# Patient Record
Sex: Female | Born: 1945 | Race: Black or African American | Hispanic: No | Smoking: Former smoker
Health system: Southern US, Community
[De-identification: ages and names within clinical notes are randomized; demographics above are authoritative.]

## PROBLEM LIST (undated history)

## (undated) DIAGNOSIS — F32A Depression, unspecified: Secondary | ICD-10-CM

## (undated) DIAGNOSIS — F329 Major depressive disorder, single episode, unspecified: Secondary | ICD-10-CM

## (undated) DIAGNOSIS — F419 Anxiety disorder, unspecified: Secondary | ICD-10-CM

## (undated) DIAGNOSIS — I10 Essential (primary) hypertension: Secondary | ICD-10-CM

## (undated) DIAGNOSIS — M1990 Unspecified osteoarthritis, unspecified site: Secondary | ICD-10-CM

## (undated) DIAGNOSIS — G459 Transient cerebral ischemic attack, unspecified: Secondary | ICD-10-CM

## (undated) HISTORY — DX: Transient cerebral ischemic attack, unspecified: G45.9

## (undated) HISTORY — PX: EYE SURGERY: SHX253

## (undated) HISTORY — DX: Essential (primary) hypertension: I10

## (undated) HISTORY — PX: OTHER SURGICAL HISTORY: SHX169

## (undated) HISTORY — DX: Morbid (severe) obesity due to excess calories: E66.01

## (undated) HISTORY — PX: ORIF ANKLE FRACTURE: SUR919

## (undated) HISTORY — DX: Unspecified osteoarthritis, unspecified site: M19.90

## (undated) HISTORY — DX: Major depressive disorder, single episode, unspecified: F32.9

## (undated) HISTORY — DX: Depression, unspecified: F32.A

---

## 1999-04-19 ENCOUNTER — Inpatient Hospital Stay (HOSPITAL_COMMUNITY): Admission: EM | Admit: 1999-04-19 | Discharge: 1999-04-22 | Payer: Self-pay | Admitting: Emergency Medicine

## 1999-04-20 ENCOUNTER — Encounter: Payer: Self-pay | Admitting: Internal Medicine

## 2001-05-21 ENCOUNTER — Inpatient Hospital Stay (HOSPITAL_COMMUNITY): Admission: EM | Admit: 2001-05-21 | Discharge: 2001-05-23 | Payer: Self-pay | Admitting: *Deleted

## 2001-05-21 ENCOUNTER — Encounter: Payer: Self-pay | Admitting: Internal Medicine

## 2001-05-23 ENCOUNTER — Encounter: Payer: Self-pay | Admitting: Cardiology

## 2002-05-11 ENCOUNTER — Emergency Department (HOSPITAL_COMMUNITY): Admission: EM | Admit: 2002-05-11 | Discharge: 2002-05-11 | Payer: Self-pay | Admitting: Emergency Medicine

## 2002-05-12 ENCOUNTER — Encounter: Payer: Self-pay | Admitting: Emergency Medicine

## 2003-02-06 ENCOUNTER — Encounter: Payer: Self-pay | Admitting: Family Medicine

## 2003-02-06 ENCOUNTER — Ambulatory Visit (HOSPITAL_COMMUNITY): Admission: RE | Admit: 2003-02-06 | Discharge: 2003-02-06 | Payer: Self-pay | Admitting: Family Medicine

## 2003-07-01 ENCOUNTER — Emergency Department (HOSPITAL_COMMUNITY): Admission: EM | Admit: 2003-07-01 | Discharge: 2003-07-01 | Payer: Self-pay | Admitting: *Deleted

## 2004-08-30 ENCOUNTER — Ambulatory Visit: Payer: Self-pay | Admitting: *Deleted

## 2004-09-13 ENCOUNTER — Inpatient Hospital Stay (HOSPITAL_COMMUNITY): Admission: EM | Admit: 2004-09-13 | Discharge: 2004-09-14 | Payer: Self-pay | Admitting: Emergency Medicine

## 2004-09-17 ENCOUNTER — Ambulatory Visit: Payer: Self-pay | Admitting: Family Medicine

## 2005-03-01 ENCOUNTER — Ambulatory Visit: Payer: Self-pay | Admitting: Family Medicine

## 2005-03-22 ENCOUNTER — Ambulatory Visit: Payer: Self-pay | Admitting: Family Medicine

## 2005-03-23 ENCOUNTER — Emergency Department (HOSPITAL_COMMUNITY): Admission: EM | Admit: 2005-03-23 | Discharge: 2005-03-23 | Payer: Self-pay | Admitting: Emergency Medicine

## 2005-06-21 ENCOUNTER — Ambulatory Visit: Payer: Self-pay | Admitting: Family Medicine

## 2005-07-21 ENCOUNTER — Ambulatory Visit: Payer: Self-pay | Admitting: Family Medicine

## 2005-09-25 ENCOUNTER — Emergency Department (HOSPITAL_COMMUNITY): Admission: EM | Admit: 2005-09-25 | Discharge: 2005-09-26 | Payer: Self-pay | Admitting: Emergency Medicine

## 2005-10-14 ENCOUNTER — Ambulatory Visit: Payer: Self-pay | Admitting: Family Medicine

## 2005-10-26 ENCOUNTER — Ambulatory Visit: Payer: Self-pay | Admitting: Family Medicine

## 2006-04-22 ENCOUNTER — Emergency Department (HOSPITAL_COMMUNITY): Admission: EM | Admit: 2006-04-22 | Discharge: 2006-04-22 | Payer: Self-pay | Admitting: Emergency Medicine

## 2006-08-02 ENCOUNTER — Ambulatory Visit: Payer: Self-pay | Admitting: Cardiology

## 2006-08-02 ENCOUNTER — Encounter: Payer: Self-pay | Admitting: Cardiology

## 2006-08-02 ENCOUNTER — Inpatient Hospital Stay (HOSPITAL_COMMUNITY): Admission: EM | Admit: 2006-08-02 | Discharge: 2006-08-05 | Payer: Self-pay | Admitting: Emergency Medicine

## 2006-09-14 ENCOUNTER — Ambulatory Visit: Payer: Self-pay | Admitting: Internal Medicine

## 2006-09-14 ENCOUNTER — Encounter (INDEPENDENT_AMBULATORY_CARE_PROVIDER_SITE_OTHER): Payer: Self-pay | Admitting: Unknown Physician Specialty

## 2006-09-14 LAB — CONVERTED CEMR LAB
BUN: 7 mg/dL (ref 6–23)
CO2: 25 meq/L (ref 19–32)
Calcium: 9.3 mg/dL (ref 8.4–10.5)
Chloride: 100 meq/L (ref 96–112)
Creatinine, Ser: 0.7 mg/dL (ref 0.40–1.20)
Glucose, Bld: 124 mg/dL — ABNORMAL HIGH (ref 70–99)
Magnesium: 1.5 mg/dL (ref 1.5–2.5)
Potassium: 3.8 meq/L (ref 3.5–5.3)
Sodium: 142 meq/L (ref 135–145)

## 2006-09-27 ENCOUNTER — Encounter (INDEPENDENT_AMBULATORY_CARE_PROVIDER_SITE_OTHER): Payer: Self-pay | Admitting: Unknown Physician Specialty

## 2006-09-27 ENCOUNTER — Ambulatory Visit: Payer: Self-pay | Admitting: Hospitalist

## 2006-09-27 LAB — CONVERTED CEMR LAB
BUN: 11 mg/dL (ref 6–23)
CO2: 31 meq/L (ref 19–32)
Calcium: 9.3 mg/dL (ref 8.4–10.5)
Chloride: 100 meq/L (ref 96–112)
Creatinine, Ser: 0.64 mg/dL (ref 0.40–1.20)
Creatinine, Urine: 134.9 mg/dL
Ferritin: 47 ng/mL (ref 10–291)
Glucose, Bld: 96 mg/dL (ref 70–99)
Iron: 101 ug/dL (ref 42–145)
Microalb Creat Ratio: 8.4 mg/g (ref 0.0–30.0)
Microalb, Ur: 1.13 mg/dL (ref 0.00–1.89)
Potassium: 3.9 meq/L (ref 3.5–5.3)
Saturation Ratios: 31 % (ref 20–55)
Sodium: 140 meq/L (ref 135–145)
TIBC: 321 ug/dL (ref 250–470)
UIBC: 220 ug/dL

## 2006-12-27 ENCOUNTER — Encounter (INDEPENDENT_AMBULATORY_CARE_PROVIDER_SITE_OTHER): Payer: Self-pay | Admitting: Unknown Physician Specialty

## 2006-12-27 ENCOUNTER — Ambulatory Visit: Payer: Self-pay | Admitting: Internal Medicine

## 2006-12-27 DIAGNOSIS — E119 Type 2 diabetes mellitus without complications: Secondary | ICD-10-CM | POA: Insufficient documentation

## 2006-12-27 DIAGNOSIS — Z6841 Body Mass Index (BMI) 40.0 and over, adult: Secondary | ICD-10-CM

## 2006-12-27 DIAGNOSIS — E118 Type 2 diabetes mellitus with unspecified complications: Secondary | ICD-10-CM | POA: Insufficient documentation

## 2006-12-27 DIAGNOSIS — I428 Other cardiomyopathies: Secondary | ICD-10-CM | POA: Insufficient documentation

## 2006-12-27 LAB — CONVERTED CEMR LAB
ALT: 13 units/L (ref 0–35)
AST: 17 units/L (ref 0–37)
Albumin: 4.5 g/dL (ref 3.5–5.2)
Aldosterone, Serum: 11
Alkaline Phosphatase: 45 units/L (ref 39–117)
Amphetamine Screen, Ur: NEGATIVE
BUN: 11 mg/dL (ref 6–23)
Barbiturate Quant, Ur: NEGATIVE
Benzodiazepines.: NEGATIVE
CO2: 24 meq/L (ref 19–32)
Calcium: 9.4 mg/dL (ref 8.4–10.5)
Chloride: 100 meq/L (ref 96–112)
Cholesterol: 211 mg/dL — ABNORMAL HIGH (ref 0–200)
Cocaine Metabolites: NEGATIVE
Creatinine, Ser: 0.63 mg/dL (ref 0.40–1.20)
Creatinine,U: 107.2 mg/dL
Glucose, Bld: 152 mg/dL
Glucose, Bld: 135 mg/dL — ABNORMAL HIGH (ref 70–99)
HDL: 104 mg/dL (ref >39)
Hgb A1c MFr Bld: 7 %
LDL Cholesterol: 62 mg/dL (ref 0–99)
Magnesium: 1.6 mg/dL (ref 1.5–2.5)
Marijuana Metabolite: NEGATIVE
Methadone: NEGATIVE
Opiates: NEGATIVE
PRA: 0.2
Phencyclidine (PCP): NEGATIVE
Potassium: 3.6 meq/L (ref 3.5–5.3)
Propoxyphene: NEGATIVE
Sodium: 141 meq/L (ref 135–145)
Total Bilirubin: 0.7 mg/dL (ref 0.3–1.2)
Total CHOL/HDL Ratio: 2
Total Protein: 7.7 g/dL (ref 6.0–8.3)
Triglycerides: 226 mg/dL — ABNORMAL HIGH (ref <150)
VLDL: 45 mg/dL — ABNORMAL HIGH (ref 0–40)

## 2006-12-28 ENCOUNTER — Telehealth: Payer: Self-pay | Admitting: *Deleted

## 2007-02-08 ENCOUNTER — Telehealth: Payer: Self-pay | Admitting: *Deleted

## 2007-02-12 ENCOUNTER — Ambulatory Visit: Payer: Self-pay | Admitting: Hospitalist

## 2007-02-12 ENCOUNTER — Encounter (INDEPENDENT_AMBULATORY_CARE_PROVIDER_SITE_OTHER): Payer: Self-pay | Admitting: Unknown Physician Specialty

## 2007-02-12 LAB — CONVERTED CEMR LAB
Blood Glucose, Fingerstick: 101
Testosterone: 61.87 ng/dL (ref 10–70)

## 2007-05-09 ENCOUNTER — Telehealth (INDEPENDENT_AMBULATORY_CARE_PROVIDER_SITE_OTHER): Payer: Self-pay | Admitting: *Deleted

## 2007-05-11 ENCOUNTER — Emergency Department (HOSPITAL_COMMUNITY): Admission: EM | Admit: 2007-05-11 | Discharge: 2007-05-12 | Payer: Self-pay | Admitting: Emergency Medicine

## 2007-05-14 ENCOUNTER — Ambulatory Visit: Payer: Self-pay | Admitting: Hospitalist

## 2007-05-14 ENCOUNTER — Encounter (INDEPENDENT_AMBULATORY_CARE_PROVIDER_SITE_OTHER): Payer: Self-pay | Admitting: *Deleted

## 2007-05-14 LAB — CONVERTED CEMR LAB
Blood Glucose, Fingerstick: 210
Hgb A1c MFr Bld: 7 %
TSH: 1.435 microintl units/mL (ref 0.350–5.50)

## 2007-06-06 ENCOUNTER — Telehealth (INDEPENDENT_AMBULATORY_CARE_PROVIDER_SITE_OTHER): Payer: Self-pay | Admitting: Pharmacy Technician

## 2007-06-06 ENCOUNTER — Encounter (INDEPENDENT_AMBULATORY_CARE_PROVIDER_SITE_OTHER): Payer: Self-pay | Admitting: *Deleted

## 2007-06-06 ENCOUNTER — Ambulatory Visit: Payer: Self-pay

## 2007-07-09 ENCOUNTER — Encounter (INDEPENDENT_AMBULATORY_CARE_PROVIDER_SITE_OTHER): Payer: Self-pay | Admitting: *Deleted

## 2007-07-09 ENCOUNTER — Ambulatory Visit: Payer: Self-pay | Admitting: Internal Medicine

## 2007-07-09 LAB — CONVERTED CEMR LAB
ALT: 12 units/L (ref 0–35)
AST: 13 units/L (ref 0–37)
Albumin: 4.5 g/dL (ref 3.5–5.2)
Alkaline Phosphatase: 42 units/L (ref 39–117)
BUN: 16 mg/dL (ref 6–23)
Blood Glucose, Fingerstick: 126
CO2: 26 meq/L (ref 19–32)
Calcium: 10.1 mg/dL (ref 8.4–10.5)
Chloride: 102 meq/L (ref 96–112)
Creatinine, Ser: 0.85 mg/dL (ref 0.40–1.20)
Glucose, Bld: 97 mg/dL (ref 70–99)
Magnesium: 1.9 mg/dL (ref 1.5–2.5)
Potassium: 4.1 meq/L (ref 3.5–5.3)
Sodium: 143 meq/L (ref 135–145)
Total Bilirubin: 0.5 mg/dL (ref 0.3–1.2)
Total Protein: 7.6 g/dL (ref 6.0–8.3)

## 2007-07-13 ENCOUNTER — Ambulatory Visit (HOSPITAL_COMMUNITY): Admission: RE | Admit: 2007-07-13 | Discharge: 2007-07-13 | Payer: Self-pay | Admitting: *Deleted

## 2007-07-17 DIAGNOSIS — F1020 Alcohol dependence, uncomplicated: Secondary | ICD-10-CM | POA: Insufficient documentation

## 2007-08-07 ENCOUNTER — Telehealth: Payer: Self-pay | Admitting: *Deleted

## 2007-09-04 ENCOUNTER — Telehealth: Payer: Self-pay | Admitting: *Deleted

## 2007-09-10 ENCOUNTER — Telehealth: Payer: Self-pay | Admitting: *Deleted

## 2007-11-07 ENCOUNTER — Telehealth: Payer: Self-pay | Admitting: Internal Medicine

## 2008-01-02 ENCOUNTER — Telehealth: Payer: Self-pay | Admitting: Internal Medicine

## 2008-01-03 ENCOUNTER — Emergency Department (HOSPITAL_COMMUNITY): Admission: EM | Admit: 2008-01-03 | Discharge: 2008-01-04 | Payer: Self-pay | Admitting: Emergency Medicine

## 2008-05-21 ENCOUNTER — Encounter (INDEPENDENT_AMBULATORY_CARE_PROVIDER_SITE_OTHER): Payer: Self-pay | Admitting: Internal Medicine

## 2008-05-21 ENCOUNTER — Ambulatory Visit: Payer: Self-pay | Admitting: Internal Medicine

## 2008-05-21 LAB — CONVERTED CEMR LAB
BUN: 12 mg/dL (ref 6–23)
Bilirubin Urine: NEGATIVE
Blood Glucose, Fingerstick: 74
Blood in Urine, dipstick: NEGATIVE
CO2: 26 meq/L (ref 19–32)
Calcium: 9.7 mg/dL (ref 8.4–10.5)
Chloride: 100 meq/L (ref 96–112)
Creatinine, Ser: 0.56 mg/dL (ref 0.40–1.20)
Glucose, Bld: 107 mg/dL — ABNORMAL HIGH (ref 70–99)
Glucose, Urine, Semiquant: NEGATIVE
Hgb A1c MFr Bld: 7.2 %
Ketones, urine, test strip: NEGATIVE
Nitrite: NEGATIVE
Potassium: 4 meq/L (ref 3.5–5.3)
Protein, U semiquant: NEGATIVE
Sodium: 141 meq/L (ref 135–145)
Specific Gravity, Urine: >=1.03
Urobilinogen, UA: 0.2
pH: 6

## 2008-06-24 ENCOUNTER — Telehealth: Payer: Self-pay | Admitting: *Deleted

## 2008-07-20 ENCOUNTER — Emergency Department (HOSPITAL_COMMUNITY): Admission: EM | Admit: 2008-07-20 | Discharge: 2008-07-20 | Payer: Self-pay | Admitting: Emergency Medicine

## 2008-09-22 ENCOUNTER — Telehealth (INDEPENDENT_AMBULATORY_CARE_PROVIDER_SITE_OTHER): Payer: Self-pay | Admitting: Internal Medicine

## 2008-10-13 ENCOUNTER — Ambulatory Visit: Payer: Self-pay | Admitting: Internal Medicine

## 2008-10-13 DIAGNOSIS — M25579 Pain in unspecified ankle and joints of unspecified foot: Secondary | ICD-10-CM | POA: Insufficient documentation

## 2008-10-13 LAB — CONVERTED CEMR LAB
Blood Glucose, Fingerstick: 141
Hgb A1c MFr Bld: 6.6 %

## 2008-10-27 ENCOUNTER — Telehealth: Payer: Self-pay | Admitting: Infectious Diseases

## 2008-11-01 ENCOUNTER — Encounter: Payer: Self-pay | Admitting: Emergency Medicine

## 2008-11-02 ENCOUNTER — Inpatient Hospital Stay (HOSPITAL_COMMUNITY): Admission: EM | Admit: 2008-11-02 | Discharge: 2008-11-03 | Payer: Self-pay | Admitting: Internal Medicine

## 2008-11-02 ENCOUNTER — Ambulatory Visit: Payer: Self-pay | Admitting: Internal Medicine

## 2008-11-02 ENCOUNTER — Encounter: Payer: Self-pay | Admitting: *Deleted

## 2008-11-02 DIAGNOSIS — I498 Other specified cardiac arrhythmias: Secondary | ICD-10-CM | POA: Insufficient documentation

## 2008-11-02 DIAGNOSIS — J209 Acute bronchitis, unspecified: Secondary | ICD-10-CM | POA: Insufficient documentation

## 2008-11-02 DIAGNOSIS — E876 Hypokalemia: Secondary | ICD-10-CM | POA: Insufficient documentation

## 2008-11-04 ENCOUNTER — Encounter (INDEPENDENT_AMBULATORY_CARE_PROVIDER_SITE_OTHER): Payer: Self-pay | Admitting: Internal Medicine

## 2008-11-11 ENCOUNTER — Encounter: Payer: Self-pay | Admitting: *Deleted

## 2008-11-11 LAB — CONVERTED CEMR LAB: LDL Cholesterol: 103 mg/dL

## 2008-11-20 ENCOUNTER — Ambulatory Visit: Payer: Self-pay | Admitting: Infectious Disease

## 2008-11-20 ENCOUNTER — Encounter (INDEPENDENT_AMBULATORY_CARE_PROVIDER_SITE_OTHER): Payer: Self-pay | Admitting: Internal Medicine

## 2008-11-20 ENCOUNTER — Encounter (INDEPENDENT_AMBULATORY_CARE_PROVIDER_SITE_OTHER): Payer: Self-pay | Admitting: *Deleted

## 2008-11-20 DIAGNOSIS — E1169 Type 2 diabetes mellitus with other specified complication: Secondary | ICD-10-CM | POA: Insufficient documentation

## 2008-11-20 DIAGNOSIS — E785 Hyperlipidemia, unspecified: Secondary | ICD-10-CM

## 2008-11-20 LAB — CONVERTED CEMR LAB
BUN: 12 mg/dL (ref 6–23)
CO2: 24 meq/L (ref 19–32)
Calcium: 9.4 mg/dL (ref 8.4–10.5)
Chloride: 101 meq/L (ref 96–112)
Creatinine, Ser: 0.6 mg/dL (ref 0.40–1.20)
Glucose, Bld: 119 mg/dL — ABNORMAL HIGH (ref 70–99)
Potassium: 3.6 meq/L (ref 3.5–5.3)
Sodium: 142 meq/L (ref 135–145)

## 2008-12-05 ENCOUNTER — Telehealth: Payer: Self-pay | Admitting: *Deleted

## 2008-12-16 ENCOUNTER — Ambulatory Visit (HOSPITAL_COMMUNITY): Admission: RE | Admit: 2008-12-16 | Discharge: 2008-12-16 | Payer: Self-pay | Admitting: Infectious Disease

## 2008-12-16 ENCOUNTER — Encounter (INDEPENDENT_AMBULATORY_CARE_PROVIDER_SITE_OTHER): Payer: Self-pay | Admitting: Internal Medicine

## 2008-12-16 ENCOUNTER — Emergency Department (HOSPITAL_COMMUNITY): Admission: EM | Admit: 2008-12-16 | Discharge: 2008-12-16 | Payer: Self-pay | Admitting: Emergency Medicine

## 2008-12-29 ENCOUNTER — Telehealth (INDEPENDENT_AMBULATORY_CARE_PROVIDER_SITE_OTHER): Payer: Self-pay | Admitting: Internal Medicine

## 2008-12-30 ENCOUNTER — Emergency Department (HOSPITAL_COMMUNITY): Admission: EM | Admit: 2008-12-30 | Discharge: 2008-12-30 | Payer: Self-pay | Admitting: Emergency Medicine

## 2009-01-12 ENCOUNTER — Ambulatory Visit: Payer: Self-pay | Admitting: Internal Medicine

## 2009-01-12 ENCOUNTER — Ambulatory Visit (HOSPITAL_COMMUNITY): Admission: RE | Admit: 2009-01-12 | Discharge: 2009-01-12 | Payer: Self-pay | Admitting: Internal Medicine

## 2009-01-12 ENCOUNTER — Encounter (INDEPENDENT_AMBULATORY_CARE_PROVIDER_SITE_OTHER): Payer: Self-pay | Admitting: Internal Medicine

## 2009-01-12 LAB — CONVERTED CEMR LAB
ALT: 11 units/L (ref 0–35)
AST: 12 units/L (ref 0–37)
Albumin: 4.7 g/dL (ref 3.5–5.2)
Alkaline Phosphatase: 39 units/L (ref 39–117)
BUN: 12 mg/dL (ref 6–23)
Blood Glucose, Fingerstick: 152
CO2: 26 meq/L (ref 19–32)
Calcium: 9.6 mg/dL (ref 8.4–10.5)
Chloride: 99 meq/L (ref 96–112)
Cholesterol: 187 mg/dL (ref 0–200)
Creatinine, Ser: 0.66 mg/dL (ref 0.40–1.20)
Glucose, Bld: 147 mg/dL — ABNORMAL HIGH (ref 70–99)
HDL: 84 mg/dL (ref >39)
Hgb A1c MFr Bld: 8.2 %
LDL Cholesterol: 82 mg/dL (ref 0–99)
Potassium: 4 meq/L (ref 3.5–5.3)
Sodium: 142 meq/L (ref 135–145)
Total Bilirubin: 0.5 mg/dL (ref 0.3–1.2)
Total CHOL/HDL Ratio: 2.2
Total Protein: 7.4 g/dL (ref 6.0–8.3)
Triglycerides: 107 mg/dL (ref <150)
VLDL: 21 mg/dL (ref 0–40)

## 2009-01-14 ENCOUNTER — Encounter (INDEPENDENT_AMBULATORY_CARE_PROVIDER_SITE_OTHER): Payer: Self-pay | Admitting: Internal Medicine

## 2009-01-20 DIAGNOSIS — H409 Unspecified glaucoma: Secondary | ICD-10-CM | POA: Insufficient documentation

## 2009-02-05 ENCOUNTER — Telehealth (INDEPENDENT_AMBULATORY_CARE_PROVIDER_SITE_OTHER): Payer: Self-pay | Admitting: Internal Medicine

## 2009-02-26 ENCOUNTER — Encounter (INDEPENDENT_AMBULATORY_CARE_PROVIDER_SITE_OTHER): Payer: Self-pay | Admitting: Internal Medicine

## 2009-03-20 ENCOUNTER — Encounter (INDEPENDENT_AMBULATORY_CARE_PROVIDER_SITE_OTHER): Payer: Self-pay | Admitting: Internal Medicine

## 2009-04-08 ENCOUNTER — Encounter (INDEPENDENT_AMBULATORY_CARE_PROVIDER_SITE_OTHER): Payer: Self-pay | Admitting: Internal Medicine

## 2009-04-09 ENCOUNTER — Emergency Department (HOSPITAL_COMMUNITY): Admission: EM | Admit: 2009-04-09 | Discharge: 2009-04-09 | Payer: Self-pay | Admitting: Emergency Medicine

## 2009-05-09 ENCOUNTER — Emergency Department (HOSPITAL_COMMUNITY): Admission: EM | Admit: 2009-05-09 | Discharge: 2009-05-09 | Payer: Self-pay | Admitting: Emergency Medicine

## 2009-05-15 ENCOUNTER — Telehealth (INDEPENDENT_AMBULATORY_CARE_PROVIDER_SITE_OTHER): Payer: Self-pay | Admitting: Internal Medicine

## 2009-06-02 ENCOUNTER — Telehealth (INDEPENDENT_AMBULATORY_CARE_PROVIDER_SITE_OTHER): Payer: Self-pay | Admitting: Internal Medicine

## 2009-10-19 ENCOUNTER — Telehealth (INDEPENDENT_AMBULATORY_CARE_PROVIDER_SITE_OTHER): Payer: Self-pay | Admitting: Internal Medicine

## 2009-10-19 ENCOUNTER — Ambulatory Visit: Payer: Self-pay | Admitting: Internal Medicine

## 2009-10-19 LAB — CONVERTED CEMR LAB
Blood Glucose, Fingerstick: 80
Hgb A1c MFr Bld: 5.8 %

## 2010-02-23 ENCOUNTER — Telehealth (INDEPENDENT_AMBULATORY_CARE_PROVIDER_SITE_OTHER): Payer: Self-pay | Admitting: Internal Medicine

## 2010-03-30 ENCOUNTER — Telehealth (INDEPENDENT_AMBULATORY_CARE_PROVIDER_SITE_OTHER): Payer: Self-pay | Admitting: Internal Medicine

## 2010-04-04 ENCOUNTER — Ambulatory Visit: Payer: Self-pay | Admitting: Internal Medicine

## 2010-04-04 ENCOUNTER — Encounter: Payer: Self-pay | Admitting: Emergency Medicine

## 2010-04-04 ENCOUNTER — Encounter: Payer: Self-pay | Admitting: Internal Medicine

## 2010-04-04 ENCOUNTER — Ambulatory Visit: Payer: Self-pay | Admitting: Cardiovascular Disease

## 2010-04-04 ENCOUNTER — Inpatient Hospital Stay (HOSPITAL_COMMUNITY): Admission: EM | Admit: 2010-04-04 | Discharge: 2010-04-08 | Payer: Self-pay | Admitting: Internal Medicine

## 2010-04-05 ENCOUNTER — Encounter: Payer: Self-pay | Admitting: Internal Medicine

## 2010-04-05 LAB — CONVERTED CEMR LAB
Cholesterol: 182 mg/dL
HDL: 98 mg/dL
LDL Cholesterol: 73 mg/dL
Triglycerides: 54 mg/dL

## 2010-04-06 ENCOUNTER — Encounter: Payer: Self-pay | Admitting: Internal Medicine

## 2010-04-06 DIAGNOSIS — F341 Dysthymic disorder: Secondary | ICD-10-CM | POA: Insufficient documentation

## 2010-04-07 ENCOUNTER — Ambulatory Visit: Payer: Self-pay | Admitting: Surgery

## 2010-04-07 ENCOUNTER — Encounter: Payer: Self-pay | Admitting: Internal Medicine

## 2010-04-30 ENCOUNTER — Ambulatory Visit: Payer: Self-pay | Admitting: Internal Medicine

## 2010-04-30 LAB — CONVERTED CEMR LAB
BUN: 12 mg/dL (ref 6–23)
Blood Glucose, Fingerstick: 137
CO2: 28 meq/L (ref 19–32)
Calcium: 10.5 mg/dL (ref 8.4–10.5)
Chloride: 100 meq/L (ref 96–112)
Creatinine, Ser: 0.5 mg/dL (ref 0.40–1.20)
Glucose, Bld: 72 mg/dL (ref 70–99)
Potassium: 4.2 meq/L (ref 3.5–5.3)
Sodium: 141 meq/L (ref 135–145)

## 2010-05-12 ENCOUNTER — Ambulatory Visit: Payer: Self-pay | Admitting: Internal Medicine

## 2010-05-12 DIAGNOSIS — I471 Supraventricular tachycardia: Secondary | ICD-10-CM | POA: Insufficient documentation

## 2010-05-12 DIAGNOSIS — I152 Hypertension secondary to endocrine disorders: Secondary | ICD-10-CM | POA: Insufficient documentation

## 2010-05-12 DIAGNOSIS — E1159 Type 2 diabetes mellitus with other circulatory complications: Secondary | ICD-10-CM | POA: Insufficient documentation

## 2010-05-12 DIAGNOSIS — I10 Essential (primary) hypertension: Secondary | ICD-10-CM

## 2010-06-05 ENCOUNTER — Emergency Department (HOSPITAL_COMMUNITY): Admission: EM | Admit: 2010-06-05 | Discharge: 2010-06-05 | Payer: Self-pay | Admitting: Emergency Medicine

## 2010-06-05 ENCOUNTER — Ambulatory Visit (HOSPITAL_COMMUNITY): Admission: RE | Admit: 2010-06-05 | Discharge: 2010-06-05 | Payer: Self-pay | Admitting: Emergency Medicine

## 2010-06-25 ENCOUNTER — Encounter: Payer: Self-pay | Admitting: Internal Medicine

## 2010-07-26 LAB — HM DIABETES EYE EXAM: HM Diabetic Eye Exam: NORMAL

## 2010-08-04 ENCOUNTER — Telehealth: Payer: Self-pay | Admitting: Internal Medicine

## 2010-09-21 ENCOUNTER — Ambulatory Visit: Payer: Self-pay | Admitting: Internal Medicine

## 2010-09-21 LAB — HM DIABETES FOOT EXAM

## 2010-09-21 LAB — CONVERTED CEMR LAB
Blood Glucose, Fingerstick: 49
Creatinine, Urine: 90.6 mg/dL
Hgb A1c MFr Bld: 6.7 %
Microalb Creat Ratio: 7.7 mg/g (ref 0.0–30.0)
Microalb, Ur: 0.7 mg/dL (ref 0.00–1.89)

## 2010-09-30 ENCOUNTER — Encounter: Payer: Self-pay | Admitting: Internal Medicine

## 2010-10-04 ENCOUNTER — Ambulatory Visit (HOSPITAL_COMMUNITY)
Admission: RE | Admit: 2010-10-04 | Discharge: 2010-10-04 | Payer: Self-pay | Source: Home / Self Care | Admitting: Internal Medicine

## 2010-10-04 LAB — HM MAMMOGRAPHY

## 2010-10-05 ENCOUNTER — Encounter: Payer: Self-pay | Admitting: Internal Medicine

## 2010-10-22 ENCOUNTER — Ambulatory Visit: Payer: Self-pay | Admitting: Internal Medicine

## 2010-10-22 ENCOUNTER — Encounter: Payer: Self-pay | Admitting: Internal Medicine

## 2010-10-27 ENCOUNTER — Telehealth (INDEPENDENT_AMBULATORY_CARE_PROVIDER_SITE_OTHER): Payer: Self-pay | Admitting: *Deleted

## 2010-11-21 ENCOUNTER — Encounter: Payer: Self-pay | Admitting: Internal Medicine

## 2010-12-02 NOTE — Progress Notes (Signed)
Summary: med refill/gp  Phone Note Refill Request Message from:  Fax from Pharmacy on May 15, 2009 11:34 AM  Refills Requested: Medication #1:  HYDROCHLOROTHIAZIDE 25 MG TABS Take 1 tablet by mouth once a day   Last Refilled: 04/09/2009  Method Requested: Electronic Initial call taken by: Chinita Pester RN,  May 15, 2009 11:34 AM    Prescriptions: HYDROCHLOROTHIAZIDE 25 MG TABS (HYDROCHLOROTHIAZIDE) Take 1 tablet by mouth once a day  #30 x 6   Entered and Authorized by:   Elby Showers MD   Signed by:   Elby Showers MD on 05/16/2009   Method used:   Electronically to        [facility] [phone]* (retail)       [address]       [place], [place]  [address]       Ph: [phone]       Fax: [phone]   RxID:   [id number]

## 2010-12-02 NOTE — Miscellaneous (Signed)
  Clinical Lists Changes  Observations: Added new observation of DMEYEEXAMNXT: 08/2011 (09/30/2010 8:48) Added new observation of DIAB EYE EX: Normal exam. No retinopathy. (07/26/2010 8:49)      Diabetic Eye Exam  Procedure date:  07/26/2010  Findings:      Normal exam. No retinopathy.  Procedures Next Due Date:    Diabetic Eye Exam: 08/2011

## 2010-12-02 NOTE — Miscellaneous (Signed)
Summary: HIPPA  HIPPA   Imported By: Gentry Fitz 11/20/2008 15:48:15  _____________________________________________________________________  External Attachment:    Type:   Image     Comment:   External Document

## 2010-12-02 NOTE — Letter (Signed)
Summary: Handout Printed  Printed Handout:  - *Patient Instructions 

## 2010-12-02 NOTE — Miscellaneous (Signed)
  Clinical Lists Changes  Observations: Added new observation of MAMMO DUE: 10/2011 (10/05/2010 10:58) Added new observation of MAMMOGRAM: BI RADS 1.  (10/04/2010 10:58)      Mammogram  Procedure date:  10/04/2010  Findings:      BI RADS 1.   Procedures Next Due Date:    Mammogram: 10/2011

## 2010-12-02 NOTE — Assessment & Plan Note (Signed)
Summary: est-ck/fu/meds/cfb   Vital Signs:  Patient profile:   65 year old female Height:      62 inches (157.48 cm) Weight:      247.3 pounds (112.41 kg) BMI:     45.40 Temp:     97.9 degrees F (36.61 degrees C) oral Pulse rate:   88 / minute BP sitting:   177 / 112  (left arm)  Vitals Entered By: Stanton Kidney Ditzler RN (October 19, 2009 4:17 PM) Is Patient Diabetic? Yes Did you bring your meter with you today? No Pain Assessment Patient in pain? no      Nutritional Status BMI of > 30 = obese Nutritional Status Detail appetite good CBG Result 80  Have you ever been in a relationship where you felt threatened, hurt or afraid?denies   Does patient need assistance? Functional Status Self care Ambulation Normal Comments Ck-upBP reck 4:55PM 179/120 - 95 left arm.   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 2D echo in 3/07 showed Hypokinesis of apical aspect of inferior septum and mid apical inferior wall. AOCD - Ferritin 47, Iron 101, %saturation 31  She is here for a check up.  She has no complaints.  Her BP is very elvated today, she thinks this may be due to christmas stress.  No HA or vision change, no dizzyness, no chest pain.  Has recently started taking B12 500mg  to boost her energy.      Depression History:      The patient denies a depressed mood most of the day and a diminished interest in her usual daily activities.         Preventive Screening-Counseling & Management  Alcohol-Tobacco     Alcohol drinks/day: <1     Alcohol type: beer/wine     Smoking Status: quit     Smoking Cessation Counseling: yes     Packs/Day: Occ     Year Quit: 2010, approx Jan     Pack years: 27  Caffeine-Diet-Exercise     Does Patient Exercise: yes  Current Medications (verified): 1)   Catapres 0.1 Mg Tabs (Clonidine Hcl) .... Take 1 Tablet By Mouth  Twice A Day 2)  Hydrochlorothiazide 25 Mg Tabs (Hydrochlorothiazide) .... Take 1 Tablet By Mouth Once A Day 3)  Glipizide-Metformin Hcl 5-500 Mg Tabs (Glipizide-Metformin Hcl) .... Take 2 Tablets Twice A Day. 4)  Aspir-Low 81 Mg Tbec (Aspirin) .... Take 1 Tablet By Mouth Once A Day 5)  Norvasc 10 Mg  Tabs (Amlodipine Besylate) .... Take Once A Day For Blood Pressure  Allergies (verified): No Known Drug Allergies  Review of Systems       per hpi  Physical Exam  General:  alert and overweight-appearing.   Head:  normocephalic and atraumatic.   Eyes:  vision grossly intact.  right pupil distorted and nonconstricting.  left pupil is normal. Nose:  no external deformity.   Mouth:  pharynx pink and moist, poor dentition, and teeth missing.   Lungs:  normal respiratory effort and normal breath sounds.   Heart:  normal rate, regular rhythm, no murmur, and no gallop.   Pulses:  2+ Extremities:  no edema Neurologic:  alert & oriented X3, cranial nerves II-XII intact, strength normal in all extremities, sensation intact to pinprick, and gait normal.   Skin:  no suspicious lesions.   Cervical Nodes:  no anterior cervical adenopathy and no posterior cervical adenopathy.   Psych:  Oriented X3, memory intact for recent and remote, normally interactive, and good eye contact.    Diabetes Management Exam:    Foot Exam (with socks and/or shoes not present):       Sensory-Monofilament:          Left foot: normal          Right foot: normal   Impression & Recommendations:  Problem # 1:  Hx of ESSENTIAL HYPERTENSION (ICD-401.9) She reports taking her medications as prescribed.  BP very high today.   on recheck is the same. Will add lisinopril back to her regimen.  This was stopped in the past because of cough, which resolved.  She knows that if she developes cough again, she should stop the medicaiton and call so we can switch to an  ARB. rtc in 2 weeks for recheck.  Her updated medication list for this problem includes:    Catapres 0.1 Mg Tabs (Clonidine hcl) .Marland Kitchen... Take 1 tablet by mouth  twice a day    Hydrochlorothiazide 25 Mg Tabs (Hydrochlorothiazide) .Marland Kitchen... Take 1 tablet by mouth once a day    Norvasc 10 Mg Tabs (Amlodipine besylate) .Marland Kitchen... Take once a day for blood pressure    Lisinopril 20 Mg Tabs (Lisinopril) .Marland Kitchen... Take one tablet daily for blood pressure.  BP today: 177/112 Prior BP: 149/109 (01/12/2009)  Prior 10 Yr Risk Heart Disease: 20 % (02/12/2007)  Labs Reviewed: K+: 4.0 (01/12/2009) Creat: : 0.66 (01/12/2009)   Chol: 187 (01/12/2009)   HDL: 84 (01/12/2009)   LDL: 82 (01/12/2009)   TG: 107 (01/12/2009)  Problem # 2:  DM (ICD-250.00) A1C is great at 5.8.  will actually decrease the amout of glipizide/metformin and recheck in 3 months.  Her updated medication list for this problem includes:    Glipizide-metformin Hcl 5-500 Mg Tabs (Glipizide-metformin hcl) .Marland Kitchen... Take one tablet two times a day.    Aspir-low 81 Mg Tbec (Aspirin) .Marland Kitchen... Take 1 tablet by mouth once a day    Lisinopril 20 Mg Tabs (Lisinopril) .Marland Kitchen... Take one tablet daily for blood pressure.  Orders: T- Capillary Blood Glucose (82948) T-Hgb A1C (in-house) ([id number]) T-Urine Microalbumin w/creat. ratio [phone])  Labs Reviewed: Creat: 0.66 (01/12/2009)     Last Eye Exam: No diabetic retinopathy OU.   Glaucoma Suspect.   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  (01/07/2009) Reviewed HgBA1c results: 5.8 (10/19/2009)  8.2 (01/12/2009)  Problem # 3:  HYPERLIPIDEMIA (ICD-272.4) REcheck lipids and cmet at next visit.  The following medications were removed from the medication list:    Pravastatin Sodium 20 Mg Tabs (Pravastatin sodium) ..... One by mouth at bedtime  Labs Reviewed: SGOT: 12 (01/12/2009)   SGPT:  11 (01/12/2009)  Prior 10 Yr Risk Heart Disease: 20 % (02/12/2007)   HDL:84 (01/12/2009), 104 (36/64/4034)  LDL:82 (01/12/2009), 103  --  11/02/2008 (11/11/2008)  Chol:187 (01/12/2009), 211 (12/27/2006)  Trig:107 (01/12/2009), 226 (12/27/2006)  Complete Medication List: 1)  Catapres 0.1 Mg Tabs (Clonidine hcl) .... Take 1 tablet by mouth  twice a day 2)  Hydrochlorothiazide 25 Mg Tabs (Hydrochlorothiazide) .... Take 1 tablet by mouth once a day 3)  Glipizide-metformin Hcl 5-500 Mg Tabs (Glipizide-metformin hcl) .Marland KitchenMarland KitchenMarland Kitchen  Take one tablet two times a day. 4)  Aspir-low 81 Mg Tbec (Aspirin) .... Take 1 tablet by mouth once a day 5)  Norvasc 10 Mg Tabs (Amlodipine besylate) .... Take once a day for blood pressure 6)  Lisinopril 20 Mg Tabs (Lisinopril) .... Take one tablet daily for blood pressure.  Patient Instructions: 1)  Please schedule a follow-up appointment in 2 weeks. 2)  You have a new prescription for lisinopril for blood pressure. 3)  You can take one tablet of the metformin-glipizide two times a day. 4)  Limit your Sodium (Salt) to less than 4 grams a day (slightly less than 1 teaspoon) to prevent fluid retention, swelling, or worsening or symptoms. Prescriptions: GLIPIZIDE-METFORMIN HCL 5-500 MG TABS (GLIPIZIDE-METFORMIN HCL) Take one tablet two times a day.  #64 x 3   Entered and Authorized by:   Elby Showers MD   Signed by:   Elby Showers MD on 10/20/2009   Method used:   Electronically to        [facility] [phone]* (retail)       [address]       [place], [place]  [address]       Ph: [phone]       Fax: [phone]   RxID:   [id number] NORVASC 10 MG  TABS (AMLODIPINE BESYLATE) Take once a day for blood pressure  #32 x 3   Entered and Authorized by:   Elby Showers MD   Signed by:   Elby Showers MD on 10/19/2009   Method used:   Electronically to        [facility] [phone]* (retail)       [address]       [place], [place]  [address]        Ph: [phone]       Fax: [phone]   RxID:   [id number] HYDROCHLOROTHIAZIDE 25 MG TABS (HYDROCHLOROTHIAZIDE) Take 1 tablet by mouth once a day  #30 x 6   Entered and Authorized by:   Elby Showers MD   Signed by:   Elby Showers MD on 10/19/2009   Method used:   Electronically to        [facility] [phone]* (retail)       [address]       [place], [place]  [address]       Ph: [phone]       Fax: [phone]   RxID:   [id number] CATAPRES 0.1 MG TABS (CLONIDINE HCL) Take 1 tablet by mouth  twice a day  #64 x 5   Entered and Authorized by:   Elby Showers MD   Signed by:   Elby Showers MD on 10/19/2009   Method used:   Electronically to        [facility] [phone]* (retail)       [address]       [place], [place]  [address]       Ph: [phone]       Fax: [phone]   RxID:   [id number] LISINOPRIL 20 MG TABS (LISINOPRIL) Take one tablet daily for blood pressure.  #32 x 0   Entered and Authorized by:   Elby Showers MD   Signed by:   Elby Showers MD on 10/19/2009   Method used:   Electronically to        [facility] [phone]* (retail)       [address]  [place], [place]  [address]       Ph: [phone]       Fax: [phone]   RxID:   [id number]  Process Orders Check Orders Results:     [facility]: Order checked:     Elby Showers MD NOT AUTHORIZED TO ORDER Tests Sent for requisitioning (October 20, 2009 12:29 PM):     10/19/2009: [facility] -- T-Urine Microalbumin w/creat. ratio [[id number]] (signed)    Prevention & Chronic Care Immunizations   Influenza vaccine: Not documented    Tetanus booster: Not documented    Pneumococcal vaccine: Not documented    H. zoster vaccine: Not documented  Colorectal Screening   Hemoccult: Not documented    Colonoscopy: Not documented  Other Screening   Pap smear: Not documented    Mammogram: Not  documented    DXA bone density scan: Not documented   Smoking status: quit  (10/19/2009)  Diabetes Mellitus   HgbA1C: 5.8  (10/19/2009)    Eye exam: No diabetic retinopathy OU.   Glaucoma Suspect.   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   (01/07/2009)   Eye exam due: 02/2010    Foot exam: yes  (10/19/2009)   High risk foot: Not documented   Foot care education: Not documented    Urine microalbumin/creatinine ratio: 8.4  (09/27/2006)   Urine microalbumin action/deferral: Ordered    Diabetes flowsheet reviewed?: Yes   Progress toward A1C goal: Improved  Lipids   Total Cholesterol: 187  (01/12/2009)   LDL: 82  (01/12/2009)   LDL Direct: Not documented   HDL: 84  (01/12/2009)   Triglycerides: 107  (01/12/2009)    SGOT (AST): 12  (01/12/2009)   SGPT (ALT): 11  (01/12/2009)   Alkaline phosphatase: 39  (01/12/2009)   Total bilirubin: 0.5  (01/12/2009)    Lipid flowsheet reviewed?: Yes   Progress toward LDL goal: Unchanged  Hypertension   Last Blood Pressure: 177 / 112  (10/19/2009)   Serum creatinine: 0.66  (01/12/2009)   Serum potassium 4.0  (01/12/2009)    Hypertension flowsheet reviewed?: Yes   Progress toward BP goal: Deteriorated  Self-Management Support :    Patient will work on the following items until the next clinic visit to reach self-care goals:     Medications and monitoring: take my medicines every day, check my blood sugar, bring all of my medications to every visit, examine my feet every day  (10/19/2009)     Eating: drink diet soda or water instead of juice or soda, eat more vegetables, use fresh or frozen vegetables, eat foods that are low in salt, eat fruit for snacks and desserts  (10/19/2009)    Diabetes self-management support: Not documented    Hypertension self-management support: Not documented    Lipid self-management  support: Not documented    Laboratory Results   Blood Tests   Date/Time Received: October 19, 2009 4:26 PM Date/Time Reported: Alric Quan  October 19, 2009 4:26 PM  HGBA1C: 5.8%   (Normal Range: Non-Diabetic - 3-6%   Control Diabetic - 6-8%) CBG Random:: 80mg /dL       Last LDL:  82 (01/12/2009 9:04:00 PM)        Diabetic Foot Exam Foot Inspection Is there a history of a foot ulcer?              No Is there a foot ulcer now?              No Can the patient see the bottom of their feet?          Yes Are the shoes appropriate in style and fit?          Yes Is there swelling or an abnormal foot shape?          No Are the toenails long?                No Are the toenails thick?                No Are the toenails ingrown?              No Is there heavy callous build-up?              No Is there a claw toe deformity?                          No Is there elevated skin temperature?            No Is there limited ankle dorsiflexion?            No Is there foot or ankle muscle weakness?            No Do you have pain in calf while walking?           No         10-g (5.07) Semmes-Weinstein Monofilament Test Performed by: Stanton Kidney Ditzler RN          Right Foot          Left Foot Visual Inspection     normal         normal Test Control      normal         normal Site 1         normal         normal Site 2         normal         normal Site 3         normal         normal Site 4         normal         normal Site 5         normal         normal Site 6         normal         normal Site 7         normal         normal Site 8         normal         normal Site 9         normal         normal Site 10         normal         normal  Impression      normal         normal

## 2010-12-02 NOTE — Miscellaneous (Signed)
  Clinical Lists Changes  Medications: Removed medication of IBUPROFEN 600 MG TABS (IBUPROFEN) Take one tablet three times a day for ankle pain for one week. Added new medication of DOXYCYCLINE MONOHYDRATE 100 MG CAPS (DOXYCYCLINE MONOHYDRATE) Take 1 tablet by mouth two times a day for 10 days Added new medication of PREDNISONE 10 MG TABS (PREDNISONE) Six day taper starting 11/04/2008  Date of admission: 11/02/2008 Date of discharge: 11/03/2008  Reason for admission: Bronchitis, hypokalemia.   Follow up with Dr. Janyth Pupa. Thursday Jan 21. Patient needs to be reviewed for resolution of wheezing and need for long-term bronchodilator/inhaled steroid. A b-met is recommmended to check her K.

## 2010-12-02 NOTE — Progress Notes (Signed)
Summary: Refill/gh  Phone Note Refill Request Message from:  Pharmacy on October 27, 2008 11:02 AM  Refills Requested: Medication #1:  CATAPRES 0.1 MG TABS Take 1 tablet by mouth  twice a day   Last Refilled: 09/24/2008  Medication #2:  METFORMIN HCL 500 MG TABS Take 1 tablet by mouth twice a day   Last Refilled: 09/21/2008  Method Requested: Electronic Initial call taken by: Angelina Ok RN,  October 27, 2008 11:02 AM  Follow-up for Phone Call        Refill approved-nurse to complete Follow-up by: Clydie Braun MD,  October 28, 2008 3:05 PM      Prescriptions: METFORMIN HCL 500 MG TABS (METFORMIN HCL) Take 1 tablet by mouth twice a day  #62 x 5   Entered and Authorized by:   Clydie Braun MD   Signed by:   Clydie Braun MD on 10/28/2008   Method used:   Telephoned to ...       [facility] [address] (retail)             [place], [place]         Ph: [phone]       Fax:    RxID:   [id number] CATAPRES 0.1 MG TABS (CLONIDINE HCL) Take 1 tablet by mouth  twice a day  #64 x 5   Entered and Authorized by:   Clydie Braun MD   Signed by:   Clydie Braun MD on 10/28/2008   Method used:   Telephoned to ...       [facility] [address] (retail)             [place], [place]         Ph: [phone]       Fax:    RxID:   [id number]

## 2010-12-02 NOTE — Progress Notes (Signed)
Summary: refill/ hla  Phone Note Refill Request Message from:  Fax from Pharmacy on September 04, 2007 11:50 AM  Refills Requested: Medication #1:  CATAPRES 0.1 MG TABS Take 1 tablet by mouth three times a day  Medication #2:  METFORMIN HCL 500 MG TABS Take 1 tablet by mouth twice a day Initial call taken by: Marin Roberts RN,  September 04, 2007 11:50 AM  Follow-up for Phone Call        Refill approved-nurse to complete Follow-up by: Ulyess Mort MD,  September 04, 2007 11:51 AM      Prescriptions: METFORMIN HCL 500 MG TABS (METFORMIN HCL) Take 1 tablet by mouth twice a day  #62 x 1   Entered and Authorized by:   Ulyess Mort MD   Signed by:   Ulyess Mort MD on 09/04/2007   Method used:   Electronically sent to ...       [address]       [address]       [place], [place]  [address]       Ph: [phone]       Fax: [phone]   RxID:   [id number] CATAPRES 0.1 MG TABS (CLONIDINE HCL) Take 1 tablet by mouth three times a day  #90 x 1   Entered and Authorized by:   Ulyess Mort MD   Signed by:   Ulyess Mort MD on 09/04/2007   Method used:   Electronically sent to ...       [address]*       [address]       [place], [place]  [address]       Ph: [phone]       Fax: [phone]   RxID:   [id number]

## 2010-12-02 NOTE — Progress Notes (Signed)
Summary: refill/gg    att  Phone Note Refill Request  on January 02, 2008 2:29 PM  Refills Requested: Medication #1:  HYDROCHLOROTHIAZIDE 25 MG TABS Take 1 tablet by mouth once a day   Last Refilled: 12/05/2007  Medication #2:  ATENOLOL 100 MG TABS Take 1 tablet by mouth once a day   Last Refilled: 11/29/2007  Medication #3:  METFORMIN HCL 500 MG TABS Take 1 tablet by mouth twice a day   Last Refilled: 10/07/2007  Method Requested: electronic Initial call taken by: Merrie Roof RN,  January 02, 2008 2:29 PM  Follow-up for Phone Call        Refill approved-nurse to complete Follow-up by: Ulyess Mort MD,  January 02, 2008 2:41 PM      Prescriptions: METFORMIN HCL 500 MG TABS (METFORMIN HCL) Take 1 tablet by mouth twice a day  #62 x 3   Entered and Authorized by:   Ulyess Mort MD   Signed by:   Ulyess Mort MD on 01/02/2008   Method used:   Electronically sent to ...       [address]       [address]       [place], [place]  [address]       Ph: [phone]       Fax: [phone]   RxID:   [id number] HYDROCHLOROTHIAZIDE 25 MG TABS (HYDROCHLOROTHIAZIDE) Take 1 tablet by mouth once a day  #30 x 3   Entered and Authorized by:   Ulyess Mort MD   Signed by:   Ulyess Mort MD on 01/02/2008   Method used:   Electronically sent to ...       [address]*       [address]       [place], [place]  [address]       Ph: [phone]       Fax: [phone]   RxID:   [id number] ATENOLOL 100 MG TABS (ATENOLOL) Take 1 tablet by mouth once a day  #30 x 3   Entered and Authorized by:   Ulyess Mort MD   Signed by:   Ulyess Mort MD on 01/02/2008   Method used:   Electronically sent to ...       [address]*       [address]       [place], [place]  [address]       Ph: [phone]       Fax: [phone]   RxID:   [id number]

## 2010-12-02 NOTE — Assessment & Plan Note (Signed)
Summary: CHECKUP/ SB.   Vital Signs:  Patient Profile:   65 Years Old Female Height:     62.5 inches (158.75 cm) Weight:      251.1 pounds BMI:     45.36 Temp:     97.8 degrees F oral Pulse rate:   60 / minute BP sitting:   192 / 112  (right arm)  Vitals Entered By: Filomena Jungling NT II (May 21, 2008 2:26 PM)             Is Patient Diabetic? Yes  Nutritional Status BMI of 25 - 29 = overweight CBG Result 74  Have you ever been in a relationship where you felt threatened, hurt or afraid?No   Does patient need assistance? 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 2D echo in 3/07 showed Hypokinesis of apical aspect of inferior septum and mid apical inferior wall. AOCD - Ferritin 47, Iron 101, %saturation 31  Here today for a check up, with an alarming BP! Initially 190, on recheck is 160.  No headache, chest pain, palpatations, or vision changes.  She reports that she went to a family reunion this weekend and consumed a lot of salty food and alcohol.  She insists that she is taking her medications as prescribed.        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 2D echo in 3/07 showed Hypokinesis of apical aspect of inferior septum and mid apical inferior wall.    AOCD - Ferritin 47, Iron 101, %saturation 31    Risk Factors:  Tobacco use:  current    Cigarettes:  Yes -- Occ pack(s) per day Alcohol use:  yes    Type:  beer/wine    Drinks per day:  <1 Exercise:  yes   Review of Systems  General      Complains of fatigue.      Denies chills, fever, loss of appetite, and sweats.  CV      Denies chest pain or discomfort and swelling  of feet.  Resp      Complains of cough and wheezing.      Denies shortness of breath and sputum productive.  GI      Denies constipation and diarrhea.   Physical Exam  General:     alert and overweight-appearing.   Eyes:     pupils equal, pupils round, and pupils reactive to light.   Mouth:     pharynx pink and moist.   Neck:     no masses.   Lungs:     normal respiratory effort and normal breath sounds.   Heart:     normal rate, regular rhythm, and no murmur.   Abdomen:     soft, non-tender, and normal bowel sounds.   Msk:     R ankle is swollen and warm from a sprain.  L knee is TTP, no crepitice, normal ROM. Pulses:     2+ Extremities:     no edema Neurologic:     alert & oriented X3, cranial nerves II-XII intact, and strength normal  in all extremities.   Psych:     Oriented X3, memory intact for recent and remote, and normally interactive.      Impression & Recommendations:  Problem # 1:  Hx of ESSENTIAL HYPERTENSION (ICD-401.9) BP very high (first check 190 second check 160) She insists that this is due to the salty food she ate at a family reunion 3 days ago.  She feels she is retaining fluid, but there is no evidence of this on exam.  She has had a work up for secondary HTN by Dr. Beverely Pace including renal doppler which was normal.  She has a hx of his renin aldo ratio of 50 last year. The renin was low but the aldo was normal.  She has never been hypokalemic in clinic.  I do not think she has hyperaldosteronism.  I think most likely she has a transient elevation due to high salt intake and she probably misses doses especially of the clonidine.  After discussion with Dr. Aundria Rud have decided to dc atenolol and start norvasc for better bp control and to decrease clonidine to twice a day for better compliance.  Will check BMET and UA today and schedule appt in one month.   The following medications were removed from the medication list:    Atenolol 100 Mg Tabs (Atenolol)  .Marland Kitchen... Take 1 tablet by mouth once a day  Her updated medication list for this problem includes:    Catapres 0.1 Mg Tabs (Clonidine hcl) .Marland Kitchen... Take 1 tablet by mouth  twice a day    Hydrochlorothiazide 25 Mg Tabs (Hydrochlorothiazide) .Marland Kitchen... Take 1 tablet by mouth once a day    Lisinopril 40 Mg Tabs (Lisinopril) .Marland Kitchen... Take 1 tablet by mouth once a day    Norvasc 10 Mg Tabs (Amlodipine besylate) .Marland Kitchen... Take once a day for blood pressure  BP today: 192/112 Prior BP: 122/89 (07/09/2007)  Prior 10 Yr Risk Heart Disease: 20 % (02/12/2007)  Labs Reviewed: Creat: 0.85 (07/09/2007) Chol: 211 (12/27/2006)   HDL: 104 (12/27/2006)   LDL: 62 (12/27/2006)   TG: 226 (12/27/2006)  Orders: T-Basic Metabolic Panel [id number]) T-Urinalysis Dipstick only ([id number])   Problem # 2:  SCREENING FOR MALIGNANT NEOPLASM, COLON (ICD-V76.51) She does not want a colonoscopy so will provide stool cards.  Orders: Hemoccult Cards (Take Home) (Hemoccult Cards)   Problem # 3:  DM (ICD-250.00) A1C is 7.2.  good control on just metformin.  no changes.   Her updated medication list for this problem includes:    Metformin Hcl 500 Mg Tabs (Metformin hcl) .Marland Kitchen... Take 1 tablet by mouth twice a day    Aspir-low 81 Mg Tbec (Aspirin) .Marland Kitchen... Take 1 tablet by mouth once a day    Lisinopril 40 Mg Tabs (Lisinopril) .Marland Kitchen... Take 1 tablet by mouth once a day  Orders: T- Capillary Blood Glucose (08657) T-Hgb A1C (in-house) (84696EX)  Labs Reviewed: HgBA1c: 7.2 (05/21/2008)   Creat: 0.85 (07/09/2007)   Microalbumin: 1.13 (09/27/2006)   Problem # 4:  SCREENING MAMMOGRAM NEC (ICD-V76.12) will need to schedule another mamogram this september. Future Orders: Mammogram (Screening) (Mammo) ... 07/24/2008   Complete Medication List: 1)  Catapres 0.1 Mg Tabs (Clonidine hcl) .... Take 1 tablet by mouth  twice a day 2)  Hydrochlorothiazide 25 Mg Tabs (Hydrochlorothiazide) .... Take 1 tablet by mouth once a day 3)  Metformin Hcl  500 Mg Tabs (Metformin hcl) .... Take 1 tablet by mouth twice a day 4)  Aspir-low 81 Mg Tbec (Aspirin) .... Take  1 tablet by mouth once a day 5)  Lisinopril 40 Mg Tabs (Lisinopril) .... Take 1 tablet by mouth once a day 6)  Norvasc 10 Mg Tabs (Amlodipine besylate) .... Take once a day for blood pressure   Patient Instructions: 1)  Please schedule a follow-up appointment in 1 month. 2)  You have a new prescription for norvasc for high blood pressure. 3)  You should stop taking atenolol. 4)  You should take Clonidine twice a day. 5)  You should continue to avoid salty food.   Prescriptions: NORVASC 10 MG  TABS (AMLODIPINE BESYLATE) Take once a day for blood pressure  #32 x 3   Entered and Authorized by:   Elby Showers MD   Signed by:   Elby Showers MD on 05/21/2008   Method used:   Electronically sent to ...       [address]*       [address]       [place], [place]  [address]       Ph: [phone]       Fax: [phone]   RxID:   [id number]  ]  Last LDL:                                                 62 (12/27/2006 9:28:00 PM)        Diabetic Foot Exam Foot Inspection Is there a history of a foot ulcer?              No Is there a foot ulcer now?              No Can the patient see the bottom of their feet?          Yes Are the shoes appropriate in style and fit?          No Is there swelling or an abnormal foot shape?          No Are the toenails long?                No Are the toenails thick?                No Are the toenails ingrown?              No Is there heavy callous build-up?              No Is there a claw toe deformity?                          No Is there elevated skin temperature?            No Is there limited ankle dorsiflexion?            No Is there foot or ankle muscle weakness?            No Do you have pain in calf while walking?           No         10-g (5.07) Semmes-Weinstein Monofilament Test Performed by: Filomena Jungling  NT II          Right Foot          Left Foot Site 1         normal  normal Site 2         normal         normal Site 3         normal         normal Site 4         normal         normal Site 5         normal         normal Site 6         normal         normal Site 7         normal         normal Site 8         normal         normal Site 9         normal         normal    Laboratory Results   Urine Tests  Date/Time Received: May 21, 2008 3:51 PM Date/Time Reported: Alric Quan  May 21, 2008 3:51 PM  Routine Urinalysis   Color: yellow Appearance: Clear Glucose: negative   (Normal Range: Negative) Bilirubin: negative   (Normal Range: Negative) Ketone: negative   (Normal Range: Negative) Spec. Gravity: >=1.030   (Normal Range: 1.003-1.035) Blood: negative   (Normal Range: Negative) pH: 6.0   (Normal Range: 5.0-8.0) Protein: negative   (Normal Range: Negative) Urobilinogen: 0.2   (Normal Range: 0-1) Nitrite: negative   (Normal Range: Negative) Leukocyte Esterace: trace   (Normal Range: Negative)     Blood Tests   Date/Time Received: May 21, 2008 2:37 PM Date/Time Reported: Alric Quan  May 21, 2008 2:37 PM  HGBA1C: 7.2%   (Normal Range: Non-Diabetic - 3-6%   Control Diabetic - 6-8%) CBG Random:: 74mg /dL

## 2010-12-02 NOTE — Consult Note (Signed)
[facility]: [facility]   Imported By: Florinda Marker 03/05/2009 15:08:37  _____________________________________________________________________  External Attachment:    Type:   Image     Comment:   External Document  Appended Document: Earley Brooke    Clinical Lists Changes  Observations: Added new observation of [id number]: 02/2010 (03/10/2009 9:10)       Procedures Next Due Date:    Diabetic Eye Exam: 02/2010   Procedures Next Due Date:    Diabetic Eye Exam: 02/2010

## 2010-12-02 NOTE — Progress Notes (Signed)
Summary: refill/nls  Phone Note Refill Request  on June 06, 2007 11:17 AM  Refills Requested: Medication #1:  LISINOPRIL 40 MG TABS Take 1 tablet by mouth once a day  Medication #2:  METFORMIN HCL 500 MG TABS Take 1 tablet by mouth twice a day please clarify dosage refill is for Lisinopril 20mg  last refill on 02/08/07 for # 30, also metformin 500mg  once daily  Initial call taken by: Concepcion Elk,  June 06, 2007 11:17 AM  Follow-up for Phone Call        Patient should be on Metformin 500mg  two times a day and Lisinopril 40mg  daily.  Please schedule appt for labs and medication clarification and teaching.  Follow-up by: Manning Charity MD,  June 06, 2007 11:22 AM  Additional Follow-up for Phone Call Additional follow up Details #1::       Additional Follow-up by: Concepcion Elk,  June 06, 2007 12:05 PM      Prescriptions: LISINOPRIL 40 MG TABS (LISINOPRIL) Take 1 tablet by mouth once a day  #30 x 0   Entered and Authorized by:   Manning Charity MD   Signed by:   Manning Charity MD on 06/06/2007   Method used:   Electronically sent to ...       [facility] [address]       [address]       [place], [place]  [address]       Ph:        Fax:    RxID:   [id number] METFORMIN HCL 500 MG TABS (METFORMIN HCL) Take 1 tablet by mouth twice a day  #62 x 0   Entered and Authorized by:   Manning Charity MD   Signed by:   Manning Charity MD on 06/06/2007   Method used:   Electronically sent to ...       [facility] [address]*       [address]       [place], [place]  [address]       Ph:        Fax:    RxID:   [id number]

## 2010-12-02 NOTE — Assessment & Plan Note (Signed)
Summary: FU/EST/VS   Vital Signs:  Patient profile:   65 year old female Height:      62 inches (157.48 cm) Weight:      249.3 pounds (113.32 kg) BMI:     45.76 O2 Sat:      96 % Temp:     99.3 degrees F Pulse rate:   86 / minute BP sitting:   149 / 109  (right arm)  Vitals Entered By: Dorie Rank RN (January 12, 2009 1:51 PM) Is Patient Diabetic? Yes  Pain Assessment Patient in pain? no      Nutritional Status BMI of > 30 = obese CBG Result 152  Have you ever been in a relationship where you felt threatened, hurt or afraid?No   Does patient need assistance? Functional Status Self care Ambulation Normal Comments c/o bronchitis and asthma chronically - seen in ED approx 1 week ago and got breathing treatment  O2 sat done on room air   Primary Care Provider:  Elby Showers MD   History of Present Illness: This is a 65 year old woman with past medical history of HTN, DM, NICM, AOCD. She is here today for regular DM check up and to dicuss her chronic breathing trouble/cough.    She has had a cough productive of thick white sputum, which has been going on for 2 years on and off.  Started in July 08. She coughs a lot at night.  The change in the weather bothers her.  her head feels stuffed up alot. She was hospitalized in January for bronchitis and she does not feel any better now than she did then.  She is not taking any medications for her breathing trouble or cough.  She recently went to the ED and was given tussionex which she has already finished... didn't seem to work, and an inhaler which opens her up but only for a little while, she recently took some benadryl which made her wheesing go away.  She has had no chest pain, fevers or lower extremity swelling.     Preventive Screening-Counseling & Management     Smoking Status: quit     Year Quit: 2010, approx Jan     Pack years: 50  Current Medications (verified): 1)  Catapres 0.1 Mg Tabs (Clonidine Hcl) ....  Take 1 Tablet By Mouth  Twice A Day 2)  Hydrochlorothiazide 25 Mg Tabs (Hydrochlorothiazide) .... Take 1 Tablet By Mouth Once A Day 3)  Metformin Hcl 500 Mg Tabs (Metformin Hcl) .... Take 1 Tablet By Mouth Twice A Day 4)  Aspir-Low 81 Mg Tbec (Aspirin) .... Take 1 Tablet By Mouth Once A Day 5)  Lisinopril 40 Mg Tabs (Lisinopril) .... Take 1 Tablet By Mouth Once A Day 6)  Norvasc 10 Mg  Tabs (Amlodipine Besylate) .... Take Once A Day For Blood Pressure 7)  Pravastatin Sodium 20 Mg  Tabs (Pravastatin Sodium) .... One By Mouth At Bedtime  Allergies: No Known Drug Allergies  Past History:  Past Surgical History:    ORIF right ankle    surgical repair left wrist    Caesarean section x 2    laser surgery right eye 2010  Social History:    Smoking Status:  quit  Review of Systems       per hpi.  other systems reveiwed and negative.  Physical Exam  General:  alert and overweight-appearing.   Nose:  no external deformity and nasal discharge, mucosal pallor.   Mouth:  pharynx  pink and moist and poor dentition.   Lungs:  normal respiratory effort and normal breath sounds.   Heart:  no murmur, no JVD, and tachycardia.   Pulses:  +1 Extremities:  no edema Neurologic:  alert & oriented X3, cranial nerves II-XII intact, and strength normal in all extremities.    Diabetes Management Exam:    Foot Exam (with socks and/or shoes not present):       Sensory-Pinprick/Light touch:          Left medial foot (L-4): normal          Left dorsal foot (L-5): normal          Left lateral foot (S-1): normal          Right medial foot (L-4): normal          Right dorsal foot (L-5): normal          Right lateral foot (S-1): normal       Sensory-Monofilament:          Left foot: normal          Right foot: normal       Inspection:          Left foot: normal          Right foot: normal       Nails:          Left foot: normal          Right foot: normal    Eye Exam:       Eye Exam not due    Impression & Recommendations:  Problem # 1:  HYPERLIPIDEMIA (ICD-272.4) She was started on pravastatin one month ago.  Will check CMET and lipids today.  Her updated medication list for this problem includes:    Pravastatin Sodium 20 Mg Tabs (Pravastatin sodium) ..... One by mouth at bedtime  Orders: T-Comprehensive Metabolic Panel [id number]) T-Lipid Profile ([id number])  Problem # 2:  DM (ICD-250.00) A1C up to 8.4. Will increase metformin and add glypizide and make apt with Jamison Neighbor.   The following medications were removed from the medication list:    Lisinopril 40 Mg Tabs (Lisinopril) .Marland Kitchen... Take 1 tablet by mouth once a day Her updated medication list for this problem includes:    Glipizide-metformin Hcl 5-500 Mg Tabs (Glipizide-metformin hcl) .Marland Kitchen... Take 2 tablets twice a day.    Aspir-low 81 Mg Tbec (Aspirin) .Marland Kitchen... Take 1 tablet by mouth once a day  Orders: T- Capillary Blood Glucose (29562) T-Hgb A1C (in-house) (13086VH) Diabetic Clinic Referral (Diabetic)  Labs Reviewed: Creat: 0.60 (11/20/2008)    HgBA1c: 6.6 (10/13/2008)  7.2 (05/21/2008)  Problem # 3:  UNSPECIFIED TACHYCARDIA (ICD-785.0) On exam she sounded very tachy.  EKG shows NSR in 80's.  Orders: 12 Lead EKG (12 Lead EKG)  Problem # 4:  ACUTE BRONCHITIS (ICD-466.0) She descibes a cough for almost to years that will not go away.  CXR from ED last week is stable from previous with no acute process. She was stared on lisinopril at about the same time as the cough started. Will try to dc lisinopril for one month and see if this resolves. If not better at that time than restart ACE and try claritin for one month (story is also suggestive of allergies and exam supports).  If this does not work would try to increase GERD coverage.  If cough resolves without ACE than would try an ARB, as she is a diabetic.  Problem # 5:  Hx of ESSENTIAL HYPERTENSION (ICD-401.9) BP elevated today.  She reports that she is taking  her medications as prescribed.  We are holding lisinopril this month.  She will probably med titration  to control her pressures.  We also discussed weight loss.  She has gained 50 lbs over the past 3 years and knows she needs to lose it.  She is thinking about walking... I encouraged her to do so.  The following medications were removed from the medication list:    Lisinopril 40 Mg Tabs (Lisinopril) .Marland Kitchen... Take 1 tablet by mouth once a day Her updated medication list for this problem includes:    Catapres 0.1 Mg Tabs (Clonidine hcl) .Marland Kitchen... Take 1 tablet by mouth  twice a day    Hydrochlorothiazide 25 Mg Tabs (Hydrochlorothiazide) .Marland Kitchen... Take 1 tablet by mouth once a day    Norvasc 10 Mg Tabs (Amlodipine besylate) .Marland Kitchen... Take once a day for blood pressure  BP today: 149/109 Prior BP: 133/96 (11/20/2008)  Prior 10 Yr Risk Heart Disease: 20 % (02/12/2007)  Labs Reviewed: Creat: 0.60 (11/20/2008) Chol: 211 (12/27/2006)   HDL: 104 (12/27/2006)   LDL: 103  --  11/02/2008 (11/11/2008)   TG: 226 (12/27/2006)  Problem # 6:  Hx of MORBID OBESITY (ICD-278.01) She has gained 50 lbs over the past 3 years and knows she needs to lose it.  She is thinking about walking... I encouraged her to do so.  Complete Medication List: 1)  Catapres 0.1 Mg Tabs (Clonidine hcl) .... Take 1 tablet by mouth  twice a day 2)  Hydrochlorothiazide 25 Mg Tabs (Hydrochlorothiazide) .... Take 1 tablet by mouth once a day 3)  Glipizide-metformin Hcl 5-500 Mg Tabs (Glipizide-metformin hcl) .... Take 2 tablets twice a day. 4)  Aspir-low 81 Mg Tbec (Aspirin) .... Take 1 tablet by mouth once a day 5)  Norvasc 10 Mg Tabs (Amlodipine besylate) .... Take once a day for blood pressure 6)  Pravastatin Sodium 20 Mg Tabs (Pravastatin sodium) .... One by mouth at bedtime  Patient Instructions: 1)  Stop taking lisinopril. 2)  Please schedule a follow-up appointment in 1 month. 3)  You have a new prescription for metformin and glipzide. 4)   You will meet with Jamison Neighbor to discuss diabetes. 5)  You had labs done today, we will call you if there is anything abnormal. Prescriptions: GLIPIZIDE-METFORMIN HCL 5-500 MG TABS (GLIPIZIDE-METFORMIN HCL) Take 2 tablets twice a day.  #120 x 6   Entered and Authorized by:   Elby Showers MD   Signed by:   Elby Showers MD on 01/13/2009   Method used:   Electronically to        [facility]  [address] [phone]* (retail)       [address]       [place], [place]  [address]       Ph: [phone] or [phone]       Fax: [phone]   RxID:   [id number]    Last LDL:                                                 103  --  11/02/2008 (11/11/2008 1:47:50 PM)        Diabetic Foot Exam Foot Inspection Is there a history of a foot ulcer?  No Is there a foot ulcer now?              No Can the patient see the bottom of their feet?          Yes Are the shoes appropriate in style and fit?          Yes Is there swelling or an abnormal foot shape?          No Are the toenails long?                No Are the toenails thick?                No Are the toenails ingrown?              No Is there heavy callous build-up?              Yes Is there pain in the calf muscle (Intermittent claudication) when walking?    NoIs there a claw toe deformity?              No Is there elevated skin temperature?            No Is there limited ankle dorsiflexion?            No Is there foot or ankle muscle weakness?            No  Comments: dry hardened skin on heels but still able to feel filament   10-g (5.07) Semmes-Weinstein Monofilament Test Performed by: Dorie Rank RN          Right Foot          Left Foot Visual Inspection     normal           normal Site 1         normal         normal Site 2         normal         normal Site 3         normal         normal Site 4         normal         normal Site 5         normal         normal Site 6          normal         normal Site 9         normal         normal  Impression      normal         normal  Legend:  Site 1 = Plantar aspect of first toe (center of pad) Site 2 = Plantar aspect of third toe (center of pad) Site 3 = Plantar aspect of fifth toe (center of pad) Site 4 = Plantar aspect of first metatarsal head Site 5 = Plantar aspect of third metatarsal head Site 6 = Plantar aspect of fifth metatarsal head Site 7 = Plantar aspect of medial midfoot Site 8 = Plantar aspect of lateral midfoot Site 9 = Plantar aspect of heel Site 10 = dorsal aspect of foot between the base of the first and second toes   Result is Abnormal if patient was unable to perceive the monofilament at site indicated.   Laboratory Results   Blood Tests   Date/Time Received: January 12, 2009 2:16 PM. Date/Time Reported:  Tracey Fulcher  January 12, 2009 2:16 PM  HGBA1C: 8.2%   (Normal Range: Non-Diabetic - 3-6%   Control Diabetic - 6-8%) CBG Random:: 152mg /dL

## 2010-12-02 NOTE — Assessment & Plan Note (Signed)
Summary: est-ck/fu/meds/cfb   Vital Signs:  Patient Profile:   65 Years Old Female Height:     62.5 inches (158.75 cm) Weight:      250.0 pounds (113.64 kg) BMI:     45.16 O2 Sat:      100 % O2 treatment:    Room Air Temp:     98.5 degrees F (36.94 degrees C) oral Pulse rate:   74 / minute BP sitting:   146 / 88  (right arm)  Pt. in pain?   no  Vitals Entered By: Youlanda Roys RN (October 13, 2008 10:37 AM)              Is Patient Diabetic? Yes Did you bring your meter with you today? No Nutritional Status BMI of > 30 = obese Nutritional Status Detail appetite ok CBG Result 141  Have you ever been in a relationship where you felt threatened, hurt or afraid?denies   Does patient need assistance? Functional Status Self care Ambulation Normal     PCP:  [place]  Chief Complaint:  Right ankle swollen since 10/12/08 and went to ER last month - non productive cough..  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  This ankle was broken and surgicaly corrected 17 years ago, and how flares up with too much activity.  Se has been doing a lot of christmas shopping and now her ankle is sore and swollen.    Prior Medications Reviewed Using: Patient Recall  Updated Prior Medication List: CATAPRES 0.1 MG TABS (CLONIDINE HCL) Take 1 tablet by mouth  twice a day HYDROCHLOROTHIAZIDE 25 MG TABS (HYDROCHLOROTHIAZIDE) Take 1 tablet by mouth once a day METFORMIN HCL 500 MG TABS (METFORMIN HCL) Take 1 tablet by mouth twice a day ASPIR-LOW 81 MG TBEC (ASPIRIN) Take 1 tablet by mouth once a day LISINOPRIL 40 MG TABS (LISINOPRIL) Take 1 tablet by mouth once a day NORVASC 10 MG  TABS (AMLODIPINE BESYLATE) Take once a day for blood pressure  Current Allergies: No known allergies     Risk Factors:  Tobacco use:  current    Cigarettes:  Yes  -- Occ pack(s) per day Alcohol use:  yes    Type:  beer/wine    Drinks per day:  <1 Exercise:  yes   Review of Systems       per hpi   Physical Exam  General:     alert and overweight-appearing.   Head:     normocephalic and atraumatic.   Eyes:     vision grossly intact, pupils equal, pupils round, and pupils reactive to light.   Mouth:     pharynx pink and moist and poor dentition.   Lungs:     normal respiratory effort and normal breath sounds.   Heart:     normal rate, regular rhythm, and no murmur.   Abdomen:     soft and normal bowel sounds.   Msk:     right ankle is slighly swollen, no warmth, no erythema, sore to passive motion but not extremly so, no instability in joint, no crepitus, ttp mostly over her scar on medial ankle. Pulses:     +1 Extremities:     no edema Neurologic:     alert & oriented X3, cranial nerves II-XII intact, and strength normal in all  extremities.   Skin:     turgor normal and no rashes.   Psych:     Oriented X3, memory intact for recent and remote, and normally interactive.      Impression & Recommendations:  Problem # 1:  DM (ICD-250.00) A1C is 6.6.  Good control, no changes needed.  Recheck in 3 mo.  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  Her updated medication list for this problem includes:    Metformin Hcl 500 Mg Tabs (Metformin hcl) .Marland Kitchen... Take 1 tablet by mouth twice a day    Aspir-low 81 Mg Tbec (Aspirin) .Marland Kitchen... Take 1 tablet by mouth once a day    Lisinopril 40 Mg Tabs (Lisinopril) .Marland Kitchen... Take 1 tablet by mouth once a day  Orders: T- Capillary Blood Glucose (16109) T-Hgb A1C (in-house) ([id number]) Ophthalmology Referral (Ophthalmology)  Labs Reviewed: HgBA1c: 6.6 (10/13/2008)   Creat: 0.56 (05/21/2008)   Microalbumin: 1.13 (09/27/2006)   Problem # 2:  ANKLE PAIN, RIGHT (ICD-719.47) Given history of remote trauma to this ankle I am fairly sure that she now has some arthritis, which flares  with slight irratation. Will rec ibuprofin 600mg  three times a day.  Problem # 3:  Hx of MORBID OBESITY (ICD-278.01) She weighs 250lbs and she knows this is too much.  She likes walking but is scared to go out in her neighborhood.  WE discussed mall walking, or [employer]  We discussed food choice and I feel she has little insight into dietary change.  Will sign her up for nutrition class in Jan.  Set goal of losing 15 lbs in the next 3-6 months.  Problem # 4:  Preventive Health Care (ICD-V70.0) Had mammogram in 2008 (new recs every 2 yrs) Can not remember last pap... declined one today.  Told her she will get it in 3 months when she comes back. Never had a colonoscopy, can not do stool cards (?), she will be 65 in one month.  Will need to set up colonoscopy at next appt.   Problem # 5:  Hx of ESSENTIAL HYPERTENSION (ICD-401.9) BP much improved today.  She has not taken her clonidine this am, and she is in pain, so may be better controled than it looks.  Recheck at next visit.  Her updated medication list for this problem includes:    Catapres 0.1 Mg Tabs (Clonidine hcl) .Marland Kitchen... Take 1 tablet by mouth  twice a day    Hydrochlorothiazide 25 Mg Tabs (Hydrochlorothiazide) .Marland Kitchen... Take 1 tablet by mouth once a day    Lisinopril 40 Mg Tabs (Lisinopril) .Marland Kitchen... Take 1 tablet by mouth once a day    Norvasc 10 Mg Tabs (Amlodipine besylate) .Marland Kitchen... Take once a day for blood pressure  BP today: 146/88 Prior BP: 192/112 (05/21/2008)  Prior 10 Yr Risk Heart Disease: 20 % (02/12/2007)  Labs Reviewed: Creat: 0.56 (05/21/2008) Chol: 211 (12/27/2006)   HDL: 104 (12/27/2006)   LDL: 62 (12/27/2006)   TG: 226 (12/27/2006)   Complete Medication List: 1)  Catapres 0.1 Mg Tabs (Clonidine hcl) .... Take 1 tablet by mouth  twice a day 2)  Hydrochlorothiazide 25 Mg Tabs (Hydrochlorothiazide) .... Take 1 tablet by mouth once a day 3)  Metformin Hcl 500 Mg Tabs (Metformin hcl) .... Take 1 tablet by mouth twice a  day 4)  Aspir-low 81 Mg Tbec (Aspirin) .... Take 1 tablet by mouth once a day 5)  Lisinopril 40 Mg Tabs (Lisinopril) .... Take 1 tablet  by mouth once a day 6)  Norvasc 10 Mg Tabs (Amlodipine besylate) .... Take once a day for blood pressure 7)  Ibuprofen 600 Mg Tabs (Ibuprofen) .... Take one tablet three times a day for ankle pain for one week.   Patient Instructions: 1)  Please schedule a follow-up appointment in 3 months. 2)  Please sign up for January nutrition class. 3)  You should do some physical activity for 30 minutes each day. 4)  You should take 600mg  of ibuprofin three times a day for your ankle.  You should continue to ice and elevated it when it hurts.    Prescriptions: IBUPROFEN 600 MG TABS (IBUPROFEN) Take one tablet three times a day for ankle pain for one week.  #21 x 0   Entered and Authorized by:   Elby Showers MD   Signed by:   Elby Showers MD on 10/13/2008   Method used:   Print then Give to Patient   RxID:   [id number]  ]  Vital Signs:  Patient Profile:   65 Years Old Female Height:     62.5 inches (158.75 cm) Weight:      250.0 pounds (113.64 kg) BMI:     45.16 O2 Sat:      100 % Temp:     98.5 degrees F (36.94 degrees C) oral Pulse rate:   74 / minute BP sitting:   146 / 88             CBG Result 141     Laboratory Results   Blood Tests   Date/Time Received: October 13, 2008 10:48 AM. Date/Time Reported: Alric Quan  October 13, 2008 10:48 AM  HGBA1C: 6.6%   (Normal Range: Non-Diabetic - 3-6%   Control Diabetic - 6-8%) CBG Random:: 141mg /dL

## 2010-12-02 NOTE — Miscellaneous (Signed)
Assessment & Plan:  Status of Existing Problems: 1)  Assessed Acute Bronchitis As Comment Only - Olene Craven MD  2)  Assessed Sinus Tachycardia As Comment Only - Olene Craven MD  3)  Assessed Hypokalemia As Comment Only - Olene Craven MD  4)  Assessed Abuse, Alcohol, Unspecified As Comment Only - Olene Craven MD  5)  Assessed Dm As Comment Only - Olene Craven MD   Medical Problems Added: 1)  Dx of Hypokalemia  (ICD-276.8) 2)  Dx of Sinus Tachycardia  (ICD-427.89) 3)  Dx of Acute Bronchitis  (ICD-466.0)  Updated Medical Problems: 1)  Dx of Hypokalemia  (ICD-276.8) 2)  Dx of Sinus Tachycardia  (ICD-427.89) 3)  Dx of Acute Bronchitis  (ICD-466.0) 4)  Dx of Ankle Pain, Right  (ICD-719.47) 5)  Dx of Abuse, Alcohol, 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 Take 1 tablet by mouth  twice a day 2)  Hydrochlorothiazide 25 Mg Tabs (Hydrochlorothiazide) .... Take 1 tablet by mouth once a day 3)  Metformin Hcl 500 Mg Tabs (Metformin hcl) .... Take 1 tablet by mouth twice a day 4)  Aspir-low 81 Mg Tbec (Aspirin) .... Take 1 tablet by mouth once a day 5)  Lisinopril 40 Mg Tabs (Lisinopril) .... Take 1 tablet by mouth once a day 6)  Norvasc 10 Mg Tabs (Amlodipine besylate) .... Take once a day for blood pressure 7)  Ibuprofen 600 Mg Tabs (Ibuprofen) .... Take one tablet three times a day for ankle pain for one week.  Follow up instructions: Pt admitted to  tele bed, inpt status. Attending: Dr. Lowella Bandy    - R1: Dr. Loel Dubonnet [phone]    - R2: Dr. Burt Ek [phone] Clinical Lists Changes  Problems: Added new problem of ACUTE BRONCHITIS (ICD-466.0) Added new problem of SINUS TACHYCARDIA (ICD-427.89) Added new problem of HYPOKALEMIA (ICD-276.8) Assessed ACUTE BRONCHITIS as comment only - +/- asthma. No PNA on cxr. PE essentially r/o with normal Ddimers in a low prob pt.   - SoluMedrol 60 mg iv q 24h given ongoing wheezing, need for oxygen and probable asthma. - Xopenex (DAW) q 6h scheduled, q3h as needed. No Atrovent since no dx of COPD. - Azithromycin 500 x 1 then 250 once daily x 2 additional days for bronchitis coverage. - Daily peak flows. - Will need outpatient PFT's to determine whether she does have COPD/asthma. Assessed SINUS TACHYCARDIA as comment only - 2/2 cough and albuterol.   - Use Xopenex rather than albuterol.  - Continue telemetry. - Replete K to avoid arrhythmias. - Repeat ECG this AM.  Her updated medication list for this problem includes:    Aspir-low 81 Mg Tbec (Aspirin) .Marland Kitchen... Take 1 tablet by mouth once a day  Assessed HYPOKALEMIA as comment only - Pt's K was 2.8 on admission. She received 60 mEq by mouth and 10 mEq iv.  Most likely 2/2 albuterol.  - Replete by mouth and  anticipate need for further supplementation given continued use of albuterol. - Check Mg and replete as needed.  - Check ECG to monitor for signs of hypokalemia. - Check BMET now. Assessed ABUSE, ALCOHOL, UNSPECIFIED as comment only - Had 2 shots of rum and beer prior to presenting to hospital. Pt denies daily use of EtOh or hx of withdrawal.  - Nonetheless, will supplement folate and thiamine. - Keep possibility of withdrawal in mind if pt develops fevers or remains significantly tachycardic (in which case a benzo protocol would be appropriate). Assessed DM as comment only - Pt not insulin dependent. Doesn't check CBG's at home.  Last A1c was at goal less than a month ago.  - Continue metformin 500 two times a day. - Check CBGs three times a day AC and cover with a sensitive insulin sliding scale - anticipating hyperglycemia 2/2 steroid use.  Her updated medication list for this problem includes:    Metformin Hcl 500 Mg Tabs (Metformin hcl) .Marland Kitchen... Take 1 tablet by mouth twice a day    Aspir-low 81 Mg Tbec (Aspirin) .Marland Kitchen... Take 1 tablet by mouth once a day    Lisinopril 40 Mg Tabs (Lisinopril) .Marland Kitchen... Take 1 tablet by mouth once a day  Labs Reviewed: HgBA1c: 6.6 (10/13/2008)   Creat: 0.56 (05/21/2008)   Microalbumin: 1.13 (09/27/2006)  Observations: Added new observation of INSTRUCTIONS: Pt admitted to tele bed, inpt status. Attending: Dr. Lowella Bandy    - R1: Dr. Loel Dubonnet [phone]    - R2: Dr. Burt Ek [phone] (11/02/2008 5:55) Added new observation of SKIN SQ INSP: White discoloration plaques on face. (11/02/2008 5:55) Added new observation of PSYCH COMM: Oriented X3, memory intact for recent and remote, normally interactive, good eye contact, not anxious appearing, and not depressed appearing.   (11/02/2008 5:55) Added new observation of NEURO EXAM: alert & oriented X3, cranial nerves II-XII intact, strength normal in all extremities, and sensation intact to light touch.   (11/02/2008 5:55) Added new observation of ABDOMEN EXAM: soft, non-tender, and normal bowel sounds.   (11/02/2008 5:55) Added new observation of HEART EXAM: Tachy but regular. Systolic murmur 3/6 heart best at LPSB. (11/02/2008 5:55) Added new observation of LUNG EXAM: Fair air mvt. Diffuse coarse crackles with end expiratory wheezing. Deep breathing causes cough.  (11/02/2008 5:55) Added new observation of NECK EXAM: supple, full ROM, and no masses.   (11/02/2008 5:55) Added new observation of ORAL EXAM: Poor dentition, MMM. OP clear. (11/02/2008 5:55) Added new observation of HD/FACE INSP: atraumatic.   (11/02/2008 5:55) Added new  observation of GEN APPEAR: alert, well-developed, well-nourished, and well-hydrated. Obese elderly woman with frequent coughing spells. (11/02/2008 5:55) Added new observation of BP DIASTOLIC: 74 mmHg (11/02/2008 6:57) Added new observation of BP SYSTOLIC: 128 mmHg (11/02/2008 5:55) Added new observation of PULSE RHYTHM: regular  (11/02/2008 5:55) Added new observation of PULSE RATE: 110 /min (11/02/2008 5:55) Added new observation of O2 TREATMENT: Oxygen  (11/02/2008 5:55) Added new observation of O2SAT(OXIM): 96 % (11/02/2008 5:55) Added new observation of RESP RATE: 24 /min (11/02/2008 5:55) Added new observation of TEMP SITE: oral  (11/02/2008 5:55) Added new observation of TEMPERATURE: 98.5 deg F (11/02/2008 5:55) Added new observation of ROS: CARDIAC: Denies fainting, swelling of feet  (11/02/2008 5:55) Added new observation of QIO:NGEXBMW: Denies chills, loss of appetite  (11/02/2008 5:55) Added new observation of SOCIAL HX: 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 Alcohol: Drinks 1/4 to 1/2 bottle of wine each day on each  weekend and has recently cut back. Tobacco: 1/2 pack a week. Drugs: THC occasionally.   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 2D echo in 3/07 showed Hypokinesis of apical aspect of inferior septum and mid apical inferior wall. AOCD - Ferritin 47, Iron 101, %saturation 31   (11/02/2008 5:55) Added new observation of HPI: Ms. Rowzee is a 65 y/o obese woman with NIDDM, HTN who presented to the [facility] with c/o cough, dyspnea and wheezing. For the past 2 weeks, she has been having sinus/nasal congestion with spasmodic cough. She has been using Tussionex and Nyquil at home w/o much relief of her cough. She denies any fevers or chills. Has been feeling tight in her chest since she presented at the ED. Pleuritic component to her pain.  No exertional chest pain. Explains that she was first told she might have asthma in 08/2008 when she was hospitalized in same context. Doesn't use inhalers at home. Smokes "socially" when she drinks EtOh - usually a few cigarettes. Never was a daily smoker in the past. However, most of her friends and family do smoke. Drank 2 shots of rum and "some" beer on evening prior to admission while she was visiting friends.  (11/02/2008 5:55) Added new observation of VISIT TYPE: Hospital admission  (11/02/2008 5:55) Added new observation of PRIMARY MD: Elby Showers MD  (11/02/2008 5:55)     Visit Type:  Hospital admission PCP:  Elby Showers MD   History of Present Illness: Ms. Marsteller is a 65 y/o obese woman with NIDDM, HTN who presented to the [facility] with c/o cough, dyspnea and wheezing. For the past 2 weeks, she has been having sinus/nasal congestion with spasmodic cough. She has been using Tussionex and Nyquil at home w/o much relief of her cough. She denies any fevers or chills. Has been feeling tight in her chest since she presented at the ED. Pleuritic component to her pain. No exertional chest pain. Explains that she was first told she might have asthma in 08/2008 when she was hospitalized in same context. Doesn't use inhalers at home. Smokes "socially" when she drinks EtOh - usually a few cigarettes. Never was a daily smoker in the past. However, most of her friends and family do smoke. Drank 2 shots of rum and "some" beer on evening prior to admission while she was visiting friends.    Physical Exam  General:     alert, well-developed, well-nourished, and well-hydrated. Obese elderly woman with frequent coughing spells. Head:     atraumatic.   Mouth:     Poor dentition, MMM. OP clear. Neck:     supple, full ROM, and no masses.   Lungs:     Fair air mvt. Diffuse coarse crackles with end expiratory wheezing. Deep breathing causes cough.  Heart:     Tachy but regular. Systolic murmur  3/6 heart best at LPSB. Abdomen:     soft, non-tender, and normal bowel sounds.   Neurologic:     alert & oriented X3, cranial nerves II-XII intact, strength normal in all extremities, and sensation intact to light touch.   Skin:     White discoloration plaques on face. Psych:     Oriented X3, memory intact for recent and remote, normally interactive, good eye contact, not anxious appearing, and not depressed appearing.      Patient Instructions: 1)  Pt admitted to tele bed, inpt status. 2)  Attending: Dr. Lowella Bandy 3)     - R1: Dr. Loel Dubonnet [phone] 4)     - R2: Dr. Burt Ek [phone]   Impression & Recommendations:  Problem # 1:  ACUTE BRONCHITIS (ICD-466.0) +/- asthma. No PNA on cxr. PE essentially r/o with normal Ddimers in a low prob pt.   - SoluMedrol 60 mg iv q 24h given ongoing wheezing, need for oxygen and probable asthma. - Xopenex (DAW) q 6h scheduled, q3h as needed. No Atrovent since no dx of COPD. - Azithromycin 500 x 1 then 250 once daily x 2 additional days for bronchitis coverage. - Daily peak flows. - Will need outpatient PFT's to determine whether she does have COPD/asthma.  Problem # 2:  SINUS TACHYCARDIA (ICD-427.89) 2/2 cough and albuterol.   - Use Xopenex rather than albuterol.  - Continue telemetry. - Replete K to avoid arrhythmias. - Repeat ECG this AM.  Her updated medication list for this problem includes:    Aspir-low 81 Mg Tbec (Aspirin) .Marland Kitchen... Take 1 tablet by mouth once a day   Problem # 3:  HYPOKALEMIA (ICD-276.8) Pt's K was 2.8 on admission. She received 60 mEq by mouth and 10 mEq iv.  Most likely 2/2 albuterol.  - Replete by mouth and anticipate need for further supplementation given continued use of albuterol. - Check Mg and replete as needed.  - Check ECG to monitor for signs of hypokalemia. - Check BMET now.  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 Pt denies daily use of EtOh or hx of withdrawal.  - Nonetheless, will supplement folate and thiamine. - Keep possibility of withdrawal in mind if pt develops fevers or remains significantly tachycardic (in which case a benzo protocol would be appropriate).  Problem # 5:  DM (ICD-250.00) Pt not insulin dependent. Doesn't check CBG's at home. Last A1c was at goal less than a month ago.  - Continue metformin 500 two times a day. - Check CBGs three times a day AC and cover with a sensitive insulin sliding scale - anticipating hyperglycemia 2/2 steroid use.  Her updated medication list for this problem includes:    Metformin Hcl 500 Mg Tabs (Metformin hcl) .Marland Kitchen... Take 1 tablet by mouth twice a day    Aspir-low 81 Mg Tbec (Aspirin) .Marland Kitchen... Take 1 tablet by mouth once a day    Lisinopril 40 Mg Tabs (Lisinopril) .Marland Kitchen... Take 1 tablet by mouth once a day  Labs Reviewed: HgBA1c: 6.6 (10/13/2008)   Creat: 0.56 (05/21/2008)   Microalbumin: 1.13 (09/27/2006)   Complete Medication List: 1)  Catapres 0.1 Mg Tabs (Clonidine hcl) .... Take 1 tablet by mouth  twice a day 2)  Hydrochlorothiazide 25 Mg Tabs (Hydrochlorothiazide) .... Take 1 tablet by mouth once a day 3)  Metformin Hcl 500 Mg Tabs (Metformin hcl) .... Take 1 tablet by mouth twice a day 4)  Aspir-low 81 Mg Tbec (Aspirin) .... Take 1 tablet by mouth once a day 5)  Lisinopril 40 Mg Tabs (Lisinopril) .... Take 1 tablet by mouth once a day 6)  Norvasc 10 Mg Tabs (Amlodipine besylate) .... Take once a day for blood pressure 7)  Ibuprofen 600 Mg Tabs (Ibuprofen) .... Take one tablet three times a day for ankle pain for one week.   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 2D echo in 3/07 showed Hypokinesis of apical aspect of inferior septum and mid apical inferior wall.    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    Single.    Alcohol: Drinks 1/4 to 1/2 bottle of wine each day on each weekend and has recently cut back.    Tobacco: 1/2 pack a week.    Drugs: THC occasionally.    Review of Systems  General      Denies chills and loss of appetite.  CV      Denies fainting and swelling of feet.

## 2010-12-02 NOTE — Progress Notes (Signed)
Summary: refill/gg  Phone Note Refill Request  on February 23, 2010 11:09 AM  Refills Requested: Medication #1:  NORVASC 10 MG  TABS Take once a day for blood pressure   Last Refilled: 01/25/2010  Method Requested: Electronic Initial call taken by: Merrie Roof RN,  February 23, 2010 11:09 AM    Prescriptions: NORVASC 10 MG  TABS (AMLODIPINE BESYLATE) Take once a day for blood pressure  #32 x 6   Entered and Authorized by:   Elby Showers MD   Signed by:   Elby Showers MD on 02/23/2010   Method used:   Electronically to        [facility] [phone]* (retail)       [address]       [place], [place]  [address]       Ph: [phone]       Fax: [phone]   RxID:   [id number]

## 2010-12-02 NOTE — Progress Notes (Signed)
Summary: Refill/gh  Phone Note Refill Request Message from:  Fax from Pharmacy on June 02, 2009 4:14 PM  Refills Requested: Medication #1:  CATAPRES 0.1 MG TABS Take 1 tablet by mouth  twice a day   Last Refilled: 05/06/2009  Medication #2:  NORVASC 10 MG  TABS Take once a day for blood pressure   Last Refilled: 05/06/2009  Method Requested: Electronic Initial call taken by: Angelina Ok RN,  June 02, 2009 4:14 PM    Prescriptions: NORVASC 10 MG  TABS (AMLODIPINE BESYLATE) Take once a day for blood pressure  #32 x 3   Entered and Authorized by:   Elby Showers MD   Signed by:   Elby Showers MD on 06/04/2009   Method used:   Electronically to        [facility] [phone]* (retail)       [address]       [place], [place]  [address]       Ph: [phone]       Fax: [phone]   RxID:   [id number] CATAPRES 0.1 MG TABS (CLONIDINE HCL) Take 1 tablet by mouth  twice a day  #64 x 5   Entered and Authorized by:   Elby Showers MD   Signed by:   Elby Showers MD on 06/04/2009   Method used:   Electronically to        [facility] [phone]* (retail)       [address]       [place], [place]  [address]       Ph: [phone]       Fax: [phone]   RxID:   [id number]

## 2010-12-02 NOTE — Assessment & Plan Note (Signed)
Summary: acute-HFU PER DR KARIMOVA/CFB   Vital Signs:  Patient profile:   65 year old female Height:      62 inches (157.48 cm) Weight:      249.04 pounds (113.20 kg) BMI:     45.71 Temp:     97.6 degrees F (36.44 degrees C) oral Pulse rate:   79 / minute BP sitting:   169 / 102  (right arm) Cuff size:   large  Vitals Entered By: Angelina Ok RN (April 30, 2010 9:53 AM) CC: Depression Is Patient Diabetic? Yes Did you bring your meter with you today? No Pain Assessment Patient in pain? yes     Location: right arm and shoulder Intensity: 5 Type: tingling Onset of pain  Constant when she moves around and when she lays on her right side. Nutritional Status BMI of > 30 = obese CBG Result 137  Have you ever been in a relationship where you felt threatened, hurt or afraid?No   Does patient need assistance? Functional Status Self care Ambulation Normal Comments Check up.   Primary Care Provider:  Elby Showers MD  CC:  Depression.  History of Present Illness: Patient is a 65 yo female with PMH as described in EMRmost significant for irregular heart rhythm and recent admission to the hospital for the same is here today for a hospital follow up.  Patient complains of some pain in the right arm along with tingling sensations on and off all the way upto her neck.  No chest pain, SOB, palpitations.  She does feel that she has anxiety and her fingers would freeze briefly for few minutes before getting beck to normal.   No other complaints today and has an appointment with Dr Johney Frame on July 13th. I explained about the ablation procedure that cardiologists are considering on her and tried to answer her questions to the best of my knowledge.  She has stopped smoking and drinking and I congratulated her on for that.  Depression History:      The patient is having a depressed mood most of the day and has a diminished interest in her usual daily activities.        The patient  denies that she feels like life is not worth living, denies that she wishes that she were dead, and denies that she has thought about ending her life.         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 Take One Tablet Two Times A Day. 2)  Coreg 12.5 Mg Tabs (Carvedilol) .... Take 1 Tablet By Mouth Two Times A Day 3)  Benicar 20 Mg Tabs (Olmesartan Medoxomil) .... Take 1 Tablet By Mouth Once A Day 4)  Zocor 20 Mg Tabs (Simvastatin) .... Take 1 Tab By Mouth At Bedtime 5)  Aspirin 325 Mg Tabs (Aspirin) .... Take 1 Tablet By Mouth Once A Day 6)  Sertraline Hcl 50 Mg Tabs (Sertraline Hcl) .... Take 1 Tablet By Mouth Once A Day 7)  Cardizem Cd 360 Mg Xr24h-Cap (Diltiazem Hcl Coated Beads) .... Take 1 Tablet By Mouth Once A Day 8)  Alprazolam 0.25 Mg Tabs (Alprazolam) .... Take 1 Tablet By Mouth Two Times A Day As  Needed  Current Medications (verified): 1)  Glipizide-Metformin Hcl 5-500 Mg Tabs (Glipizide-Metformin Hcl) .... Take One Tablet Two Times A Day. 2)  Carvedilol 25 Mg Tabs (Carvedilol) .... Take 1 Tablet By Mouth Two Times A Day 3)  Benicar 20 Mg Tabs (Olmesartan Medoxomil) .... Take 1 Tablet By Mouth Once A Day 4)  Zocor 20 Mg Tabs (Simvastatin) .... Take 1 Tab By Mouth At Bedtime 5)  Aspirin 325 Mg Tabs (Aspirin) .... Take 1 Tablet By Mouth Once A Day 6)  Sertraline Hcl 50 Mg Tabs (Sertraline Hcl) .... Take 1 Tablet By Mouth Once A Day 7)  Cardizem Cd 360 Mg Xr24h-Cap (Diltiazem Hcl Coated Beads) .... Take 1 Tablet By Mouth Once A Day 8)  Alprazolam 0.25 Mg Tabs (Alprazolam) .... Take 1 Tablet By Mouth Two Times A Day As Needed  Allergies (verified): 1)  ! 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 2D echo in 3/07 showed Hypokinesis of apical aspect of inferior septum and mid apical inferior wall. 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 Alcohol: Drinks 2-3 drinks about one day per week. Tobacco: 1/2 pack a week, down to about 1 cigarette per week since discharge in jan 2010 Drugs: THC occasionally.  Risk Factors: Alcohol Use: <1 (04/30/2010) Exercise: yes (10/19/2009)  Risk Factors: Smoking Status: quit (04/30/2010) Packs/Day: Occ (04/30/2010)  Family History: Reviewed history from 12-28-2006 and no changes  required. Mother: CAD, passed away at the age of 78yrs Father:Lung Ca Sister: Complications of DM Brother:Renal failure, CVA, deceased  Social History: Reviewed history from 11/20/2008 and no changes required. 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 Alcohol: Drinks 2-3 drinks about one day per week. Tobacco: 1/2 pack a week, down to about 1 cigarette per week since discharge in jan 2010 Drugs: THC occasionally.  Review of Systems      See HPI  Physical Exam  Additional Exam:  Gen: AOx3, in no acute distress Eyes: PERRL, EOMI ENT:MMM, No erythema noted in posterior pharynx Neck: No JVD, No LAP Chest: CTAB with  good respiratory effort CVS: regular rhythmic rate, NO M/R/G, S1 S2 normal Abdo: soft,ND, BS+x4, Non tender  and No hepatosplenomegaly EXT: No odema noted Neuro: Non focal, gait is normal Skin: no rashes noted.    Impression & Recommendations:  Problem # 1:  HYPOKALEMIA (ICD-276.8) Assessment Comment Only Patient was hypokalemic while she was hospitalized and has a long standing history of being hypokalemic. She is not on any diuretics at this time. I will check her Bmet to make sure her K is in within normal range. Orders: T-Basic Metabolic Panel [id number])  Problem # 2:  HYPERLIPIDEMIA (ICD-272.4) Assessment: Improved HDL 98 and LDL 75 from the hospitalization FLP. I will continue her on the current meds and check FLP in 1 year. Her updated medication list for this problem includes:    Zocor 20 Mg Tabs (Simvastatin) .Marland Kitchen... Take 1 tab by mouth at bedtime  Problem # 3:  ANXIETY DEPRESSION (ICD-300.4) Assessment: Comment Only Patient does complain of some anxiety while her friends force her to go out and wants her to do things that she does not want to do. I asked her to take her Alprazolam as and when needed for anxiety. No suicidal or homicidal ideation.  Problem # 4:  SCREENING MAMMOGRAM NEC (ICD-V76.12) Assessment: Comment  Only Scheduled her for a mammogram.  Problem # 5:  Hx of ESSENTIAL HYPERTENSION (ICD-401.9) Patient BP has been very difficult to manage even in the past. I consulted with Dr Coralee Pesa and increased her Coreg from 12.5 two times a day to 25mg  two times a day. This is something which can be followed at her cardiology visit. Her updated medication list for this problem includes:    Carvedilol 25 Mg Tabs (Carvedilol) .Marland Kitchen... Take 1 tablet by mouth two times a day    Benicar 20 Mg Tabs (Olmesartan medoxomil) .Marland Kitchen... Take 1 tablet by mouth once a day    Cardizem Cd 360 Mg Xr24h-cap (Diltiazem hcl coated beads) .Marland Kitchen... Take 1 tablet by mouth once a day  BP today: 169/102 Prior BP: 177/112 (10/19/2009)  Prior 10 Yr Risk Heart Disease: 20 % (02/12/2007)  Labs Reviewed: K+: 4.0 (01/12/2009) Creat: : 0.66 (01/12/2009)   Chol: 187 (01/12/2009)   HDL: 84 (01/12/2009)   LDL: 82 (01/12/2009)   TG: 107 (01/12/2009)  Problem # 6:  UNSPECIFIED TACHYCARDIA (ICD-785.0) Assessment: Unchanged I answered her questions regarding the ablation procedure that DR Allred is planning on her. She was in regular rate of 79 while she was in the clinic. Dr Johney Frame to follow up on July 13th.  Problem # 7:  DM (ICD-250.00) Assessment: Improved Well controlled with HBa1c 5.8. Refer her for an  opthalmology referral. Her updated medication list for this problem includes:    Glipizide-metformin Hcl 5-500 Mg Tabs (Glipizide-metformin hcl) .Marland Kitchen... Take one tablet two times a day.    Benicar 20 Mg Tabs (Olmesartan medoxomil) .Marland Kitchen... Take 1 tablet by mouth once a day    Aspirin 325 Mg Tabs (Aspirin) .Marland Kitchen... Take 1 tablet by mouth once a day  Orders: Ophthalmology Referral (Ophthalmology)  Labs Reviewed: Creat: 0.66 (01/12/2009)     Last Eye Exam: No diabetic retinopathy OU.   Glaucoma Suspect.   Visual acuity OD:     CF 2 ft Visual acuity OS:     20/40 Intraocular pressure OD:     24 Intraocular pressure OS:     20 Optic nerve  head neuropathy OD  (01/07/2009) Reviewed HgBA1c results: 5.8 (10/19/2009)  8.2 (01/12/2009)  Complete Medication List: 1)  Glipizide-metformin Hcl 5-500 Mg Tabs (Glipizide-metformin hcl) .... Take one tablet two times  a day. 2)  Carvedilol 25 Mg Tabs (Carvedilol) .... Take 1 tablet by mouth two times a day 3)  Benicar 20 Mg Tabs (Olmesartan medoxomil) .... Take 1 tablet by mouth once a day 4)  Zocor 20 Mg Tabs (Simvastatin) .... Take 1 tab by mouth at bedtime 5)  Aspirin 325 Mg Tabs (Aspirin) .... Take 1 tablet by mouth once a day 6)  Sertraline Hcl 50 Mg Tabs (Sertraline hcl) .... Take 1 tablet by mouth once a day 7)  Cardizem Cd 360 Mg Xr24h-cap (Diltiazem hcl coated beads) .... Take 1 tablet by mouth once a day 8)  Alprazolam 0.25 Mg Tabs (Alprazolam) .... Take 1 tablet by mouth two times a day as needed  Other Orders: Capillary Blood Glucose/CBG ([id number])  Patient Instructions: 1)  Please schedule a follow-up appointment as needed. 2)  Please schedule an appointment with your primary doctor in 3-6 months. 3)  It is important that you exercise regularly at least 20 minutes 5 times a week. If you develop chest pain, have severe difficulty breathing, or feel very tired , stop exercising immediately and seek medical attention. 4)  You need to lose weight. Consider a lower calorie diet and regular exercise.  5)  Take an Aspirin every day. 6)  Check your blood sugars regularly. If your readings are usually above :200 or below 70 you should contact our office. 7)  It is important that your Diabetic A1c level is checked every 3 months. 8)  See your eye doctor yearly to check for diabetic eye damage. 9)  Check your feet each night for sore areas, calluses or signs of infection. 10)  Check your Blood Pressure regularly. If it is above: 130/80 you should make an appointment. Prescriptions: CARVEDILOL 25 MG TABS (CARVEDILOL) Take 1 tablet by mouth two times a day  #62 x 11   Entered and  Authorized by:   Lars Mage MD   Signed by:   Lars Mage MD on 04/30/2010   Method used:   Print then Give to Patient   RxID:   [id number]   Prevention & Chronic Care Immunizations   Influenza vaccine: Not documented   Influenza vaccine deferral: Deferred  (04/30/2010)    Tetanus booster: Not documented   Td booster deferral: Deferred  (04/30/2010)    Pneumococcal vaccine: Not documented   Pneumococcal vaccine deferral: Deferred  (04/30/2010)    H. zoster vaccine: Not documented   H. zoster vaccine deferral: Deferred  (04/30/2010)  Colorectal Screening   Hemoccult: Not documented   Hemoccult action/deferral: Deferred  (04/30/2010)    Colonoscopy: Not documented   Colonoscopy action/deferral: Deferred  (04/30/2010)  Other Screening   Pap smear: Not documented   Pap smear action/deferral: Refused  (04/30/2010)    Mammogram: Not documented   Mammogram action/deferral: Ordered  (04/30/2010)    DXA bone density scan: Not documented   DXA bone density action/deferral: Deferred  (04/30/2010)   Smoking status: quit  (04/30/2010)  Diabetes Mellitus   HgbA1C: 5.8  (10/19/2009)    Eye exam: No diabetic retinopathy OU.   Glaucoma Suspect.   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   (01/07/2009)   Diabetic eye exam action/deferral: Ophthalmology referral  (04/30/2010)   Eye exam due: 02/2010    Foot exam: yes  (10/19/2009)  High risk foot: Not documented   Foot care education: Not documented    Urine microalbumin/creatinine ratio: 8.4  (09/27/2006)   Urine microalbumin action/deferral: Ordered    Diabetes flowsheet reviewed?: Yes   Progress toward A1C goal: At goal  Lipids   Total Cholesterol: 187  (01/12/2009)   LDL: 82  (01/12/2009)   LDL Direct: Not documented   HDL: 84  (01/12/2009)   Triglycerides: 107  (01/12/2009)    SGOT (AST): 12   (01/12/2009)   SGPT (ALT): 11  (01/12/2009)   Alkaline phosphatase: 39  (01/12/2009)   Total bilirubin: 0.5  (01/12/2009)    Lipid flowsheet reviewed?: Yes   Progress toward LDL goal: At goal  Hypertension   Last Blood Pressure: 169 / 102  (04/30/2010)   Serum creatinine: 0.66  (01/12/2009)   Serum potassium 4.0  (01/12/2009)    Hypertension flowsheet reviewed?: Yes   Progress toward BP goal: Deteriorated  Self-Management Support :    Patient will work on the following items until the next clinic visit to reach self-care goals:     Medications and monitoring: take my medicines every day, bring all of my medications to every visit, examine my feet every day  (04/30/2010)     Eating: drink diet soda or water instead of juice or soda, eat more vegetables, eat foods that are low in salt, eat baked foods instead of fried foods, eat fruit for snacks and desserts, limit or avoid alcohol  (04/30/2010)     Activity: take a 30 minute walk every day  (04/30/2010)    Diabetes self-management support: Education handout, Psychologist, forensic, Resources for patients handout, Written self-care plan  (04/30/2010)   Diabetes care plan printed   Diabetes education handout printed    Hypertension self-management support: Education handout, Pre-printed educational material, Resources for patients handout, Written self-care plan  (04/30/2010)   Hypertension self-care plan printed.   Hypertension education handout printed    Lipid self-management support: Education handout, Pre-printed educational material, Resources for patients handout, Written self-care plan  (04/30/2010)   Lipid self-care plan printed.   Lipid education handout printed      Resource handout printed.   Nursing Instructions: Refer for screening diabetic eye exam (see order)   Process Orders Check Orders Results:     [facility]: Check successful Tests Sent for requisitioning (April 30, 2010 11:44  AM):     04/30/2010: [facility] -- T-Basic Metabolic Panel [id number] (signed)     Vital Signs:  Patient profile:   65 year old female Height:      62 inches (157.48 cm) Weight:      249.04 pounds (113.20 kg) BMI:     45.71 Temp:     97.6 degrees F (36.44 degrees C) oral Pulse rate:   79 / minute BP sitting:   169 / 102  (right arm) Cuff size:   large  Vitals Entered By: Angelina Ok RN (April 30, 2010 9:53 AM)

## 2010-12-02 NOTE — Assessment & Plan Note (Signed)
Summary: eph/jml   Visit Type:  Initial Consult Primary Provider:  Jillyn Hidden, md   History of Present Illness: Allison Caldwell is a 65 yo AAF with a history of medical noncompliance, HTN, and recently diagnosed SVT who presents today for follow-up.  During her recent hospitalization, she was documented to have a narrow complex tachycardia.  She had a low risk myoview at that time.  I discussed catheter ablation with her, and she was very clear in her decision to avoid the procedure.  She reports heavy ETOH at the time and feels that this may have precipitated her SVT.   She reports doing well since leaving the hospital, without further symtpomatic SVT.The patient denies symptoms of palpitations, chest pain, shortness of breath, orthopnea, PND, lower extremity edema, dizziness, presyncope, syncope, or neurologic sequela. The patient is tolerating medications without difficulties and is otherwise without complaint today.    Current Medications (verified): 1)  Glipizide-Metformin Hcl 5-500 Mg Tabs (Glipizide-Metformin Hcl) .... Take One Tablet Two Times A Day. 2)  Carvedilol 25 Mg Tabs (Carvedilol) .... Take 1 Tablet By Mouth Two Times A Day 3)  Benicar 20 Mg Tabs (Olmesartan Medoxomil) .... Take 1 Tablet By Mouth Once A Day 4)  Zocor 20 Mg Tabs (Simvastatin) .... Take 1 Tab By Mouth At Bedtime 5)  Aspirin 325 Mg Tabs (Aspirin) .... Take 1 Tablet By Mouth Once A Day 6)  Sertraline Hcl 50 Mg Tabs (Sertraline Hcl) .... Take 1 Tablet By Mouth Once A Day 7)  Cardizem Cd 360 Mg Xr24h-Cap (Diltiazem Hcl Coated Beads) .... Take 1 Tablet By Mouth Once A Day 8)  Alprazolam 0.25 Mg Tabs (Alprazolam) .... Take 1 Tablet By Mouth Two Times A Day As Needed  Allergies: 1)  ! 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 2D echo in 3/07 showed Hypokinesis of apical aspect of inferior septum and mid apical  inferior wall. AOCD - Ferritin 47, Iron 101, %saturation 31 Depression.   Degenerative joint disease.  Noncompliance. ETOH abuse  Past Surgical History: Reviewed history from 01/12/2009 and no changes required. ORIF right ankle surgical repair left wrist Caesarean section x 2 laser surgery right eye 2010  Family History: Reviewed history from 12/27/2006 and no changes required. 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  Lives in [place] Alcohol: heavy ETOH, but denies drinking since 6/11 Tobacco: 1/2 pack a week, reports quit since 6/11 Drugs: THC occasionally.  Review of Systems       All systems are reviewed and negative except as listed in the HPI.   Vital Signs:  Patient profile:   65 year old female Height:      62 inches Weight:      249 pounds BMI:     45.71 Pulse rate:   80 / minute BP sitting:   156 / 118  (left arm)  Vitals Entered By: Allison Caldwell CMA (May 12, 2010 11:32 AM)  Physical Exam  General:  obese, NAD Head:  normocephalic and atraumatic Eyes:  PERRLA/EOM intact; conjunctiva and lids normal. Mouth:  Teeth, gums and palate normal. Oral mucosa normal. Neck:  Neck supple, no JVD. No masses, thyromegaly or abnormal cervical nodes. Lungs:  Clear bilaterally to auscultation and percussion. Heart:  Non-displaced PMI, chest non-tender; regular rate  and rhythm, S1, S2 without murmurs, rubs or gallops. Carotid upstroke normal, no bruit. Normal abdominal aortic size, no bruits. Femorals normal pulses, no bruits. Pedals normal pulses. No edema, no varicosities. Abdomen:  Bowel sounds positive; abdomen soft and non-tender without masses, organomegaly, or hernias noted. No hepatosplenomegaly. Msk:  Back normal, normal gait. Muscle strength and tone normal. Pulses:  pulses normal in all 4  extremities Extremities:  No clubbing or cyanosis. Neurologic:  Alert and oriented x 3. Skin:  Intact without lesions or rashes. Psych:  Normal affect.   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 with coreg and cardizem she continues to defer catheter ablation  Problem # 2:  HYPERTENSION, BENIGN (ICD-401.1) above goal we will add hctz today she will contact her PCP for BMET in 6 weeks salt restriction advised  Problem # 3:  ABUSE, ALCOHOL, UNSPECIFIED (ICD-305.00) smoking cessation and ETOH cessation were encouraged at length today  Problem # 4:  Hx of MORBID OBESITY (ICD-278.01) weight loss is advised  Patient Instructions: 1)  Your physician recommends that you schedule a follow-up appointment in: 4 months with Dr Johney Frame 2)  Your physician recommends that you return for lab work in: 6 weeks with your primary doctor (BMP) 3)  Your physician has recommended you make the following change in your medication: start HCTZ 25mg  daily Prescriptions: HYDROCHLOROTHIAZIDE 25 MG TABS (HYDROCHLOROTHIAZIDE) one by mouth once daily  #30 x 11   Entered by:   Dennis Bast, RN, BSN   Authorized by:   Hillis Range, MD   Signed by:   Dennis Bast, RN, BSN on 05/12/2010   Method used:   Electronically to        [facility]  [address] [id number] (retail)       [address]       [place], [place]  [address]       Ph: [phone]       Fax: [phone]   RxID:   [id number]

## 2010-12-02 NOTE — Progress Notes (Signed)
Summary: Mag-ox & Lisinopril [facility]  Phone Note Outgoing Call   Call placed by: Youlanda Roys RN,  December 28, 2006 3:53 PM Call placed to: Patient Action Taken: Phone Call Completed Reason for Call: Discuss lab or test results Summary of Call: 12/28/06 3:50PM Talked with pt about calling in 2 prescriptions per Dr Allena Katz. Mag-ox 400mg  #60 x 3 ( 1 tablet two times a day) and Lisinopril 20mg  #30 x 0 ( 1 tablet daily) to [facility]. ..................................................................Marland KitchenDebra Elyse Prevo RN  December 28, 2006 3:56 PM

## 2010-12-02 NOTE — Progress Notes (Signed)
Summary: refill/ hla  Phone Note Refill Request Message from:  Fax from Pharmacy on June 24, 2008 11:57 AM  Refills Requested: Medication #1:  LISINOPRIL 40 MG TABS Take 1 tablet by mouth once a day   Last Refilled: 7/23 Initial call taken by: Marin Roberts RN,  June 24, 2008 11:58 AM  Follow-up for Phone Call        Rx sent. Follow-up by: Ned Grace MD,  June 26, 2008 3:28 PM      Prescriptions: LISINOPRIL 40 MG TABS (LISINOPRIL) Take 1 tablet by mouth once a day  #30 x 5   Entered by:   Ned Grace MD   Authorized by:   Elby Showers MD   Signed by:   Ned Grace MD on 06/26/2008   Method used:   Electronically to        [facility]* (retail)       [address]       [place], [place]  [address]       Ph: [phone]       Fax: [phone]   RxID:   [id number]

## 2010-12-02 NOTE — Miscellaneous (Signed)
Summary: [facility]  [facility]   Imported By: Florinda Marker 04/09/2009 15:26:39  _____________________________________________________________________  External Attachment:    Type:   Image     Comment:   External Document

## 2010-12-02 NOTE — Consult Note (Signed)
Summary: [facility]: Eye Exam  [facility]: Eye Exam   Imported By: Florinda Marker 01/19/2009 15:45:03  _____________________________________________________________________  External Attachment:    Type:   Image     Comment:   External Document  Appended Document: [facility]: Eye Exam    Clinical Lists Changes  Problems: Added new problem of GLAUCOMA (ICD-365.9) Orders: Added new Referral order of Ophthalmology Referral (Ophthalmology) - Signed      Appended Document: [facility]: Eye Exam    Clinical Lists Changes  Observations: Added new observation of [id number]: 12/2009 (02/05/2009 15:36) Added new observation of DIAB EYE EX: No diabetic retinopathy OU.   Glaucoma Suspect.   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  (01/07/2009 15:41)       Diabetic Eye Exam  Procedure date:  01/07/2009  Findings:      No diabetic retinopathy OU.   Glaucoma Suspect.   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   Glaucoma Suspect.   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

## 2010-12-02 NOTE — Assessment & Plan Note (Signed)
Summary: HFU-NO LABS ORDERED/([facility])/[facility]   Vital Signs:  Patient Profile:   65 Years Old Female Height:     62.5 inches (158.75 cm) Weight:      250.7 pounds (113.95 kg) BMI:     45.29 Temp:     99.0 degrees F (37.22 degrees C) oral Pulse rate:   78 / minute BP sitting:   133 / 96  (right arm)  Pt. in pain?   no  Vitals Entered By: Filomena Jungling NT II (November 20, 2008 2:24 PM)              Is Patient Diabetic? Yes Nutritional Status BMI of 25 - 29 = overweight  Have you ever been in a relationship where you felt threatened, hurt or afraid?No   Does patient need assistance? Functional Status Self care Ambulation Normal     PCP:  Elby Showers MD  Chief Complaint:  HFU.  History of Present Illness: Allison Caldwell is a 65 year old Female with PMH listed below, recently admitted for acute bronchitis.  who presents to clinic today for  hospital followup  she complains of a lingering cough, but overall she is doing better since discharge. She completed a prednisone taper and a 10 day course of doxycycline.   She has been drinking less, about 3-4 drinks one day a week.    Prior Medications Reviewed Using: List Brought by Patient  Updated Prior Medication List: CATAPRES 0.1 MG TABS (CLONIDINE HCL) Take 1 tablet by mouth  twice a day HYDROCHLOROTHIAZIDE 25 MG TABS (HYDROCHLOROTHIAZIDE) Take 1 tablet by mouth once a day METFORMIN HCL 500 MG TABS (METFORMIN HCL) Take 1 tablet by mouth twice a day ASPIR-LOW 81 MG TBEC (ASPIRIN) Take 1 tablet by mouth once a day LISINOPRIL 40 MG TABS (LISINOPRIL) Take 1 tablet by mouth once a day NORVASC 10 MG  TABS (AMLODIPINE BESYLATE) Take once a day for blood pressure  Current Allergies: No known allergies   Past Medical History:    Reviewed history from 11/02/2008 and no changes required:       HTN - poorly controlled       NIDDM       Morbid obesity       H/O TIA       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 2D echo in 3/07  showed Hypokinesis of apical aspect of inferior septum and mid apical inferior wall.       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       Single.       Alcohol: Drinks 2-3 drinks about one day per week.       Tobacco: 1/2 pack a week, down to about 1 cigarette per week since discharge in jan 2010       Drugs: THC occasionally.   Risk Factors:  Tobacco use:  current    Cigarettes:  Yes -- Occ pack(s) per day    Counseled to quit/cut down tobacco use:  yes Alcohol  use:  yes    Type:  beer/wine    Drinks per day:  <1 Exercise:  yes   Review of Systems  General      Denies chills and fever.  Eyes      Denies blurring and double vision.  ENT      Denies decreased hearing and difficulty swallowing.  CV      Denies chest pain or discomfort and palpitations.  Resp      dyspnea on exertion and cough have improved since discharge but not completely resolved.  GI      Denies change in bowel habits, constipation, and diarrhea.  GU      Denies hematuria.  MS      Denies joint pain, muscle weakness, and stiffness.  Derm      Denies rash.  Neuro      Denies numbness and weakness.  Psych      Denies anxiety and depression.  Endo      Complains of excessive hunger.      Denies cold intolerance and heat intolerance.      notes relation to prednisone with increased hunger   Physical Exam  General:     alert and overweight-appearing.   Head:     normocephalic and atraumatic.   Eyes:     vision grossly intact. 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   Nose:     no external deformity.   Mouth:     pharynx pink and moist.   Lungs:     normal respiratory effort, normal breath sounds, no crackles, and no wheezes.   Heart:     normal rate, regular rhythm, and no murmur.   Abdomen:     soft, non-tender, and normal bowel sounds.   Neurologic:     alert & oriented X3, cranial nerves II-XII intact, strength normal in all extremities, and gait normal.   Psych:     Oriented X3, normally interactive, good eye contact, and not anxious appearing.      Impression & Recommendations:  Problem # 1:  ACUTE BRONCHITIS (ICD-466.0) Her bronchitis appears to be resolving well, though she still has some lingering cough. We discussed that there is often a prolonged recovery from this type of illness. Given her complaint of chronic cough as well as the concern for possible reactive airway disease or COPD, will check PFTs. After discussion with Dr. Daiva Eves, would suggest low threshold for CT chest if PFTs not explanatory.   The following medications were removed from the medication list:    Doxycycline Monohydrate 100 Mg Caps (Doxycycline monohydrate) .Marland Kitchen... Take 1 tablet by mouth two times a day for 10 days  Orders: PFT Baseline-Pre/Post Bronchodiolator (PFT Baseline-Pre/Pos)   Problem # 2:  DM (ICD-250.00) Well controlled on current regimen, so continue. She reports that she plans to see an ophthalmologist in a month. Will arrange for her to see Jamison Neighbor about a meter.  Her updated medication list for this problem includes:    Metformin Hcl 500 Mg Tabs (Metformin hcl) .Marland Kitchen... Take 1 tablet by mouth twice a day    Aspir-low 81 Mg Tbec (Aspirin) .Marland Kitchen... Take 1 tablet by mouth once a day    Lisinopril 40 Mg Tabs (Lisinopril) .Marland Kitchen... Take 1 tablet by mouth once a day  Labs Reviewed: HgBA1c: 6.6 (10/13/2008)   Creat: 0.56 (05/21/2008)   Microalbumin: 1.13 (09/27/2006)  Orders: Diabetic Clinic Referral (  Diabetic)   Problem # 3:   Hx of ESSENTIAL HYPERTENSION (ICD-401.9) BP is well controlled, just barely above goal and really the only way to increase the meds would be to increase the dose of clonidine. Will monitor for now and readdress at next visit. Below is the patient's weight trend. This is the underlying problem and needs to be addressed. I will try to get her in for one of the [facility] clinics.  250.0 (10/13/2008 10:04:40 AM) 251.1 (05/21/2008 1:32:52 PM) 246.01 (07/09/2007 2:15:14 PM) 245.7 (05/14/2007 10:42:46 AM) 240.3 (02/12/2007 1:29:19 PM) 237.7 (12/27/2006 10:59:04 AM)    BP today: 133/96 Prior BP: 128/74 (11/02/2008)  Prior 10 Yr Risk Heart Disease: 20 % (02/12/2007)  Labs Reviewed: Creat: 0.56 (05/21/2008) Chol: 211 (12/27/2006)   HDL: 104 (12/27/2006)   LDL: 103  --  11/02/2008 (11/11/2008)   TG: 226 (12/27/2006)  Her updated medication list for this problem includes:    Catapres 0.1 Mg Tabs (Clonidine hcl) .Marland Kitchen... Take 1 tablet by mouth  twice a day    Hydrochlorothiazide 25 Mg Tabs (Hydrochlorothiazide) .Marland Kitchen... Take 1 tablet by mouth once a day    Lisinopril 40 Mg Tabs (Lisinopril) .Marland Kitchen... Take 1 tablet by mouth once a day    Norvasc 10 Mg Tabs (Amlodipine besylate) .Marland Kitchen... Take once a day for blood pressure   Problem # 4:  SCREENING FOR MALIGNANT NEOPLASM, COLON (ICD-V76.51) Per Dr. Claris Che last note, she needs referral for colonoscopy. Orders: Gastroenterology Referral (GI)   Problem # 5:  HYPERLIPIDEMIA (ICD-272.4) Her LDL is above goal given her DM and HTN, so will start her on pravastatin today, with lab visit in one month for a CMP.  Labs Reviewed: Chol: 211 (12/27/2006)   HDL: 104 (12/27/2006)   LDL: 103  --  11/02/2008 (11/11/2008)   TG: 226 (12/27/2006) SGOT: 13 (07/09/2007)   SGPT: 12 (07/09/2007)  Prior 10 Yr Risk Heart Disease: 20 % (02/12/2007)  Future Orders: T-Comprehensive Metabolic Panel ([id number]) ... 11/21/2008  Her updated medication list for this problem  includes:    Pravastatin Sodium 20 Mg Tabs (Pravastatin sodium) ..... One by mouth at bedtime   Problem # 6:  HYPOKALEMIA (ICD-276.8) Will check BMET today given hypokalemia in the hospital. Orders: T-Basic Metabolic Panel [phone])   Complete Medication List: 1)  Catapres 0.1 Mg Tabs (Clonidine hcl) .... Take 1 tablet by mouth  twice a day 2)  Hydrochlorothiazide 25 Mg Tabs (Hydrochlorothiazide) .... Take 1 tablet by mouth once a day 3)  Metformin Hcl 500 Mg Tabs (Metformin hcl) .... Take 1 tablet by mouth twice a day 4)  Aspir-low 81 Mg Tbec (Aspirin) .... Take 1 tablet by mouth once a day 5)  Lisinopril 40 Mg Tabs (Lisinopril) .... Take 1 tablet by mouth once a day 6)  Norvasc 10 Mg Tabs (Amlodipine besylate) .... Take once a day for blood pressure 7)  Pravastatin Sodium 20 Mg Tabs (Pravastatin sodium) .... One by mouth at bedtime   Patient Instructions: 1)  Please schedule a follow-up appointment in 2 months, preferably with Dr. Clent Ridges. 2)  Please schedule a Lab only appointment in 1 month (CMET). 3)  Please schedule an appointment with our diabetes educator 4)  Tobacco is very bad for your health and your loved ones! You Should stop smoking!. 5)  Stop Smoking Tips: Choose a Quit date. Cut down before the Quit date. decide what you will do as a substitute when you feel the urge to smoke(gum,toothpick,exercise). 6)  You need  to lose weight. Consider a lower calorie diet and regular exercise.  7)  It is not healthy  for men to drink more than 2-3 drinks per day or for women to drink more than 1-2 drinks per day.   Prescriptions: PRAVASTATIN SODIUM 20 MG  TABS (PRAVASTATIN SODIUM) one by mouth at bedtime  #90 x 0   Entered and Authorized by:   Loel Dubonnet MD   Signed by:   Loel Dubonnet MD on 11/20/2008   Method used:   Print then Give to Patient   RxID:   [id number] PRAVASTATIN SODIUM 20 MG  TABS (PRAVASTATIN SODIUM) one by mouth at bedtime  #90 x 0   Entered and  Authorized by:   Loel Dubonnet MD   Signed by:   Loel Dubonnet MD on 11/20/2008   Method used:   Print then Give to Patient   RxID:   [id number] PRAVASTATIN SODIUM 20 MG  TABS (PRAVASTATIN SODIUM) one by mouth at bedtime  #90 x 3   Entered and Authorized by:   Loel Dubonnet MD   Signed by:   Loel Dubonnet MD on 11/20/2008   Method used:   Print then Give to Patient   RxID:   [id number]   Appended Document: HFU-NO LABS ORDERED/([facility])/CFB Dr. Janyth Pupa discussed this patient with me. I am always wary of diagnosis of RAD in patients this late in life. My concern is for undiagnosed malignancy.

## 2010-12-02 NOTE — Assessment & Plan Note (Signed)
Summary: Hospital Admission  INTERNAL MEDICINE ADMISSION HISTORY AND PHYSICAL PCP:Dr. Clent Ridges R1[place]enton Meek [phone] R2: [place] [phone] CC: SOB and numbness in her lower face and both hands.  QIO:[id number] started at 10 am on a day of admission while the patient was washing the dishes. She felt that both cheeks, chin and both hands began to tingle. she denies any numbness; HA, visual or speech deficits or weakness. She denies any CP, fever, chills, diaphoresis. she states that she did develop mild SOB at the time of the episode. Denies any recent travel, immobilzation. Patient was able to speak and called 911. Upon EMS arrival, she was administered Oxygen via [place] that seemed to improved her Symptoms. Admits being unders stress with her children. Denies SI/HI or mania. Patient denies having similar episodes in the past, even when had a TIA many years ago. she states that she is compliant with her medications except Lisinopril; and she does not checks her blood glucose levels.   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 2D echo in 3/07 showed Hypokinesis of apical aspect of inferior septum and mid apical inferior wall. AOCD - Ferritin 47, Iron 101, %saturation 31   MEDICATIONS: CATAPRES 0.1 MG TABS (CLONIDINE HCL) Take 1 tablet by mouth  twice a day HYDROCHLOROTHIAZIDE 25 MG TABS (HYDROCHLOROTHIAZIDE) Take 1 tablet by mouth once a day GLIPIZIDE-METFORMIN HCL 5-500 MG TABS (GLIPIZIDE-METFORMIN HCL) Take one tablet two times a day. ASPIR-LOW 81 MG TBEC (ASPIRIN) Take 1 tablet by mouth once a day NORVASC 10 MG  TABS (AMLODIPINE BESYLATE) Take once a day for blood pressure LISINOPRIL 20 MG TABS (LISINOPRIL) Take one tablet daily for blood pressure.   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 Alcohol: Drinks 2-3 drinks about one day per week. Tobacco: 1/2  pack a week, down to about 1 cigarette per week since discharge in jan 2010 Drugs: THC occasionally.   FAMILY HISTORY Mother: CAD, passed away at the age of 59yrs Father:Lung Ca Sister: Complications of DM Brother:Renal failure, CVA, deceased   ROS:per HPI VITALS: T: 98.0 P:88  BP:147/102  R:  O2SAT:  99%ON:2 L/min via [place] PHYSICAL EXAM: General:  alert, well-developed, and cooperative to examination.   Head:  normocephalic and atraumatic.   Eyes:  vision grossly intact, pupils equal, pupils round, pupils reactive to light, no injection and anicteric.   Mouth:  pharynx pink and moist, no erythema, and no exudates.   Neck:  supple, full ROM, no thyromegaly, no JVD, and no carotid bruits.   Lungs:  normal respiratory effort, no accessory muscle use, normal breath sounds, no crackles, and no wheezes.  CV: slightly tachycardiac, RR, no M, S3, S4, no lifts or rubs. Abdomen: ND; BS+, NTTP, no HSM MSK: FROM of all extremities proximately and distally bialterally; no joint erythema, effusion or increased warmth to touch bilaterally.  Neurologic:  alert & oriented X3, cranial nerves II-XII intact, strength normal in all extremities, sensation intact to light touch, and gait normal.   Skin:  turgor normal and no rashes.   Psych:  Oriented X3, memory intact for recent and remote, normally interactive, good eye contact, not anxious appearing, and not depressed appearing.  LABS: WBC  7.3               4.0-10.5         K/uL  RBC                                      5.31       h      3.87-5.11        MIL/uL  Hemoglobin (HGB)                         17.2       h      12.0-15.0        g/dL  Hematocrit (HCT)                         51.3       h      36.0-46.0        %  MCV                                      96.6              78.0-100.0       fL  MCHC                                     33.4              30.0-36.0        g/dL  RDW                                       14.9              11.5-15.5        %  Platelet Count (PLT)                     275               150-400          K/uL  Neutrophils, %                           72                43-77            %  Lymphocytes, %                           21                12-46            %  Monocytes, %                             6                 3-12             %  Eosinophils, %  0                 0-5              %  Basophils, %                             0                 0-1              %  Neutrophils, Absolute                    5.3               1.7-7.7          K/uL  Lymphocytes, Absolute                    1.5               0.7-4.0          K/uL  Monocytes, Absolute                      0.4               0.1-1.0          K/uL  Eosinophils, Absolute                    0.0               0.0-0.7          K/uL  Basophils, Absolute                      0.0               0.0-0.1          K/uL TCO2                                     29                0-100            mmol/L  Ionized Calcium                          0.93       l      1.12-1.32        mmol/L  Hemoglobin (HGB)                         18.7       h      12.0-15.0        g/dL  Hematocrit (HCT)                         55.0       h      36.0-46.0        %  Sodium (NA)                              135  135-145          mEq/L  Potassium (K)                            3.3        l      3.5-5.1          mEq/L  Chloride                                 94         l      96-112           mEq/L  Glucose                                  189        h      70-99            mg/dL  BUN                                      4          l      6-23             mg/dL  Creatinine                               0.5               0.4-1.2          mg/dL CKMB, POC                                1.9               1.0-8.0          ng/mL  Troponin I, POC                          <0.05             0.00-0.09        ng/mL   Myoglobin, POC                           246        H      12-200           ng/mL  Color, Urine                             YELLOW            YELLOW  Appearance                               CLEAR             CLEAR  Specific Gravity                         1.014  1.005-1.030  pH                                       6.5               5.0-8.0  Urine Glucose                            NEGATIVE          NEG              mg/dL  Bilirubin                                NEGATIVE          NEG  Ketones                                  15         a      NEG              mg/dL  Blood                                    NEGATIVE          NEG  Protein                                  100        a      NEG              mg/dL  Urobilinogen                             1.0               0.0-1.0          mg/dL  Nitrite                                  NEGATIVE          NEG  Leukocytes                               NEGATIVE          NEG Squamous Epithelial / LPF                RARE              RARE  WBC / HPF                                0-2               <3               WBC/hpf  RBC / HPF  0-2               <3               RBC/hpf  Urine-Other                              SEE NOTE.    TRICHOMONAS PRESENT  CXR: Findings: Mild cardiomegaly persists.  The aorta shows unfolding.   The lungs are clear.  The extreme lung apices are excluded from the   film.  The vascularity is normal.  No effusions.    IMPRESSION:   Cardiomegaly and vascular ectasia.  No active disease identified.  ASSESSMENT AND PLAN: (1)Symptomatic Tachycardia. Patient has multiple risk factors for CAD. Will cycle CE x3; 2-D ECHO in am.Based ont hefinding, will decide whether to involve Cardiology consult. Also, will need to r/o metabolic etiology: will check TSH, T4.Will start Coreg; and IVF at 75 cc/hr. (2)DM: Check A1c, Lipid panel. will cover with SSI; and Glipizide by mouth. (3)HTN: Coreg  6.25 by mouth two times a day; Hctz 25 by mouth daily; Lisinopril 5 mg by mouth daily. (4)Perioral and bilateral hands paresthesiasin a setting of a remote  Hx of TIA. No neurological deficits. Sx likely due to Anxiety disorder and depression. Patient admits being under stress due to her home situation. Denies HI/SI or mania. Will start SSRI. Consider checking Vitamin B12 and Folate levels to evluate for vitamin defficiency.. (5)Trichomonas infxn per UA -> 2 gm Flagyl PO; Will check HIV, RPR and Hepatitis panel. (6)VTE PROPH: lovenox

## 2010-12-02 NOTE — Miscellaneous (Signed)
Summary: [facility]  [facility]   Imported By: Florinda Marker 03/23/2009 15:00:51  _____________________________________________________________________  External Attachment:    Type:   Image     Comment:   External Document

## 2010-12-02 NOTE — Assessment & Plan Note (Signed)
Summary: EST-CK/FU/MEDS/CFB   Vital Signs:  Patient profile:   65 year old female Height:      62 inches Weight:      258.9 pounds BMI:     47.52 Temp:     97.2 degrees F oral Pulse rate:   67 / minute BP sitting:   145 / 87  (right arm)  Vitals Entered By: Filomena Jungling NT II (September 21, 2010 1:33 PM) CC: checkup Is Patient Diabetic? Yes Did you bring your meter with you today? No Pain Assessment Patient in pain? no      Nutritional Status BMI of > 30 = obese CBG Result 49  Does patient need assistance? Functional Status Self care Ambulation Normal   Diabetic Foot Exam Foot Inspection Is there a history of a foot ulcer?              No Is there a foot ulcer now?              No Can the patient see the bottom of their feet?          No Are the shoes appropriate in style and fit?          Yes Is there swelling or an abnormal foot shape?          No Are the toenails long?                No Are the toenails thick?                No Are the toenails ingrown?              No Is there heavy callous build-up?              No Is there pain in the calf muscle (Intermittent claudication) when walking?    NoIs there a claw toe deformity?              No Is there elevated skin temperature?            No Is there limited ankle dorsiflexion?            No Is there foot or ankle muscle weakness?            No  Diabetic Foot Care Education    10-g (5.07) Semmes-Weinstein Monofilament Test Performed by: Filomena Jungling NT II          Right Foot          Left Foot Visual Inspection               Test Control      normal         normal Site 1         normal         normal Site 2         normal         normal Site 3         normal         normal Site 4         normal         normal Site 5         normal         normal Site 6         normal         normal Site 7         normal  normal Site 8         normal         normal Site 9         normal         normal Site 10          normal         normal  Impression      normal         normal   Primary Care Provider:  Jaci Lazier MD  CC:  checkup.  History of Present Illness: Pt with pmh outlined below here for regular office visit. No complaints, except that she felt slightly dizzy.  DM - random glucose down to 49. A1C pending..States she had breakfast and usually has a snack in between before launch. She's on Glipizide/Metformin 5-500mg  BID which she took this am. She doesn't check her blood sugars at home and doesn't have a meter, so its hard to evaluate her bg trends so as to adjust her med. She will likely benefit from DM education as well.  HTN- Not at goal, on Coreg 25mg  BID, Benicar 20mg , HCTZ 25mg  and Cardizem 360mg . Advised strict lifestyle changes. BMP wnl in July. Recheck in one year.  HL- stable. FLP from hospital admission in June wnl. (tchol 182, tri 54, HDL 98, LDL 73). Repeat in one year.  Depression -stable on Sertraline.  PSVT - Followed by Dr. Johney Frame, next appt in December. Was seen by him in July, EKG back in sinus rythm. Advised to cont. Coreg and Cardizem.  Prevention - states she does not want a colonoscopy despite demonstrating understanding of the risks. WIll arrange a mammogram for her, she can't remember last one.  Depression History:      The patient denies a depressed mood most of the day and a diminished interest in her usual daily activities.         Preventive Screening-Counseling & Management  Alcohol-Tobacco     Alcohol drinks/day: <1     Alcohol type: beer/wine     Smoking Status: quit     Smoking Cessation Counseling: yes     Packs/Day: Occ     Year Quit: 2010, approx Jan     Pack years: 32  Caffeine-Diet-Exercise     Does Patient Exercise: yes  Current Problems (verified): 1)  Hypertension, Benign  (ICD-401.1) 2)  Psvt  (ICD-427.0) 3)  Anxiety Depression  (ICD-300.4) 4)  Glaucoma  (ICD-365.9) 5)  Unspecified Tachycardia  (ICD-785.0) 6)  Hyperlipidemia   (ICD-272.4) 7)  Hypokalemia  (ICD-276.8) 8)  Sinus Tachycardia  (ICD-427.89) 9)  Acute Bronchitis  (ICD-466.0) 10)  Ankle Pain, Right  (ICD-719.47) 11)  Abuse, Alcohol, 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 Take 1 Tablet By Mouth Two Times A Day 2)  Benicar 20 Mg Tabs (Olmesartan Medoxomil) .... Take 1 Tablet By Mouth Once A Day 3)  Zocor 20 Mg Tabs (Simvastatin) .... Take 1 Tab By Mouth At Bedtime 4)  Aspirin 325 Mg Tabs (Aspirin) .... Take  1 Tablet By Mouth Once A Day 5)  Sertraline Hcl 50 Mg Tabs (Sertraline Hcl) .... Take 1 Tablet By Mouth Once A Day 6)  Cardizem Cd 360 Mg Xr24h-Cap (Diltiazem Hcl Coated Beads) .... Take 1 Tablet By Mouth Once A Day 7)  Alprazolam 0.25 Mg Tabs (Alprazolam) .... Take 1 Tablet By Mouth Two Times A Day As Needed 8)  Hydrochlorothiazide 25 Mg Tabs (Hydrochlorothiazide) .... One By Mouth Once Daily 9)  Metformin Hcl 500 Mg Tabs (Metformin Hcl) .... Take One Tablet By Mouth Two Times A Day 10)  Glipizide 5 Mg Tabs (Glipizide) .... Take One Tablet By Mouth Once A Day  Allergies (verified): 1)  ! Ace Inhibitors  Review of Systems  The patient denies fever, chest pain, dyspnea on exertion, and unusual weight change.    Physical Exam  General:  alert and overweight-appearing.   Head:  normocephalic and atraumatic.   Eyes:  vision grossly intact.   Ears:  no external deformities.   Nose:  no external deformity.   Mouth:  has dentures Lungs:  normal  respiratory effort, normal breath sounds, no crackles, and no wheezes.   Heart:  normal rate, regular rhythm, no murmur, no gallop, and no rub.   Abdomen:  obese, soft and nontender Pulses:  normal DP pulses Extremities:  no edema Neurologic:  alert & oriented X3.   Skin:  color normal.   Psych:  Oriented X3 and normally interactive.    Diabetes Management Exam:    Foot Exam (with socks and/or shoes not present):       Sensory-Monofilament:          Left foot: normal          Right foot: normal   Impression & Recommendations:  Problem # 1:  DM (ICD-250.00) Changed her glipizide/metformin 5/500mg  two times a day and split it as below, cut the glipizide in half to 5mg  once daily due to hypoglycemia. Eye exam and foot exam all updated and intact. Urine microalbumin is also wnl.  The following medications were removed from the medication list:    Glipizide-metformin Hcl 5-500 Mg Tabs (Glipizide-metformin hcl) .Marland Kitchen... Take one tablet two times a day. Her updated medication list for this problem includes:    Benicar 20 Mg Tabs (Olmesartan medoxomil) .Marland Kitchen... Take 1 tablet by mouth once a day    Aspirin 325 Mg Tabs (Aspirin) .Marland Kitchen... Take 1 tablet by mouth once a day    Metformin Hcl 500 Mg Tabs (Metformin hcl) .Marland Kitchen... Take one tablet by mouth two times a day    Glipizide 5 Mg Tabs (Glipizide) .Marland Kitchen... Take one tablet by mouth once a day  Orders: T- Capillary Blood Glucose (13086) T-Hgb A1C (in-house) (57846NG) Diabetic Clinic Referral (Diabetic) T-Urine Microalbumin w/creat. ratio [phone])  Labs Reviewed: Creat: 0.50 (04/30/2010)     Last Eye Exam: No diabetic retinopathy OU.   Glaucoma Suspect.   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  (01/07/2009) Reviewed HgBA1c results: 6.7 (09/21/2010)  5.8 (10/19/2009)  Problem # 2:  HYPERTENSION, BENIGN (ICD-401.1) BP not  at goal given her DM. She does not want any changes to be made to her meds, so I encouraged strict compliance as well as to cont. lifestyle modification and then reassess in a month.  Her updated medication list  for this problem includes:    Carvedilol 25 Mg Tabs (Carvedilol) .Marland Kitchen... Take 1 tablet by mouth two times a day    Benicar 20 Mg Tabs (Olmesartan medoxomil) .Marland Kitchen... Take 1 tablet by mouth once a day    Cardizem Cd 360 Mg Xr24h-cap (Diltiazem hcl coated beads) .Marland Kitchen... Take 1 tablet by mouth once a day    Hydrochlorothiazide 25 Mg Tabs (Hydrochlorothiazide) ..... One by mouth once daily  BP today: 145/87 Prior BP: 156/118 (05/12/2010)  Prior 10 Yr Risk Heart Disease: 20 % (02/12/2007)  Labs Reviewed: K+: 4.2 (04/30/2010) Creat: : 0.50 (04/30/2010)   Chol: 182 (04/05/2010)   HDL: 98 (04/05/2010)   LDL: 73 (04/05/2010)   TG: 54 (04/05/2010)  Problem # 3:  HYPERLIPIDEMIA (ICD-272.4) Very well controlled. FLP not due until June 2012. No changes to statin, keep at current dose.  Her updated medication list for this problem includes:    Zocor 20 Mg Tabs (Simvastatin) .Marland Kitchen... Take 1 tab by mouth at bedtime  Labs Reviewed: SGOT: 12 (01/12/2009)   SGPT: 11 (01/12/2009)  Prior 10 Yr Risk Heart Disease: 20 % (02/12/2007)   HDL:98 (04/05/2010), 84 (01/12/2009)  LDL:73 (04/05/2010), 82 (01/12/2009)  Chol:182 (04/05/2010), 187 (01/12/2009)  Trig:54 (04/05/2010), 107 (01/12/2009)  Problem # 4:  PSVT (ICD-427.0) In sinus rythm, saw Dr. Johney Frame in July. Has f/u appt with him again in December. Cont current med regimen.  Her updated medication list for this problem includes:    Carvedilol 25 Mg Tabs (Carvedilol) .Marland Kitchen... Take 1 tablet by mouth two times a day    Aspirin 325 Mg Tabs (Aspirin) .Marland Kitchen... Take 1 tablet by mouth once a day  Problem # 5:  ANXIETY DEPRESSION (ICD-300.4) Stable. On Sertraline and Alprazolam.   Problem # 6:  Preventive Health Care (ICD-V70.0) Mammogram ordered today. Declined  colonoscopy despite demonstrating understanding risks.  Complete Medication List: 1)  Carvedilol 25 Mg Tabs (Carvedilol) .... Take 1 tablet by mouth two times a day 2)  Benicar 20 Mg Tabs (Olmesartan medoxomil) .... Take 1 tablet by mouth once a day 3)  Zocor 20 Mg Tabs (Simvastatin) .... Take 1 tab by mouth at bedtime 4)  Aspirin 325 Mg Tabs (Aspirin) .... Take 1 tablet by mouth once a day 5)  Sertraline Hcl 50 Mg Tabs (Sertraline hcl) .... Take 1 tablet by mouth once a day 6)  Cardizem Cd 360 Mg Xr24h-cap (Diltiazem hcl coated beads) .... Take 1 tablet by mouth once a day 7)  Alprazolam 0.25 Mg Tabs (Alprazolam) .... Take 1 tablet by mouth two times a day as needed 8)  Hydrochlorothiazide 25 Mg Tabs (Hydrochlorothiazide) .... One by mouth once daily 9)  Metformin Hcl 500 Mg Tabs (Metformin hcl) .... Take one tablet by mouth two times a day 10)  Glipizide 5 Mg Tabs (Glipizide) .... Take one tablet by mouth once a day  Other Orders: Mammogram (Screening) (Mammo)  Patient Instructions: 1)  Pls take all your medications exactly as prescribed. 2)  Your blood pressure is still higher than we would like it, make sure to take your high blood pressure medications regularly for the next 3 months. 3)  We are reducing your diabetes medication. You will now be taking Glipizide once a day and Metformin twice a day. 4)  Make sure to get your eye exam and mammogram as scheduled. 5)  Call us if you have any other questions or concerns. 6)  Please schedule a follow-up appointment in 3 months. Prescriptions: GLIPIZIDE 5 MG  TABS (GLIPIZIDE) take one tablet by mouth once a day  #60 x 3   Entered and Authorized by:   Jaci Lazier MD   Signed by:   Jaci Lazier MD on 09/22/2010   Method used:   Print then Give to Patient   RxID:   [id number] METFORMIN HCL 500 MG TABS (METFORMIN HCL) take one tablet by mouth two times a day  #60 x 3   Entered and Authorized by:   Jaci Lazier MD   Signed by:   Jaci Lazier MD on 09/21/2010   Method used:   Print then Give to Patient   RxID:   [id number] GLIPIZIDE 5 MG TABS (GLIPIZIDE) take one tablet by mouth once a day  #60 x 3   Entered and Authorized by:   Jaci Lazier MD   Signed by:   Jaci Lazier MD on 09/21/2010   Method used:   Print then Give to Patient   RxID:   [id number] METFORMIN HCL 500 MG TABS (METFORMIN HCL) take one tablet by mouth once a day  #60 x 3   Entered and Authorized by:   Jaci Lazier MD   Signed by:   Jaci Lazier MD on 09/21/2010   Method used:   Print then Give to Patient   RxID:   [id number]    Orders Added: 1)  T- Capillary Blood Glucose [82948] 2)  T-Hgb A1C (in-house) [38756EP] 3)  Diabetic Clinic Referral [Diabetic] 4)  Mammogram (Screening) [Mammo] 5)  T-Urine Microalbumin w/creat. ratio [[id number]] 6)  Est. Patient Level IV [32951]    Laboratory Results   Blood Tests   Date/Time Received: September 21, 2010 1:41 PM  Date/Time Reported: Burke Keels  September 21, 2010 1:41 PM   HGBA1C: 6.7%   (Normal Range: Non-Diabetic - 3-6%   Control Diabetic - 6-8%) CBG Random:: 49mg /dL  Comments: Results of CBG given to [facility]. NT at 1:29pm by Charlyne Quale also given to Dr. Narda Bonds at 1:35pm by Mathis Fare  September 21, 2010 1:43 PM     Prevention & Chronic Care Immunizations   Influenza vaccine: Not documented   Influenza vaccine deferral: Deferred  (04/30/2010)    Tetanus booster: Not documented   Td booster deferral: Deferred  (04/30/2010)    Pneumococcal vaccine: Not documented   Pneumococcal vaccine deferral: Deferred  (04/30/2010)    H. zoster vaccine: Not documented   H. zoster vaccine deferral: Deferred  (04/30/2010)  Colorectal Screening   Hemoccult: Not documented   Hemoccult action/deferral: Deferred  (04/30/2010)    Colonoscopy: Not documented   Colonoscopy action/deferral: Refused  (09/21/2010)  Other Screening   Pap smear: Not documented    Pap smear action/deferral: Refused  (04/30/2010)    Mammogram: Not documented   Mammogram action/deferral: Ordered  (09/21/2010)    DXA bone density scan: Not documented   DXA bone density action/deferral: Deferred  (04/30/2010)   Smoking status: quit  (09/21/2010)  Diabetes Mellitus   HgbA1C: 6.7  (09/21/2010)    Eye exam: No diabetic retinopathy OU.   Glaucoma Suspect.   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   (01/07/2009)   Diabetic eye exam action/deferral: Ophthalmology referral  (04/30/2010)   Eye exam due: 02/2010    Foot exam: yes  (09/21/2010)   Foot exam  action/deferral: Do today   High risk foot: Not documented   Foot care education: Not documented    Urine microalbumin/creatinine ratio: 8.4  (09/27/2006)   Urine microalbumin action/deferral: Ordered    Diabetes flowsheet reviewed?: Yes   Progress toward A1C goal: Deteriorated  Lipids   Total Cholesterol: 182  (04/05/2010)   LDL: 73  (04/05/2010)   LDL Direct: Not documented   HDL: 98  (04/05/2010)   Triglycerides: 54  (04/05/2010)    SGOT (AST): 12  (01/12/2009)   SGPT (ALT): 11  (01/12/2009)   Alkaline phosphatase: 39  (01/12/2009)   Total bilirubin: 0.5  (01/12/2009)    Lipid flowsheet reviewed?: Yes   Progress toward LDL goal: Improved  Hypertension   Last Blood Pressure: 145 / 87  (09/21/2010)   Serum creatinine: 0.50  (04/30/2010)   Serum potassium 4.2  (04/30/2010)    Hypertension flowsheet reviewed?: Yes   Progress toward BP goal: Improved  Self-Management Support :    Patient will work on the following items until the next clinic visit to reach self-care goals:     Medications and monitoring: take my medicines every day, bring all of my medications to every visit, examine my feet every day  (09/21/2010)     Eating: eat more vegetables, use fresh or frozen vegetables, eat  foods that are low in salt, eat baked foods instead of fried foods, eat fruit for snacks and desserts  (09/21/2010)     Activity: take a 30 minute walk every day  (09/21/2010)    Diabetes self-management support: Education handout  (09/21/2010)   Diabetes education handout printed   Referred for diabetes self-mgmt training.    Hypertension self-management support: Education handout  (09/21/2010)   Hypertension education handout printed    Lipid self-management support: Education handout  (09/21/2010)     Lipid education handout printed   Nursing Instructions: Diabetic foot exam today Schedule screening mammogram (see order)    Process Orders Check Orders Results:     [facility]: Check successful Tests Sent for requisitioning (September 22, 2010 1:01 PM):     09/21/2010: [facility] -- T-Urine Microalbumin w/creat. ratio [[id number]] (signed)

## 2010-12-02 NOTE — Progress Notes (Signed)
Summary: refill/gg  Phone Note Refill Request  on May 09, 2007 9:55 AM  Refills Requested: Medication #1:  ATENOLOL 100 MG TABS Take 2 tablets by mouth once a day   Last Refilled: 04/10/2007  Medication #2:  HYDROCHLOROTHIAZIDE 25 MG TABS Take 1 tablet by mouth once a day   Last Refilled: 04/10/2007 REfill has atenolol 100 mg daily,  pt has been taking 100 mg daily.  she missed taking her meds before last appointment and her bp was elevated.  she does not want to increase.  Initial call taken by: Merrie Roof RN,  May 09, 2007 10:02 AM  Follow-up for Phone Call        Maximum dose for Atenolol is 100 mg daily for HTN. Will refill 100 mg daily.  Follow-up by: Eliseo Gum MD,  May 09, 2007 11:47 AM  Additional Follow-up for Phone Call Additional follow up Details #1::        Rx faxed to pharmacy Additional Follow-up by: Merrie Roof RN,  May 09, 2007 12:19 PM    New/Updated Medications: ATENOLOL 100 MG TABS (ATENOLOL) Take 1 tablet by mouth once a day  Prescriptions: HYDROCHLOROTHIAZIDE 25 MG TABS (HYDROCHLOROTHIAZIDE) Take 1 tablet by mouth once a day  #30 x 3   Entered and Authorized by:   Eliseo Gum MD   Signed by:   Eliseo Gum MD on 05/09/2007   Method used:   Telephoned to ...       [facility] [address]       [address]       [place], [place]  [address]       Ph:        Fax:    RxID:   [id number] ATENOLOL 100 MG TABS (ATENOLOL) Take 1 tablet by mouth once a day  #30 x 3   Entered and Authorized by:   Eliseo Gum MD   Signed by:   Eliseo Gum MD on 05/09/2007   Method used:   Telephoned to ...       [facility] [address]       [address]       [place], [place]  [address]       Ph:        Fax:    RxID:   [id number]

## 2010-12-02 NOTE — Progress Notes (Signed)
  Phone Note Outgoing Call   Call placed by: Theotis Barrio NT II,  December 05, 2008 4:35 PM Call placed to: Patient Action Taken: Appt scheduled Details for Reason: [facility] Summary of Call: SPOKE WITH Allison Caldwell ABOUT HER APPT WITH [facility] FOR MARCH 10, 010 @ 10:15AM. /  PATIENT IS AWARE OF THIS APPT.  ALSO REMINDER APPT LETTE MAILED OUT TO THE PATIENT.  LELA STURDIVANT NTII

## 2010-12-02 NOTE — Progress Notes (Signed)
Summary: refill/ hla  Phone Note Refill Request Message from:  Fax from Pharmacy on September 22, 2008 10:44 AM  Refills Requested: Medication #1:  NORVASC 10 MG  TABS Take once a day for blood pressure.   Last Refilled: 10/26  Medication #2:  HYDROCHLOROTHIAZIDE 25 MG TABS Take 1 tablet by mouth once a day   Last Refilled: 10/26 Initial call taken by: Marin Roberts RN,  September 22, 2008 10:46 AM      Prescriptions: NORVASC 10 MG  TABS (AMLODIPINE BESYLATE) Take once a day for blood pressure  #32 x 3   Entered and Authorized by:   Elby Showers MD   Signed by:   Elby Showers MD on 09/24/2008   Method used:   Electronically to        [facility]* (retail)       [address]       [place], [place]  [address]       Ph: [phone]       Fax: [phone]   RxID:   [id number] HYDROCHLOROTHIAZIDE 25 MG TABS (HYDROCHLOROTHIAZIDE) Take 1 tablet by mouth once a day  #30 x 3   Entered and Authorized by:   Elby Showers MD   Signed by:   Elby Showers MD on 09/24/2008   Method used:   Electronically to        [facility]* (retail)       [address]       [place], [place]  [address]       Ph: [phone]       Fax: [phone]   RxID:   [id number]

## 2010-12-02 NOTE — Assessment & Plan Note (Signed)
Summary: FU VISIT/VS   Vital Signs:  Patient Profile:   65 Years Old Female Height:     62.5 inches (158.75 cm) Weight:      240.3 pounds (109.23 kg) BMI:     43.41 Temp:     98.1 degrees F (36.72 degrees C) oral Pulse rate:   62 / minute BP sitting:   181 / 109  (right arm)  Pt. in pain?   no  Vitals Entered By: Henderson Cloud (February 12, 2007 1:33 PM)              Is Patient Diabetic? Yes  Nutritional Status Obese CBG Result 101  Have you ever been in a relationship where you felt threatened, hurt or afraid?No   Does patient need assistance? Functional Status Self care Ambulation Normal   PCP:  [place]  Chief Complaint:  follow-up visit and c/o weight gain.  History of Present Illness: Pt is a 65yr old lady with h/o HTN-poorly controlled, DM, morbid obesity, TIA in past presents for a f/u on HTN and DM.  She c/o wt gain around 20 pounds in last 4 mth.  Diabetes Management History:      She has not been enrolled in the "Diabetic Education Program".  She states understanding of dietary principles and is following her diet appropriately.  Sensory loss is noted.  She is not checking home blood sugars.  She says that she is exercising.        Hypoglycemic symptoms are not occurring.  No hyperglycemic symptoms are reported.        There are no symptoms to suggest diabetic complications.  No changes have been made to her treatment plan since last visit.    Hypertension History:      She complains of side effects from treatment, but denies headache, chest pain, palpitations, dyspnea with exertion, orthopnea, peripheral edema, visual symptoms, neurologic problems, and syncope.  She notes the following problems with antihypertensive medication side effects: Wt gain.        Positive major cardiovascular risk factors include female age 67 years old or older, diabetes, hypertension, and current tobacco user.      Prior Medications (reviewed today): ATENOLOL 100 MG TABS  (ATENOLOL) Take 2 tablets by mouth once a day CATAPRES 0.1 MG TABS (CLONIDINE HCL) Take 1 tablet by mouth three times a day HYDROCHLOROTHIAZIDE 25 MG TABS (HYDROCHLOROTHIAZIDE) Take 1 tablet by mouth once a day METFORMIN HCL 500 MG TABS (METFORMIN HCL) Take 1 tablet by mouth twice a day ASPIR-LOW 81 MG TBEC (ASPIRIN) Take 1 tablet by mouth once a day LISINOPRIL 40 MG TABS (LISINOPRIL) Take 1 tablet by mouth once a day MAG-OX 400 400 MG TABS (MAGNESIUM OXIDE) Take 1 tablet by mouth two times a day Current Allergies (reviewed today): No known allergies     Risk Factors:  Tobacco use:  current    Cigarettes:  Yes Exercise:  yes    Physical Exam  General:     alert and overweight-appearing.   Head:     Normocephalic and atraumatic without obvious abnormalities. No apparent alopecia or balding. Eyes:     No corneal or conjunctival inflammation noted. EOMI. Perrla. Funduscopic exam benign, without hemorrhages, exudates or papilledema. Vision grossly normal. Ears:     External ear exam shows no significant lesions or deformities.  Otoscopic examination reveals clear canals, tympanic membranes are intact bilaterally without bulging, retraction, inflammation or discharge. Hearing is grossly normal bilaterally. Mouth:  Oral mucosa and oropharynx without lesions or exudates.  Teeth in good repair. Neck:     obese Lungs:     Normal respiratory effort, chest expands symmetrically. Lungs are clear to auscultation, no crackles or wheezes. Heart:     Normal rate and regular rhythm. S1 and S2 normal without gallop, murmur, click, rub or other extra sounds. Abdomen:     Bowel sounds positive,abdomen soft and non-tender without masses, organomegaly or hernias noted. Neurologic:     Non focal    Impression & Recommendations:  Problem # 1:  Hx of ESSENTIAL HYPERTENSION (ICD-401.9) Pt has a very hard time controlling her BP. She says, her brother who is in 5s, also has the same  problem. She also informs me that when she was younger she had probs with Polymenorrhea and menorrhagia. She went through menopause at 65yrs. On previous labs, what was interesting was that her Aldosterone Renin Ratio was 50, but with low total Aldosterone level of 11ng/dl. What I planned to do at this point is check 24hr urine for Aldosterone and Cortisol level, also check testosterone level  today.  If above is negative, two things left to be done would be Adrenal imaging and R/O RAS (unilateral, pt has been on ACE without probs). Then the final thing would be to consult Endocrinologist. So will start as planned. Meanwhile, though I would like her to be on Spironolactone, it will interfere with the test result and so will do it next visit. For now double Lisinopril Her updated medication list for this problem includes:    Atenolol 100 Mg Tabs (Atenolol) .Marland Kitchen... Take 2 tablets by mouth once a day    Catapres 0.1 Mg Tabs (Clonidine hcl) .Marland Kitchen... Take 1 tablet by mouth three times a day    Hydrochlorothiazide 25 Mg Tabs (Hydrochlorothiazide) .Marland Kitchen... Take 1 tablet by mouth once a day    Lisinopril 40 Mg Tabs (Lisinopril) .Marland Kitchen... Take 1 tablet by mouth once a day  Orders: T- * Misc. Laboratory test [phone]) T-Testosterone; Total [phone])  Total testosterone level 61.87 which is normal for Tanner stage V.   Problem # 2:  DM (ICD-250.00) Doing well with increased dose of Metformin. Her updated medication list for this problem includes:    Metformin Hcl 500 Mg Tabs (Metformin hcl) .Marland Kitchen... Take 1 tablet by mouth twice a day    Aspir-low 81 Mg Tbec (Aspirin) .Marland Kitchen... Take 1 tablet by mouth once a day    Lisinopril 40 Mg Tabs (Lisinopril) .Marland Kitchen... Take 1 tablet by mouth once a day   Problem # 3:  Hx of MORBID OBESITY (ICD-278.01) Pt has gained about 15 pounds in 4 mths app, not sure what the cause is. TSH has been normal in the past. I think hormonal inbalance is the cause of all her probs. Very high chance of  Metabolic syndrome.  Medications Added to Medication List This Visit: 1)  Lisinopril 40 Mg Tabs (Lisinopril) .... Take 1 tablet by mouth once a day  Other Orders: Capillary Blood Glucose (24401) Fingerstick (02725)  Diabetes Management Assessment/Plan:      The following lipid goals have been established for the patient: Total cholesterol goal of 200; LDL cholesterol goal of 100; HDL cholesterol goal of 40; Triglyceride goal of 200.  Her blood pressure goal is < 130/80.    Hypertension Assessment/Plan:      The patient's hypertensive risk group is category C: Target organ damage and/or diabetes.  Her calculated 10 year risk of coronary heart  disease is 20 %.  Today's blood pressure is 181/109.  Her blood pressure goal is < 130/80.   Patient Instructions: 1)  From tomorrow morning, collect all urine you make in the container given and store it as instructed. Bring it to the lab, as soon as you can at the end of 24hrs. 2)  It is important that you exercise regularly at least 20 minutes 5 times a week.  3)  You need to lose weight. Consider a lower calorie diet and regular exercise.  4)  Please return for a FASTING Lipid Profile and HgBA1c in 3 months. 5)  Lisinopril has been increased to double the dose. 6)  Please schedule a follow-up appointment in 2 weeks.

## 2010-12-02 NOTE — Progress Notes (Signed)
Summary: refill/ hla  Phone Note Refill Request Message from:  Fax from Pharmacy on October 19, 2009 4:11 PM  Refills Requested: Medication #1:  GLIPIZIDE-METFORMIN HCL 5-500 MG TABS Take 2 tablets twice a day.   Last Refilled: 11/11  Medication #2:  NORVASC 10 MG  TABS Take once a day for blood pressure   Last Refilled: 11/16 Initial call taken by: Marin Roberts RN,  October 19, 2009 4:11 PM  Follow-up for Phone Call        were refilled yesterday.

## 2010-12-02 NOTE — Progress Notes (Signed)
Summary: med refill/gp  Phone Note Refill Request Message from:  Fax from Pharmacy on August 04, 2010 11:43 AM  Refills Requested: Medication #1:  GLIPIZIDE-METFORMIN HCL 5-500 MG TABS Take one tablet two times a day.   Last Refilled: 07/02/2010 Last appt. July 1 w/BMP.   Method Requested: Electronic Initial call taken by: Chinita Pester RN,  August 04, 2010 11:43 AM  Follow-up for Phone Call        No appt in EMR> Sent flag to [facility] to sch in Nov or Dec Follow-up by: Blanch Media MD,  August 04, 2010 12:19 PM    Prescriptions: GLIPIZIDE-METFORMIN HCL 5-500 MG TABS (GLIPIZIDE-METFORMIN HCL) Take one tablet two times a day.  #64 x 2   Entered and Authorized by:   Blanch Media MD   Signed by:   Blanch Media MD on 08/04/2010   Method used:   Electronically to        [facility] [phone]* (retail)       [address]       [place], [place]  [address]       Ph: [phone]       Fax: [phone]   RxID:   [id number]

## 2010-12-02 NOTE — Progress Notes (Signed)
Summary: Refill/gh  Phone Note Refill Request Message from:  Pharmacy on December 29, 2008 11:54 AM  Refills Requested: Medication #1:  LISINOPRIL 40 MG TABS Take 1 tablet by mouth once a day   Last Refilled: 11/24/2008  Method Requested: Electronic Initial call taken by: Angelina Ok RN,  December 29, 2008 11:55 AM      Prescriptions: LISINOPRIL 40 MG TABS (LISINOPRIL) Take 1 tablet by mouth once a day  #30 x 5   Entered and Authorized by:   Elby Showers MD   Signed by:   Elby Showers MD on 12/31/2008   Method used:   Electronically to        [facility] [phone]* (retail)       [address]       [place], [place]  [address]       Ph: [phone]       Fax: [phone]   RxID:   [id number] HYDROCHLOROTHIAZIDE 25 MG TABS (HYDROCHLOROTHIAZIDE) Take 1 tablet by mouth once a day  #30 x 3   Entered and Authorized by:   Elby Showers MD   Signed by:   Elby Showers MD on 12/31/2008   Method used:   Electronically to        [facility] [phone]* (retail)       [address]       [place], [place]  [address]       Ph: [phone]       Fax: [phone]   RxID:   [id number]

## 2010-12-02 NOTE — Discharge Summary (Signed)
Summary: Hospital Discharge Update     Hospital Discharge Update:  Date of Admission: 04/04/2010 Date of Discharge: 04/08/2010  Brief Summary:  1. symptomatic tachycardia/AVNRT 2. HTN 3. DM, type 2 4. Anxiety disorder 5. Depression  Lab or other results pending at discharge:  Myoview stress test  Labs needed at follow-up: Basic metabolic panel  Other follow-up issues:  Please, follow up on tachycardia/EP recs on possible ablation.  Problem list changes:  Added new problem of ANXIETY DEPRESSION (ICD-300.4) - Signed  Medication list changes:  Removed medication of HYDROCHLOROTHIAZIDE 25 MG TABS (HYDROCHLOROTHIAZIDE) Take 1 tablet by mouth once a day - Signed Removed medication of LISINOPRIL 20 MG TABS (LISINOPRIL) Take one tablet daily for blood pressure. - Signed Added new medication of COREG 12.5 MG TABS (CARVEDILOL) Take 1 tablet by mouth two times a day - Signed Added new medication of BENICAR 20 MG TABS (OLMESARTAN MEDOXOMIL) Take 1 tablet by mouth once a day - Signed Removed medication of NORVASC 10 MG  TABS (AMLODIPINE BESYLATE) Take once a day for blood pressure - Signed Removed medication of CATAPRES 0.1 MG TABS (CLONIDINE HCL) Take 1 tablet by mouth  twice a day - Signed Added new medication of ZOCOR 20 MG TABS (SIMVASTATIN) Take 1 tab by mouth at bedtime - Signed Added new medication of ASPIRIN 325 MG TABS (ASPIRIN) Take 1 tablet by mouth once a day - Signed Added new medication of SERTRALINE HCL 50 MG TABS (SERTRALINE HCL) Take 1 tablet by mouth once a day - Signed Removed medication of ASPIR-LOW 81 MG TBEC (ASPIRIN) Take 1 tablet by mouth once a day - Signed Added new medication of CARDIZEM LA 240 MG XR24H-TAB (DILTIAZEM HCL COATED BEADS) Take 1 tablet by mouth once a day - Signed Added new medication of ALPRAZOLAM 0.25 MG TABS (ALPRAZOLAM) Take 1 tablet by mouth two times a day as needed - Signed Changed medication from CARDIZEM LA 240 MG XR24H-TAB (DILTIAZEM HCL  COATED BEADS) Take 1 tablet by mouth once a day to CARDIZEM CD 360 MG XR24H-CAP (DILTIAZEM HCL COATED BEADS) Take 1 tablet by mouth once a day - Signed Rx of COREG 12.5 MG TABS (CARVEDILOL) Take 1 tablet by mouth two times a day;  #60 x 11;  Signed;  Entered by: Deatra Robinson MD;  Authorized by: Deatra Robinson MD;  Method used: Electronically to [facility] [phone]*, [address], [place], [place], [place]  [address], Ph: [phone], Fax: [phone] Rx of BENICAR 20 MG TABS (OLMESARTAN MEDOXOMIL) Take 1 tablet by mouth once a day;  #30 x 11;  Signed;  Entered by: Deatra Robinson MD;  Authorized by: Deatra Robinson MD;  Method used: Electronically to [facility] [phone]*, [address], [place], [place], [place]  [address], Ph: [phone], Fax: [phone] Rx of ZOCOR 20 MG TABS (SIMVASTATIN) Take 1 tab by mouth at bedtime;  #30 x 11;  Signed;  Entered by: Deatra Robinson MD;  Authorized by: Deatra Robinson MD;  Method used: Electronically to [facility] [phone]*, [address] [place], [place], [place]  [address], Ph: [phone], Fax: [phone] Rx of ASPIRIN 325 MG TABS (ASPIRIN) Take 1 tablet by mouth once a day;  #30 x 11;  Signed;  Entered by: Deatra Robinson MD;  Authorized by: Deatra Robinson MD;  Method used: Electronically to [facility] [phone]*, [address], [place], [place], [place]  [address], Ph: [phone], Fax: [phone]  Rx of SERTRALINE HCL 50 MG TABS (SERTRALINE HCL) Take 1 tablet by mouth once a day;  #30 x 11;  Signed;  Entered by: Deatra Robinson MD;  Authorized by: Deatra Robinson MD;  Method used: [facility]ically to [facility] [phone]*, [address], [place], [place], [place]  [address], Ph: [phone], Fax: [phone] Rx of COREG 12.5 MG TABS (CARVEDILOL) Take 1 tablet by mouth two times a day;  #60 x 11;  Signed;  Entered by: Deatra Robinson MD;  Authorized by: Deatra Robinson MD;  Method used: Print then  Give to Patient Rx of BENICAR 20 MG TABS (OLMESARTAN MEDOXOMIL) Take 1 tablet by mouth once a day;  #30 x 11;  Signed;  Entered by: Deatra Robinson MD;  Authorized by: Deatra Robinson MD;  Method used: Print then Give to Patient Rx of ZOCOR 20 MG TABS (SIMVASTATIN) Take 1 tab by mouth at bedtime;  #30 x 11;  Signed;  Entered by: Deatra Robinson MD;  Authorized by: Deatra Robinson MD;  Method used: Print then Give to Patient Rx of ASPIRIN 325 MG TABS (ASPIRIN) Take 1 tablet by mouth once a day;  #30 x 11;  Signed;  Entered by: Deatra Robinson MD;  Authorized by: Deatra Robinson MD;  Method used: Print then Give to Patient Rx of SERTRALINE HCL 50 MG TABS (SERTRALINE HCL) Take 1 tablet by mouth once a day;  #30 x 11;  Signed;  Entered by: Deatra Robinson MD;  Authorized by: Deatra Robinson MD;  Method used: Print then Give to Patient Rx of CARDIZEM LA 240 MG XR24H-TAB (DILTIAZEM HCL COATED BEADS) Take 1 tablet by mouth once a day;  #30 x 11;  Signed;  Entered by: Deatra Robinson MD;  Authorized by: Deatra Robinson MD;  Method used: Print then Give to Patient Rx of ALPRAZOLAM 0.25 MG TABS (ALPRAZOLAM) Take 1 tablet by mouth two times a day as needed;  #60 x 5;  Signed;  Entered by: Deatra Robinson MD;  Authorized by: Deatra Robinson MD;  Method used: Print then Give to Patient Rx of CARDIZEM CD 360 MG XR24H-CAP (DILTIAZEM HCL COATED BEADS) Take 1 tablet by mouth once a day;  #30 x 11;  Signed;  Entered by: Deatra Robinson MD;  Authorized by: Deatra Robinson MD;  Method used: Print then Give to Patient Rx of ALPRAZOLAM 0.25 MG TABS (ALPRAZOLAM) Take 1 tablet by mouth two times a day as needed;  #60 x 5;  Signed;  Entered by: Deatra Robinson MD;  Authorized by: Deatra Robinson MD;  Method used: Print then Give to Patient  Allergy list changes:  Added new allergy or adverse reaction of ACE INHIBITORS - Signed  The medication, problem, and allergy lists have been updated.  Please see the dictated discharge  summary for details.  Discharge medications:  GLIPIZIDE-METFORMIN HCL 5-500 MG TABS (GLIPIZIDE-METFORMIN HCL) Take one tablet two times a day. COREG 12.5 MG TABS (CARVEDILOL) Take 1 tablet by mouth two times a day BENICAR 20 MG TABS (OLMESARTAN MEDOXOMIL) Take 1 tablet by mouth once a day ZOCOR 20 MG TABS (SIMVASTATIN) Take 1 tab by mouth at bedtime ASPIRIN 325 MG TABS (ASPIRIN) Take 1 tablet by mouth once a day SERTRALINE HCL 50 MG TABS (SERTRALINE HCL) Take 1 tablet by mouth once a day CARDIZEM CD 360 MG XR24H-CAP (DILTIAZEM HCL COATED BEADS) Take 1 tablet by mouth once a day ALPRAZOLAM 0.25 MG TABS (ALPRAZOLAM) Take 1 tablet by mouth two times a day  as needed  Other patient instructions:  Stop taking clonidine, HCTZ, lisinopril and Norvasc. New medications: coreg, benicar,cardizem, simvastatin, ASA, Zoloft, xanax Please, follow up at [facility] outpatient clinic on July 1st at 10:30 am;Phone #[phone]. Please, follow up with Dr. Johney Frame (EP) on July 13th at 11:15 am; phone#: [phone]  Note: Hospital Discharge Medications & Other Instructions handout was printed, one copy for patient and a second copy to be placed in hospital chart.  Prescriptions: ALPRAZOLAM 0.25 MG TABS (ALPRAZOLAM) Take 1 tablet by mouth two times a day as needed  #60 x 5   Entered and Authorized by:   Deatra Robinson MD   Signed by:   Deatra Robinson MD on 04/08/2010   Method used:   Print then Give to Patient   RxID:   [id number] CARDIZEM CD 360 MG XR24H-CAP (DILTIAZEM HCL COATED BEADS) Take 1 tablet by mouth once a day  #30 x 11   Entered and Authorized by:   Deatra Robinson MD   Signed by:   Deatra Robinson MD on 04/08/2010   Method used:   Print then Give to Patient   RxID:   [id number] ALPRAZOLAM 0.25 MG TABS (ALPRAZOLAM) Take 1 tablet by mouth two times a day as needed  #60 x 5   Entered and Authorized by:   Deatra Robinson MD   Signed by:   Deatra Robinson MD on 04/08/2010   Method used:    Print then Give to Patient   RxID:   [id number] CARDIZEM LA 240 MG XR24H-TAB (DILTIAZEM HCL COATED BEADS) Take 1 tablet by mouth once a day  #30 x 11   Entered and Authorized by:   Deatra Robinson MD   Signed by:   Deatra Robinson MD on 04/08/2010   Method used:   Print then Give to Patient   RxID:   [id number] SERTRALINE HCL 50 MG TABS (SERTRALINE HCL) Take 1 tablet by mouth once a day  #30 x 11   Entered and Authorized by:   Deatra Robinson MD   Signed by:   Deatra Robinson MD on 04/06/2010   Method used:   Print then Give to Patient   RxID:   [id number] ASPIRIN 325 MG TABS (ASPIRIN) Take 1 tablet by mouth once a day  #30 x 11   Entered and Authorized by:   Deatra Robinson MD   Signed by:   Deatra Robinson MD on 04/06/2010   Method used:   Print then Give to Patient   RxID:   [id number] ZOCOR 20 MG TABS (SIMVASTATIN) Take 1 tab by mouth at bedtime  #30 x 11   Entered and Authorized by:   Deatra Robinson MD   Signed by:   Deatra Robinson MD on 04/06/2010   Method used:   Print then Give to Patient   RxID:   [id number] BENICAR 20 MG TABS (OLMESARTAN MEDOXOMIL) Take 1 tablet by mouth once a day  #30 x 11   Entered and Authorized by:   Deatra Robinson MD   Signed by:   Deatra Robinson MD on 04/06/2010   Method used:   Print then Give to Patient   RxID:   [id number] COREG 12.5 MG TABS (CARVEDILOL) Take 1 tablet by mouth two times a day  #60 x 11   Entered and Authorized by:   Deatra Robinson MD   Signed by:   Deatra Robinson MD on 04/06/2010   Method used:   Print then Give to Patient   RxID:  [id number] SERTRALINE HCL 50 MG TABS (SERTRALINE HCL) Take 1 tablet by mouth once a day  #30 x 11   Entered and Authorized by:   Deatra Robinson MD   Signed by:   Deatra Robinson MD on 04/06/2010   Method used:   [facility]ron[facility] to        [facility] [phone][facility] (retail)       [address]       [place], [place]  [address]        Ph: [phone]       Fax: [phone]   RxID:   [id number] ASPIRIN 325 MG TABS (ASPIRIN) Take 1 tablet by mouth once a day  #30 x 11   Entered and Authorized by:   Deatra Robinson MD   Signed by:   Deatra Robinson MD on 04/06/2010   Method used:   [facility]        [facility]  [address] [phone]* (retail)       [address]       [place], [place]  [address]       Ph: [phone]       Fax: [phone]   RxID:   [id number] ZOCOR 20 MG TABS (SIMVASTATIN) Take 1 tab by mouth at bedtime  #30 x 11   Entered and Authorized by:   Deatra Robinson MD   Signed by:   Deatra Robinson MD on 04/06/2010   Method used:   [facility]        [facility]  [address] [phone][facility] (retail)       [address]       [place], [place]  [address]       Ph: [phone]       Fax: [phone]   RxID:   [id number] BENICAR 20 MG TABS (OLMESARTAN MEDOXOMIL) Take 1 tablet by mouth once a day  #30 x 11   Entered and Authorized by:   Deatra Robinson MD   Signed by:   Deatra Robinson MD on 04/06/2010   Method used:   [facility]        [facility]  [address] [phone]* (retail)       [address]       [place], [place]  [address]       Ph: [phone]       Fax: [phone]   RxID:   [id number] COREG 12.5 MG TABS (CARVEDILOL) Take 1 tablet by mouth two times a day  #60 x 11   Entered and Authorized by:   Deatra Robinson MD   Signed by:   Deatra Robinson MD on 04/06/2010   Method used:   [facility]        [facility]  [address] [phone]* (retail)       [address]       [place], [place]  [address]       Ph: [phone]       Fax: [phone]   RxID:   [id number]

## 2010-12-02 NOTE — Progress Notes (Signed)
Summary: med refill/gp  Phone Note Refill Request Message from:  Fax from Pharmacy on October 27, 2010 3:50 PM  Refills Requested: Medication #1:  ALPRAZOLAM 0.25 MG TABS Take 1 tablet by mouth two times a day as needed   Last Refilled: 09/01/2010 Last appt. 09/21/10; next appt. Feb 2012.   Method Requested: Telephone to Pharmacy Initial call taken by: Chinita Pester RN,  October 27, 2010 3:50 PM  Follow-up for Phone Call        Refill approved-nurse to complete Follow-up by: Julaine Fusi  DO,  October 27, 2010 4:00 PM  Additional Follow-up for Phone Call Additional follow up Details #1::        Rx called to pharmacy - [facility]. Additional Follow-up by: Chinita Pester RN,  October 27, 2010 4:39 PM    Prescriptions: ALPRAZOLAM 0.25 MG TABS (ALPRAZOLAM) Take 1 tablet by mouth two times a day as needed  #60 x 0   Entered and Authorized by:   Julaine Fusi  DO   Signed by:   Julaine Fusi  DO on 10/27/2010   Method used:   Telephoned to ...       [facility] [address] [phone]* (retail)       [address]       [place], [place]  [address]       Ph: [phone]       Fax: [phone]   RxID:   [id number]

## 2010-12-02 NOTE — Progress Notes (Signed)
Summary: Refill/gh  Phone Note Refill Request Message from:  Fax from Pharmacy on Mar 30, 2010 3:07 PM  Refills Requested: Medication #1:  GLIPIZIDE-METFORMIN HCL 5-500 MG TABS Take one tablet two times a day.   Last Refilled: 02/22/2010  Method Requested: Electronic Initial call taken by: Angelina Ok RN,  Mar 30, 2010 3:08 PM    Prescriptions: GLIPIZIDE-METFORMIN HCL 5-500 MG TABS (GLIPIZIDE-METFORMIN HCL) Take one tablet two times a day.  #64 x 3   Entered and Authorized by:   Elby Showers MD   Signed by:   Elby Showers MD on 04/01/2010   Method used:   Electronically to        [facility] [phone]* (retail)       [address]       [place], [place]  [address]       Ph: [phone]       Fax: [phone]   RxID:   [id number]

## 2010-12-02 NOTE — Assessment & Plan Note (Signed)
Summary: REASSIGNED NEW TO DR/CFB   Vital Signs:  Patient Profile:   65 Years Old Female Height:     62.5 inches (158.75 cm) Weight:      245.7 pounds (111.68 kg) BMI:     44.38 Temp:     98.5 degrees F (36.94 degrees C) oral Pulse rate:   62 / minute BP sitting:   181 / 101  (left arm)  Pt. in pain?   no  Vitals Entered By: Stanton Kidney Ditzler RN (May 14, 2007 10:54 AM)              Is Patient Diabetic? Yes  Nutritional Status BMI of > 30 = obese Nutritional Status Detail ok CBG Result 210  Have you ever been in a relationship where you felt threatened, hurt or afraid?denies   Does patient need assistance? Functional Status Self care Ambulation Normal   PCP:  [place] [place]  Chief Complaint:  FU on white productive cough- Tussinex too expensive.Allison Caldwell  History of Present Illness: This is a 65 year old female with a history of DM II, poorly controlled HTN and obesity. She comes in today to continue a work up for possible seconday HTN. She was originally diagnosed with HTN as a teenager and has struggled to keep her BP close to goal her whole life. For the last three visits she has been 180's/100's generally but always denies chest pain, palpitations, headaches, or neurologic deficit. Today her only concern is related to a need for refills and a productive cough for the two weeks. She currently denies fv, chills, shortness of breath or chest pain. She was prescribed tussinex but was unable to purchase such an expensive medication.    Prior Medications :  ATENOLOL 100 MG TABS (ATENOLOL) Take 1 tablet by mouth once a day CATAPRES 0.1 MG TABS (CLONIDINE HCL) Take 1 tablet by mouth three times a day HYDROCHLOROTHIAZIDE 25 MG TABS (HYDROCHLOROTHIAZIDE) Take 1 tablet by mouth once a day METFORMIN HCL 500 MG TABS (METFORMIN HCL) Take 1 tablet by mouth twice a day ASPIR-LOW 81 MG TBEC (ASPIRIN) Take 1 tablet by mouth once a day LISINOPRIL 40 MG TABS (LISINOPRIL) Take 1 tablet by mouth once a  day MAG-OX 400 400 MG TABS (MAGNESIUM OXIDE) Take 1 tablet by mouth two times a day    Current Allergies (reviewed today): No known allergies    Family History:    Reviewed history from 12/27/2006 and no changes required:       Mother: CAD, passed away at the age of 46yrs       Father:Lung Ca       Sister: Complications of DM       Brother:Renal failure, CVA, deceased  Social History:    Occupation:House keeping    Single    Alcohol: 1/10 of liquor wkend    Tobacco: 1/2 pack a week    Drugs: THC occ   Risk Factors:  Tobacco use:  current    Cigarettes:  Yes Exercise:  yes    Physical Exam  General:     alert, well-nourished, and well-hydrated.   Head:     normocephalic.   Eyes:     vision grossly intact, pupils equal, pupils round, and pupils reactive to light.  Fundi are difficult to visualize.  Neck:     supple.   Lungs:     normal respiratory effort, normal breath sounds, no dullness, no crackles, and no wheezes.   Heart:     normal  rate, regular rhythm, no murmur, no gallop, and no rub.   Abdomen:     soft, non-tender, normal bowel sounds, and no masses.   Pulses:     R radial normal and L radial normal.   Neurologic:     alert & oriented X3, cranial nerves II-XII intact, strength normal in all extremities, sensation intact to light touch, gait normal, DTRs symmetrical and normal, and Romberg negative.   Skin:     turgor normal, color normal, and no rashes.      Impression & Recommendations:  Problem # 1:  Hx of ESSENTIAL HYPERTENSION (ICD-401.9) We will continue the patient's work up for secondary HTN. Our differential includes hyperadosteronism or renal artery stenosis. We will check the patient for RAS using renal artery dopplers. She has an appointment scheduled for August 6th. The patient also has a history of a high Aldo/creatinine ratio of 55. A confirmatory test primary hyperadlsteronism is the saline infusion test and we will need to do this in  the future if the renal doppler studies are negative for RAS. We feel that the patient needs a work up because of her resistant HTN, a family history of difficult to treat HTN, and the young age that she acquired HTN. Her updated medication list for this problem includes:    Atenolol 100 Mg Tabs (Atenolol) .Allison Caldwell... Take 1 tablet by mouth once a day    Catapres 0.1 Mg Tabs (Clonidine hcl) .Allison Caldwell... Take 1 tablet by mouth three times a day    Hydrochlorothiazide 25 Mg Tabs (Hydrochlorothiazide) .Allison Caldwell... Take 1 tablet by mouth once a day    Lisinopril 40 Mg Tabs (Lisinopril) .Allison Caldwell... Take 1 tablet by mouth once a day  Orders: Radiology other (Radiology Other) T-TSH [phone]) Short Stay Referral (Short Stay) Vascular Other (Vascular other)   Problem # 2:  DM (ICD-250.00) I counselled the patient about the importance of checking her blood sugars at home. She has not been taking them at all. Checking blood sugars is an important way for patients to monitor for hypoglycemia and very elevated CBGs. Her HbA1c today was 7.0.  We will measure her Hba1c in three months at her next visit.  Her updated medication list for this problem includes:    Metformin Hcl 500 Mg Tabs (Metformin hcl) .Allison Caldwell... Take 1 tablet by mouth twice a day    Aspir-low 81 Mg Tbec (Aspirin) .Allison Caldwell... Take 1 tablet by mouth once a day    Lisinopril 40 Mg Tabs (Lisinopril) .Allison Caldwell... Take 1 tablet by mouth once a day  Orders: T- Capillary Blood Glucose (09811) T-Hgb A1C (in-house) ([id number])   Problem # 3:  Preventive Health Care (ICD-V70.0) We will schedule a mammagram later in the year and perform a breast examat the next visit. We will also discuss the need for colon cancer screening at that time aswell.   Problem # 4:  PRODUCTIVE COUGH (ICD-786.2) The differential for cough is GERD, URI, bronchitis, pnuemonia, or ACE inhibitor therapy. Because the patient has a normal lung exam and is afebrile pneumonia is less likely. The patient could be  coughing as a response to an ACE inhibitor, but we will not know that unless the cough continues. A URI or bronchitis is most likely. Regardless, we will manage the patients cough conservatively with oral hydration, rest and perhaps medication in the future if she continues to complain of cough in 1 month.    Patient Instructions: 1)  Please schedule a follow-up appointment in 1 month. 2)  We will call you if your renal artery studies are abnormal. We are doing this test to find out if you have a specific reversable reason for your high blood pressure.  3)  Please check your blood glucoses more often. It is important to know if you are hypoglycemic.         Vital Signs:  Patient Profile:   65 Years Old Female Height:     62.5 inches (158.75 cm) Weight:      245.7 pounds (111.68 kg) BMI:     44.38 Temp:     98.5 degrees F (36.94 degrees C) oral Pulse rate:   62 / minute BP sitting:   181 / 101             CBG Result 210      Laboratory Results   Blood Tests   Date/Time Recieved: May 14, 2007 11:21 AM  Date/Time Reported: ..................................................................Allison KitchenOren Beckmann  May 14, 2007 11:21 AM   HGBA1C: 7.0%   (Normal Range: Non-Diabetic - 3-6%   Control Diabetic - 6-8%) CBG Random: 210

## 2010-12-02 NOTE — Assessment & Plan Note (Signed)
Summary: EST-CK/FU/MEDS/CFB   Vital Signs:  Patient Profile:   65 Years Old Female Height:     62.5 inches (158.75 cm) Weight:      246.01 pounds BMI:     44.44 Temp:     98 degrees F oral Pulse rate:   63 / minute BP sitting:   122 / 89  (right arm)  Pt. in pain?   no  Vitals Entered By: Angelina Ok RN (July 09, 2007 2:18 PM)              Is Patient Diabetic? Yes  Nutritional Status BMI of > 30 = obese CBG Result 126  Have you ever been in a relationship where you felt threatened, hurt or afraid?No   Does patient need assistance? Functional Status Self care Ambulation Normal     PCP:  [facility] [place]  Chief Complaint:  Check up needs refills.  History of Present Illness: This is a 65 year old female with a history of HTN, CHF, Obesity, DM II and a recent cough. She is coming in without acute complaints. HEr cough has resolved over the last few weeks. She is feeling well and coming in for preventive medicine and refills of her medications.   DM HPI She is still not checking her blood sugars at all, and admits that she has run out of her test strips due to cost. She has been taking her metformin faithfully, but has not been exercising or eating a diabetic diet.   HTN - Her blood pressures have been elevated and resistant to multiple medications. She recently has cut back on her abuse of alcohol and says that her BP has been better because of it.   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 2D echo in 3/07 showed Hypokinesis of apical aspect of inferior septum and mid apical inferior wall.       AOCD - Ferritin 47, Iron 101, %saturation 31   Social History:    Occupation:House keeping    Single    Alcohol: Drinks 1/4 to 1/2 bottle of  wine each day on each weekend and has recently cut back     Tobacco: 1/2 pack a week    Drugs: THC occ   Risk Factors:  Tobacco use:  current    Cigarettes:  Yes -- Occ pack(s) per day Exercise:  yes   Review of Systems  The patient denies fever, hoarseness, chest pain, syncope, dyspnea on exhertion, melena, hematochezia, hematuria, and difficulty walking.     Physical Exam  General:     alert and well-developed.   Head:     normocephalic and atraumatic.   Eyes:     vision grossly intact, pupils equal, pupils round, and pupils reactive to light.   Mouth:     pharynx pink and moist, no erythema, and poor dentition.   Neck:     supple.   Breasts:     skin/areolae normal, no masses, no abnormal thickening, no nipple discharge, and no tenderness.   Lungs:     normal respiratory effort, no intercostal retractions, normal breath sounds, no dullness, no crackles,  and no wheezes.   Heart:     normal rate and no murmur.   Abdomen:     soft, non-tender, normal bowel sounds, no distention, and no hepatomegaly.   Msk:     normal ROM and no joint tenderness.   Pulses:     R radial normal and L radial normal.   Neurologic:     alert & oriented X3, cranial nerves II-XII intact, strength normal in all extremities, and sensation intact to light touch.   Skin:     turgor normal.   Cervical Nodes:     no anterior cervical adenopathy and no posterior cervical adenopathy.      Impression & Recommendations:  Problem # 1:  DM (ICD-250.00) The patient has only been taking metformin once a day. We informed her that her dose is twice and she will take that dose now. Her Hga1c less than 8 weeks ago was 7.0. We feel that her DM will be optimized with metformin. She needs a opthomology exam later this year. She also needs an creatinine/microalbumin ratio later this week, her last was done  last year and was normal. She is on ASA for cardiovascular event prevention and lisinopril to protect  renal fuction. Her last creatinine was 0.6 and we will check a BMP today.  Her updated medication list for this problem includes:    Metformin Hcl 500 Mg Tabs (Metformin hcl) .Marland Kitchen... Take 1 tablet by mouth twice a day    Aspir-low 81 Mg Tbec (Aspirin) .Marland Kitchen... Take 1 tablet by mouth once a day    Lisinopril 40 Mg Tabs (Lisinopril) .Marland Kitchen... Take 1 tablet by mouth once a day  Orders: T-Comprehensive Metabolic Panel ([id number])   Problem # 2:  Hx of ESSENTIAL HYPERTENSION (ICD-401.9) Her BP is wnl. She is on atenolol, catpres, HCTZ and lisnopril. She is doing well with those medications. No changes at this time.  Her updated medication list for this problem includes:    Atenolol 100 Mg Tabs (Atenolol) .Marland Kitchen... Take 1 tablet by mouth once a day    Catapres 0.1 Mg Tabs (Clonidine hcl) .Marland Kitchen... Take 1 tablet by mouth three times a day    Hydrochlorothiazide 25 Mg Tabs (Hydrochlorothiazide) .Marland Kitchen... Take 1 tablet by mouth once a day    Lisinopril 40 Mg Tabs (Lisinopril) .Marland Kitchen... Take 1 tablet by mouth once a day   Problem # 3:  Preventive Health Care (ICD-V70.0) We schedule the patient for a mammogram. We also got the placed the patient on a list for a colonscopy at [facility]. I performed a breast exam which was normal. She patient needs a pap smear and we scheduled one for her at [facility]. She indicated that she would rather have a GYN exam there than at this clinic.   Problem # 4:  Hx of MORBID OBESITY (ICD-278.01) We reviewed diet strategies including counting calories, not eating after 8 pm and and eating small well portioned meals with physical activity between. She agreed that she needed to change her diet as she has been gaining weight. We will follow up with the patient in 3 months to check up on her success. She may need an independent diet counselor at the next visit.   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 She has  limited insight as to how much of alcohol excess she consumes. She has recently cut back and drinks only on the weekends now, with wine  and beer. She does note that her BP likely is better becuaase she has recently cutback on her consumption. We congratulated the patient on her success and gave her counseling on the need to cut back further and the number to AA to help her with her efforts.   Complete Medication List: 1)  Atenolol 100 Mg Tabs (Atenolol) .... Take 1 tablet by mouth once a day 2)  Catapres 0.1 Mg Tabs (Clonidine hcl) .... Take 1 tablet by mouth three times a day 3)  Hydrochlorothiazide 25 Mg Tabs (Hydrochlorothiazide) .... Take 1 tablet by mouth once a day 4)  Metformin Hcl 500 Mg Tabs (Metformin hcl) .... Take 1 tablet by mouth twice a day 5)  Aspir-low 81 Mg Tbec (Aspirin) .... Take 1 tablet by mouth once a day 6)  Lisinopril 40 Mg Tabs (Lisinopril) .... Take 1 tablet by mouth once a day  Other Orders: Mammogram (Screening) (Mammo) Pap Smear ([address]) T-Hemoccult Card-Multiple (take home) ([address]) Gastroenterology Referral (GI) T-Magnesium ([id number])   Patient Instructions: 1)  Please schedule a follow-up appointment in 3 months. 2)  It is important that you exercise regularly at least 20 minutes 5 times a week. If you develop chest pain, have severe difficulty breathing, or feel very tired , stop exercising immediately and seek medical attention. 3)  You need to lose weight. Consider a lower calorie diet and regular exercise.  4)  Complete your Hemocult cards and return them soon. 5)  It is not healthy  for men to drink more than 2-3 drinks per day or for women to drink more than 1-2 drinks per day.    Prescriptions: MAG-OX 400 400 MG TABS (MAGNESIUM OXIDE) Take 1 tablet by mouth two times a day  #60 x 3   Entered and Authorized by:   Lollie Sails MD   Signed by:   Lollie Sails MD on 07/09/2007   Method used:   Print then Give to Patient   RxID:    [id number] LISINOPRIL 40 MG TABS (LISINOPRIL) Take 1 tablet by mouth once a day  #30 x 0   Entered and Authorized by:   Lollie Sails MD   Signed by:   Lollie Sails MD on 07/09/2007   Method used:   Print then Give to Patient   RxID:   [id number] ASPIR-LOW 81 MG TBEC (ASPIRIN) Take 1 tablet by mouth once a day  #0 x 0   Entered and Authorized by:   Lollie Sails MD   Signed by:   Lollie Sails MD on 07/09/2007   Method used:   Print then Give to Patient   RxID:   [id number] METFORMIN HCL 500 MG TABS (METFORMIN HCL) Take 1 tablet by mouth twice a day  #62 x 0   Entered and Authorized by:   Lollie Sails MD   Signed by:   Lollie Sails MD on 07/09/2007   Method used:   Print then Give to Patient   RxID:   [id number] HYDROCHLOROTHIAZIDE 25 MG TABS (HYDROCHLOROTHIAZIDE) Take 1 tablet by mouth once a day  #30 x 3   Entered and Authorized by:   Lollie Sails MD   Signed by:   Lollie Sails MD on 07/09/2007   Method used:   Print then Give to Patient   RxID:   [id number] CATAPRES 0.1 MG TABS (CLONIDINE HCL) Take 1 tablet by mouth three times a day  #0 x 0   Entered and Authorized by:   Lollie Sails MD  Signed by:   Lollie Sails MD on 07/09/2007   Method used:   Print then Give to Patient   RxID:   [id number] ATENOLOL 100 MG TABS (ATENOLOL) Take 1 tablet by mouth once a day  #30 x 3   Entered and Authorized by:   Lollie Sails MD   Signed by:   Lollie Sails MD on 07/09/2007   Method used:   Print then Give to Patient   RxID:   [id number]   Last LDL:                                                 62 (12/27/2006 9:28:00 PM)

## 2010-12-02 NOTE — Miscellaneous (Signed)
Summary: LIPIDS  Clinical Lists Changes  Observations: Added new observation of LDL: 103  --  11/02/2008 (11/11/2008 13:47)

## 2010-12-02 NOTE — Assessment & Plan Note (Signed)
Summary: 4 month rov/sl   Visit Type:  Follow-up Primary Provider:  Jaci Lazier MD   History of Present Illness: Allison Caldwell is a 65 yo AAF with a history of medical noncompliance, HTN, and SVT who presents today for follow-up.  She denies further SVT since last office visit.  She states that she has quit drinking alcohol.  She struggles with weight gain.  She also reports stable dypsnea with moderate activity. She denies symptoms of palpitations, chest pain,  orthopnea, PND, lower extremity edema, dizziness, presyncope, syncope, or neurologic sequela. The patient is tolerating medications without difficulties and is otherwise without complaint today.    Current Medications (verified): 1)  Carvedilol 25 Mg Tabs (Carvedilol) .... Take 1 Tablet By Mouth Two Times A Day 2)  Benicar 20 Mg Tabs (Olmesartan Medoxomil) .... Take 1 Tablet By Mouth Once A Day 3)  Zocor 20 Mg Tabs (Simvastatin) .... Take 1 Tab By Mouth At Bedtime 4)  Aspirin 325 Mg Tabs (Aspirin) .... Take 1 Tablet By Mouth Once A Day 5)  Sertraline Hcl 50 Mg Tabs (Sertraline Hcl) .... Take 1 Tablet By Mouth Once A Day 6)  Cardizem Cd 360 Mg Xr24h-Cap (Diltiazem Hcl Coated Beads) .... Take 1 Tablet By Mouth Once A Day 7)  Alprazolam 0.25 Mg Tabs (Alprazolam) .... Take 1 Tablet By Mouth Two Times A Day As Needed 8)  Hydrochlorothiazide 25 Mg Tabs (Hydrochlorothiazide) .... One By Mouth Once Daily 9)  Metformin Hcl 500 Mg Tabs (Metformin Hcl) .... Take One Tablet By Mouth Two Times A Day 10)  Glipizide 5 Mg Tabs (Glipizide) .... Take One Tablet By Mouth Once A Day  Allergies: 1)  ! Ace Inhibitors  Past History:  Past Medical History: Reviewed history from 05/12/2010 and no changes required. 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 2D echo in 3/07 showed Hypokinesis of apical aspect of inferior septum and mid apical inferior wall. AOCD - Ferritin 47, Iron 101, %saturation  31 Depression.   Degenerative joint disease.  Noncompliance. ETOH abuse  Past Surgical History: Reviewed history from 01/12/2009 and no changes required. ORIF right ankle surgical repair left wrist Caesarean section x 2 laser surgery right eye 2010  Social History: Reviewed history from 05/12/2010 and no changes required. 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  Lives in [place] Alcohol: heavy ETOH, but denies drinking since 6/11 Tobacco: 1/2 pack a week, reports quit since 6/11 Drugs: THC occasionally.  Review of Systems       All systems are reviewed and negative except as listed in the HPI.   Vital Signs:  Patient profile:   65 year old female Height:      62 inches Weight:      260 pounds BMI:     47.73 Pulse rate:   58 / minute BP sitting:   132 / 86  (left arm)  Vitals Entered By: Allison Caldwell CMA (October 22, 2010 3:15 PM)  Physical Exam  General:  alert and overweight-appearing.   Head:  normocephalic and atraumatic.   Eyes:  vision grossly intact.   Mouth:  has dentures Neck:  Neck supple, no JVD. No masses, thyromegaly or abnormal cervical nodes. Lungs:  normal respiratory effort, normal breath sounds, no crackles, and no wheezes.   Heart:  normal rate, regular rhythm, no murmur, no gallop, and no rub.   Abdomen:  obese, soft and nontender Msk:  Back normal, normal gait. Muscle strength  and tone normal. Extremities:  no edema Neurologic:  alert & oriented X3.     Nuclear Study  Procedure date:  04/07/2010  Findings:       Findings:  Inferior and inferolateral hypokinesis is present.    IMPRESSION:    1.  Hypokinetic the inferior and inferolateral left ventricular   walls.  However, with ventricular ejection fraction is 54%.   2.  Matched reduced perfusion at the cardiac apex may be due to   apical thinning or a small scar.    Read By:  Dellia Cloud,  M.D.       EKG  Procedure date:   10/22/2010  Findings:      sinus bradycardia 58 bpm, PR 192. 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 If you don't receive a letter, please call our office to schedule the follow-up appointment.

## 2010-12-02 NOTE — Progress Notes (Signed)
Summary: refill/ hla  Phone Note Refill Request Message from:  Fax from Pharmacy on November 07, 2007 12:38 PM  Refills Requested: Medication #1:  LISINOPRIL 40 MG TABS Take 1 tablet by mouth once a day.   Last Refilled: 12/07 Initial call taken by: Marin Roberts RN,  November 07, 2007 12:39 PM  Follow-up for Phone Call        Refill approved-nurse to complete Follow-up by: Ulyess Mort MD,  November 07, 2007 2:10 PM      Prescriptions: LISINOPRIL 40 MG TABS (LISINOPRIL) Take 1 tablet by mouth once a day  #30 x 2   Entered and Authorized by:   Ulyess Mort MD   Signed by:   Ulyess Mort MD on 11/07/2007   Method used:   Electronically sent to ...       [address]       [address]       [place], [place]  [address]       Ph: [phone]       Fax: [phone]   RxID:   [id number]

## 2010-12-02 NOTE — Consult Note (Signed)
[facility]   Imported By: Louretta Parma 09/29/2010 11:52:48  _____________________________________________________________________  External Attachment:    Type:   Image     Comment:   External Document  Appended Document: [facility] normal diabetic eye exam.

## 2010-12-02 NOTE — Assessment & Plan Note (Signed)
Summary: EST-CK/FU/MEDS   Vital Signs:  Patient Profile:   65 Years Old Female Weight:      237.7 pounds (108.05 kg) Temp:     97.6 degrees F (36.44 degrees C) oral Pulse rate:   62 / minute BP sitting:   161 / 105  (right arm)  Pt. in pain?   no  Vitals Entered By: Stanton Kidney Ditzler RN (December 27, 2006 11:23 AM)              Is Patient Diabetic? Yes  Nutritional Status Normal Nutritional Status Detail good  Have you ever been in a relationship where you felt threatened, hurt or afraid?denies   Does patient need assistance? Functional Status Self care Ambulation Normal   PCP:  Artist Beach  Chief Complaint:  FU on BP and cramps right hand this week.  History of Present Illness: Pt is a 65yr old lady with h/o HTN, HL, DM, Morbid obesity, Cardiomyopathy and H/o TIA in past with non-compliance presents today for a routine follow up. She denies CP, SOB, HA, palpitations, Diaphoresis, Vision changes. She admits to hearing a 'sizzling' noise in her head and it has been going on for  few yrs now.She thinks it is in her head and not ears. She denies ear fullness, dizziness with posture changes, weakness, loss of vision asso with this.  She says she takes her meds regularly. She denies using any illicit drugs recently.  Prior Medications: ATENOLOL 100 MG TABS (ATENOLOL) Take 2 tablets by mouth once a day CATAPRES 0.1 MG TABS (CLONIDINE HCL) Take 1 tablet by mouth three times a day HYDROCHLOROTHIAZIDE 25 MG TABS (HYDROCHLOROTHIAZIDE) Take 1 tablet by mouth once a day METFORMIN HCL 500 MG TABS (METFORMIN HCL) Take 1 tablet by mouth twice a day ASPIR-LOW 81 MG TBEC (ASPIRIN) Take 1 tablet by mouth once a day LISINOPRIL 20 MG TABS (LISINOPRIL) Take 1 tablet by mouth once a day MAG-OX 400 400 MG TABS (MAGNESIUM OXIDE) Take 1 tablet by mouth two times a day   Past Medical History:    HTN - poorly controlled    DM - HbA1c 6.4 in 11/07    Morbid obesity    H/O TIA    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 2D echo in 3/07 showed Hypokinesis of apical aspect of inferior septum and mid apical inferior wall.    AOCD - Ferritin 47, Iron 101, %saturation 31   Family History:    Mother: CAD, passed away at the age of 31yrs    Father:Lung Ca    Sister: Complications of DM    Brother:Renal failure, CVA, deceased  Social History:    Occupation:House keeping    Single    Alcohol: 1/10 of liquor wkend    Tobacco: smokes occ    Drugs: THC occ   Risk Factors:  Tobacco use:  current    Cigarettes:  Yes    Physical Exam  General:     alert and overweight-appearing.   Head:     normocephalic.   Eyes:     vision grossly intact Lt eye. Decreased to 20/60 Rt eye  Ears:     R ear normal and L ear normal.   Mouth:     good dentition and pharynx pink and moist.   Neck:     supple and no masses.   Lungs:     normal respiratory effort, normal breath sounds, no crackles, and no wheezes.   Heart:     normal  rate, regular rhythm, no murmur, and no gallop.   Neurologic:     Non focal    Impression & Recommendations:  Problem # 1:  Hx of ESSENTIAL HYPERTENSION (ICD-401.9) Very poorly controlled. With h/o TIA, imperative that it be tightly controlled. I will make some changes to her meds: increase Atenolol to 200mg  daily and add lisinopril 20mg  daily. I will follow with her in 10-14days. She will com to clinic on 01/01/07 for BMET. She says she was diagnosed with HTN at the age of 68yrs and so I will have to look into sec causes. TSH has been normal in the past. There is mild proteinuria. She has had trouble with Hypokalemia in the past and hence will order ARR in am to be drawn to r/o Primary Hyperaldosteronism and also check a UDS Her updated medication list for this problem includes:    Atenolol 100 Mg Tabs (Atenolol) .Marland Kitchen... Take 2 tablets by mouth once a day    Catapres 0.1 Mg Tabs (Clonidine hcl) .Marland Kitchen... Take 1 tablet by mouth three times a day     Hydrochlorothiazide 25 Mg Tabs (Hydrochlorothiazide) .Marland Kitchen... Take 1 tablet by mouth once a day    Lisinopril 20 Mg Tabs (Lisinopril) .Marland Kitchen... Take 1 tablet by mouth once a day  Orders: T- * Misc. Laboratory test [id number]) T-Comprehensive Metabolic Panel [id number]) T-Drug Screen-Urine, (single) [phone])   Problem # 2:  DM (ICD-250.00) HbA1c is still at goal, but has climbed ).6 points in 3 mths. So will increase Metformin to 500mg  two times a day daily. Microalb/Cr ratio was 8.4 Her updated medication list for this problem includes:    Metformin Hcl 500 Mg Tabs (Metformin hcl) .Marland Kitchen... Take 1 tablet by mouth twice a day    Aspir-low 81 Mg Tbec (Aspirin) .Marland Kitchen... Take 1 tablet by mouth once a day    Lisinopril 20 Mg Tabs (Lisinopril) .Marland Kitchen... Take 1 tablet by mouth once a day  Orders: T- Capillary Blood Glucose ([id number]) T-Hgb A1C (in-house) ([id number]) T-Comprehensive Metabolic Panel ([id number])   Problem # 3:  Hx of HYPOMAGNESEMIA (ICD-275.2) Since it was low last time and also she has had trouble with Hypokalemia, will rpt today. Orders: T-Magnesium ([id number])  Mg 1.6 will start her on MgO.   Problem # 4:  Hx of MORBID OBESITY (ICD-278.01) With her history of TIA, I had checked a Lipid panel, the resulstsof which are as following: Total Chol 211, TG-226, HDL-104!!!!! and LDL 62. Her calculated 60yr CVD risk is 3%. By adding statin and decreasing the total chol to around 150, will decrease her risk to 2%, with this calculation, I don't think it is warranted to add stating to her regimen. But it is a good idea to keep checking her chol yearly and intervene if required. g Orders: T-Comprehensive Metabolic Panel [id number] [phone]) T-Lipid Profile ([id number])   Medications Added to Medication List This Visit: 1)  Atenolol 100 Mg Tabs (Atenolol) .... Take 2 tablets by mouth once a day 2)  Catapres 0.1 Mg Tabs (Clonidine hcl) .... Take 1 tablet by mouth three times a day 3)   Hydrochlorothiazide 25 Mg Tabs (Hydrochlorothiazide) .... Take 1 tablet by mouth once a day 4)  Metformin Hcl 500 Mg Tabs (Metformin hcl) .... Take 1 tablet by mouth twice a day 5)  Aspir-low 81 Mg Tbec (Aspirin) .... Take 1 tablet by mouth once a day 6)  Lisinopril 20 Mg Tabs (Lisinopril) .... Take 1 tablet by mouth once a day 7)  Mag-ox 400 400  Mg Tabs (Magnesium oxide) .... Take 1 tablet by mouth two times a day   Patient Instructions: 1)  Limit intake of Sodium (Salt). 2)  The patient was encouraged to lose weight for better health.    3)  Follow up in 10-14 days.  Laboratory Results   Blood Tests   Date/Time Recieved: December 27, 2006 12:35 PM  Date/Time Reported: December 27, 2006 12:35 PM ..................................................................Marland KitchenAlric Quan  December 27, 2006 12:35 PM   Glucose (random): 152 mg/dL   (Normal Range: 16-109) HGBA1C: 7.0%   (Normal Range: Non-Diabetic - 3-6%   Control Diabetic - 6-8%)   Prescriptions: MAG-OX 400 400 MG TABS (MAGNESIUM OXIDE) Take 1 tablet by mouth two times a day  #60 x 3   Entered and Authorized by:   Artist Beach MD   Signed by:   Artist Beach MD on 12/28/2006   Method used:   Telephoned to ...         RxID:   [id number] LISINOPRIL 20 MG TABS (LISINOPRIL) Take 1 tablet by mouth once a day  #30 x 0   Entered and Authorized by:   Artist Beach MD   Signed by:   Artist Beach MD on 12/27/2006   Method used:   Samples Given   RxID:   [id number]

## 2010-12-02 NOTE — Progress Notes (Signed)
Summary: Refill/gh  Phone Note Refill Request Message from:  Pharmacy on September 10, 2007 9:34 AM  Refills Requested: Medication #1:  CATAPRES 0.1 MG TABS Take 1 tablet by mouth three times a day   Last Refilled: 07/11/2007  Method Requested: Electronic Initial call taken by: Angelina Ok RN,  September 10, 2007 9:34 AM  Follow-up for Phone Call        Done. Follow-up by: Ned Grace MD,  September 12, 2007 12:07 PM  Additional Follow-up for Phone Call Additional follow up Details #1::       Additional Follow-up by: Angelina Ok RN,  September 13, 2007 10:35 AM      Prescriptions: CATAPRES 0.1 MG TABS (CLONIDINE HCL) Take 1 tablet by mouth three times a day  #90 x 3   Entered and Authorized by:   Ned Grace MD   Signed by:   Ned Grace MD on 09/12/2007   Method used:   Electronically sent to ...       [address]       [address]       [place], [place]  [address]       Ph: [phone]       Fax: [phone]   RxID:   [id number] CATAPRES 0.1 MG TABS (CLONIDINE HCL) Take 1 tablet by mouth three times a day  #90 x 1   Entered and Authorized by:   Ned Grace MD   Signed by:   Ned Grace MD on 09/12/2007   Method used:   Electronically sent to ...       [address]       [address]       [place], [place]  [address]       Ph: [phone]       Fax: [phone]   RxID:   [id number]

## 2010-12-06 ENCOUNTER — Other Ambulatory Visit: Payer: Self-pay | Admitting: Internal Medicine

## 2010-12-06 MED ORDER — ASPIRIN 325 MG PO TABS: 325.0000 mg | ORAL_TABLET | Freq: Every day | ORAL | Status: DC

## 2010-12-23 ENCOUNTER — Ambulatory Visit (INDEPENDENT_AMBULATORY_CARE_PROVIDER_SITE_OTHER): Payer: Medicaid Other | Admitting: Internal Medicine

## 2010-12-23 ENCOUNTER — Ambulatory Visit: Payer: Self-pay | Admitting: Dietician

## 2010-12-23 ENCOUNTER — Encounter: Payer: Self-pay | Admitting: Internal Medicine

## 2010-12-23 DIAGNOSIS — F419 Anxiety disorder, unspecified: Secondary | ICD-10-CM

## 2010-12-23 DIAGNOSIS — I471 Supraventricular tachycardia, unspecified: Secondary | ICD-10-CM

## 2010-12-23 DIAGNOSIS — F411 Generalized anxiety disorder: Secondary | ICD-10-CM

## 2010-12-23 DIAGNOSIS — R635 Abnormal weight gain: Secondary | ICD-10-CM

## 2010-12-23 DIAGNOSIS — Z0000 Encounter for general adult medical examination without abnormal findings: Secondary | ICD-10-CM

## 2010-12-23 DIAGNOSIS — F341 Dysthymic disorder: Secondary | ICD-10-CM

## 2010-12-23 DIAGNOSIS — I10 Essential (primary) hypertension: Secondary | ICD-10-CM

## 2010-12-23 DIAGNOSIS — F1010 Alcohol abuse, uncomplicated: Secondary | ICD-10-CM

## 2010-12-23 DIAGNOSIS — E119 Type 2 diabetes mellitus without complications: Secondary | ICD-10-CM

## 2010-12-23 LAB — GLUCOSE, CAPILLARY: Glucose-Capillary: 63 mg/dL — ABNORMAL LOW (ref 70–99)

## 2010-12-23 LAB — POCT GLYCOSYLATED HEMOGLOBIN (HGB A1C): Hemoglobin A1C: 7

## 2010-12-23 MED ORDER — ALPRAZOLAM 0.25 MG PO TABS: ORAL_TABLET | ORAL | Status: DC

## 2010-12-23 MED ORDER — OLMESARTAN MEDOXOMIL 40 MG PO TABS: 40.0000 mg | ORAL_TABLET | Freq: Every day | ORAL | Status: DC

## 2010-12-23 NOTE — Assessment & Plan Note (Signed)
Hemoglobin A1c of 7 today.  Patient is currently on metformin and glipizide and she states she takes her medications regularly as scheduled. Foot exam done in November 2011 and was within normal limits. No complaints of increased thirst or urinary frequency. Urine microalbumin checked in November 2011 and was within normal limits so no need for ACEi. Will need to request records for diabetic eye exam. Otherwise, will not make any changes to her medication regimen today.

## 2010-12-23 NOTE — Patient Instructions (Addendum)
Great job on trying to lose weight. Always know that we are here to help you if you need additional guidance. You will start feeling a lot better once you start losing weight. Note I have increased the dose of your Benicar. It is a medication for high blood pressure.  Let us know if you have any questions or concerns.     Calorie Counting Diet   A calorie counting diet requires you to eat the number of calories that are right for you during a day. Calories are the measurement of how much energy you get from the food you eat. Eating the right amount of calories is important for staying at a healthy weight. If you eat too many calories your body will store them as fat and you may gain weight. If you eat too few calories you may lose weight. Counting the number of calories that you eat during a day will help you to know if you're eating the right amount. A Registered Dietitian can determine how many calories you need in a day. The amount of calories you need varies from person to person.   If your goal is to lose weight you will need to eat fewer calories. Losing weight can benefit you if you are overweight or have health problems such as heart disease, high blood pressure or diabetes. If your goal is to gain weight, you will need to eat more calories. Gaining weight may be necessary if you have a certain health problem that causes your body to need more energy.   TIPS Whether you are increasing or decreasing the number of calories you eat during a day, it may be hard to get used to changing what you eat and drink. The following are tips to help you keep track of the number of calories you are eating.    Measuring foods at home with measuring cups will help you to know the actual amount of food and number of calories you are eating.    Restaurants serve food in all different portion sizes. It is common that restaurants will serve food in amounts worth 2 or more serving sizes. While eating out, it may  be helpful to estimate how many servings of a food you are given. For example, a serving of cooked rice is 1/2 cup and that is the size of half of a fist. Knowing serving sizes will help you have a better idea of how much food you are eating at restaurants.  Ask for smaller portion sizes or child-size portions at restaurants.  Plan to eat half of a meal at a restaurant and take the rest home or share the other half with a friend  Read food labels for calorie content and serving size l Most packaged food has a Nutrition Facts Panel on its side or back. Here you can find out how many servings are in a package, the size of a serving, and the number of calories each serving has.    The serving size and number of servings per container are listed right below the Nutrition Facts heading.  Just below the serving information, the number of calories in each serving is listed.   l For example, say that a package has three cookies inside. The Nutrition Facts panel says that one serving is one cookie. Below that, it says that there are three servings in the container. The calories section of the Nutrition Facts says there are 90 calories. That means that there are 90 calories  in one cookie. If you eat one cookie you have eaten 90 calories. If you eat all three cookies, you have eaten three times that amount, or 270 calories.       The list below tells you how big or small some common portion sizes are.  1 ounce (oz).................4 stacked dice.  3 oz.............................Marland KitchenDeck of cards.  1 teaspoon (tsp)..........Marland KitchenTip of little finger.  1 tablespoon (Tbsp).Marland KitchenMarland KitchenMarland KitchenTip of thumb.  2 Tbsp.........................Marland KitchenGolf ball.   Cup.........................Marland KitchenHalf of a fist.  1 Cup..........................Marland KitchenA fist.     KEEP A FOOD LOG Write down every food item that you eat, how much of the food you eat, and the number of calories in each food that you eat during the day.  At the end of the day or  throughout the day you can add up the total number of calories you have eaten.     It may help to set up a list like the one below.  Find out the calorie information by reading food labels.  Breakfast l Bran Flakes (1 cup, 110 calories). l Fat free milk ( cup, 45 calories).  Snack l Apple (1 medium, 80 calories).  Lunch l Spinach (1 cup, 20 calories). l Tomato ( medium, 20 calories). l Chicken breast strips (3 oz, 165 calories). l Shredded cheddar cheese ( cup, 110 calories). l Light [place] dressing (2 Tbsp, 60 calories). l Whole wheat bread (1 slice, 80 calories). l Tub margarine (1 tsp, 35 calories).  l Vegetable soup (1 cup, 160 calories).  Dinner l Pork chop (3 oz, 190 calories). l Brown rice (1 cup, 215 calories). l Steamed broccoli ( cup, 20 calories). l Strawberries (1  cup, 65 calories). l Whipped cream (1 Tbsp, 50 calories).   Daily Calorie Total: 1425 Information from www.eatright.org, Foodwise Nutritional Analysis Database.   Document Released: 10/17/2005  Document Re-Released: 11/08/2009 [facility] Patient Information 2011 [place], [place].

## 2010-12-23 NOTE — Assessment & Plan Note (Signed)
No recent episodes and was seen by Dr. Hillis Range on October 22, 2010 and recommended continuing medical management.  An EKG at that time showed normal sinus rhythm.

## 2010-12-23 NOTE — Assessment & Plan Note (Addendum)
Patient is on Zoloft and Xanax for her anxiety. She expresses concern today about the addictive potential of Xanax and states that she isn't sure if she really needs it. However she states that after a few days of not taking it, she begins to feel a bit nervous and sick. I informed her that we can slowly wean her off the Xanax and have her take only 1 tablet every 3 days and see how this works for her over the next one month. Pls note, patient was not given a script for Xanax today, she states she still has some pills at home.

## 2010-12-23 NOTE — Assessment & Plan Note (Addendum)
Antihypertensive regimen consists of Coreg 25 twice a day, Benicar 20 mg daily, Cardizem 360 mg daily, and hydrochlorothiazide 25 mg daily. However she has not been taking her hydrochlorothiazide because because it makes her dizzy. Basic metabolic panel was normal in July 2011, there were no recent changes in medication regimen.  -  Discontinue hydrochlorothiazide due to side effect of dizziness.   -  Increase Benicar to 40 mg daily for better blood pressure control given her comorbidity of that diabetes which requires a goal     blood pressure of less than 130/80. If she continues to remain dizzy on this, will need to back up off this and try a different antihypertensive, keeping in mind that the patient is reluctant to add additional medications to her current regimen.

## 2010-12-23 NOTE — Assessment & Plan Note (Signed)
Patient states that she has quit drinking and smoking altogether. Commended her on this effort and will continue to follow.

## 2010-12-23 NOTE — Progress Notes (Signed)
  Subjective:    Patient ID: Allison Caldwell, female    DOB: 10-02-1946, 65 y.o.   MRN: [id number]  HPI  The patient is a 65 year old female with history of SVT on medical management, being followed by [facility] cardiology, hypertension, hyperlipidemia, and type 2 diabetes here on routine follow-up.  Patient's only complaint today is that she's been gaining weight. Patient noted that she's gained a large amount of weight since quitting smoking and alcohol.  She has gained 10 pounds in the last 3 months.  She states that there hasn't been necessarily any changes in her dieting habits however patient does live a sedentary lifestyle she does not work currently and is not physically active.  She states she eats a bowl of grits every morning while dinner and lunch usually consists of chicken and greens as well as a variety of diet sodas and fruit juices.   She states that she stopped taking her HCTZ because it makes her feel dizzy.  Otherwise she has no other complaints.    Review of Systems  Constitutional: Negative for fever and chills.  Respiratory: Negative for shortness of breath.   Cardiovascular: Negative for chest pain and palpitations.  Gastrointestinal: Negative for nausea and vomiting.  Genitourinary: Negative for dysuria.  Neurological: Negative for weakness.       Objective:   Physical Exam  Constitutional: She is oriented to person, place, and time. She appears well-developed and well-nourished. No distress.  Cardiovascular: Normal rate, regular rhythm and normal heart sounds.  Exam reveals no gallop and no friction rub.   No murmur heard. Pulmonary/Chest: Effort normal and breath sounds normal. No respiratory distress. She has no wheezes. She has no rales.  Abdominal: Soft. Bowel sounds are normal. There is no tenderness.  Musculoskeletal: Normal range of motion.  Neurological: She is alert and oriented to person, place, and time.  Psychiatric: She has a normal mood and affect.        Assessment & Plan:

## 2010-12-23 NOTE — Assessment & Plan Note (Addendum)
This is the patient's main concern today as she has gained 10 pounds in the past 3 months. Patient's BMI is currently 49.  Patient clearly does not have a healthy dieting lifestyle nor does she engage in any physical activity.  The patient was counseled extensively on weight loss strategies to include lifestyle modification such as dieting and exercise and she was very receptive to this information and is willing to try. She was provided information regarding available community resources such as the [employer], the [employer] exercise program and the [employer] center. For her diet, she was counseled to cut out all sodas and drink plain water instead. She was also counseled to minimize her daily am grits and to incorporate more fruits and vegetables. Will see how she does over the next 3 months.

## 2010-12-23 NOTE — Assessment & Plan Note (Signed)
-   Up to date on her mammogram which was done on December 2011 and it showed a BI-RADS 1. - Patient declined colonoscopy during last visit, discuss hemoccult cards at next visit. - Refer to women's health for a DEXA scan at next visit.

## 2010-12-29 ENCOUNTER — Other Ambulatory Visit: Payer: Self-pay | Admitting: Internal Medicine

## 2010-12-29 NOTE — Telephone Encounter (Signed)
Rx called in 

## 2010-12-30 ENCOUNTER — Telehealth: Payer: Self-pay | Admitting: *Deleted

## 2010-12-30 NOTE — Telephone Encounter (Signed)
Please find out how the pharmacy last filled this: dose, frequency, number dispensed, and date.

## 2010-12-30 NOTE — Telephone Encounter (Signed)
Please review the directions for the xanax...1 every 3 days? Should be 1 Daily, possibly?Marland Kitchen..please advise  Thanks,h.

## 2011-01-06 ENCOUNTER — Telehealth: Payer: Self-pay | Admitting: Licensed Clinical Social Worker

## 2011-01-11 LAB — GLUCOSE, CAPILLARY
Glucose-Capillary: 113 mg/dL — ABNORMAL HIGH (ref 70–99)
Glucose-Capillary: 49 mg/dL — ABNORMAL LOW (ref 70–99)

## 2011-01-13 NOTE — Telephone Encounter (Signed)
Patient requesting information for weight loss and healthy lifestyle.  Sending her information on [employer] program as well as other area exercise programs--[facility].

## 2011-01-16 LAB — GLUCOSE, CAPILLARY: Glucose-Capillary: 137 mg/dL — ABNORMAL HIGH (ref 70–99)

## 2011-01-17 LAB — COMPREHENSIVE METABOLIC PANEL
ALT: 38 U/L — ABNORMAL HIGH (ref 0–35)
AST: 44 U/L — ABNORMAL HIGH (ref 0–37)
Albumin: 4.2 g/dL (ref 3.5–5.2)
Alkaline Phosphatase: 53 U/L (ref 39–117)
BUN: 2 mg/dL — ABNORMAL LOW (ref 6–23)
CO2: 32 mEq/L (ref 19–32)
Calcium: 8.8 mg/dL (ref 8.4–10.5)
Chloride: 99 mEq/L (ref 96–112)
Creatinine, Ser: 0.51 mg/dL (ref 0.4–1.2)
GFR calc Af Amer: >60 mL/min (ref >60)
GFR calc non Af Amer: >60 mL/min (ref >60)
Glucose, Bld: 126 mg/dL — ABNORMAL HIGH (ref 70–99)
Potassium: 2.9 mEq/L — ABNORMAL LOW (ref 3.5–5.1)
Sodium: 141 mEq/L (ref 135–145)
Total Bilirubin: 0.9 mg/dL (ref 0.3–1.2)
Total Protein: 7.6 g/dL (ref 6.0–8.3)

## 2011-01-17 LAB — CBC
HCT: 43.4 % (ref 36.0–46.0)
HCT: 45.2 % (ref 36.0–46.0)
HCT: 51.3 % — ABNORMAL HIGH (ref 36.0–46.0)
Hemoglobin: 14.8 g/dL (ref 12.0–15.0)
Hemoglobin: 15.6 g/dL — ABNORMAL HIGH (ref 12.0–15.0)
Hemoglobin: 17.2 g/dL — ABNORMAL HIGH (ref 12.0–15.0)
MCHC: 34 g/dL (ref 30.0–36.0)
MCHC: 34.5 g/dL (ref 30.0–36.0)
MCHC: 33.4 g/dL (ref 30.0–36.0)
MCV: 95.7 fL (ref 78.0–100.0)
MCV: 96.2 fL (ref 78.0–100.0)
MCV: 96.6 fL (ref 78.0–100.0)
Platelets: 245 10*3/uL (ref 150–400)
Platelets: 249 10*3/uL (ref 150–400)
Platelets: 275 10*3/uL (ref 150–400)
RBC: 4.53 MIL/uL (ref 3.87–5.11)
RBC: 4.7 MIL/uL (ref 3.87–5.11)
RBC: 5.31 MIL/uL — ABNORMAL HIGH (ref 3.87–5.11)
RDW: 14.4 % (ref 11.5–15.5)
RDW: 14.9 % (ref 11.5–15.5)
RDW: 14.9 % (ref 11.5–15.5)
WBC: 5.9 10*3/uL (ref 4.0–10.5)
WBC: 6.7 10*3/uL (ref 4.0–10.5)
WBC: 7.3 10*3/uL (ref 4.0–10.5)

## 2011-01-17 LAB — PHOSPHORUS
Phosphorus: 2.8 mg/dL (ref 2.3–4.6)
Phosphorus: 3.9 mg/dL (ref 2.3–4.6)
Phosphorus: 4.2 mg/dL (ref 2.3–4.6)

## 2011-01-17 LAB — BASIC METABOLIC PANEL
BUN: 11 mg/dL (ref 6–23)
BUN: 3 mg/dL — ABNORMAL LOW (ref 6–23)
BUN: 6 mg/dL (ref 6–23)
BUN: 8 mg/dL (ref 6–23)
CO2: 26 mEq/L (ref 19–32)
CO2: 27 mEq/L (ref 19–32)
CO2: 27 mEq/L (ref 19–32)
CO2: 30 mEq/L (ref 19–32)
Calcium: 8.2 mg/dL — ABNORMAL LOW (ref 8.4–10.5)
Calcium: 8.6 mg/dL (ref 8.4–10.5)
Calcium: 8.8 mg/dL (ref 8.4–10.5)
Calcium: 9 mg/dL (ref 8.4–10.5)
Chloride: 101 mEq/L (ref 96–112)
Chloride: 103 mEq/L (ref 96–112)
Chloride: 105 mEq/L (ref 96–112)
Chloride: 105 mEq/L (ref 96–112)
Creatinine, Ser: 0.46 mg/dL (ref 0.4–1.2)
Creatinine, Ser: 0.51 mg/dL (ref 0.4–1.2)
Creatinine, Ser: 0.56 mg/dL (ref 0.4–1.2)
Creatinine, Ser: 0.68 mg/dL (ref 0.4–1.2)
GFR calc Af Amer: >60 mL/min (ref >60)
GFR calc Af Amer: >60 mL/min (ref >60)
GFR calc Af Amer: >60 mL/min (ref >60)
GFR calc Af Amer: >60 mL/min (ref >60)
GFR calc non Af Amer: >60 mL/min (ref >60)
GFR calc non Af Amer: >60 mL/min (ref >60)
GFR calc non Af Amer: >60 mL/min (ref >60)
GFR calc non Af Amer: >60 mL/min (ref >60)
Glucose, Bld: 104 mg/dL — ABNORMAL HIGH (ref 70–99)
Glucose, Bld: 143 mg/dL — ABNORMAL HIGH (ref 70–99)
Glucose, Bld: 156 mg/dL — ABNORMAL HIGH (ref 70–99)
Glucose, Bld: 251 mg/dL — ABNORMAL HIGH (ref 70–99)
Potassium: 3.2 mEq/L — ABNORMAL LOW (ref 3.5–5.1)
Potassium: 3.6 mEq/L (ref 3.5–5.1)
Potassium: 3.8 mEq/L (ref 3.5–5.1)
Potassium: 4.1 mEq/L (ref 3.5–5.1)
Sodium: 138 mEq/L (ref 135–145)
Sodium: 139 mEq/L (ref 135–145)
Sodium: 141 mEq/L (ref 135–145)
Sodium: 141 mEq/L (ref 135–145)

## 2011-01-17 LAB — CK TOTAL AND CKMB (NOT AT ARMC)
CK, MB: 1.7 ng/mL (ref 0.3–4.0)
CK, MB: 1.9 ng/mL (ref 0.3–4.0)
CK, MB: 2.5 ng/mL (ref 0.3–4.0)
Relative Index: 1 (ref 0.0–2.5)
Relative Index: 1.2 (ref 0.0–2.5)
Relative Index: 1.4 (ref 0.0–2.5)
Total CK: 158 U/L (ref 7–177)
Total CK: 162 U/L (ref 7–177)
Total CK: 185 U/L — ABNORMAL HIGH (ref 7–177)

## 2011-01-17 LAB — URINE CULTURE
Colony Count: NO GROWTH
Culture: NO GROWTH

## 2011-01-17 LAB — GC/CHLAMYDIA PROBE AMP, URINE
Chlamydia, Swab/Urine, PCR: NEGATIVE
GC Probe Amp, Urine: NEGATIVE

## 2011-01-17 LAB — GLUCOSE, CAPILLARY
Glucose-Capillary: 112 mg/dL — ABNORMAL HIGH (ref 70–99)
Glucose-Capillary: 125 mg/dL — ABNORMAL HIGH (ref 70–99)
Glucose-Capillary: 135 mg/dL — ABNORMAL HIGH (ref 70–99)
Glucose-Capillary: 135 mg/dL — ABNORMAL HIGH (ref 70–99)
Glucose-Capillary: 152 mg/dL — ABNORMAL HIGH (ref 70–99)
Glucose-Capillary: 160 mg/dL — ABNORMAL HIGH (ref 70–99)
Glucose-Capillary: 163 mg/dL — ABNORMAL HIGH (ref 70–99)
Glucose-Capillary: 169 mg/dL — ABNORMAL HIGH (ref 70–99)
Glucose-Capillary: 170 mg/dL — ABNORMAL HIGH (ref 70–99)
Glucose-Capillary: 175 mg/dL — ABNORMAL HIGH (ref 70–99)
Glucose-Capillary: 190 mg/dL — ABNORMAL HIGH (ref 70–99)
Glucose-Capillary: 253 mg/dL — ABNORMAL HIGH (ref 70–99)
Glucose-Capillary: 287 mg/dL — ABNORMAL HIGH (ref 70–99)
Glucose-Capillary: 90 mg/dL (ref 70–99)
Glucose-Capillary: 93 mg/dL (ref 70–99)

## 2011-01-17 LAB — URINALYSIS, ROUTINE W REFLEX MICROSCOPIC
Bilirubin Urine: NEGATIVE
Glucose, UA: NEGATIVE mg/dL
Hgb urine dipstick: NEGATIVE
Ketones, ur: 15 mg/dL — AB
Leukocytes, UA: NEGATIVE
Nitrite: NEGATIVE
Protein, ur: 100 mg/dL — AB
Specific Gravity, Urine: 1.014 (ref 1.005–1.030)
Urobilinogen, UA: 1 mg/dL (ref 0.0–1.0)
pH: 6.5 (ref 5.0–8.0)

## 2011-01-17 LAB — HIV ANTIBODY (ROUTINE TESTING W REFLEX): HIV: NONREACTIVE

## 2011-01-17 LAB — DRUGS OF ABUSE SCREEN W/O ALC, ROUTINE URINE
Amphetamine Screen, Ur: NEGATIVE
Barbiturate Quant, Ur: NEGATIVE
Benzodiazepines.: NEGATIVE
Cocaine Metabolites: NEGATIVE
Creatinine,U: 47.3 mg/dL
Marijuana Metabolite: NEGATIVE
Methadone: NEGATIVE
Opiate Screen, Urine: NEGATIVE
Phencyclidine (PCP): NEGATIVE
Propoxyphene: NEGATIVE

## 2011-01-17 LAB — POCT I-STAT, CHEM 8
BUN: 4 mg/dL — ABNORMAL LOW (ref 6–23)
Calcium, Ion: 0.93 mmol/L — ABNORMAL LOW (ref 1.12–1.32)
Chloride: 94 mEq/L — ABNORMAL LOW (ref 96–112)
Creatinine, Ser: 0.5 mg/dL (ref 0.4–1.2)
Glucose, Bld: 189 mg/dL — ABNORMAL HIGH (ref 70–99)
HCT: 55 % — ABNORMAL HIGH (ref 36.0–46.0)
Hemoglobin: 18.7 g/dL — ABNORMAL HIGH (ref 12.0–15.0)
Potassium: 3.3 mEq/L — ABNORMAL LOW (ref 3.5–5.1)
Sodium: 135 mEq/L (ref 135–145)
TCO2: 29 mmol/L (ref 0–100)

## 2011-01-17 LAB — LIPID PANEL
Cholesterol: 182 mg/dL (ref 0–200)
HDL: 98 mg/dL (ref >39)
LDL Cholesterol: 73 mg/dL (ref 0–99)
Total CHOL/HDL Ratio: 1.9 RATIO
Triglycerides: 54 mg/dL (ref <150)
VLDL: 11 mg/dL (ref 0–40)

## 2011-01-17 LAB — DIFFERENTIAL
Basophils Absolute: 0 10*3/uL (ref 0.0–0.1)
Basophils Relative: 0 % (ref 0–1)
Eosinophils Absolute: 0 10*3/uL (ref 0.0–0.7)
Eosinophils Relative: 0 % (ref 0–5)
Lymphocytes Relative: 21 % (ref 12–46)
Lymphs Abs: 1.5 10*3/uL (ref 0.7–4.0)
Monocytes Absolute: 0.4 10*3/uL (ref 0.1–1.0)
Monocytes Relative: 6 % (ref 3–12)
Neutro Abs: 5.3 10*3/uL (ref 1.7–7.7)
Neutrophils Relative %: 72 % (ref 43–77)

## 2011-01-17 LAB — MAGNESIUM
Magnesium: 1.4 mg/dL — ABNORMAL LOW (ref 1.5–2.5)
Magnesium: 1.9 mg/dL (ref 1.5–2.5)
Magnesium: 2.2 mg/dL (ref 1.5–2.5)

## 2011-01-17 LAB — TROPONIN I
Troponin I: 0.07 ng/mL — ABNORMAL HIGH (ref 0.00–0.06)
Troponin I: 0.11 ng/mL — ABNORMAL HIGH (ref 0.00–0.06)
Troponin I: 0.25 ng/mL — ABNORMAL HIGH (ref 0.00–0.06)

## 2011-01-17 LAB — POCT CARDIAC MARKERS
CKMB, poc: 1.9 ng/mL (ref 1.0–8.0)
Myoglobin, poc: 246 ng/mL (ref 12–200)
Troponin i, poc: <0.05 ng/mL (ref 0.00–0.09)

## 2011-01-17 LAB — URINE MICROSCOPIC-ADD ON

## 2011-01-17 LAB — T4, FREE: Free T4: 1.16 ng/dL (ref 0.80–1.80)

## 2011-01-17 LAB — TSH: TSH: 1.504 u[IU]/mL (ref 0.350–4.500)

## 2011-01-17 LAB — HEMOGLOBIN A1C
Hgb A1c MFr Bld: 6.5 % — ABNORMAL HIGH (ref <5.7)
Mean Plasma Glucose: 140 mg/dL — ABNORMAL HIGH (ref <117)

## 2011-01-17 LAB — RPR: RPR Ser Ql: NONREACTIVE

## 2011-01-17 LAB — VITAMIN D 25 HYDROXY (VIT D DEFICIENCY, FRACTURES): Vit D, 25-Hydroxy: 76 ng/mL (ref 30–89)

## 2011-01-17 LAB — PTH-RELATED PEPTIDE: PTH-related peptide: 15

## 2011-01-17 LAB — CALCIUM, IONIZED
Calcium, Ion: 1.15 mmol/L (ref 1.12–1.32)
Calcium, Ion: 1.3 mmol/L (ref 1.12–1.32)

## 2011-01-17 LAB — D-DIMER, QUANTITATIVE (NOT AT ARMC): D-Dimer, Quant: <0.22 ug/mL-FEU (ref 0.00–0.48)

## 2011-01-21 ENCOUNTER — Other Ambulatory Visit: Payer: Self-pay | Admitting: *Deleted

## 2011-01-21 MED ORDER — METFORMIN HCL 500 MG PO TABS: 500.0000 mg | ORAL_TABLET | Freq: Two times a day (BID) | ORAL | Status: DC

## 2011-01-31 LAB — GLUCOSE, CAPILLARY: Glucose-Capillary: 80 mg/dL (ref 70–99)

## 2011-02-06 LAB — RAPID URINE DRUG SCREEN, HOSP PERFORMED
Amphetamines: NOT DETECTED
Barbiturates: NOT DETECTED
Benzodiazepines: NOT DETECTED
Cocaine: NOT DETECTED
Opiates: NOT DETECTED
Tetrahydrocannabinol: NOT DETECTED

## 2011-02-06 LAB — DIFFERENTIAL
Basophils Absolute: 0.2 10*3/uL — ABNORMAL HIGH (ref 0.0–0.1)
Basophils Relative: 3 % — ABNORMAL HIGH (ref 0–1)
Eosinophils Absolute: 0.1 10*3/uL (ref 0.0–0.7)
Eosinophils Relative: 1 % (ref 0–5)
Lymphocytes Relative: 43 % (ref 12–46)
Lymphs Abs: 3.9 10*3/uL (ref 0.7–4.0)
Monocytes Absolute: 0.4 10*3/uL (ref 0.1–1.0)
Monocytes Relative: 5 % (ref 3–12)
Neutro Abs: 4.4 10*3/uL (ref 1.7–7.7)
Neutrophils Relative %: 49 % (ref 43–77)

## 2011-02-06 LAB — CBC
HCT: 43.5 % (ref 36.0–46.0)
Hemoglobin: 14.6 g/dL (ref 12.0–15.0)
MCHC: 33.6 g/dL (ref 30.0–36.0)
MCV: 93.4 fL (ref 78.0–100.0)
Platelets: 239 10*3/uL (ref 150–400)
RBC: 4.65 MIL/uL (ref 3.87–5.11)
RDW: 14.8 % (ref 11.5–15.5)
WBC: 9 10*3/uL (ref 4.0–10.5)

## 2011-02-06 LAB — ETHANOL: Alcohol, Ethyl (B): 242 mg/dL — ABNORMAL HIGH (ref 0–10)

## 2011-02-06 LAB — POCT CARDIAC MARKERS
CKMB, poc: 1.2 ng/mL (ref 1.0–8.0)
CKMB, poc: <1 ng/mL — ABNORMAL LOW (ref 1.0–8.0)
Myoglobin, poc: 43 ng/mL (ref 12–200)
Myoglobin, poc: 43.8 ng/mL (ref 12–200)
Troponin i, poc: <0.05 ng/mL (ref 0.00–0.09)
Troponin i, poc: <0.05 ng/mL (ref 0.00–0.09)

## 2011-02-06 LAB — GLUCOSE, CAPILLARY: Glucose-Capillary: 87 mg/dL (ref 70–99)

## 2011-02-06 LAB — URINALYSIS, ROUTINE W REFLEX MICROSCOPIC
Bilirubin Urine: NEGATIVE
Glucose, UA: NEGATIVE mg/dL
Hgb urine dipstick: NEGATIVE
Ketones, ur: NEGATIVE mg/dL
Nitrite: NEGATIVE
Protein, ur: NEGATIVE mg/dL
Specific Gravity, Urine: 1.002 — ABNORMAL LOW (ref 1.005–1.030)
Urobilinogen, UA: 0.2 mg/dL (ref 0.0–1.0)
pH: 5 (ref 5.0–8.0)

## 2011-02-06 LAB — COMPREHENSIVE METABOLIC PANEL
ALT: 18 U/L (ref 0–35)
AST: 25 U/L (ref 0–37)
Albumin: 4.3 g/dL (ref 3.5–5.2)
Alkaline Phosphatase: 42 U/L (ref 39–117)
BUN: 6 mg/dL (ref 6–23)
CO2: 24 mEq/L (ref 19–32)
Calcium: 8.9 mg/dL (ref 8.4–10.5)
Chloride: 94 mEq/L — ABNORMAL LOW (ref 96–112)
Creatinine, Ser: 0.48 mg/dL (ref 0.4–1.2)
GFR calc Af Amer: >60 mL/min (ref >60)
GFR calc non Af Amer: >60 mL/min (ref >60)
Glucose, Bld: 89 mg/dL (ref 70–99)
Potassium: 3.3 mEq/L — ABNORMAL LOW (ref 3.5–5.1)
Sodium: 134 mEq/L — ABNORMAL LOW (ref 135–145)
Total Bilirubin: 0.7 mg/dL (ref 0.3–1.2)
Total Protein: 7.6 g/dL (ref 6.0–8.3)

## 2011-02-06 LAB — URINE MICROSCOPIC-ADD ON

## 2011-02-07 LAB — DIFFERENTIAL
Basophils Absolute: 0.1 10*3/uL (ref 0.0–0.1)
Basophils Relative: 1 % (ref 0–1)
Eosinophils Absolute: 0.1 10*3/uL (ref 0.0–0.7)
Eosinophils Relative: 1 % (ref 0–5)
Lymphocytes Relative: 47 % — ABNORMAL HIGH (ref 12–46)
Lymphs Abs: 4.7 10*3/uL — ABNORMAL HIGH (ref 0.7–4.0)
Monocytes Absolute: 0.5 10*3/uL (ref 0.1–1.0)
Monocytes Relative: 5 % (ref 3–12)
Neutro Abs: 4.5 10*3/uL (ref 1.7–7.7)
Neutrophils Relative %: 46 % (ref 43–77)

## 2011-02-07 LAB — COMPREHENSIVE METABOLIC PANEL
ALT: 16 U/L (ref 0–35)
AST: 21 U/L (ref 0–37)
Albumin: 4.1 g/dL (ref 3.5–5.2)
Alkaline Phosphatase: 42 U/L (ref 39–117)
BUN: 4 mg/dL — ABNORMAL LOW (ref 6–23)
CO2: 27 mEq/L (ref 19–32)
Calcium: 9.3 mg/dL (ref 8.4–10.5)
Chloride: 95 mEq/L — ABNORMAL LOW (ref 96–112)
Creatinine, Ser: 0.49 mg/dL (ref 0.4–1.2)
GFR calc Af Amer: >60 mL/min (ref >60)
GFR calc non Af Amer: >60 mL/min (ref >60)
Glucose, Bld: 114 mg/dL — ABNORMAL HIGH (ref 70–99)
Potassium: 2.8 mEq/L — ABNORMAL LOW (ref 3.5–5.1)
Sodium: 136 mEq/L (ref 135–145)
Total Bilirubin: 0.5 mg/dL (ref 0.3–1.2)
Total Protein: 7.4 g/dL (ref 6.0–8.3)

## 2011-02-07 LAB — URINE CULTURE: Colony Count: >=100000

## 2011-02-07 LAB — URINE MICROSCOPIC-ADD ON

## 2011-02-07 LAB — URINALYSIS, ROUTINE W REFLEX MICROSCOPIC
Bilirubin Urine: NEGATIVE
Glucose, UA: NEGATIVE mg/dL
Hgb urine dipstick: NEGATIVE
Ketones, ur: NEGATIVE mg/dL
Nitrite: NEGATIVE
Protein, ur: NEGATIVE mg/dL
Specific Gravity, Urine: 1.007 (ref 1.005–1.030)
Urobilinogen, UA: 0.2 mg/dL (ref 0.0–1.0)
pH: 5.5 (ref 5.0–8.0)

## 2011-02-07 LAB — CK TOTAL AND CKMB (NOT AT ARMC)
CK, MB: 1.5 ng/mL (ref 0.3–4.0)
Relative Index: 1 (ref 0.0–2.5)
Total CK: 147 U/L (ref 7–177)

## 2011-02-07 LAB — CBC
HCT: 41.6 % (ref 36.0–46.0)
Hemoglobin: 14.2 g/dL (ref 12.0–15.0)
MCHC: 34.2 g/dL (ref 30.0–36.0)
MCV: 93.3 fL (ref 78.0–100.0)
Platelets: 269 10*3/uL (ref 150–400)
RBC: 4.46 MIL/uL (ref 3.87–5.11)
RDW: 14.6 % (ref 11.5–15.5)
WBC: 10 10*3/uL (ref 4.0–10.5)

## 2011-02-07 LAB — TYPE AND SCREEN
ABO/RH(D): O POS
Antibody Screen: NEGATIVE

## 2011-02-07 LAB — POCT CARDIAC MARKERS
CKMB, poc: <1 ng/mL — ABNORMAL LOW (ref 1.0–8.0)
Myoglobin, poc: 42.5 ng/mL (ref 12–200)
Troponin i, poc: <0.05 ng/mL (ref 0.00–0.09)

## 2011-02-07 LAB — HEMOCCULT GUIAC POC 1CARD (OFFICE): Fecal Occult Bld: POSITIVE

## 2011-02-07 LAB — PROTIME-INR
INR: 0.9 (ref 0.00–1.49)
Prothrombin Time: 12.5 seconds (ref 11.6–15.2)

## 2011-02-07 LAB — TROPONIN I: Troponin I: 0.02 ng/mL (ref 0.00–0.06)

## 2011-02-07 LAB — APTT: aPTT: 35 seconds (ref 24–37)

## 2011-02-07 LAB — ABO/RH: ABO/RH(D): O POS

## 2011-02-07 LAB — LIPASE, BLOOD: Lipase: 33 U/L (ref 11–59)

## 2011-02-07 LAB — ETHANOL: Alcohol, Ethyl (B): 314 mg/dL — ABNORMAL HIGH (ref 0–10)

## 2011-02-10 LAB — GLUCOSE, CAPILLARY: Glucose-Capillary: 152 mg/dL — ABNORMAL HIGH (ref 70–99)

## 2011-02-14 LAB — COMPREHENSIVE METABOLIC PANEL
ALT: 17 U/L (ref 0–35)
AST: 27 U/L (ref 0–37)
Albumin: 3.8 g/dL (ref 3.5–5.2)
Alkaline Phosphatase: 45 U/L (ref 39–117)
BUN: 14 mg/dL (ref 6–23)
CO2: 26 mEq/L (ref 19–32)
Calcium: 9.4 mg/dL (ref 8.4–10.5)
Chloride: 95 mEq/L — ABNORMAL LOW (ref 96–112)
Creatinine, Ser: 0.65 mg/dL (ref 0.4–1.2)
GFR calc Af Amer: >60 mL/min (ref >60)
GFR calc non Af Amer: >60 mL/min (ref >60)
Glucose, Bld: 306 mg/dL — ABNORMAL HIGH (ref 70–99)
Potassium: 4.3 mEq/L (ref 3.5–5.1)
Sodium: 134 mEq/L — ABNORMAL LOW (ref 135–145)
Total Bilirubin: 0.6 mg/dL (ref 0.3–1.2)
Total Protein: 6.9 g/dL (ref 6.0–8.3)

## 2011-02-14 LAB — GLUCOSE, CAPILLARY
Glucose-Capillary: 194 mg/dL — ABNORMAL HIGH (ref 70–99)
Glucose-Capillary: 197 mg/dL — ABNORMAL HIGH (ref 70–99)
Glucose-Capillary: 211 mg/dL — ABNORMAL HIGH (ref 70–99)
Glucose-Capillary: 274 mg/dL — ABNORMAL HIGH (ref 70–99)
Glucose-Capillary: 418 mg/dL — ABNORMAL HIGH (ref 70–99)
Glucose-Capillary: 170 mg/dL — ABNORMAL HIGH (ref 70–99)

## 2011-02-14 LAB — DRUGS OF ABUSE SCREEN W/O ALC, ROUTINE URINE
Amphetamine Screen, Ur: NEGATIVE
Barbiturate Quant, Ur: NEGATIVE
Benzodiazepines.: NEGATIVE
Cocaine Metabolites: NEGATIVE
Creatinine,U: 27.3 mg/dL
Marijuana Metabolite: NEGATIVE
Methadone: NEGATIVE
Opiate Screen, Urine: NEGATIVE
Phencyclidine (PCP): NEGATIVE
Propoxyphene: NEGATIVE

## 2011-02-14 LAB — DIFFERENTIAL
Basophils Absolute: 0.1 10*3/uL (ref 0.0–0.1)
Basophils Relative: 1 % (ref 0–1)
Eosinophils Absolute: 0.1 10*3/uL (ref 0.0–0.7)
Eosinophils Relative: 1 % (ref 0–5)
Lymphocytes Relative: 45 % (ref 12–46)
Lymphs Abs: 4.5 10*3/uL — ABNORMAL HIGH (ref 0.7–4.0)
Monocytes Absolute: 0.6 10*3/uL (ref 0.1–1.0)
Monocytes Relative: 6 % (ref 3–12)
Neutro Abs: 4.7 10*3/uL (ref 1.7–7.7)
Neutrophils Relative %: 48 % (ref 43–77)

## 2011-02-14 LAB — POCT CARDIAC MARKERS
CKMB, poc: <1 ng/mL — ABNORMAL LOW (ref 1.0–8.0)
CKMB, poc: <1 ng/mL — ABNORMAL LOW (ref 1.0–8.0)
Myoglobin, poc: 61.3 ng/mL (ref 12–200)
Myoglobin, poc: 64.7 ng/mL (ref 12–200)
Troponin i, poc: <0.05 ng/mL (ref 0.00–0.09)
Troponin i, poc: <0.05 ng/mL (ref 0.00–0.09)

## 2011-02-14 LAB — BASIC METABOLIC PANEL
BUN: 14 mg/dL (ref 6–23)
CO2: 25 mEq/L (ref 19–32)
Calcium: 9.5 mg/dL (ref 8.4–10.5)
Chloride: 96 mEq/L (ref 96–112)
Creatinine, Ser: 0.57 mg/dL (ref 0.4–1.2)
GFR calc Af Amer: >60 mL/min (ref >60)
GFR calc non Af Amer: >60 mL/min (ref >60)
Glucose, Bld: 162 mg/dL — ABNORMAL HIGH (ref 70–99)
Potassium: 2.8 mEq/L — ABNORMAL LOW (ref 3.5–5.1)
Sodium: 139 mEq/L (ref 135–145)

## 2011-02-14 LAB — CBC
HCT: 47.6 % — ABNORMAL HIGH (ref 36.0–46.0)
Hemoglobin: 16.1 g/dL — ABNORMAL HIGH (ref 12.0–15.0)
MCHC: 33.7 g/dL (ref 30.0–36.0)
MCV: 97.1 fL (ref 78.0–100.0)
Platelets: 349 10*3/uL (ref 150–400)
RBC: 4.91 MIL/uL (ref 3.87–5.11)
RDW: 14.4 % (ref 11.5–15.5)
WBC: 9.9 10*3/uL (ref 4.0–10.5)

## 2011-02-14 LAB — MAGNESIUM: Magnesium: 1.6 mg/dL (ref 1.5–2.5)

## 2011-02-14 LAB — LIPID PANEL
Cholesterol: 201 mg/dL — ABNORMAL HIGH (ref 0–200)
HDL: 86 mg/dL (ref >39)
LDL Cholesterol: 103 mg/dL — ABNORMAL HIGH (ref 0–99)
Total CHOL/HDL Ratio: 2.3 RATIO
Triglycerides: 61 mg/dL (ref <150)
VLDL: 12 mg/dL (ref 0–40)

## 2011-02-14 LAB — D-DIMER, QUANTITATIVE: D-Dimer, Quant: <0.22 ug/mL-FEU (ref 0.00–0.48)

## 2011-02-14 LAB — CARDIAC PANEL(CRET KIN+CKTOT+MB+TROPI)
CK, MB: 3.2 ng/mL (ref 0.3–4.0)
CK, MB: 4.8 ng/mL — ABNORMAL HIGH (ref 0.3–4.0)
Relative Index: 2.5 (ref 0.0–2.5)
Relative Index: 3.2 — ABNORMAL HIGH (ref 0.0–2.5)
Total CK: 126 U/L (ref 7–177)
Total CK: 149 U/L (ref 7–177)
Troponin I: 0.01 ng/mL (ref 0.00–0.06)
Troponin I: <0.01 ng/mL (ref 0.00–0.06)

## 2011-02-14 LAB — ACETAMINOPHEN LEVEL: Acetaminophen (Tylenol), Serum: <10 ug/mL — ABNORMAL LOW (ref 10–30)

## 2011-02-14 LAB — OSMOLALITY: Osmolality: 294 mOsm/kg (ref 275–300)

## 2011-02-14 LAB — HEMOGLOBIN A1C
Hgb A1c MFr Bld: 7 % — ABNORMAL HIGH (ref 4.6–6.1)
Mean Plasma Glucose: 154 mg/dL

## 2011-02-14 LAB — SALICYLATE LEVEL: Salicylate Lvl: <4 mg/dL (ref 2.8–20.0)

## 2011-02-14 LAB — BRAIN NATRIURETIC PEPTIDE: Pro B Natriuretic peptide (BNP): 30.6 pg/mL (ref 0.0–100.0)

## 2011-02-14 LAB — ETHANOL: Alcohol, Ethyl (B): 273 mg/dL — ABNORMAL HIGH (ref 0–10)

## 2011-03-15 NOTE — Discharge Summary (Signed)
NAMEDORICE, STIGGERS                 ACCOUNT NO.:  [id number]   MEDICAL RECORD NO.:  [id number]          PATIENT TYPE:  INP   LOCATION:  4734                         FACILITY:  [facility]   PHYSICIAN:  C. Ulyess Mort, M.D.DATE OF BIRTH:  02/05/1946   DATE OF ADMISSION:  11/02/2008  DATE OF DISCHARGE:  11/03/2008                               DISCHARGE SUMMARY   DISCHARGE DIAGNOSES:  1. Acute bronchitis.  2. Alcohol abuse.  3. Hypokalemia.  4. Diabetes, type 2.  5. Hypertension.  6. Morbid obesity.  7. History of hypomagnesemia.  8. History of cardiomyopathy with an ejection fraction of 50%.   DISCHARGE MEDICATIONS:  1. Clonidine 0.1 mg p.o. b.i.d.  2. Hydrochlorothiazide 25 mg p.o. daily.  3. Metformin 500 mg p.o. b.i.d.  4. Aspirin 81 mg p.o. daily.  5. Lisinopril 40 mg p.o. daily.  6. Amlodipine 10 mg p.o. daily.  7. Doxycycline 100 mg p.o. b.i.d. for 10 days.  8. Prednisone 10 mg by mouth, take 6 tablets on November 04, 2008, then      take 5 tablets on November 05, 2008, then take 4 tablets on November 06, 2008, then take 3 tablets on November 07, 2008, then take 2      tablets on November 08, 2008, then take 1 tablet on November 09, 2008,      then stop.   DISCHARGE CONDITION AND FOLLOWUP:  The patient was discharged in stable  condition after her respiratory status had improved after treatment with  Solu-Medrol and azithromycin.  She will be discharged on a prednisone  taper as well as with a prescription for doxycycline.  She is to follow  up with Dr. Janyth Pupa in the outpatient clinic on November 20, 2008.  Her  appointment is at 2 p.m., but she will be asked to arrive at 1:30 p.m.  for a BMET to check her potassium.  Other items to address at followup  include referral for pulmonary function tests to assess whether or not  she actually has COPD as well as to assess her drinking and whether or  not she has followed up with Alcoholics Anonymous.   PROCEDURES PERFORMED:   None.   BRIEF ADMITTING HISTORY AND PHYSICAL:  For full details, please see the  hospital chart, but in brief Ms. Rahilly is a 65 year old woman who  presented with complaint of cough and dyspnea and wheezing with a 2-week  history of sinus and nasal congestion and cough preceding this.  She had  been receiving minimal relief from Tussionex and NyQuil at home, but she  had no fevers or chills.  She had been feeling tight in her chest since  she presented to the ED.   PHYSICAL EXAMINATION:  VITAL SIGNS:  Temperature 98.5, pulse 110, blood  pressure 128/74, respirations 24, and oxygen saturation 96%.  GENERAL:  She was having frequent coughing spells.  LUNGS:  Fair air movement with diffuse coarse crackles with end-  expiratory wheezing and deep breathing provoked cough.  HEART:  Tachycardiac but had regular rhythm with a 3/6 systolic  murmur.  ABDOMEN:  Soft, nontender, and had normal bowel sounds.  NEUROLOGIC:  Nonfocal.   LABORATORY DATA:  Sodium 139, potassium 2.8, chloride 96, bicarb 25, BUN  14, creatinine 0.57, glucose 162.  White blood count 9.9, hemoglobin  16.1, and platelets 349.  Alcohol level was 273.  BNP 30.6.  D-dimer  less than 0.22.  Point-of-care cardiac markers were negative.  Chest x-  ray showed cardiomegaly, but otherwise no acute disease.   HOSPITAL COURSE:  1. Acute bronchitis.  The patient was treated with IV Solu-Medrol as      well as azithromycin and she improved clinically.  She did continue      to have some wheezing, but her breathing was much easier and she      felt less short of breath and ready to go home.  She will be      discharged on doxycycline and a prednisone taper with followup as      described above.  She will need outpatient pulmonary function tests      to determine whether she has COPD or asthma.  2. Hypokalemia.  This was likely due to alcohol use and her potassium      was repleted orally.  Her magnesium was within normal limits.  3.  Alcohol abuse.  She was placed on CIWA protocol for possible      withdrawal, although she had no signs of withdrawal during the      hospitalization.  She was counseled at length regarding the      importance for her long-term health of abstaining completely from      alcohol.  She will follow up with Alcoholics Anonymous.  This will      be addressed during her followup visit.  4. Diabetes.  She did have hyperglycemia, which was likely steroid      induced.  She was continued on her home metformin as well as      additional sliding scale coverage due to the steroids.  She will be      placed on a quick prednisone taper as outlined above.  She will      continue her home dose of metformin since the hyperglycemia is      likely to be transient since it is steroid-induced and the steroids      are being tapered.   DISCHARGE LABORATORIES AND VITALS:  Temperature 98.1, blood pressure  120/77, pulse 82, respirations 18, and oxygen saturation 96% on room  air.  Sodium 134, potassium 4.3, chloride 95, bicarb 26, glucose 306  (note CBG was 194), BUN 14, creatinine 0.65, bilirubin 0.6, alk phos 45,  AST 27, ALT 17, protein 6.9, albumin 3.8, calcium 9.4, magnesium 1.6.  Urine drug screen was negative.      Loel Dubonnet, MD  Electronically Signed      C. Ulyess Mort, M.D.  Electronically Signed    PN/MEDQ  D:  11/03/2008  T:  11/04/2008  Job:  [id number]

## 2011-03-18 NOTE — Discharge Summary (Signed)
NAMEJAKEISHA, STRICKER                 ACCOUNT NO.:  [id number]   MEDICAL RECORD NO.:  [id number]          PATIENT TYPE:  INP   LOCATION:  3006                         FACILITY:  [facility]   PHYSICIAN:  Pramod P. Pearlean Brownie, MD    DATE OF BIRTH:  [birth year]   DATE OF ADMISSION:  09/13/2004  DATE OF DISCHARGE:  09/14/2004                                 DISCHARGE SUMMARY   DISCHARGE DIAGNOSES:  1.  Right brain TIA with transient left sided numbness.  2.  Hypertension.  3.  Diabetes.  4.  Right upper extremity radiating numbness.  5.  Obesity.  6.  History of irregular heart beat.  7.  History of depression with suicidal ideation.  8.  History of degenerative arthritis, mainly involving the fingers.  9.  History of left wrist laceration status post surgery.  10. History of C-section.  11. History of right ankle fracture status post surgery in the past.  12. Glaucoma of the right eye.  13. Status post bilateral cataract surgeries.   DISCHARGE MEDICATIONS:  1.  Aspirin 325 mg daily.  2.  Glucophage 850 mg b.i.d.  3.  Toprol XL 25 mg daily.  4.  Prinvil 40 mg daily.  5.  Lasix 20 mg daily.  6.  Unknown eye drop that she ran out of 2 months ago and has not had.   STUDIES:  1.  CT of the brain on admission shows __________ mucoperiosteal thickening      in the right maxillary sinus, otherwise, no acute intracranial findings.  2.  MRI of the brain was negative for acute infarct.  There is some      nonspecific subcortical white matter changes supratentorially.  Moderate      sinusitis in the ethmoid with a probable mucous retention cyst in the      right sphenoethmoid regions.  3.  MRA of the brain shows no significant stenosis.  4.  MRA of the neck shows no significant stenosis.  5.  A 2D echocardiogram is completed, results pending at the time of      discharge.   LABORATORY STUDIES:  Urine drug screen negative.  UA negative.  Chemistry  with glucose 129 and total protein 5.8, otherwise,  negative.  Hemoglobin A1C  normal.  Cardiac enzymes normal.  Homocysteine pending.  Lipid panel  pending.  CBC normal.  Differential normal.  Coagulation studies normal.   HISTORY OF PRESENT ILLNESS:  Ms. Pond is a 65 year old left-handed black  female.  Has history of diabetes, hypertension, obesity and is followed by  [facility].  She presented to the [facility] Emergency Room for evaluation  of left sided numbness that she noticed around 4:30 a.m. on the day of  admission.  She claims she awakened at night, got up to get a snack, got  some mild and orange juice and went back to bed.  About a half hour later,  she noticed she had some left neck and shoulder pain.  She took and over-the-  counter medication, but that arm continued to be numb, and it was  associated  with some left body and left leg sensation as well.  She was brought to the  emergency room for further evaluation.  CT of the head was negative.  She  denies any prior history of stroke.  She will be admitted to the hospital  for further stroke workup.   HOSPITAL COURSE:  MRI was negative for acute infarct.  Numbness was found to  be related to TIA as it resolved.  Will start the patient on aspirin for  primary stroke prevention and continue other medications.  She does need  tight risk factor control including that of hypertension and diabetes.  She  is to follow up on her echocardiogram, homosysteine and lipids at the time  of discharge.  She already has an appointment set up with Fannie Knee Drinkard on  Friday and we will let her make any blood pressure medication adjustments at  that time.  The patient also complains of right sided numbness and pain  radiating from her neck to her right thigh.  We will make an appointment  with Dr. Pearlean Brownie for her to follow up with that after discharge, recommend one  month.  Office to call with appointment time.   CONDITION ON DISCHARGE:  The patient is alert and oriented x3.  Appears to   be __________.  No facial weakness.  Strength is normal.  She has no drift.  She has no ataxia.  She has good balance and gait.  Chest clear to  auscultation.  Heart regular rate.  She does complain of some numbness in  her right thumb radiating from her neck.   DISCHARGE PLAN:  1.  Discharge home.  2.  Aspirin for primary stroke prevention.  3.  Tight risk factor control.  4.  Follow up with [facility] on Friday.  5.  Goal blood pressure 130/80 or lower.  6.  [facility] to follow up with lipid profile, homosysteine and 2D      echocardiogram.  If homosysteine elevated, need to start her on Foltx 1      daily.  7.  Follow up with Dr. Pearlean Brownie in one month for right arm pain.       SB/MEDQ  D:  09/14/2004  T:  09/14/2004  Job:  [id number]   cc:   Fannie Knee Drinkard  [facility]

## 2011-03-18 NOTE — Discharge Summary (Signed)
[place]  Patient:    YURIDIA, COUTS Visit Number: [id number] MRN: [id number]          Service Type: MED Location: 2000 2002 01 Attending Physician:  Alfonso Ramus Dictated by:   Ladell Pier, M.D. Admit Date:  05/21/2001 Discharge Date: 05/23/2001                             Discharge Summary  DISCHARGE DIAGNOSES: 1. Chest pain. 2. Lightheadedness. 3. Hypertension.  CONSULTATION:  Cardiology.  PROCEDURES:  None.  HISTORY OF PRESENT ILLNESS:  This is a 65 year old, African-American female with past medical history significant for hypertension, diabetes and alcohol abuse.  The patient stated that the night prior to her admission, she developed a headache and it felt like pressure inside her head.  She started getting nervous and had feelings as if her whole body was getting numb.  She also had a decreased sensation all over.  The patient stated that she normally gets this feeling with hypoglycemia.  She thought she was hypoglycemic so she ate a piece of raw garlic for her hypertension, that she normally does, and then checked her blood sugar.  Her blood sugar was 86.  She became nauseous and started vomiting.  Her left leg started feeling light.  She felt dizzy and so she came into the emergency department.  In the past three days before this incident, she drunk 24 beers, one pint of brandy, three to four shots of [place] liquor.  She normally drinks on the weekends, but not this much.  SHe had no slurred speech, no vision changes, no diaphoresis with no shortness of breath.  She has no history of coronary artery disease.  PAST MEDICAL HISTORY: 1. Hypertension since she was 65 years old. 2. Diabetes type 2 diagnosed in June 2002. 3. 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 4. Ethanol abuse since 65 years old. 5. C-section x 2. 6. Right ankle fracture. 7. Right wrist laceration.  FAMILY HISTORY:  Her father died at 7  years old secondary to lung cancer. Her mother died at 4 secondary to myocardial infarction.  Her sister died at 54 years old from diabetes.  She has a brother with kidney failure due to hypertension.  Brother also use to be an alcoholic.  SOCIAL HISTORY:  She smokes one pack per day since she was 65 years old.  She drinks about one pint per day since she was 65 years old.  No IV drug use. She lives alone.  Her two sons are incarcerated at present.  She has six brothers and three sisters.  MEDICATIONS: 1. Lotensin 20 mg q.d. 2. HCTZ 12.5 mg q.d. 3. Metformin 850 mg b.i.d.  ALLERGY:  PENICILLIN.  REVIEW OF SYSTEMS:  Weight changes.  Decrease of 25 pounds in one month with no fevers or chills.  HEENT: Positive for headaches, nausea and vomiting. Respiratory: No shortness of breath and no wheezing or cough.  Cardiovascular: Two-pillow orthopnea with PND, no palpitations.  Positive for hypertension. GI: No indigestion, no abdominal pain.  GU: Nocturia x 5-7 times.  Neurologic: Numbness in her whole body.  Endocrine: No heat or cold intolerance.  PHYSICAL EXAMINATION:  VITAL SIGNS:  Temperature 97.2, blood pressure 201/127, pulse 99, respiratory rate 18, pulse oximetry 99% on room air.  GENERAL:  Well-dressed, well-nourished, African-American female in no apparent distress.  HEENT:  Head normocephalic, atraumatic.  Right pupil  was traumatic and left was 2+.  Throat with no erythema.  CARDIAC:  Regular rate and rhythm, S1, S2 constant.  A 2/6 systolic ejection murmur.  PMI not palpable.  LUNGS:  Clear to auscultation bilaterally.  No wheezes, rhonchi or crackles.  ABDOMEN:  Soft, nontender with positive bowel sounds.  EXTREMITIES:  No edema, 2+ DP pulse bilaterally.  LYMPHS:  No lymphadenopathy.  NEUROLOGIC:  Cranial nerves II-XII intact.  Strength 5/5 throughout extremities.  Reflexes 2+ in patellar.  Toes were downgoing.  LABORATORY DATA AND X-RAY FINDINGS:  EKG showed  normal sinus rhythm with rate 78 beats per minute.  New T wave inversion in leads II, III, aVF plus V1-V6. Head CT showed no acute intracranial abnormality.  Her blood gas showed pH 7.402, pCO2 49, bicarb 31, pCO2 32.  Base excess 5.0.  Chest x-ray showed no acute disease.  CBC with WBC 6.0, hemoglobin 15, hematocrit 44, platelets 275. Coagulations with PT 12.1, PTT 33.0, INR 0.1.  Basic metabolic panel with sodium 139, potassium 4.0, chloride 102, BUN 12, creatinine 0.9, glucose 87. Cardiac enzymes with CK 148, CK-MB 1.9, relative index 1.3, troponin I 0.03.  HOSPITAL COURSE:  #1 - EKG CHANGES, RULE OUT MYOCARDIAL INFARCTION: Cardiology consult was obtained because of the patients risk factors in that positive family history of hypertension, diabetes and that she smokes. Cardiology suggested getting a Cardiolite stress test.  This showed no ischemia with an EF of 51%.  Therefore, the patient had negative enzymes.  EKG changes were pretty diffuse and Cardiolite stress test was normal.  Therefore, the patient did not have a myocardial infarction event.  #2 - HYPERTENSION:  The patient was treated for her hypertension.  She was continued on her Lotensin and hydrochlorothiazide and Lopressor was added b.i.d. for better control.  The patients blood pressure was stable on her discharge.  Blood pressure decreased to 118/72.  #3 - HEADACHES:  Her headaches were most likely due to her alcohol consumption and her elevated blood pressure.  Her head CT was negative which rules out the possibility of an intracranial bleed.  Her headache resolved during her hospital course.  #4 - DIABETES:  She was continued on her home medication and blood sugar remained fairly stable during her hospital course.  #5 - ETHANOL ABUSE:  The patient stated that she feels that most of her problems were due to her alcohol abuse and that her alcohol abuse was from the stress of having her two sons incarcerated.  Social  work consult was done and  social work spoke with her and gave her information on different options of alcoholic anonymous or other groups with counseling sessions that could help with her substance abuse.  CONDITION ON DISCHARGE:  Stable.  DISPOSITION:  Discharged to home.  DISCHARGE MEDICATIONS: 1. Lotensin 20 mg daily. 2. HCTZ 12.5 mg daily. 3. Metformin 850 mg twice daily.  FOLLOWUP:  The patient was told to follow up with [facility] and her primary care within one week. Dictated by:   Ladell Pier, M.D. Attending Physician:  Alfonso Ramus DD:  06/29/01 TD:  06/29/01 Job: [id number]

## 2011-03-18 NOTE — Discharge Summary (Signed)
Allison Caldwell, Allison Caldwell                 ACCOUNT NO.:  [id number]   MEDICAL RECORD NO.:  [id number]          PATIENT TYPE:  INP   LOCATION:  3712                         FACILITY:  [facility]   PHYSICIAN:  Isidor Holts, M.D.  DATE OF BIRTH:  Apr 19, 1946   DATE OF ADMISSION:  08/01/2006  DATE OF DISCHARGE:  08/05/2006                                 DISCHARGE SUMMARY   PMD:  Unassigned.   DISCHARGE DIAGNOSES:  1. Severe hypertension/hypertensive urgency.  2. Type 2 diabetes mellitus.  3. Moderate obesity.  4. Previous history of transient ischemic attack, November, 2007.  5. Urinary tract infection.  6. History of depression.   DISCHARGE MEDICATIONS:  1. Aspirin 81 mg p.o. daily (over-the-counter).  2. Clonidine 0.1 mg p.o. t.i.d.  3. Lisinopril-HCTZ (20/25) one p.o. daily.  4. Metformin 500 mg p.o. daily.  5. Metoprolol 50 mg p.o. b.i.d.  6. Klor-Con 20 mEq p.o. daily.  7. Ciprofloxacin 500 mg p.o. b.i.d. for 2 days only, from May 09, 2006.   PROCEDURES:  1. Chest x-ray, two-view, dated August 01, 2006.  This showed stable      cardiac enlargement and tortuous ectatic thoracic aorta, no acute      pulmonary findings.  2. Head CT scan dated August 01, 2006.  This showed no acute findings.  3. Two-D echocardiogram dated August 02, 2006, that showed hypokinesis of      the apical aspect of the inferior septum and hypokinesis of the mid      apical inferior wall.  Ejection fraction is estimated to be 55%, left      ventricular wall thickness was mildly increased and was trivial aortic      valvular regurgitation, left atrium was dilated.   CONSULTATIONS:  None.   ADMISSION HISTORY:  In the H&P notes of August 01, 2006, dictated by Dr.  Elliot Cousin.  However, in brief, this is a 65 year old female with known  history of hypertension, type 2 diabetes mellitus, prior right-sided TIA  November, 2005, obesity, glaucoma and DJD who presented nonspecifically  unwell, after being out of  her medications for approximately 5 months.  In  addition, she had headache, paresthesia in hands and toes, dyspnea,  lightheadedness, occasional chest pain and shortness of breath and more  recently dysuria/urinary frequency.  On initial evaluation in the emergency  department, she was found to be markedly hypertensive, with a blood pressure  of 235/151.  She was admitted for further evaluation, investigation and  management.   CLINICAL COURSE:  1. Severe uncontrolled hypertension/hypertensive urgency.  Patient      presents with nonspecific symptoms, associated with paresthesia,      dizziness, shortness of breath and was found to have markedly elevated      blood pressure of the order of 235/151.  A 12 lead EKG shows no acute      ischemic abnormalities.  Head CT scan also showed no evidence of acute      intracranial findings.  Physical examination did not reveal any focal      neurology.  She was managed as  a case of hypertensive urgency with a      combination of ACE inhibitor, beta-blocker, HCTZ and clonidine, with      satisfactory clinical effect.  By August 05, 2006, patient's blood      pressure was reasonably controlled at 142/80, she was totally      asymptomatic and was ambulant.   1. Type 2 diabetes mellitus.  This was controlled with carbohydrate      modified diet and metformin and patient remained euglycemic throughout      the hospital course.   1. Urinary tract infection.  Patient presented with urinary frequency and      dysuria.  Urinalysis showed positive urinary sediment, consistent with      urinary tract infection.  She was managed with a 7 day course of      ciprofloxacin, to be completed on August 07, 2006.   1. Prior history of transient ischemic attack.  No focal neurologic      deficits were observed, during the course of patient's hospitalization.   1. History of depression.  Patient's mood remained stable throughout this      hospitalization.    DISPOSITION:  1. Patient was considered sufficiently recovered and clinically stable to      be discharged on August 05, 2006.  She may return to regular duties on      August 07, 2006.   1. Diet.  Healthy heart/carbohydrate modified.   1. Activity.  As tolerated.   1. Wound care.  Not applicable.   1. Pain management.  Not applicable.   FOLLOW UP INSTRUCTIONS:  Patient has assured me that her paperwork has been  transferred from [facility] to the medical outpatient department of [facility], and that she is scheduled to have an appointment there within 1  week.  I strongly urged her to keep this appointment.      Isidor Holts, M.D.  Electronically Signed     CO/MEDQ  D:  08/05/2006  T:  08/06/2006  Job:  [id number]

## 2011-03-18 NOTE — H&P (Signed)
[name], Allison Caldwell                 ACCOUNT NO.:  [id number]   MEDICAL RECORD NO.:  [id number]          PATIENT TYPE:  INP   LOCATION:  1828                         FACILITY:  [facility]   PHYSICIAN:  Elliot Cousin, M.D.    DATE OF BIRTH:  [birth year]   DATE OF ADMISSION:  08/01/2006  DATE OF DISCHARGE:                                HISTORY & PHYSICAL   PRIMARY CARE PHYSICIAN:  The patient is unassigned.   CHIEF COMPLAINT:  I'm feeling sick.   HISTORY OF PRESENT ILLNESS:  The patient is a 65 year old lady with a past  medical history significant for hypertension, type 2 diabetes mellitus, and  a right brain TIA in November 2005 who presents to the emergency department  with a chief complaint of general ill feeling.  The patient says that she  has been out of her medications for approximately five months.  She was  discharged from the [facility] because she allegedly provided the  clinic with false information.  The patient actually  presented to the  [facility] today and was turned away.  She therefore came to the  emergency department at [facility].  Over the past few months, she  has not been feeling well.  Specifically over the past week or two, she  complains of headache, itching all over, numbness and tingling in her hands  and toes, dizziness/lightheadedness, intermittent headache, occasional chest  pain, shortness of breath, loose stools and more recently pain and pressure  with urination.  The patient denies focal weakness, difficulty speaking and  difficulty swallowing.  She denies fevers and chills.  She denies abdominal  pain and vomiting, although she has had intermittent nausea.  She denies  swelling in her legs.  She denies significant weight gain or weight loss.  She has not had any associated cardiac symptoms with the chest pain which  occurs once or twice a week in the substernal and epigastric region.  She is  diabetic; however, she has not been  checking her blood sugars.  She has  asked some of her family members for financial assistance; however, they  have been unable to assist her.   During the evaluation in the emergency department, the patient is noted to  be quite hypertensive with a blood pressure of 235/151.  Her EKG reveals  normal sinus rhythm with a heart rate of 87 beats per minute, left anterior  fascicular block, ST and T-wave abnormalities, and a prolonged QT interval.  The CT scan of her head reveals no acute intracranial findings.  The chest x-  ray reveals no acute cardiopulmonary disease.  The patient will be admitted  for further evaluation and management.   PAST MEDICAL HISTORY:  1. Hypertension.  2. Type 2 diabetes mellitus.  3. Right brain TIA with transient left-sided numbness in November 2005.  4. Obesity.  5. History of an irregular heart beat.  6. Admission in July of 2002 secondary to chest pain.  Myocardial      infarction was ruled out.  7. Glaucoma of the right eye and status post  bilateral cataract surgeries.  8. Degenerative joint disease.  9. Status post right ankle surgery.  10.Status post left wrist surgery.  11.Status post C-section.  12.History of depression with suicidal ideation in the past.  13.Hemorrhoids.  14.Noncompliance.   MEDICATIONS:  None.  However, based on the last discharge summary from  November 2005, she was prescribed aspirin 325 mg daily, Glucophage 850 mg  b.i.d., Toprol XL 25 mg daily, Prinivil 40 mg daily, Lasix 20 mg daily, and  glaucoma ophthalmic drops.   ALLERGIES:  No known drug allergies.   SOCIAL HISTORY:  The patient is divorced.  She lives in [place], [place]  [place] alone.  She lives in an apartment.  She has two children-both sons  are in prison.  She is retired.  She is applying for disability as well.  She smokes one to two cigarettes per week.  She drinks wine and beer on the  weekend.  She smokes marijuana occasionally.  She does not drive.   She can  read and write.   FAMILY HISTORY:  Her father died of lung cancer.  Her mother died of a heart  attack.   REVIEW OF SYSTEMS:  The patient's review of systems is positive for  depression but no suicidal ideation.  Chronic loose stools.  Occasional  bright red blood per rectum and hemorrhoids.  Occasional numbness and  tingling in her feet.  Otherwise review of systems is negative.   PHYSICAL EXAMINATION:  Temperature 99.4, blood pressure 235/151 (repeated at  161/106), pulse 88, respiratory rate 18, oxygen saturation 98% on room air.  GENERAL:  The patient is an overweight 65 year old African American woman  who is currently lying in bed in no acute distress.  HEENT:  Head is normocephalic and atraumatic.  The right pupil is fixed and  dilated and not reactive to light. The left pupil is round and reactive to  light.  Extraocular movements intact.  Conjunctivae are clear.  Sclerae are  white.  Tympanic membranes are mildly obscured by cerumen bilaterally.  Nasal mucosa is moist.  No sinus tenderness.  Oropharynx reveals multiple  missing teeth.  Fair dentition.  Mucus membranes are moist.  No posterior  exudates or erythema.  Neck is supple.  No adenopathy, no thyromegaly, no  bruit, no JVD.  LUNGS:  Clear to auscultation bilaterally.  HEART:  S1-S2 with a soft systolic murmur.  ABDOMEN:  Obese, positive bowel sounds, soft, nontender, and nondistended.  No hepatosplenomegaly.  No masses palpated.  GU/RECTAL:  Deferred.  EXTREMITIES:  Pedal pulses are 2+ bilaterally.  No pretibial edema and no  pedal edema.  NEUROLOGICAL:  The patient is alert and oriented x3.  Cranial nerves II  through XII are intact.  Sensation is intact throughout.  Cerebellar with  finger-to-nose is intact.   ADMISSION LABORATORY DATA:  Sodium 140, potassium 4.0, chloride 107, bicarb 29, BUN 5, creatinine 0.6.  Hemoglobin 12.6, hematocrit 37, myoglobin 41.9.  CK-MB less than 1.0.  Troponin I less than  0.05.  Urinalysis is positive for  moderate leukocyte esterase, 11 to 20 WBCs and many bacteria.   ASSESSMENT:  1. Hypertensive urgency:  The patient's blood pressure on arrival to the      emergency department was 235/151.  This is obviously secondary to      noncompliance with her antihypertensive medications.  2. Sign/symptom complex including paresthesias in her hands and feet,      dysequilibrium/dizziness, headache, and shortness of breath:  Surprisingly, the patient has no complaints of chest pain.  The      patient's signs and symptoms may all be secondary to hypertensive      urgency and noncompliance with medication therapy.  3. Electrocardiogram changes consistent with left anterior fascicular      block, prolonged QT interval and nonspecific ST and T-wave      abnormalities.  These manifestations may be the consequence of      hypertensive urgency.  4. Type 2 diabetes mellitus:  The patient's venous glucose is 131.  Given      her history of noncompliance,  the venous glucose was expected to be      much higher.  5. Urinary tract infection.   PLAN:  1. The patient will be admitted for further evaluation and management.  2. The patient was started on a nitroglycerin drip by the emergency      department physician.  We will continue the nitroglycerin drip for now.      We will add the following generic antihypertensive medications:      Metoprolol, lisinopril and hydrochlorothiazide.  We will eventually      wean off the nitroglycerin drip over the next 24 hours.  We will try to      keep the patient's systolic blood pressure in a range of 180-150 to      avoid a dramatic drop in cerebral perfusion, renal perfusion and      coronary artery perfusion.  3. We will start metformin and glipizide as well as a sliding scale      insulin regimen for diabetes control.  Will hold glipizide for      capillary blood sugar less than 125.  4. We will start Cipro 500 mg b.i.d.  An  urine culture has been ordered.  5. We will assess the patient further by checking cardiac enzymes q.8h.      x3, BNP, TSH, fasting lipid panel, CBC, homocystine level, hemoglobin      A1c, vitamin B12 and folate level.  We will also check a magnesium and      phosphorus level.  6. Consult the case manager for further assistance.      Elliot Cousin, M.D.  Electronically Signed     DF/MEDQ  D:  08/01/2006  T:  08/02/2006  Job:  [id number]

## 2011-03-18 NOTE — H&P (Signed)
NAME:  NOORA, LOCASCIO NO.:  [id number]   MEDICAL RECORD NO.:  [id number]          PATIENT TYPE:  EMS   LOCATION:  MAJO                         FACILITY:  [facility]   PHYSICIAN:  Marlan Palau, M.D.  DATE OF BIRTH:  11-Nov-1945   DATE OF ADMISSION:  09/13/2004  DATE OF DISCHARGE:                                HISTORY & PHYSICAL   HISTORY OF PRESENT ILLNESS:  [facility] is a 65 year old left-handed black  female born 21-Sep-1946, with a history of diabetes and hypertension,  who is followed through [facility].  This patient presented to [facility]  emergency room for evaluation of some left-sided numbness that she began  noticing at about 4:30 a.m. on the day of admission.  The patient claims  that she awakened at night, got up to get a snack, got some milk and some  orange juice in the kitchen.  The patient went back to bed and about half an  hour later noted that she had some left neck and shoulder pain.  The patient  took an over-the-counter medication for this but later noted that her left  arm began to be numb, possibly associated with left body and left leg  altered sensation as well.  The patient noticed the onset of these symptoms  around 5 a.m. on the day of admission. The patient was brought to the  emergency room for further evaluation.  The patient denies any headache,  denies any visual field changes, speech changes, weakness of the left side.  The patient feels that since the onset of the deficit, the symptoms have  improved.  CT of the head was done through the emergency room and was  unremarkable.  The patient denies any prior history of stroke.  The patient  denies chest pain, palpitations of the heart, but does give a history of an  irregular heart beat.   PAST MEDICAL HISTORY:  1.  History of new-onset left-sided numbness.  2.  Diabetes.  3.  Hypertension.  4.  Obesity.  5.  Bilateral cataract surgeries.  6.  Glaucoma of the right eye.  7.  Right ankle fracture with surgery in the past.  8.  History of C-section.  9.  History of left wrist laceration requiring surgery.  10. History of degenerative arthritis mainly involving the fingers.  11. History of depression with suicidal ideation in the past.  12. History of irregular heart beat.   MEDICATIONS:  1.  An eye drop she cannot remember the name of.  The patient has been off      the eye drop for the right eye for two months, as she ran out of the      medication.  2.  Glucophage 850 mg b.i.d.  3.  Toprol XL 25 mg daily.  4.  Prinivil 20 mg two in the morning.  5.  Lasix 20 mg daily.   The patient has no known allergies.   Smokes a pack of cigarettes a week on average.  Had a history of binge  drinking on weekends in the  past.   SOCIAL HISTORY:  Notable for that the patient lives in the [place] area,  is separated for a number of years.  Has two children, both are incarcerated  at this point.  She does not know the health history.  The patient works as  a Advertising copywriter.   FAMILY MEDICAL HISTORY:  Notable that mother died with an MI, father died  with cancer.  The patient has six brothers, three sisters.  One died with  diabetes complications, one died with renal failure, one died with an MI.   REVIEW OF SYSTEMS:  Notable for no recent fevers or chills.  The patient  denies headache, denies history of neck pain other than what was mentioned  above.  Denies shortness of breath, chest pain, nausea or vomiting.  Denies  any problems controlling the bowel or bladder.  Denies any balance problems  or blackout episodes.  The patient does report some arthritis in the  fingers.   PHYSICAL EXAMINATION:  VITAL SIGNS:  Blood pressure is 162/97, heart rate  62, respiratory rate 22, temperature is 99.2, initial temperature was 100.4.  GENERAL:  This patient is a moderately obese black female who is alert and  cooperative at the time of the examination.  HEENT:  Head is  atraumatic.  Eyes:  Pupils are postsurgical, slightly  eccentric on the right.  Discs are flat bilaterally.  NECK:  Supple, no carotid bruits noted.  RESPIRATORY:  Clear.  CARDIOVASCULAR:  A regular rate and rhythm, no obvious murmurs or rubs  noted.  ABDOMEN:  Obese, positive bowel sounds noted, no organomegaly or tenderness  noted.  EXTREMITIES:  Without significant edema.  NEUROLOGIC:  Cranial nerves as above.  Facial symmetry is present.  The  patient notes good pinprick sensation on the face bilaterally.  There is  good strength of the facial muscles and the muscles of head turning and  shoulder shrug bilaterally.  Speech is well-enunciated and not aphasic.  Extraocular movements are relatively full and visual fields full to double  simultaneous stimulation.  Motor testing reveals 5/5 strength in all fours.  Good symmetric motor tone and strength throughout.  Sensory testing is  intact to pinprick, soft touch, vibratory sensation throughout.  The patient  has good finger-nose-finger, toe-to-finger bilaterally.  The patient was not  ambulated.  Deep tendon reflexes were depressed but symmetric.  The patient  does have stocking-glove pinprick sensory deficit to just below the knees  bilaterally.  Again, reflexes were symmetric and depressed.  Toes neutral.   LABORATORY DATA:  Notable for a white count of 5.6, hemoglobin 14.1,  hematocrit 42.2, MCV of 88.9, platelets of 230.  Sodium 138, potassium 3.7,  chloride of 105, BUN of 11, glucose of 111.  Hemoglobin again is 15.  Creatinine 0.6.  Coagulation studies were pending.  CT of the head is as  above.  EKG reveals normal sinus rhythm with left axis deviation,  nonspecific ST and T-wave abnormalities.  Heart rate is 77.   IMPRESSION:  1.  New onset of left-sided hemisensory deficits, mainly transient, rule out      transient ischemic attack versus stroke.  2.  Diabetes.  3.  Hypertension.  The patient does have risk factors for  stroke.  Will admit this patient  overnight for an evaluation for a small subcortical stroke event.  The  patient was not on any aspirin prior to this admission.  Clinically, the  patient has no deficits at this point.  PLAN:  1.  Admission to [facility].  2.  MRI of the brain.  3.  MRI angiogram of the intracranial and extracranial vessels.  4.  Check cardiac enzymes.  5.  Aspirin therapy.  6.  IV fluid hydration.  7.  Will follow the patient's clinical course while in house.       CKW/MEDQ  D:  09/13/2004  T:  09/13/2004  Job:  [id number]   cc:   Dala Dock

## 2011-03-18 NOTE — Discharge Summary (Signed)
[place]  Patient:    [facility], Allison Caldwell                          MRN: [id number] Adm. Date:  [id number] Disc. Date: [id number] Attending:  Alfonso Ramus Dictator:   Ladell Pier, M.D.                           Discharge Summary  REASON FOR ADMISSION:  The patient presented with chief complaint of headache and feeling light pressure in her head.  She was admitted because of nonspecific T-wave changes on her EKG and because of her risk factors.  She has hypertension, diabetes, and she is a smoker.  So, she was admitted to rule out myocardial infarction.  SIGNIFICANT FINDINGS:  During her admission course, she had a Cardiolite stress test done which showed no ischemia and an ejection fraction of 51%.  OPERATION:  None.  FINAL DIAGNOSIS:  The patient was ruled out for acute myocardial infarction.  SECONDARY DIAGNOSES:  1. Hypertension.  2. Diabetes, type 2.  3. Ethanol abuse.  CONSULTATIONS:  Cardiology.  CONDITION ON DISCHARGE:  Stable.  DISPOSITION:  The patient was discharged to home. DD:  06/04/01 TD:  06/05/01 Job: [id number]

## 2011-03-22 ENCOUNTER — Ambulatory Visit: Payer: Medicaid Other | Admitting: Internal Medicine

## 2011-03-26 ENCOUNTER — Emergency Department (HOSPITAL_COMMUNITY)
Admission: EM | Admit: 2011-03-26 | Discharge: 2011-03-27 | Disposition: A | Discharge status: Home / Self Care | Payer: Medicare Other | Attending: Emergency Medicine | Admitting: Emergency Medicine

## 2011-03-26 ENCOUNTER — Emergency Department (HOSPITAL_COMMUNITY): Payer: Medicare Other

## 2011-03-26 DIAGNOSIS — Y92009 Unspecified place in unspecified non-institutional (private) residence as the place of occurrence of the external cause: Secondary | ICD-10-CM | POA: Insufficient documentation

## 2011-03-26 DIAGNOSIS — E119 Type 2 diabetes mellitus without complications: Secondary | ICD-10-CM | POA: Insufficient documentation

## 2011-03-26 DIAGNOSIS — I10 Essential (primary) hypertension: Secondary | ICD-10-CM | POA: Insufficient documentation

## 2011-03-26 DIAGNOSIS — W57XXXA Bitten or stung by nonvenomous insect and other nonvenomous arthropods, initial encounter: Secondary | ICD-10-CM | POA: Insufficient documentation

## 2011-03-26 DIAGNOSIS — T148 Other injury of unspecified body region: Secondary | ICD-10-CM | POA: Insufficient documentation

## 2011-03-26 DIAGNOSIS — F1010 Alcohol abuse, uncomplicated: Secondary | ICD-10-CM | POA: Insufficient documentation

## 2011-03-26 DIAGNOSIS — E7800 Pure hypercholesterolemia, unspecified: Secondary | ICD-10-CM | POA: Insufficient documentation

## 2011-03-26 DIAGNOSIS — S0990XA Unspecified injury of head, initial encounter: Secondary | ICD-10-CM | POA: Insufficient documentation

## 2011-03-26 DIAGNOSIS — W07XXXA Fall from chair, initial encounter: Secondary | ICD-10-CM | POA: Insufficient documentation

## 2011-03-26 LAB — ETHANOL: Alcohol, Ethyl (B): 276 mg/dL — ABNORMAL HIGH (ref 0–10)

## 2011-03-29 ENCOUNTER — Ambulatory Visit: Payer: Medicaid Other | Admitting: Internal Medicine

## 2011-03-29 ENCOUNTER — Other Ambulatory Visit: Payer: Self-pay | Admitting: Internal Medicine

## 2011-04-28 ENCOUNTER — Other Ambulatory Visit: Payer: Self-pay | Admitting: Internal Medicine

## 2011-04-28 NOTE — Telephone Encounter (Signed)
Alprazolam rx called to [facility] pharmacy. 

## 2011-05-29 ENCOUNTER — Emergency Department (HOSPITAL_COMMUNITY): Payer: Medicare Other

## 2011-05-29 ENCOUNTER — Emergency Department (HOSPITAL_COMMUNITY)
Admission: EM | Admit: 2011-05-29 | Discharge: 2011-05-30 | Disposition: A | Discharge status: Home / Self Care | Payer: Medicare Other | Attending: Emergency Medicine | Admitting: Emergency Medicine

## 2011-05-29 DIAGNOSIS — M25519 Pain in unspecified shoulder: Secondary | ICD-10-CM | POA: Insufficient documentation

## 2011-05-29 DIAGNOSIS — W19XXXA Unspecified fall, initial encounter: Secondary | ICD-10-CM | POA: Insufficient documentation

## 2011-05-29 DIAGNOSIS — E7800 Pure hypercholesterolemia, unspecified: Secondary | ICD-10-CM | POA: Insufficient documentation

## 2011-05-29 DIAGNOSIS — Z79899 Other long term (current) drug therapy: Secondary | ICD-10-CM | POA: Insufficient documentation

## 2011-05-29 DIAGNOSIS — M542 Cervicalgia: Secondary | ICD-10-CM | POA: Insufficient documentation

## 2011-05-29 DIAGNOSIS — R4789 Other speech disturbances: Secondary | ICD-10-CM | POA: Insufficient documentation

## 2011-05-29 DIAGNOSIS — E119 Type 2 diabetes mellitus without complications: Secondary | ICD-10-CM | POA: Insufficient documentation

## 2011-05-29 DIAGNOSIS — R079 Chest pain, unspecified: Secondary | ICD-10-CM | POA: Insufficient documentation

## 2011-05-29 DIAGNOSIS — F1010 Alcohol abuse, uncomplicated: Secondary | ICD-10-CM | POA: Insufficient documentation

## 2011-05-29 DIAGNOSIS — Z7982 Long term (current) use of aspirin: Secondary | ICD-10-CM | POA: Insufficient documentation

## 2011-05-29 DIAGNOSIS — I10 Essential (primary) hypertension: Secondary | ICD-10-CM | POA: Insufficient documentation

## 2011-05-29 DIAGNOSIS — Y92009 Unspecified place in unspecified non-institutional (private) residence as the place of occurrence of the external cause: Secondary | ICD-10-CM | POA: Insufficient documentation

## 2011-05-29 LAB — CBC
HCT: 42.5 % (ref 36.0–46.0)
Hemoglobin: 15.2 g/dL — ABNORMAL HIGH (ref 12.0–15.0)
MCH: 30.4 pg (ref 26.0–34.0)
MCHC: 35.8 g/dL (ref 30.0–36.0)
MCV: 85 fL (ref 78.0–100.0)
Platelets: 251 10*3/uL (ref 150–400)
RBC: 5 MIL/uL (ref 3.87–5.11)
RDW: 14.8 % (ref 11.5–15.5)
WBC: 8.6 10*3/uL (ref 4.0–10.5)

## 2011-05-29 LAB — BASIC METABOLIC PANEL
BUN: 7 mg/dL (ref 6–23)
CO2: 25 mEq/L (ref 19–32)
Calcium: 9.2 mg/dL (ref 8.4–10.5)
Chloride: 96 mEq/L (ref 96–112)
Creatinine, Ser: <0.47 mg/dL — ABNORMAL LOW (ref 0.50–1.10)
Glucose, Bld: 115 mg/dL — ABNORMAL HIGH (ref 70–99)
Potassium: 3 mEq/L — ABNORMAL LOW (ref 3.5–5.1)
Sodium: 136 mEq/L (ref 135–145)

## 2011-05-29 LAB — DIFFERENTIAL
Basophils Absolute: 0 10*3/uL (ref 0.0–0.1)
Basophils Relative: 0 % (ref 0–1)
Eosinophils Absolute: 0.1 10*3/uL (ref 0.0–0.7)
Eosinophils Relative: 2 % (ref 0–5)
Lymphocytes Relative: 47 % — ABNORMAL HIGH (ref 12–46)
Lymphs Abs: 4 10*3/uL (ref 0.7–4.0)
Monocytes Absolute: 0.4 10*3/uL (ref 0.1–1.0)
Monocytes Relative: 5 % (ref 3–12)
Neutro Abs: 4 10*3/uL (ref 1.7–7.7)
Neutrophils Relative %: 46 % (ref 43–77)

## 2011-05-29 LAB — GLUCOSE, CAPILLARY: Glucose-Capillary: 110 mg/dL — ABNORMAL HIGH (ref 70–99)

## 2011-05-29 LAB — CK TOTAL AND CKMB (NOT AT ARMC)
CK, MB: 2.6 ng/mL (ref 0.3–4.0)
Relative Index: 1.4 (ref 0.0–2.5)
Total CK: 181 U/L — ABNORMAL HIGH (ref 7–177)

## 2011-05-29 LAB — ETHANOL: Alcohol, Ethyl (B): 300 mg/dL — ABNORMAL HIGH (ref 0–11)

## 2011-05-29 LAB — TROPONIN I: Troponin I: <0.3 ng/mL (ref <0.30)

## 2011-05-30 ENCOUNTER — Other Ambulatory Visit: Payer: Self-pay | Admitting: Internal Medicine

## 2011-05-30 LAB — GLUCOSE, CAPILLARY: Glucose-Capillary: 92 mg/dL (ref 70–99)

## 2011-06-29 ENCOUNTER — Ambulatory Visit (INDEPENDENT_AMBULATORY_CARE_PROVIDER_SITE_OTHER): Payer: Medicare Other | Admitting: Internal Medicine

## 2011-06-29 ENCOUNTER — Encounter: Payer: Self-pay | Admitting: Internal Medicine

## 2011-06-29 DIAGNOSIS — M25512 Pain in left shoulder: Secondary | ICD-10-CM

## 2011-06-29 DIAGNOSIS — E876 Hypokalemia: Secondary | ICD-10-CM

## 2011-06-29 DIAGNOSIS — I10 Essential (primary) hypertension: Secondary | ICD-10-CM

## 2011-06-29 DIAGNOSIS — M25519 Pain in unspecified shoulder: Secondary | ICD-10-CM

## 2011-06-29 DIAGNOSIS — E119 Type 2 diabetes mellitus without complications: Secondary | ICD-10-CM

## 2011-06-29 DIAGNOSIS — F1010 Alcohol abuse, uncomplicated: Secondary | ICD-10-CM

## 2011-06-29 DIAGNOSIS — K219 Gastro-esophageal reflux disease without esophagitis: Secondary | ICD-10-CM

## 2011-06-29 DIAGNOSIS — L299 Pruritus, unspecified: Secondary | ICD-10-CM

## 2011-06-29 DIAGNOSIS — E785 Hyperlipidemia, unspecified: Secondary | ICD-10-CM

## 2011-06-29 DIAGNOSIS — G479 Sleep disorder, unspecified: Secondary | ICD-10-CM

## 2011-06-29 LAB — POCT GLYCOSYLATED HEMOGLOBIN (HGB A1C): Hemoglobin A1C: 6.2

## 2011-06-29 LAB — GLUCOSE, CAPILLARY: Glucose-Capillary: 119 mg/dL — ABNORMAL HIGH (ref 70–99)

## 2011-06-29 MED ORDER — AMLODIPINE BESYLATE 5 MG PO TABS: 5.0000 mg | ORAL_TABLET | Freq: Every day | ORAL | Status: DC

## 2011-06-29 MED ORDER — ZOLPIDEM TARTRATE 5 MG PO TABS: 5.0000 mg | ORAL_TABLET | Freq: Every evening | ORAL | Status: DC | PRN

## 2011-06-29 MED ORDER — DIPHENHYDRAMINE HCL 50 MG PO TABS: 25.0000 mg | ORAL_TABLET | Freq: Every evening | ORAL | Status: AC | PRN

## 2011-06-29 MED ORDER — OMEPRAZOLE 20 MG PO CPDR: 20.0000 mg | DELAYED_RELEASE_CAPSULE | Freq: Two times a day (BID) | ORAL | Status: DC

## 2011-06-29 MED ORDER — SIMVASTATIN 20 MG PO TABS: 20.0000 mg | ORAL_TABLET | Freq: Every day | ORAL | Status: DC

## 2011-06-29 MED ORDER — METFORMIN HCL 500 MG PO TABS: 500.0000 mg | ORAL_TABLET | Freq: Two times a day (BID) | ORAL | Status: DC

## 2011-06-29 NOTE — Progress Notes (Signed)
  Subjective:    Patient ID: [facility], female    DOB: 1946/09/09, 65 y.o.   MRN: [id number]  HPI:65 y/o woman with PMH significant for hypertension, hyperlipidemia , alcohol abuse comes to the clinic for a follow up visit.  She reports some left shoulder pain for last 1 month-  when she fell  under alcohol intoxication. Nothing makes it better or worse. She was also seen in the ER at that time (07/12) and  had imaging that ruled out any acute fracture or bony abnormality. During that visit her potassium levels were also low and was requesting to get it rechecked today.  She also complains of itching of all over her body for last 1 month.  She was also requesting for medication refill.    Review of Systems  Constitutional: Negative for fever and fatigue.  HENT: Negative for nosebleeds, congestion, rhinorrhea, sneezing and postnasal drip.   Respiratory: Negative for apnea, cough, choking, chest tightness, shortness of breath, wheezing and stridor.   Cardiovascular: Negative for chest pain, palpitations and leg swelling.  Gastrointestinal: Negative for nausea, abdominal pain, diarrhea and constipation.  Genitourinary: Negative for dysuria, urgency and flank pain.  Musculoskeletal: Negative for arthralgias.  Neurological: Negative for light-headedness and headaches.       Objective:   Physical Exam  Constitutional: She is oriented to person, place, and time. She appears well-developed and well-nourished. No distress.  HENT:  Head: Normocephalic and atraumatic.  Mouth/Throat: No oropharyngeal exudate.  Eyes: Conjunctivae and EOM are normal. Pupils are equal, round, and reactive to light.  Neck: Normal range of motion. Neck supple. No JVD present. No tracheal deviation present. No thyromegaly present.  Cardiovascular: Normal rate, regular rhythm, normal heart sounds and intact distal pulses.  Exam reveals no gallop and no friction rub.   No murmur heard. Pulmonary/Chest: Effort normal  and breath sounds normal. No stridor. No respiratory distress. She has no wheezes. She has no rales. She exhibits no tenderness.  Abdominal: Soft. Bowel sounds are normal. She exhibits no distension. There is no tenderness. There is no rebound and no guarding.  Musculoskeletal: Normal range of motion. She exhibits no edema and no tenderness.       No obvious left shoulder erythema or swelling , normal ROM.  Lymphadenopathy:    She has no cervical adenopathy.  Neurological: She is alert and oriented to person, place, and time. She has normal reflexes. She displays normal reflexes. No cranial nerve deficit. Coordination normal.  Skin: Skin is warm. She is not diaphoretic.       No visible rash, scratch marks or insect bites.          Assessment & Plan:

## 2011-06-29 NOTE — Patient Instructions (Signed)
Please schedule a follow up appointment in 2 weeks for BP recheck. Please take your medicines as prescribed.

## 2011-06-30 ENCOUNTER — Other Ambulatory Visit: Payer: Self-pay | Admitting: Internal Medicine

## 2011-06-30 DIAGNOSIS — L299 Pruritus, unspecified: Secondary | ICD-10-CM | POA: Insufficient documentation

## 2011-06-30 LAB — HEPATIC FUNCTION PANEL
ALT: 10 U/L (ref 0–35)
AST: 13 U/L (ref 0–37)
Albumin: 4.5 g/dL (ref 3.5–5.2)
Alkaline Phosphatase: 46 U/L (ref 39–117)
Bilirubin, Direct: 0.1 mg/dL (ref 0.0–0.3)
Indirect Bilirubin: 0.3 mg/dL (ref 0.0–0.9)
Total Bilirubin: 0.4 mg/dL (ref 0.3–1.2)
Total Protein: 6.9 g/dL (ref 6.0–8.3)

## 2011-06-30 LAB — MAGNESIUM: Magnesium: 1.4 mg/dL — ABNORMAL LOW (ref 1.5–2.5)

## 2011-06-30 LAB — BASIC METABOLIC PANEL WITH GFR
BUN: 14 mg/dL (ref 6–23)
CO2: 27 mEq/L (ref 19–32)
Calcium: 10 mg/dL (ref 8.4–10.5)
Chloride: 103 mEq/L (ref 96–112)
Creat: 0.57 mg/dL (ref 0.50–1.10)
GFR, Est African American: >60 mL/min (ref >60)
GFR, Est Non African American: >60 mL/min (ref >60)
Glucose, Bld: 110 mg/dL — ABNORMAL HIGH (ref 70–99)
Potassium: 3.8 mEq/L (ref 3.5–5.3)
Sodium: 145 mEq/L (ref 135–145)

## 2011-06-30 MED ORDER — MAGNESIUM 200 MG PO TABS: 200.0000 mg | ORAL_TABLET | Freq: Every day | ORAL | Status: DC

## 2011-06-30 NOTE — Assessment & Plan Note (Signed)
Likely related to alcohol abuse. Will recheck BMT and magnesium levels today.

## 2011-06-30 NOTE — Telephone Encounter (Signed)
Patient was called in to discuss her lab results and inform her about the magnesium script that I [facility])

## 2011-06-30 NOTE — Assessment & Plan Note (Signed)
Lab Results  Component Value Date   NA 145 06/29/2011   K 3.8 06/29/2011   CL 103 06/29/2011   CO2 27 06/29/2011   BUN 14 06/29/2011   CREATININE 0.57 06/29/2011   CREATININE <0.47* 05/29/2011    BP Readings from Last 3 Encounters:  06/29/11 183/100  12/23/10 140/90  10/22/10 132/86    Assessment: Hypertension control:  moderately elevated  Progress toward goals:  deteriorated Barriers to meeting goals:  history of alcohol abuse  Plan: Hypertension treatment:  maxed out on her current meds. Will add amlodipine to her current regimen. She was advised to follow up in the clinic in 2weeks for BP recheck.

## 2011-06-30 NOTE — Assessment & Plan Note (Signed)
Lab Results  Component Value Date   HGBA1C 6.2 06/29/2011   HGBA1C 6.7 09/21/2010   CREATININE 0.57 06/29/2011   CREATININE <0.47* 05/29/2011   MICROALBUR 0.70 09/21/2010   MICRALBCREAT 7.7 09/21/2010   CHOL  Value: 182        ATP III CLASSIFICATION:  <200     mg/dL   Desirable  161-096  mg/dL   Borderline High  >=045    mg/dL   High        4/0/9811   HDL 98 04/05/2010   TRIG 54 07/01/4781    Last eye exam and foot exam:    Component Value Date/Time   HMDIABEYEEXA normal 07/26/2010   HMDIABFOOTEX done 09/21/2010    Assessment: Diabetes control: controlled Progress toward goals: at goal Barriers to meeting goals: no barriers identified  Plan: Diabetes treatment: continue current medications Refer to: none Instruction/counseling given: reminded to bring blood glucose meter & log to each visit, reminded to bring medications to each visit, discussed the need for weight loss and discussed diet

## 2011-06-30 NOTE — Assessment & Plan Note (Addendum)
He complains of generalized itching all over her body for the last one month. On exam there is no obvious rash or signs of insect bite.  - Will check her LFT's to rule out any obstructive liver pathology. - symptomatic treatment with Benadryl.

## 2011-06-30 NOTE — Assessment & Plan Note (Signed)
Differentials include musculoskeletal vs radiculopathy( cervical spine). Her last imaging from July 2012 was reviewed. Patient was noticed having some spurring at acromioclavicular joint and severe degenerative C4-C5 , C5-C6 disease. She was advised symptomatic treatment with as needed pain medications and stretching exercises. If her pain persists with next visit, we may consider physical therapy referral.

## 2011-07-13 ENCOUNTER — Ambulatory Visit: Payer: Medicare Other | Admitting: Internal Medicine

## 2011-07-20 ENCOUNTER — Emergency Department (HOSPITAL_COMMUNITY): Payer: Medicare Other

## 2011-07-20 ENCOUNTER — Emergency Department (HOSPITAL_COMMUNITY)
Admission: EM | Admit: 2011-07-20 | Discharge: 2011-07-21 | Disposition: A | Discharge status: Home / Self Care | Payer: Medicare Other | Attending: Emergency Medicine | Admitting: Emergency Medicine

## 2011-07-20 DIAGNOSIS — F1010 Alcohol abuse, uncomplicated: Secondary | ICD-10-CM | POA: Insufficient documentation

## 2011-07-20 DIAGNOSIS — M1990 Unspecified osteoarthritis, unspecified site: Secondary | ICD-10-CM | POA: Insufficient documentation

## 2011-07-20 DIAGNOSIS — E119 Type 2 diabetes mellitus without complications: Secondary | ICD-10-CM | POA: Insufficient documentation

## 2011-07-20 DIAGNOSIS — M25569 Pain in unspecified knee: Secondary | ICD-10-CM | POA: Insufficient documentation

## 2011-07-20 DIAGNOSIS — I10 Essential (primary) hypertension: Secondary | ICD-10-CM | POA: Insufficient documentation

## 2011-07-20 DIAGNOSIS — E7800 Pure hypercholesterolemia, unspecified: Secondary | ICD-10-CM | POA: Insufficient documentation

## 2011-07-20 LAB — RAPID URINE DRUG SCREEN, HOSP PERFORMED
Amphetamines: NOT DETECTED
Barbiturates: NOT DETECTED
Benzodiazepines: NOT DETECTED
Cocaine: NOT DETECTED
Opiates: NOT DETECTED
Tetrahydrocannabinol: NOT DETECTED

## 2011-07-20 LAB — COMPREHENSIVE METABOLIC PANEL
ALT: 12 U/L (ref 0–35)
AST: 17 U/L (ref 0–37)
Albumin: 4 g/dL (ref 3.5–5.2)
Alkaline Phosphatase: 57 U/L (ref 39–117)
BUN: 6 mg/dL (ref 6–23)
CO2: 24 mEq/L (ref 19–32)
Calcium: 9.1 mg/dL (ref 8.4–10.5)
Chloride: 95 mEq/L — ABNORMAL LOW (ref 96–112)
Creatinine, Ser: <0.47 mg/dL — ABNORMAL LOW (ref 0.50–1.10)
Glucose, Bld: 119 mg/dL — ABNORMAL HIGH (ref 70–99)
Potassium: 3.5 mEq/L (ref 3.5–5.1)
Sodium: 137 mEq/L (ref 135–145)
Total Bilirubin: 0.2 mg/dL — ABNORMAL LOW (ref 0.3–1.2)
Total Protein: 7.8 g/dL (ref 6.0–8.3)

## 2011-07-20 LAB — URINALYSIS, ROUTINE W REFLEX MICROSCOPIC
Bilirubin Urine: NEGATIVE
Glucose, UA: NEGATIVE mg/dL
Hgb urine dipstick: NEGATIVE
Ketones, ur: NEGATIVE mg/dL
Leukocytes, UA: NEGATIVE
Nitrite: NEGATIVE
Protein, ur: NEGATIVE mg/dL
Specific Gravity, Urine: 1.007 (ref 1.005–1.030)
Urobilinogen, UA: 0.2 mg/dL (ref 0.0–1.0)
pH: 6 (ref 5.0–8.0)

## 2011-07-20 LAB — CBC
HCT: 43.3 % (ref 36.0–46.0)
Hemoglobin: 15 g/dL (ref 12.0–15.0)
MCH: 30.8 pg (ref 26.0–34.0)
MCHC: 34.6 g/dL (ref 30.0–36.0)
MCV: 88.9 fL (ref 78.0–100.0)
Platelets: 259 10*3/uL (ref 150–400)
RBC: 4.87 MIL/uL (ref 3.87–5.11)
RDW: 15 % (ref 11.5–15.5)
WBC: 8.9 10*3/uL (ref 4.0–10.5)

## 2011-07-20 LAB — DIFFERENTIAL
Basophils Absolute: 0 10*3/uL (ref 0.0–0.1)
Basophils Relative: 0 % (ref 0–1)
Eosinophils Absolute: 0.2 10*3/uL (ref 0.0–0.7)
Eosinophils Relative: 2 % (ref 0–5)
Lymphocytes Relative: 50 % — ABNORMAL HIGH (ref 12–46)
Lymphs Abs: 4.5 10*3/uL — ABNORMAL HIGH (ref 0.7–4.0)
Monocytes Absolute: 0.5 10*3/uL (ref 0.1–1.0)
Monocytes Relative: 6 % (ref 3–12)
Neutro Abs: 3.7 10*3/uL (ref 1.7–7.7)
Neutrophils Relative %: 42 % — ABNORMAL LOW (ref 43–77)

## 2011-07-20 LAB — ETHANOL: Alcohol, Ethyl (B): 259 mg/dL — ABNORMAL HIGH (ref 0–11)

## 2011-07-21 LAB — ETHANOL: Alcohol, Ethyl (B): 61 mg/dL — ABNORMAL HIGH (ref 0–11)

## 2011-07-30 ENCOUNTER — Other Ambulatory Visit: Payer: Self-pay | Admitting: Internal Medicine

## 2011-08-04 LAB — GLUCOSE, CAPILLARY: Glucose-Capillary: 141 mg/dL — ABNORMAL HIGH (ref 70–99)

## 2011-08-23 ENCOUNTER — Other Ambulatory Visit: Payer: Self-pay | Admitting: *Deleted

## 2011-08-23 MED ORDER — ALPRAZOLAM 0.25 MG PO TABS: 0.2500 mg | ORAL_TABLET | Freq: Every evening | ORAL | Status: DC | PRN

## 2011-08-24 NOTE — Telephone Encounter (Signed)
Refill for Xanax called to [facility].

## 2011-08-29 ENCOUNTER — Encounter: Payer: Medicare Other | Admitting: Internal Medicine

## 2011-08-30 ENCOUNTER — Other Ambulatory Visit: Payer: Self-pay | Admitting: Internal Medicine

## 2011-09-01 ENCOUNTER — Ambulatory Visit (INDEPENDENT_AMBULATORY_CARE_PROVIDER_SITE_OTHER): Payer: Medicare Other | Admitting: Internal Medicine

## 2011-09-01 ENCOUNTER — Encounter: Payer: Self-pay | Admitting: Internal Medicine

## 2011-09-01 VITALS — BP 142/93 | HR 68 | Temp 97.4°F | Ht 62.0 in | Wt 261.0 lb

## 2011-09-01 DIAGNOSIS — E119 Type 2 diabetes mellitus without complications: Secondary | ICD-10-CM

## 2011-09-01 DIAGNOSIS — I10 Essential (primary) hypertension: Secondary | ICD-10-CM

## 2011-09-01 DIAGNOSIS — M1990 Unspecified osteoarthritis, unspecified site: Secondary | ICD-10-CM

## 2011-09-01 LAB — BASIC METABOLIC PANEL
BUN: 12 mg/dL (ref 6–23)
CO2: 25 mEq/L (ref 19–32)
Calcium: 9.8 mg/dL (ref 8.4–10.5)
Chloride: 102 mEq/L (ref 96–112)
Creat: 0.57 mg/dL (ref 0.50–1.10)
Glucose, Bld: 144 mg/dL — ABNORMAL HIGH (ref 70–99)
Potassium: 3.8 mEq/L (ref 3.5–5.3)
Sodium: 141 mEq/L (ref 135–145)

## 2011-09-01 LAB — GLUCOSE, CAPILLARY: Glucose-Capillary: 135 mg/dL — ABNORMAL HIGH (ref 70–99)

## 2011-09-01 LAB — MAGNESIUM: Magnesium: 1.5 mg/dL (ref 1.5–2.5)

## 2011-09-01 MED ORDER — IBUPROFEN 200 MG PO TABS: 200.0000 mg | ORAL_TABLET | Freq: Four times a day (QID) | ORAL | Status: DC | PRN

## 2011-09-01 NOTE — Progress Notes (Signed)
Subjective:   Patient ID: Allison Caldwell female   DOB: 08-10-1946 65 y.o.   MRN: [id number]  HPI: Allison Caldwell is a 65 y.o.  Pleasant woman is here for f/u on 1. Right knee pain. sustained a fall 2 months ago. Reports no improvement in pain which is dull, constant; no radiculopathy but worse with long standing or walking. Denies it giving way or locking up.    Past Medical History  Diagnosis Date  . Hypertension   . Diabetes mellitus     NIDDM  . Obesities, morbid   . TIA (transient ischemic attack)   . Depression   . Degenerative joint disease   . ETOH abuse   . Cardiomyopathy    Current Outpatient Prescriptions  Medication Sig Dispense Refill  . ALPRAZolam (XANAX) 0.25 MG tablet Take 1 tablet (0.25 mg total) by mouth at bedtime as needed for sleep.  30 tablet  3  . amLODipine (NORVASC) 5 MG tablet TAKE ONE TABLET BY MOUTH EVERY DAY  30 tablet  6  . aspirin 325 MG tablet Take 1 tablet (325 mg total) by mouth daily.  100 tablet  3  . CARDIZEM CD 360 MG 24 hr capsule TAKE ONE CAPSULE BY MOUTH EVERY DAY  31 each  5  . carvedilol (COREG) 25 MG tablet Take 25 mg by mouth 2 (two) times daily with meals.        Marland Kitchen DILTIAZEM HCL COATED BEADS PO Take 360 mg by mouth daily. (Cardizem CD 360 mg  XR 24H-CAP)       . glipiZIDE (GLUCOTROL) 5 MG tablet TAKE ONE TABLET BY MOUTH EVERY DAY  31 tablet  5  . Magnesium 200 MG TABS Take 1 tablet (200 mg total) by mouth daily.  30 each  0  . metFORMIN (GLUCOPHAGE) 500 MG tablet Take 1 tablet (500 mg total) by mouth 2 (two) times daily.  180 tablet  2  . olmesartan (BENICAR) 40 MG tablet Take 1 tablet (40 mg total) by mouth daily.  30 tablet  11  . omeprazole (PRILOSEC) 20 MG capsule TAKE ONE CAPSULE BY MOUTH TWICE DAILY  60 capsule  3  . sertraline (ZOLOFT) 50 MG tablet TAKE ONE TABLET BY MOUTH EVERY DAY  30 tablet  6  . simvastatin (ZOCOR) 20 MG tablet Take 1 tablet (20 mg total) by mouth at bedtime.  30 tablet  3   Family History  Problem Relation  Age of Onset  . Heart disease Mother   . Cancer Father   . Diabetes Sister   . Kidney disease Brother   . Stroke Brother    History   Social History  . Marital Status: Single    Spouse Name: N/A    Number of Children: N/A  . Years of Education: N/A   Occupational History  . House keeping-retired since 2006    Social History Main Topics  . Smoking status: Former Smoker    Types: Cigarettes  . Smokeless tobacco: None   Comment: used to smoke 1/2 pk aweek; quit 6/11.  Marland Kitchen Alcohol Use: No     Denies drinking since 6/11.  . Drug Use: None     THC occasionally.  . Sexually Active: None   Other Topics Concern  . None   Social History Narrative   Lives in [place].   Review of Systems: Constitutional: Denies fever, chills, diaphoresis, appetite change and fatigue.  HEENT: Denies photophobia, eye pain, redness, hearing loss, ear pain, congestion, sore throat, rhinorrhea,  sneezing, mouth sores, trouble swallowing, neck pain, neck stiffness and tinnitus.   Respiratory: Denies SOB, DOE, cough, chest tightness,  and wheezing.   Cardiovascular: Denies chest pain, palpitations and leg swelling.  Gastrointestinal: Denies nausea, vomiting, abdominal pain, diarrhea, constipation, blood in stool and abdominal distention.  Genitourinary: Denies dysuria, urgency, frequency, hematuria, flank pain and difficulty urinating.  Musculoskeletal: Denies myalgias, back pain, joint swelling,and gait problem.  right knee pain is being reported. Skin: Denies pallor, rash and wound.  Neurological: Denies dizziness, seizures, syncope, weakness, light-headedness, numbness and headaches.  Hematological: Denies adenopathy. Easy bruising, personal or family bleeding history  Psychiatric/Behavioral: Denies suicidal ideation, mood changes, confusion, nervousness, sleep disturbance and agitation  Objective:  Physical Exam: Filed Vitals:   09/01/11 1354  BP: 142/93  Pulse: 68  Temp: 97.4 F (36.3 C)    TempSrc: Oral  Height: 5\' 2"  (1.575 m)  Weight: 261 lb (118.389 kg)   Constitutional: Vital signs reviewed.  Patient is a well-developed and well-nourished woman in no acute distress and cooperative with exam. Alert and oriented x3.  Head: Normocephalic and atraumatic Ear: TM normal bilaterally Mouth: no erythema or exudates, MMM Eyes: PERRL, EOMI, conjunctivae normal, No scleral icterus.  Neck: Supple, Trachea midline normal ROM, No JVD, mass, thyromegaly, or carotid bruit present.  Cardiovascular: RRR, S1 normal, S2 normal, no MRG, pulses symmetric and intact bilaterally Pulmonary/Chest: CTAB, no wheezes, rales, or rhonchi Abdominal: Soft. Non-tender, non-distended, bowel sounds are normal, no masses, organomegaly, or guarding present.  GU: no CVA tenderness Musculoskeletal: No joint deformities, erythema, or stiffness, ROM full and no nontender; right knee with crepitus with extension; Lachman's and Drawer's tests negative; no locking. Hematology: no cervical, inginal, or axillary adenopathy.  Neurological: A&O x3, Strenght is normal and symmetric bilaterally, cranial nerve II-XII are grossly intact, no focal motor deficit, sensory intact to light touch bilaterally.  Skin: Warm, dry and intact. No rash, cyanosis, or clubbing.  Psychiatric: Normal mood and affect. speech and behavior is normal. Judgment and thought content normal. Cognition and memory are normal.   Assessment & Plan:    1. R knee pain sp fall 2 months ago -no improvement -Xray with DJD - referral for a PT -Ibuprofen 200 mg i-ii Tabs PO q 6 hrs PRn with meals.  2. Hypokalemia (2/2 ?volume contraction) -will repeat Bmet and Mg levels today  3. HTN - will d/c amlodipine -continue with Diltiazem -weight management! -low salt diet; daily exercise -F/u in 4 weeks.

## 2011-09-01 NOTE — Progress Notes (Signed)
Addended by: Denna Haggard on: 09/01/2011 02:29 PM   Modules accepted: Orders

## 2011-09-01 NOTE — Progress Notes (Deleted)
  Subjective:    Patient ID: Allison Caldwell, female    DOB: [birth year], 65 y.o.   MRN: [id number]  HPI    Review of Systems     Objective:   Physical Exam        Assessment & Plan:

## 2011-09-01 NOTE — Patient Instructions (Addendum)
Please, stop taking Amlodipine (Norvasc) Start Taking Ibuprofen 200 mg one -to two tablets by mouth every 6 hours as needed for right knee pain. You are being referred for a physical therapy. Please, call with any questions and follow up in 4 weeks.

## 2011-09-28 ENCOUNTER — Other Ambulatory Visit: Payer: Self-pay | Admitting: Internal Medicine

## 2011-09-28 ENCOUNTER — Encounter (HOSPITAL_COMMUNITY): Payer: Self-pay | Admitting: *Deleted

## 2011-09-28 ENCOUNTER — Emergency Department (HOSPITAL_COMMUNITY)
Admission: EM | Admit: 2011-09-28 | Discharge: 2011-09-29 | Disposition: A | Discharge status: Home / Self Care | Payer: Medicare Other | Attending: Emergency Medicine | Admitting: Emergency Medicine

## 2011-09-28 DIAGNOSIS — E785 Hyperlipidemia, unspecified: Secondary | ICD-10-CM

## 2011-09-28 DIAGNOSIS — R1032 Left lower quadrant pain: Secondary | ICD-10-CM | POA: Insufficient documentation

## 2011-09-28 DIAGNOSIS — Z8673 Personal history of transient ischemic attack (TIA), and cerebral infarction without residual deficits: Secondary | ICD-10-CM | POA: Insufficient documentation

## 2011-09-28 DIAGNOSIS — N898 Other specified noninflammatory disorders of vagina: Secondary | ICD-10-CM | POA: Insufficient documentation

## 2011-09-28 DIAGNOSIS — M545 Low back pain, unspecified: Secondary | ICD-10-CM | POA: Insufficient documentation

## 2011-09-28 DIAGNOSIS — IMO0002 Reserved for concepts with insufficient information to code with codable children: Secondary | ICD-10-CM | POA: Insufficient documentation

## 2011-09-28 DIAGNOSIS — I10 Essential (primary) hypertension: Secondary | ICD-10-CM | POA: Insufficient documentation

## 2011-09-28 DIAGNOSIS — E119 Type 2 diabetes mellitus without complications: Secondary | ICD-10-CM | POA: Insufficient documentation

## 2011-09-28 DIAGNOSIS — R1031 Right lower quadrant pain: Secondary | ICD-10-CM | POA: Insufficient documentation

## 2011-09-28 NOTE — ED Notes (Signed)
Pt is here for evaluation for abdominal pain.  No n/v or diarrhea with this.  Pt has been drinking heavily this am and is pleasantly intoxicated at this time.  Pt has had a URI (mask given)

## 2011-09-29 ENCOUNTER — Emergency Department (HOSPITAL_COMMUNITY): Payer: Medicare Other

## 2011-09-29 LAB — URINALYSIS, ROUTINE W REFLEX MICROSCOPIC
Bilirubin Urine: NEGATIVE
Glucose, UA: NEGATIVE mg/dL
Hgb urine dipstick: NEGATIVE
Ketones, ur: NEGATIVE mg/dL
Leukocytes, UA: NEGATIVE
Nitrite: NEGATIVE
Protein, ur: NEGATIVE mg/dL
Specific Gravity, Urine: 1.002 — ABNORMAL LOW (ref 1.005–1.030)
Urobilinogen, UA: 0.2 mg/dL (ref 0.0–1.0)
pH: 5.5 (ref 5.0–8.0)

## 2011-09-29 LAB — WET PREP, GENITAL
Clue Cells Wet Prep HPF POC: NONE SEEN
Trich, Wet Prep: NONE SEEN
WBC, Wet Prep HPF POC: NONE SEEN
Yeast Wet Prep HPF POC: NONE SEEN

## 2011-09-29 MED ORDER — NAPROXEN 500 MG PO TABS: 500.0000 mg | ORAL_TABLET | Freq: Two times a day (BID) | ORAL | Status: DC

## 2011-09-29 NOTE — ED Notes (Signed)
Patient wheeled to restroom by nurse tech via wheelchair.

## 2011-09-29 NOTE — ED Notes (Signed)
Patient given discharge paperwork.  Instructed to follow up with primary care physician and OB/GYN if symptoms persist.  Patient instructed to take Naproxen as directed and to return to the ED for new, worsening, or concerning symptoms.

## 2011-09-29 NOTE — ED Provider Notes (Signed)
History     CSN: [id number] Arrival date & time: 09/28/2011  9:42 PM   First MD Initiated Contact with Patient 09/29/11 [phone]      Chief Complaint  Patient presents with  . Abdominal Pain    (Consider location/radiation/quality/duration/timing/severity/associated sxs/prior treatment) HPI Comments: Pt states that she has been having unprotected sex with several people and has been "smelling terrible down there", also notes taht she has pain with intercourse.  Pain is constant for months, and radiates to the lower back.  No fever, vomiting, cough, rash, distention.  Patient is a 65 y.o. female presenting with abdominal pain. The history is provided by the patient.  Abdominal Pain The primary symptoms of the illness include abdominal pain and vaginal discharge. The primary symptoms of the illness do not include fever, fatigue, shortness of breath, nausea, vomiting, diarrhea, hematemesis, hematochezia, dysuria or vaginal bleeding. Episode onset: "months" The onset of the illness was gradual. The problem has not changed since onset. The pain came on gradually. The abdominal pain is located in the LLQ, RLQ and suprapubic region. Pain radiation: lower back. The abdominal pain is relieved by nothing. Exacerbated by: sexual intercourse.  The vaginal discharge is not associated with dysuria.     Past Medical History  Diagnosis Date  . Hypertension   . Diabetes mellitus     NIDDM  . Obesities, morbid   . TIA (transient ischemic attack)   . Depression   . Degenerative joint disease   . ETOH abuse   . Cardiomyopathy     Past Surgical History  Procedure Date  . Orif ankle fracture     right  . Cesarean section     times 2  . Eye surgery     laser - right eye 2010  . Left wrist     surgical repair    Family History  Problem Relation Age of Onset  . Heart disease Mother   . Cancer Father   . Diabetes Sister   . Kidney disease Brother   . Stroke Brother     History    Substance Use Topics  . Smoking status: Former Smoker -- 0.5 packs/day    Types: Cigarettes  . Smokeless tobacco: Not on file   Comment: used to smoke 1/2 pk aweek; quit 6/11.  Marland Kitchen Alcohol Use: 0.0 oz/week     intoxicated at this time    OB History    Grav Para Term Preterm Abortions TAB SAB Ect Mult Living                  Review of Systems  Constitutional: Negative for fever and fatigue.  Respiratory: Negative for shortness of breath.   Gastrointestinal: Positive for abdominal pain. Negative for nausea, vomiting, diarrhea, hematochezia and hematemesis.  Genitourinary: Positive for vaginal discharge. Negative for dysuria and vaginal bleeding.  All other systems reviewed and are negative.    Allergies  Ace inhibitors  Home Medications   Current Outpatient Rx  Name Route Sig Dispense Refill  . ALPRAZOLAM 0.25 MG PO TABS Oral Take 0.25 mg by mouth at bedtime as needed. For anxiety     . ASPIRIN 325 MG PO TABS Oral Take 325 mg by mouth daily.      Marland Kitchen CARVEDILOL 25 MG PO TABS Oral Take 25 mg by mouth 2 (two) times daily with meals.      Marland Kitchen DILTIAZEM HCL COATED BEADS PO Oral Take 360 mg by mouth daily. (Cardizem CD 360 mg  XR  24H-CAP)    . GLIPIZIDE 5 MG PO TABS Oral Take 5 mg by mouth daily.      . IBUPROFEN 200 MG PO TABS Oral Take 200 mg by mouth every 6 (six) hours as needed. For pain     . MAGNESIUM OXIDE 200 MG PO TABS Oral Take 200 mg by mouth daily.      Marland Kitchen METFORMIN HCL 500 MG PO TABS Oral Take 500 mg by mouth 2 (two) times daily.      Marland Kitchen OLMESARTAN MEDOXOMIL 40 MG PO TABS Oral Take 40 mg by mouth daily.      . SERTRALINE HCL 50 MG PO TABS Oral Take 50 mg by mouth daily.      Marland Kitchen SIMVASTATIN 20 MG PO TABS Oral Take 20 mg by mouth at bedtime.      Marland Kitchen SIMVASTATIN 20 MG PO TABS Oral Take 20 mg by mouth at bedtime.      Marland Kitchen NAPROXEN 500 MG PO TABS Oral Take 1 tablet (500 mg total) by mouth 2 (two) times daily with a meal. 30 tablet 0    BP 132/87  Pulse 79  Temp(Src) 98.5 F  (36.9 C) (Oral)  Resp 16  SpO2 98%  Physical Exam  Nursing note and vitals reviewed. Constitutional: She appears well-developed and well-nourished. No distress.  HENT:  Head: Normocephalic and atraumatic.  Mouth/Throat: Oropharynx is clear and moist. No oropharyngeal exudate.  Eyes: Conjunctivae and EOM are normal. Pupils are equal, round, and reactive to light. Right eye exhibits no discharge. Left eye exhibits no discharge. No scleral icterus.  Neck: Normal range of motion. Neck supple. No JVD present. No thyromegaly present.  Cardiovascular: Normal rate, regular rhythm, normal heart sounds and intact distal pulses.  Exam reveals no gallop and no friction rub.   No murmur heard. Pulmonary/Chest: Effort normal and breath sounds normal. No respiratory distress. She has no wheezes. She has no rales.  Abdominal: Soft. Bowel sounds are normal. She exhibits no distension and no mass. There is tenderness ( lower abd ttp, non peritoneal).  Genitourinary:       No cervix seen, patient states "I don't have a cervix", no foul odor, discharge, bleeding present, no tenderness on exam  Musculoskeletal: Normal range of motion. She exhibits no edema and no tenderness.  Lymphadenopathy:    She has no cervical adenopathy.  Neurological: She is alert. Coordination normal.  Skin: Skin is warm and dry. No rash noted. No erythema.  Psychiatric: She has a normal mood and affect. Her behavior is normal.    ED Course  Procedures (including critical care time)  Labs Reviewed  URINALYSIS, ROUTINE W REFLEX MICROSCOPIC - Abnormal; Notable for the following:    Specific Gravity, Urine 1.002 (*)    All other components within normal limits  WET PREP, GENITAL  GC/CHLAMYDIA PROBE AMP, GENITAL   Dg Abd Acute W/chest  09/29/2011  *RADIOLOGY REPORT*  Clinical Data: Lower abdominal pain.  ACUTE ABDOMEN SERIES (ABDOMEN 2 VIEW & CHEST 1 VIEW)  Comparison: CT of the abdomen and pelvis dated 04/09/2009  Findings:  Chest x-ray shows moderate cardiomegaly and tortuosity of the thoracic aorta.  No edema or infiltrate.  No pleural fluid identified.  Abdominal films show a nonobstructive bowel gas pattern.  No evidence of free air.  No abnormal calcifications.  Degenerative spondylosis present within the lumbar spine.  IMPRESSION: Cardiomegaly.  No evidence of bowel obstruction or abnormal calcifications.  Original Report Authenticated By: Reola Calkins, M.D.  1. Abdominal pain       MDM  Pt with mild slurred speech, lower abd ttp and possible STI, UA negative, pelvic pending.  Laboratory workup reveals urinalysis which is clean, no signs of sexual transmitted disease on exam or on wet prep and acute abdominal series with no signs of obstruction. Vital signs remain reassuring, abdominal exam benign, patient encouraged to followup closely with her GYN for further testing for ongoing pain. She states that her pain is gone at this time and requests something to eat      Vida Roller, MD 09/29/11 0330

## 2011-09-29 NOTE — ED Notes (Signed)
Patient currently resting quietly in bed; no respiratory or acute distress noted.  Will continue to monitor. 

## 2011-09-29 NOTE — ED Notes (Signed)
Patient transported to X-ray 

## 2011-09-29 NOTE — ED Notes (Signed)
Patient complaining of lower abdominal pain that radiates to her lower back.  Patient states that it has been hurting for months after she had sexual activity; states that she has been taking aspirin for pain.  Patient unable to rate or describe pain.  Patient states that the pain worsens upon standing; pain gets better upon sitting down.  Patient denies any changes in her urine, but reports abnormal vaginal odor.  Patient also states that she has been drinking alcohol (unable to state how much she has had to drink).  Patient alert and oriented x4; PERRL present.  Will continue to monitor.

## 2011-09-29 NOTE — ED Notes (Signed)
Patient back from x-ray.  Currently sitting up in bed; no respiratory or acute distress noted.  Patient has no questions or concerns at this time.  Will continue to monitor.

## 2011-09-29 NOTE — ED Notes (Signed)
Patient currently sitting up in bed; no respiratory or acute distress noted.  Patient given gown to change into.  Pelvic cart set up at bedside.  Patient has no other questions or concerns at this time; will continue to monitor.

## 2011-09-29 NOTE — ED Notes (Signed)
Patient currently sitting up in bed; no respiratory or acute distress noted.  Patient given [place] sandwich and water.  Patient has no other questions or concerns at this time.  Updated patient on plan of care; patient aware that Allison Caldwell is being discharged.  Patient states that Allison Caldwell will wait in the waiting room until morning when the buses run.  Will continue to monitor.

## 2011-09-30 LAB — GC/CHLAMYDIA PROBE AMP, GENITAL
Chlamydia, DNA Probe: NEGATIVE
GC Probe Amp, Genital: NEGATIVE

## 2011-10-05 NOTE — Progress Notes (Signed)
Addended by: Neomia Dear on: 10/05/2011 02:12 PM   Modules accepted: Orders

## 2011-11-08 ENCOUNTER — Encounter: Payer: Medicare Other | Admitting: Internal Medicine

## 2011-11-20 ENCOUNTER — Emergency Department (HOSPITAL_COMMUNITY): Payer: Medicare Other

## 2011-11-20 ENCOUNTER — Encounter (HOSPITAL_COMMUNITY): Payer: Self-pay | Admitting: *Deleted

## 2011-11-20 ENCOUNTER — Inpatient Hospital Stay (HOSPITAL_COMMUNITY)
Admission: AD | Admit: 2011-11-20 | Discharge: 2011-11-30 | DRG: 871 | Disposition: A | Discharge status: Skilled Nursing Facility | Payer: Medicare Other | Attending: Internal Medicine | Admitting: Internal Medicine

## 2011-11-20 DIAGNOSIS — Z8673 Personal history of transient ischemic attack (TIA), and cerebral infarction without residual deficits: Secondary | ICD-10-CM

## 2011-11-20 DIAGNOSIS — I4729 Other ventricular tachycardia: Secondary | ICD-10-CM | POA: Diagnosis present

## 2011-11-20 DIAGNOSIS — K859 Acute pancreatitis without necrosis or infection, unspecified: Secondary | ICD-10-CM | POA: Diagnosis not present

## 2011-11-20 DIAGNOSIS — M25511 Pain in right shoulder: Secondary | ICD-10-CM | POA: Diagnosis present

## 2011-11-20 DIAGNOSIS — S43429A Sprain of unspecified rotator cuff capsule, initial encounter: Secondary | ICD-10-CM | POA: Diagnosis present

## 2011-11-20 DIAGNOSIS — R579 Shock, unspecified: Secondary | ICD-10-CM

## 2011-11-20 DIAGNOSIS — Z6841 Body Mass Index (BMI) 40.0 and over, adult: Secondary | ICD-10-CM | POA: Diagnosis present

## 2011-11-20 DIAGNOSIS — IMO0001 Reserved for inherently not codable concepts without codable children: Secondary | ICD-10-CM | POA: Diagnosis present

## 2011-11-20 DIAGNOSIS — Z833 Family history of diabetes mellitus: Secondary | ICD-10-CM

## 2011-11-20 DIAGNOSIS — E669 Obesity, unspecified: Secondary | ICD-10-CM | POA: Diagnosis present

## 2011-11-20 DIAGNOSIS — I10 Essential (primary) hypertension: Secondary | ICD-10-CM | POA: Diagnosis not present

## 2011-11-20 DIAGNOSIS — S4980XA Other specified injuries of shoulder and upper arm, unspecified arm, initial encounter: Secondary | ICD-10-CM | POA: Diagnosis not present

## 2011-11-20 DIAGNOSIS — T23009A Burn of unspecified degree of unspecified hand, unspecified site, initial encounter: Secondary | ICD-10-CM | POA: Diagnosis not present

## 2011-11-20 DIAGNOSIS — Z823 Family history of stroke: Secondary | ICD-10-CM

## 2011-11-20 DIAGNOSIS — I471 Supraventricular tachycardia, unspecified: Secondary | ICD-10-CM | POA: Diagnosis present

## 2011-11-20 DIAGNOSIS — X58XXXA Exposure to other specified factors, initial encounter: Secondary | ICD-10-CM | POA: Diagnosis present

## 2011-11-20 DIAGNOSIS — E876 Hypokalemia: Secondary | ICD-10-CM | POA: Diagnosis present

## 2011-11-20 DIAGNOSIS — R6520 Severe sepsis without septic shock: Secondary | ICD-10-CM | POA: Diagnosis not present

## 2011-11-20 DIAGNOSIS — G9341 Metabolic encephalopathy: Secondary | ICD-10-CM | POA: Diagnosis present

## 2011-11-20 DIAGNOSIS — F341 Dysthymic disorder: Secondary | ICD-10-CM | POA: Diagnosis present

## 2011-11-20 DIAGNOSIS — S59919A Unspecified injury of unspecified forearm, initial encounter: Secondary | ICD-10-CM | POA: Diagnosis not present

## 2011-11-20 DIAGNOSIS — R4182 Altered mental status, unspecified: Secondary | ICD-10-CM | POA: Diagnosis not present

## 2011-11-20 DIAGNOSIS — I517 Cardiomegaly: Secondary | ICD-10-CM | POA: Diagnosis not present

## 2011-11-20 DIAGNOSIS — E1159 Type 2 diabetes mellitus with other circulatory complications: Secondary | ICD-10-CM | POA: Insufficient documentation

## 2011-11-20 DIAGNOSIS — Z043 Encounter for examination and observation following other accident: Secondary | ICD-10-CM | POA: Diagnosis not present

## 2011-11-20 DIAGNOSIS — S6990XA Unspecified injury of unspecified wrist, hand and finger(s), initial encounter: Secondary | ICD-10-CM | POA: Diagnosis not present

## 2011-11-20 DIAGNOSIS — Y998 Other external cause status: Secondary | ICD-10-CM

## 2011-11-20 DIAGNOSIS — R945 Abnormal results of liver function studies: Secondary | ICD-10-CM

## 2011-11-20 DIAGNOSIS — E872 Acidosis, unspecified: Secondary | ICD-10-CM | POA: Diagnosis present

## 2011-11-20 DIAGNOSIS — E871 Hypo-osmolality and hyponatremia: Secondary | ICD-10-CM | POA: Diagnosis not present

## 2011-11-20 DIAGNOSIS — N179 Acute kidney failure, unspecified: Secondary | ICD-10-CM | POA: Diagnosis present

## 2011-11-20 DIAGNOSIS — IMO0002 Reserved for concepts with insufficient information to code with codable children: Secondary | ICD-10-CM | POA: Diagnosis not present

## 2011-11-20 DIAGNOSIS — H409 Unspecified glaucoma: Secondary | ICD-10-CM | POA: Diagnosis present

## 2011-11-20 DIAGNOSIS — E118 Type 2 diabetes mellitus with unspecified complications: Secondary | ICD-10-CM | POA: Diagnosis present

## 2011-11-20 DIAGNOSIS — I428 Other cardiomyopathies: Secondary | ICD-10-CM | POA: Diagnosis present

## 2011-11-20 DIAGNOSIS — A419 Sepsis, unspecified organism: Principal | ICD-10-CM | POA: Diagnosis present

## 2011-11-20 DIAGNOSIS — F1020 Alcohol dependence, uncomplicated: Secondary | ICD-10-CM | POA: Diagnosis present

## 2011-11-20 DIAGNOSIS — T300 Burn of unspecified body region, unspecified degree: Secondary | ICD-10-CM | POA: Diagnosis not present

## 2011-11-20 DIAGNOSIS — G9340 Encephalopathy, unspecified: Secondary | ICD-10-CM

## 2011-11-20 DIAGNOSIS — M25559 Pain in unspecified hip: Secondary | ICD-10-CM | POA: Diagnosis not present

## 2011-11-20 DIAGNOSIS — F17200 Nicotine dependence, unspecified, uncomplicated: Secondary | ICD-10-CM | POA: Diagnosis present

## 2011-11-20 DIAGNOSIS — E119 Type 2 diabetes mellitus without complications: Secondary | ICD-10-CM | POA: Diagnosis present

## 2011-11-20 DIAGNOSIS — R578 Other shock: Secondary | ICD-10-CM | POA: Diagnosis present

## 2011-11-20 DIAGNOSIS — S298XXA Other specified injuries of thorax, initial encounter: Secondary | ICD-10-CM | POA: Diagnosis not present

## 2011-11-20 DIAGNOSIS — M6282 Rhabdomyolysis: Secondary | ICD-10-CM | POA: Diagnosis not present

## 2011-11-20 DIAGNOSIS — T2220XA Burn of second degree of shoulder and upper limb, except wrist and hand, unspecified site, initial encounter: Secondary | ICD-10-CM

## 2011-11-20 DIAGNOSIS — R6521 Severe sepsis with septic shock: Secondary | ICD-10-CM

## 2011-11-20 DIAGNOSIS — J9819 Other pulmonary collapse: Secondary | ICD-10-CM | POA: Diagnosis not present

## 2011-11-20 DIAGNOSIS — I472 Ventricular tachycardia, unspecified: Secondary | ICD-10-CM | POA: Diagnosis present

## 2011-11-20 DIAGNOSIS — S59909A Unspecified injury of unspecified elbow, initial encounter: Secondary | ICD-10-CM | POA: Diagnosis not present

## 2011-11-20 DIAGNOSIS — M47812 Spondylosis without myelopathy or radiculopathy, cervical region: Secondary | ICD-10-CM | POA: Diagnosis not present

## 2011-11-20 DIAGNOSIS — L138 Other specified bullous disorders: Secondary | ICD-10-CM | POA: Diagnosis present

## 2011-11-20 DIAGNOSIS — F1010 Alcohol abuse, uncomplicated: Secondary | ICD-10-CM | POA: Diagnosis present

## 2011-11-20 DIAGNOSIS — I152 Hypertension secondary to endocrine disorders: Secondary | ICD-10-CM | POA: Insufficient documentation

## 2011-11-20 LAB — POCT I-STAT, CHEM 8
BUN: 74 mg/dL — ABNORMAL HIGH (ref 6–23)
Calcium, Ion: 0.96 mmol/L — ABNORMAL LOW (ref 1.12–1.32)
Chloride: 95 mEq/L — ABNORMAL LOW (ref 96–112)
Creatinine, Ser: 4.1 mg/dL — ABNORMAL HIGH (ref 0.50–1.10)
Glucose, Bld: 336 mg/dL — ABNORMAL HIGH (ref 70–99)
HCT: 58 % — ABNORMAL HIGH (ref 36.0–46.0)
Hemoglobin: 19.7 g/dL — ABNORMAL HIGH (ref 12.0–15.0)
Potassium: 2.8 mEq/L — ABNORMAL LOW (ref 3.5–5.1)
Sodium: 125 mEq/L — ABNORMAL LOW (ref 135–145)
TCO2: 22 mmol/L (ref 0–100)

## 2011-11-20 LAB — URINALYSIS, ROUTINE W REFLEX MICROSCOPIC
Glucose, UA: 100 mg/dL — AB
Glucose, UA: 100 mg/dL — AB
Ketones, ur: 15 mg/dL — AB
Ketones, ur: 15 mg/dL — AB
Leukocytes, UA: NEGATIVE
Leukocytes, UA: NEGATIVE
Nitrite: NEGATIVE
Nitrite: NEGATIVE
Protein, ur: 100 mg/dL — AB
Protein, ur: >300 mg/dL — AB
Specific Gravity, Urine: 1.022 (ref 1.005–1.030)
Specific Gravity, Urine: 1.021 (ref 1.005–1.030)
Urobilinogen, UA: 1 mg/dL (ref 0.0–1.0)
Urobilinogen, UA: 0.2 mg/dL (ref 0.0–1.0)
pH: 5.5 (ref 5.0–8.0)
pH: 5.5 (ref 5.0–8.0)

## 2011-11-20 LAB — DIFFERENTIAL
Basophils Absolute: 0 10*3/uL (ref 0.0–0.1)
Basophils Relative: 0 % (ref 0–1)
Eosinophils Absolute: 0 10*3/uL (ref 0.0–0.7)
Eosinophils Relative: 0 % (ref 0–5)
Lymphocytes Relative: 10 % — ABNORMAL LOW (ref 12–46)
Lymphs Abs: 1.4 10*3/uL (ref 0.7–4.0)
Monocytes Absolute: 1.3 10*3/uL — ABNORMAL HIGH (ref 0.1–1.0)
Monocytes Relative: 9 % (ref 3–12)
Neutro Abs: 11.2 10*3/uL — ABNORMAL HIGH (ref 1.7–7.7)
Neutrophils Relative %: 81 % — ABNORMAL HIGH (ref 43–77)

## 2011-11-20 LAB — URINE MICROSCOPIC-ADD ON

## 2011-11-20 LAB — COMPREHENSIVE METABOLIC PANEL
ALT: 61 U/L — ABNORMAL HIGH (ref 0–35)
ALT: 67 U/L — ABNORMAL HIGH (ref 0–35)
AST: 171 U/L — ABNORMAL HIGH (ref 0–37)
AST: 209 U/L — ABNORMAL HIGH (ref 0–37)
Albumin: 3.3 g/dL — ABNORMAL LOW (ref 3.5–5.2)
Albumin: 2.7 g/dL — ABNORMAL LOW (ref 3.5–5.2)
Alkaline Phosphatase: 58 U/L (ref 39–117)
Alkaline Phosphatase: 55 U/L (ref 39–117)
BUN: 76 mg/dL — ABNORMAL HIGH (ref 6–23)
BUN: 76 mg/dL — ABNORMAL HIGH (ref 6–23)
CO2: 18 mEq/L — ABNORMAL LOW (ref 19–32)
CO2: 15 mEq/L — ABNORMAL LOW (ref 19–32)
Calcium: 9.2 mg/dL (ref 8.4–10.5)
Calcium: 7.7 mg/dL — ABNORMAL LOW (ref 8.4–10.5)
Chloride: 81 mEq/L — ABNORMAL LOW (ref 96–112)
Chloride: 87 mEq/L — ABNORMAL LOW (ref 96–112)
Creatinine, Ser: 4.02 mg/dL — ABNORMAL HIGH (ref 0.50–1.10)
Creatinine, Ser: 3.94 mg/dL — ABNORMAL HIGH (ref 0.50–1.10)
GFR calc Af Amer: 12 mL/min — ABNORMAL LOW (ref >90)
GFR calc Af Amer: 13 mL/min — ABNORMAL LOW (ref >90)
GFR calc non Af Amer: 11 mL/min — ABNORMAL LOW (ref >90)
GFR calc non Af Amer: 11 mL/min — ABNORMAL LOW (ref >90)
Glucose, Bld: 350 mg/dL — ABNORMAL HIGH (ref 70–99)
Glucose, Bld: 352 mg/dL — ABNORMAL HIGH (ref 70–99)
Potassium: 3 mEq/L — ABNORMAL LOW (ref 3.5–5.1)
Potassium: 2.4 mEq/L — CL (ref 3.5–5.1)
Sodium: 125 mEq/L — ABNORMAL LOW (ref 135–145)
Sodium: 125 mEq/L — ABNORMAL LOW (ref 135–145)
Total Bilirubin: 1 mg/dL (ref 0.3–1.2)
Total Bilirubin: 1.1 mg/dL (ref 0.3–1.2)
Total Protein: 6.9 g/dL (ref 6.0–8.3)
Total Protein: 5.9 g/dL — ABNORMAL LOW (ref 6.0–8.3)

## 2011-11-20 LAB — CBC
HCT: 45.6 % (ref 36.0–46.0)
HCT: 46.9 % — ABNORMAL HIGH (ref 36.0–46.0)
Hemoglobin: 16.7 g/dL — ABNORMAL HIGH (ref 12.0–15.0)
Hemoglobin: 17.1 g/dL — ABNORMAL HIGH (ref 12.0–15.0)
MCH: 31.9 pg (ref 26.0–34.0)
MCH: 31.5 pg (ref 26.0–34.0)
MCHC: 36.6 g/dL — ABNORMAL HIGH (ref 30.0–36.0)
MCHC: 36.5 g/dL — ABNORMAL HIGH (ref 30.0–36.0)
MCV: 87.2 fL (ref 78.0–100.0)
MCV: 86.4 fL (ref 78.0–100.0)
Platelets: 231 10*3/uL (ref 150–400)
Platelets: 253 10*3/uL (ref 150–400)
RBC: 5.23 MIL/uL — ABNORMAL HIGH (ref 3.87–5.11)
RBC: 5.43 MIL/uL — ABNORMAL HIGH (ref 3.87–5.11)
RDW: 14.1 % (ref 11.5–15.5)
RDW: 14.2 % (ref 11.5–15.5)
WBC: 12.3 10*3/uL — ABNORMAL HIGH (ref 4.0–10.5)
WBC: 19 10*3/uL — ABNORMAL HIGH (ref 4.0–10.5)

## 2011-11-20 LAB — LACTIC ACID, PLASMA: Lactic Acid, Venous: 3.6 mmol/L — ABNORMAL HIGH (ref 0.5–2.2)

## 2011-11-20 LAB — URINE CULTURE
Colony Count: NO GROWTH
Colony Count: NO GROWTH
Culture  Setup Time: 201301202107
Culture  Setup Time: 201301210227
Culture: NO GROWTH
Culture: NO GROWTH
Special Requests: NORMAL

## 2011-11-20 LAB — STREP PNEUMONIAE URINARY ANTIGEN: Strep Pneumo Urinary Antigen: NEGATIVE

## 2011-11-20 LAB — ETHANOL: Alcohol, Ethyl (B): <11 mg/dL (ref 0–11)

## 2011-11-20 LAB — AMYLASE: Amylase: 94 U/L (ref 0–105)

## 2011-11-20 LAB — LEGIONELLA ANTIGEN, URINE: Legionella Antigen, Urine: NEGATIVE

## 2011-11-20 LAB — POCT I-STAT TROPONIN I: Troponin i, poc: 0.48 ng/mL (ref 0.00–0.08)

## 2011-11-20 LAB — CK TOTAL AND CKMB (NOT AT ARMC)
CK, MB: 111 ng/mL (ref 0.3–4.0)
Relative Index: 0.7 (ref 0.0–2.5)
Total CK: 16589 U/L — ABNORMAL HIGH (ref 7–177)

## 2011-11-20 LAB — PROLACTIN: Prolactin: 167.4 ng/mL

## 2011-11-20 LAB — RAPID URINE DRUG SCREEN, HOSP PERFORMED
Amphetamines: NOT DETECTED
Barbiturates: NOT DETECTED
Benzodiazepines: NOT DETECTED
Cocaine: NOT DETECTED
Opiates: NOT DETECTED
Tetrahydrocannabinol: NOT DETECTED

## 2011-11-20 LAB — LIPASE, BLOOD
Lipase: 153 U/L — ABNORMAL HIGH (ref 11–59)
Lipase: 175 U/L — ABNORMAL HIGH (ref 11–59)

## 2011-11-20 LAB — PROCALCITONIN: Procalcitonin: 1.54 ng/mL

## 2011-11-20 LAB — MRSA PCR SCREENING: MRSA by PCR: NEGATIVE

## 2011-11-20 LAB — MAGNESIUM: Magnesium: 2.1 mg/dL (ref 1.5–2.5)

## 2011-11-20 LAB — TROPONIN I: Troponin I: 0.42 ng/mL (ref <0.30)

## 2011-11-20 LAB — PRO B NATRIURETIC PEPTIDE: Pro B Natriuretic peptide (BNP): 4896 pg/mL — ABNORMAL HIGH (ref 0–125)

## 2011-11-20 LAB — PHOSPHORUS: Phosphorus: 6.7 mg/dL — ABNORMAL HIGH (ref 2.3–4.6)

## 2011-11-20 LAB — GLUCOSE, CAPILLARY: Glucose-Capillary: 328 mg/dL — ABNORMAL HIGH (ref 70–99)

## 2011-11-20 MED ORDER — SODIUM CHLORIDE 0.9 % IV SOLN: 250.0000 mL | INTRAVENOUS | Status: DC | PRN

## 2011-11-20 MED ORDER — SODIUM CHLORIDE 0.9 % IV BOLUS (SEPSIS)
1000.0000 mL | Freq: Once | INTRAVENOUS | Status: AC
  Administered 2011-11-20: 1000 mL via INTRAVENOUS

## 2011-11-20 MED ORDER — DEXTROSE 5 % IV SOLN
INTRAVENOUS | Status: AC
  Filled 2011-11-20: qty 250

## 2011-11-20 MED ORDER — ASPIRIN 300 MG RE SUPP
300.0000 mg | RECTAL | Status: AC
  Administered 2011-11-20: 300 mg via RECTAL

## 2011-11-20 MED ORDER — NOREPINEPHRINE BITARTRATE 1 MG/ML IJ SOLN
2.0000 ug/min | INTRAVENOUS | Status: DC
  Filled 2011-11-20: qty 4

## 2011-11-20 MED ORDER — ASPIRIN 300 MG RE SUPP
RECTAL | Status: AC
  Filled 2011-11-20: qty 1

## 2011-11-20 MED ORDER — INSULIN ASPART 100 UNIT/ML ~~LOC~~ SOLN
0.0000 [IU] | SUBCUTANEOUS | Status: DC
  Administered 2011-11-21: 5 [IU] via SUBCUTANEOUS
  Administered 2011-11-21 (×3): 11 [IU] via SUBCUTANEOUS
  Administered 2011-11-21: 3 [IU] via SUBCUTANEOUS
  Administered 2011-11-21: 5 [IU] via SUBCUTANEOUS
  Administered 2011-11-22 (×2): 3 [IU] via SUBCUTANEOUS
  Administered 2011-11-22: 2 [IU] via SUBCUTANEOUS
  Filled 2011-11-20: qty 3

## 2011-11-20 MED ORDER — NOREPINEPHRINE BITARTRATE 1 MG/ML IJ SOLN
INTRAMUSCULAR | Status: AC
  Filled 2011-11-20: qty 4

## 2011-11-20 MED ORDER — SILVER SULFADIAZINE 1 % EX CREA
TOPICAL_CREAM | Freq: Two times a day (BID) | CUTANEOUS | Status: DC
  Administered 2011-11-20 – 2011-11-22 (×5): via TOPICAL
  Administered 2011-11-23: 1 via TOPICAL
  Administered 2011-11-23 – 2011-11-26 (×6): via TOPICAL
  Administered 2011-11-26: 1 via TOPICAL
  Administered 2011-11-27 – 2011-11-30 (×7): via TOPICAL
  Filled 2011-11-20 (×4): qty 50

## 2011-11-20 MED ORDER — VANCOMYCIN HCL 1000 MG IV SOLR
1500.0000 mg | INTRAVENOUS | Status: DC
  Filled 2011-11-20: qty 1500

## 2011-11-20 MED ORDER — POTASSIUM CHLORIDE 10 MEQ/100ML IV SOLN
10.0000 meq | INTRAVENOUS | Status: AC
  Administered 2011-11-20 – 2011-11-21 (×4): 10 meq via INTRAVENOUS
  Filled 2011-11-20: qty 100
  Filled 2011-11-20: qty 200

## 2011-11-20 MED ORDER — DEXTROSE 5 % IV SOLN
2.0000 ug/min | INTRAVENOUS | Status: AC
  Administered 2011-11-20: 30 ug/min via INTRAVENOUS
  Filled 2011-11-20: qty 4

## 2011-11-20 MED ORDER — PIPERACILLIN-TAZOBACTAM 3.375 G IVPB: 3.3750 g | Freq: Once | INTRAVENOUS | Status: DC

## 2011-11-20 MED ORDER — ASPIRIN 81 MG PO CHEW: 324.0000 mg | CHEWABLE_TABLET | ORAL | Status: AC

## 2011-11-20 MED ORDER — FOLIC ACID 5 MG/ML IJ SOLN
1.0000 mg | Freq: Every day | INTRAMUSCULAR | Status: DC
  Administered 2011-11-20 – 2011-11-24 (×5): 1 mg via INTRAVENOUS
  Filled 2011-11-20 (×5): qty 0.2

## 2011-11-20 MED ORDER — VANCOMYCIN HCL 1000 MG IV SOLR
2000.0000 mg | INTRAVENOUS | Status: AC
  Administered 2011-11-20: 2000 mg via INTRAVENOUS
  Filled 2011-11-20: qty 2000

## 2011-11-20 MED ORDER — PIPERACILLIN-TAZOBACTAM 4.5 G IVPB
4.5000 g | INTRAVENOUS | Status: AC
  Administered 2011-11-20: 4.5 g via INTRAVENOUS
  Filled 2011-11-20: qty 100

## 2011-11-20 MED ORDER — PIPERACILLIN-TAZOBACTAM IN DEX 2-0.25 GM/50ML IV SOLN
2.2500 g | Freq: Four times a day (QID) | INTRAVENOUS | Status: DC
  Administered 2011-11-20 – 2011-11-22 (×7): 2.25 g via INTRAVENOUS
  Filled 2011-11-20 (×10): qty 50

## 2011-11-20 MED ORDER — SODIUM CHLORIDE 0.9 % IV BOLUS (SEPSIS): 1000.0000 mL | Freq: Once | INTRAVENOUS | Status: DC

## 2011-11-20 MED ORDER — SILVER SULFADIAZINE 1 % EX CREA
TOPICAL_CREAM | Freq: Once | CUTANEOUS | Status: AC
  Administered 2011-11-20: 16:00:00 via TOPICAL
  Filled 2011-11-20: qty 85

## 2011-11-20 MED ORDER — SODIUM CHLORIDE 0.9 % IV SOLN
INTRAVENOUS | Status: AC
  Administered 2011-11-20 – 2011-11-21 (×2): 200 mL/h via INTRAVENOUS
  Administered 2011-11-21: 150 mL/h via INTRAVENOUS

## 2011-11-20 MED ORDER — HYDROCORTISONE SOD SUCCINATE 100 MG IJ SOLR
50.0000 mg | Freq: Four times a day (QID) | INTRAMUSCULAR | Status: AC
  Administered 2011-11-20 – 2011-11-21 (×5): 50 mg via INTRAVENOUS
  Filled 2011-11-20 (×5): qty 1

## 2011-11-20 MED ORDER — SODIUM CHLORIDE 0.9 % IV BOLUS (SEPSIS)
4000.0000 mL | Freq: Once | INTRAVENOUS | Status: AC
  Administered 2011-11-21: 4000 mL via INTRAVENOUS

## 2011-11-20 MED ORDER — NOREPINEPHRINE BITARTRATE 1 MG/ML IJ SOLN
INTRAMUSCULAR | Status: AC
  Administered 2011-11-20: 20 ug
  Filled 2011-11-20: qty 4

## 2011-11-20 MED ORDER — NOREPINEPHRINE BITARTRATE 1 MG/ML IJ SOLN
2.0000 ug/min | INTRAVENOUS | Status: DC
  Administered 2011-11-20: 30 ug/min via INTRAVENOUS
  Administered 2011-11-21: 40 ug/min via INTRAVENOUS
  Administered 2011-11-21: 35 ug/min via INTRAVENOUS
  Filled 2011-11-20 (×5): qty 8

## 2011-11-20 MED ORDER — THIAMINE HCL 100 MG/ML IJ SOLN
100.0000 mg | Freq: Every day | INTRAMUSCULAR | Status: DC
  Administered 2011-11-20 – 2011-11-24 (×5): 100 mg via INTRAVENOUS
  Filled 2011-11-20 (×5): qty 1

## 2011-11-20 MED ORDER — HEPARIN SODIUM (PORCINE) 5000 UNIT/ML IJ SOLN
5000.0000 [IU] | Freq: Three times a day (TID) | INTRAMUSCULAR | Status: DC
  Administered 2011-11-20 – 2011-11-30 (×31): 5000 [IU] via SUBCUTANEOUS
  Filled 2011-11-20 (×32): qty 1

## 2011-11-20 MED ORDER — ALBUTEROL (5 MG/ML) CONTINUOUS INHALATION SOLN
2.5000 mg/h | INHALATION_SOLUTION | RESPIRATORY_TRACT | Status: DC
  Filled 2011-11-20: qty 20

## 2011-11-20 MED ORDER — CLINDAMYCIN PHOSPHATE 900 MG/50ML IV SOLN: 900.0000 mg | Freq: Once | INTRAVENOUS | Status: DC
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
## 2011-11-20 NOTE — ED Notes (Signed)
Family at bedside. 

## 2011-11-20 NOTE — ED Notes (Addendum)
Gave report to [place], RN; informed Delight Hoh that orders have been released by physician and that NOW orders have been completed (aspirin suppository 300 mg).  Clindamycin has not been sent from pharmacy at this time.  Went through chart and orders with RN to give opportunity for questions.  Marcella, RN had no further questions.  Preparing patient for transport.

## 2011-11-20 NOTE — ED Notes (Signed)
Abnormal labs given to MD 
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
## 2011-11-20 NOTE — ED Notes (Signed)
Dr. Jill Alexanders aware of critical lab values.

## 2011-11-20 NOTE — ED Notes (Signed)
Patient reported to be found in floor by her brother.  Unsure of when patient fell.  She has hx of etoh.  Patient was found beside her bed.  Patient cbg [phone].

## 2011-11-20 NOTE — Progress Notes (Signed)
ANTIBIOTIC CONSULT NOTE - INITIAL  Pharmacy Consult for Vancomycin and Zosyn Indication: Empiric for sepsis  Allergies  Allergen Reactions  . Ace Inhibitors     REACTION: cough    Patient Measurements: Weight: 118.4kg on 09/01/11 Height: 157 cm on 09/01/11  Vital Signs: Temp: 99.9 F (37.7 C) (01/20 1815) BP: 112/87 mmHg (01/20 1730) Pulse Rate: 51  (01/20 1700) Intake/Output from previous day:   Intake/Output from this shift:    Labs:  Basename 11/20/11 1712 11/20/11 1521  WBC -- 12.3*  HGB 19.7* 16.7*  PLT -- 231  LABCREA -- --  CREATININE 4.10* 4.02*   The CrCl is unknown because both a height and weight (above a minimum accepted value) are required for this calculation. No results found for this basename: VANCOTROUGH:2,VANCOPEAK:2,VANCORANDOM:2,GENTTROUGH:2,GENTPEAK:2,GENTRANDOM:2,TOBRATROUGH:2,TOBRAPEAK:2,TOBRARND:2,AMIKACINPEAK:2,AMIKACINTROU:2,AMIKACIN:2, in the last 72 hours   Microbiology: No results found for this or any previous visit (from the past 720 hour(s)).  Medical History: Past Medical History  Diagnosis Date  . Hypertension   . Diabetes mellitus     NIDDM  . Obesities, morbid   . TIA (transient ischemic attack)   . Depression   . Degenerative joint disease   . ETOH abuse   . Cardiomyopathy     Medications:  Anti-infectives     Start     Dose/Rate Route Frequency Ordered Stop   11/20/11 1600   vancomycin (VANCOCIN) 2,000 mg in sodium chloride 0.9 % 500 mL IVPB        2,000 mg 250 mL/hr over 120 Minutes Intravenous [facility] 11/20/11 1521 11/20/11 1844   11/20/11 1600  piperacillin-tazobactam (ZOSYN) IVPB 4.5 g       4.5 g 200 mL/hr over 30 Minutes Intravenous [facility] 11/20/11 1527 11/20/11 1706   11/20/11 1530   piperacillin-tazobactam (ZOSYN) IVPB 3.375 g  Status:  Discontinued        3.375 g 12.5 mL/hr over 240 Minutes Intravenous  Once 11/20/11 1521 11/20/11 1527         Assessment: 66 year old  female admitted with AMS and septic shock to start empiric Vancomycin and Zosyn. Patient received 4.5g of Zosyn and 2g of Vancomycin in the ED at 1640pm. Strep pneumoniae and Legionella antigen pending. Blood and urine cultures pending. PCT pending. Hypotensive on levophed. Acute renal failure: SCr is 4.10. [place] [address]- possible rhabdo. WBC 12.3, Tm 99.9.   Goal of Therapy:  Vancomycin trough level 15-20 mcg/ml  Plan:  1.  As in acute renal failure, will start Vancomycin 1500mg  IV q48h - next dose not due until 1/22 at 1600.  2. Zosyn 2.25g IV q6h- next dose due today at 2200.  3. Follow-up cultures/sensitivities, renal function and clinical status.   Fayne Norrie, PharmD 11/20/2011,6:58 PM
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
## 2011-11-20 NOTE — ED Notes (Signed)
Calling report now. 

## 2011-11-20 NOTE — ED Notes (Signed)
Family at bedside. Crawford and Benjaman Kindler; brother and sister in law; (home) [phone] (cell) [phone]

## 2011-11-20 NOTE — ED Notes (Signed)
Vital signs stable. 

## 2011-11-20 NOTE — ED Notes (Signed)
Patient transported to CT 

## 2011-11-20 NOTE — ED Notes (Signed)
Patient being transported to 2100 with RN and tech; on portable cardiac, blood pressure, and O2 monitor.

## 2011-11-20 NOTE — ED Notes (Signed)
Patient returned from x-ray with RN; patient currently resting quietly; no respiratory or acute distress noted.  Will continue to monitor.

## 2011-11-20 NOTE — Significant Event (Signed)
CRITICAL VALUE ALERT  Critical value received:  K+= 2.4  Date of notification: 11/20/2011  Time of notification:  2036  Critical value read back:yes  Nurse who received alert: Berdine Dance  MD notified (1st page):  [email or web address]  Time of first page: 8:40 PM  MD notified (2nd page):  Time of second page:  Responding MD:  [email or web address]  Time MD responded:  8:41 PM

## 2011-11-20 NOTE — ED Notes (Signed)
Received bedside report from Ed, Charity fundraiser.  Patient currently sitting up in bed; no respiratory or acute distress noted.  Introduced self to patient and updated patient on plan of care; informed patient that a bed request has been put in and that we are currently waiting on a bed to become available.  Patient has no other questions or concerns at this time; will continue to monitor.
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
## 2011-11-21 ENCOUNTER — Inpatient Hospital Stay (HOSPITAL_COMMUNITY): Payer: Medicare Other

## 2011-11-21 DIAGNOSIS — A419 Sepsis, unspecified organism: Secondary | ICD-10-CM | POA: Diagnosis not present

## 2011-11-21 DIAGNOSIS — J9819 Other pulmonary collapse: Secondary | ICD-10-CM | POA: Diagnosis not present

## 2011-11-21 DIAGNOSIS — G9340 Encephalopathy, unspecified: Secondary | ICD-10-CM | POA: Diagnosis not present

## 2011-11-21 DIAGNOSIS — I517 Cardiomegaly: Secondary | ICD-10-CM | POA: Diagnosis not present

## 2011-11-21 DIAGNOSIS — R0602 Shortness of breath: Secondary | ICD-10-CM | POA: Diagnosis not present

## 2011-11-21 DIAGNOSIS — N179 Acute kidney failure, unspecified: Secondary | ICD-10-CM | POA: Diagnosis not present

## 2011-11-21 DIAGNOSIS — T2220XA Burn of second degree of shoulder and upper limb, except wrist and hand, unspecified site, initial encounter: Secondary | ICD-10-CM | POA: Diagnosis not present

## 2011-11-21 DIAGNOSIS — T300 Burn of unspecified body region, unspecified degree: Secondary | ICD-10-CM | POA: Diagnosis not present

## 2011-11-21 DIAGNOSIS — T23009A Burn of unspecified degree of unspecified hand, unspecified site, initial encounter: Secondary | ICD-10-CM | POA: Diagnosis not present

## 2011-11-21 DIAGNOSIS — R579 Shock, unspecified: Secondary | ICD-10-CM | POA: Diagnosis not present

## 2011-11-21 DIAGNOSIS — E871 Hypo-osmolality and hyponatremia: Secondary | ICD-10-CM | POA: Diagnosis not present

## 2011-11-21 LAB — CARDIAC PANEL(CRET KIN+CKTOT+MB+TROPI)
CK, MB: 88.5 ng/mL (ref 0.3–4.0)
CK, MB: 46.5 ng/mL (ref 0.3–4.0)
Relative Index: 0.6 (ref 0.0–2.5)
Relative Index: 0.5 (ref 0.0–2.5)
Total CK: 15437 U/L — ABNORMAL HIGH (ref 7–177)
Total CK: 9040 U/L — ABNORMAL HIGH (ref 7–177)
Troponin I: 0.34 ng/mL (ref <0.30)
Troponin I: <0.3 ng/mL (ref <0.30)

## 2011-11-21 LAB — CBC
HCT: 47.6 % — ABNORMAL HIGH (ref 36.0–46.0)
Hemoglobin: 17.5 g/dL — ABNORMAL HIGH (ref 12.0–15.0)
MCH: 32.1 pg (ref 26.0–34.0)
MCHC: 36.8 g/dL — ABNORMAL HIGH (ref 30.0–36.0)
MCV: 87.2 fL (ref 78.0–100.0)
Platelets: 244 10*3/uL (ref 150–400)
RBC: 5.46 MIL/uL — ABNORMAL HIGH (ref 3.87–5.11)
RDW: 14.4 % (ref 11.5–15.5)
WBC: 19.7 10*3/uL — ABNORMAL HIGH (ref 4.0–10.5)

## 2011-11-21 LAB — BLOOD GAS, ARTERIAL
Acid-base deficit: 10.4 mmol/L — ABNORMAL HIGH (ref 0.0–2.0)
Bicarbonate: 14.1 mEq/L — ABNORMAL LOW (ref 20.0–24.0)
Drawn by: 23604
O2 Content: 3 L/min
O2 Saturation: 96.4 %
Patient temperature: 97.3
TCO2: 14.9 mmol/L (ref 0–100)
pCO2 arterial: 25.3 mmHg — ABNORMAL LOW (ref 35.0–45.0)
pH, Arterial: 7.361 (ref 7.350–7.400)
pO2, Arterial: 87.3 mmHg (ref 80.0–100.0)

## 2011-11-21 LAB — PHOSPHORUS: Phosphorus: 5.2 mg/dL — ABNORMAL HIGH (ref 2.3–4.6)

## 2011-11-21 LAB — BASIC METABOLIC PANEL
BUN: 75 mg/dL — ABNORMAL HIGH (ref 6–23)
BUN: 77 mg/dL — ABNORMAL HIGH (ref 6–23)
CO2: 16 mEq/L — ABNORMAL LOW (ref 19–32)
CO2: 17 mEq/L — ABNORMAL LOW (ref 19–32)
Calcium: 7.7 mg/dL — ABNORMAL LOW (ref 8.4–10.5)
Calcium: 7.6 mg/dL — ABNORMAL LOW (ref 8.4–10.5)
Chloride: 89 mEq/L — ABNORMAL LOW (ref 96–112)
Chloride: 95 mEq/L — ABNORMAL LOW (ref 96–112)
Creatinine, Ser: 3.65 mg/dL — ABNORMAL HIGH (ref 0.50–1.10)
Creatinine, Ser: 3.44 mg/dL — ABNORMAL HIGH (ref 0.50–1.10)
GFR calc Af Amer: 14 mL/min — ABNORMAL LOW (ref >90)
GFR calc Af Amer: 15 mL/min — ABNORMAL LOW (ref >90)
GFR calc non Af Amer: 12 mL/min — ABNORMAL LOW (ref >90)
GFR calc non Af Amer: 13 mL/min — ABNORMAL LOW (ref >90)
Glucose, Bld: 366 mg/dL — ABNORMAL HIGH (ref 70–99)
Glucose, Bld: 224 mg/dL — ABNORMAL HIGH (ref 70–99)
Potassium: 3.2 mEq/L — ABNORMAL LOW (ref 3.5–5.1)
Potassium: 3.5 mEq/L (ref 3.5–5.1)
Sodium: 123 mEq/L — ABNORMAL LOW (ref 135–145)
Sodium: 126 mEq/L — ABNORMAL LOW (ref 135–145)

## 2011-11-21 LAB — GLUCOSE, CAPILLARY
Glucose-Capillary: 204 mg/dL — ABNORMAL HIGH (ref 70–99)
Glucose-Capillary: 245 mg/dL — ABNORMAL HIGH (ref 70–99)
Glucose-Capillary: 302 mg/dL — ABNORMAL HIGH (ref 70–99)
Glucose-Capillary: 323 mg/dL — ABNORMAL HIGH (ref 70–99)
Glucose-Capillary: 336 mg/dL — ABNORMAL HIGH (ref 70–99)

## 2011-11-21 LAB — MAGNESIUM: Magnesium: 2.1 mg/dL (ref 1.5–2.5)

## 2011-11-21 LAB — CORTISOL: Cortisol, Plasma: 41.9 ug/dL

## 2011-11-21 MED ORDER — ASPIRIN 81 MG PO CHEW
81.0000 mg | CHEWABLE_TABLET | Freq: Every day | ORAL | Status: DC
  Administered 2011-11-21 – 2011-11-30 (×10): 81 mg via ORAL
  Filled 2011-11-21 (×10): qty 1

## 2011-11-21 MED ORDER — SODIUM CHLORIDE 0.9 % IV SOLN
INTRAVENOUS | Status: DC
  Administered 2011-11-22: 50 mL/h via INTRAVENOUS
  Administered 2011-11-22: via INTRAVENOUS
  Administered 2011-11-24: 50 mL/h via INTRAVENOUS
  Administered 2011-11-25 – 2011-11-26 (×2): via INTRAVENOUS

## 2011-11-21 MED ORDER — ADULT MULTIVITAMIN W/MINERALS CH
1.0000 | ORAL_TABLET | Freq: Every day | ORAL | Status: DC
  Administered 2011-11-21 – 2011-11-30 (×10): 1 via ORAL
  Filled 2011-11-21 (×10): qty 1

## 2011-11-21 MED ORDER — POTASSIUM CHLORIDE 10 MEQ/50ML IV SOLN
INTRAVENOUS | Status: AC
  Filled 2011-11-21: qty 100

## 2011-11-21 MED ORDER — SODIUM CHLORIDE 0.9 % IV SOLN
2.0000 ug/min | INTRAVENOUS | Status: DC
  Administered 2011-11-21: 20 ug/min via INTRAVENOUS
  Filled 2011-11-21 (×3): qty 8

## 2011-11-21 MED ORDER — POTASSIUM CHLORIDE 10 MEQ/50ML IV SOLN
10.0000 meq | INTRAVENOUS | Status: AC
  Administered 2011-11-21 (×3): 10 meq via INTRAVENOUS
  Filled 2011-11-21: qty 100

## 2011-11-21 MED ORDER — LORAZEPAM 1 MG PO TABS: 1.0000 mg | ORAL_TABLET | Freq: Four times a day (QID) | ORAL | Status: AC | PRN

## 2011-11-21 MED ORDER — SODIUM CHLORIDE 0.9 % IJ SOLN
INTRAMUSCULAR | Status: AC
  Administered 2011-11-21: 10 mL
  Filled 2011-11-21: qty 20

## 2011-11-21 MED ORDER — SODIUM CHLORIDE 0.9 % IJ SOLN
INTRAMUSCULAR | Status: AC
  Filled 2011-11-21: qty 40

## 2011-11-21 MED ORDER — SODIUM CHLORIDE 0.9 % IJ SOLN
INTRAMUSCULAR | Status: AC
  Administered 2011-11-21: 10 mL
  Filled 2011-11-21: qty 10

## 2011-11-21 MED ORDER — LORAZEPAM 2 MG/ML IJ SOLN: 1.0000 mg | Freq: Four times a day (QID) | INTRAMUSCULAR | Status: AC | PRN

## 2011-11-21 MED ORDER — POTASSIUM CHLORIDE 10 MEQ/50ML IV SOLN
10.0000 meq | Freq: Once | INTRAVENOUS | Status: AC
  Administered 2011-11-21: 10 meq via INTRAVENOUS

## 2011-11-21 NOTE — Progress Notes (Signed)
Inpatient Diabetes Program Recommendations  AACE/ADA: New Consensus Statement on Inpatient Glycemic Control (2009)  Target Ranges:  Prepandial:   less than 140 mg/dL      Peak postprandial:   less than 180 mg/dL (1-2 hours)      Critically ill patients:  140 - 180 mg/dL   Reason for Visit: Elevated glucose in 300s  Inpatient Diabetes Program Recommendations Insulin - Basal: Add Lantus 20 units daily while on IV steroids

## 2011-11-21 NOTE — Progress Notes (Signed)
UR Completed.  Allison Caldwell Jane [phone] 11/21/2011  
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
## 2011-11-21 NOTE — Progress Notes (Signed)
Chaplain Note:  Chaplain introduced Allison Caldwell to pt and family and was invited into pt's room.  Chaplain provided spiritual comfort and support for pt and family.  Pt and family appreciated chaplain support. Chaplain will follow up as requested.  11/21/11 1400  Clinical Encounter Type  Visited With Patient and family together  Visit Type Spiritual support;Initial  Spiritual Encounters  Spiritual Needs Emotional  Stress Factors  Patient Stress Factors Health changes  Family Stress Factors None identified   [name], chaplain resident [phone]
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
## 2011-11-21 NOTE — Progress Notes (Signed)
Patient's urine has been decreasing.  Dr. Molli Knock advised, no orders given.

## 2011-11-21 NOTE — Progress Notes (Signed)
Clinical Social Worker met with pt and family and completed psychosocial assessment; please see shadow chart for details.  CSW to follow acutely.  Angelia Mould, MSW, [place] [phone]

## 2011-11-22 DIAGNOSIS — T23009A Burn of unspecified degree of unspecified hand, unspecified site, initial encounter: Secondary | ICD-10-CM | POA: Diagnosis not present

## 2011-11-22 DIAGNOSIS — T2220XA Burn of second degree of shoulder and upper limb, except wrist and hand, unspecified site, initial encounter: Secondary | ICD-10-CM | POA: Diagnosis not present

## 2011-11-22 DIAGNOSIS — E871 Hypo-osmolality and hyponatremia: Secondary | ICD-10-CM | POA: Diagnosis not present

## 2011-11-22 DIAGNOSIS — N179 Acute kidney failure, unspecified: Secondary | ICD-10-CM | POA: Diagnosis not present

## 2011-11-22 DIAGNOSIS — G9340 Encephalopathy, unspecified: Secondary | ICD-10-CM | POA: Diagnosis not present

## 2011-11-22 DIAGNOSIS — A419 Sepsis, unspecified organism: Secondary | ICD-10-CM | POA: Diagnosis not present

## 2011-11-22 DIAGNOSIS — T300 Burn of unspecified body region, unspecified degree: Secondary | ICD-10-CM | POA: Diagnosis not present

## 2011-11-22 DIAGNOSIS — R579 Shock, unspecified: Secondary | ICD-10-CM | POA: Diagnosis not present

## 2011-11-22 LAB — BASIC METABOLIC PANEL
BUN: 72 mg/dL — ABNORMAL HIGH (ref 6–23)
CO2: 17 mEq/L — ABNORMAL LOW (ref 19–32)
Calcium: 7.8 mg/dL — ABNORMAL LOW (ref 8.4–10.5)
Chloride: 98 mEq/L (ref 96–112)
Creatinine, Ser: 2.79 mg/dL — ABNORMAL HIGH (ref 0.50–1.10)
GFR calc Af Amer: 19 mL/min — ABNORMAL LOW (ref >90)
GFR calc non Af Amer: 16 mL/min — ABNORMAL LOW (ref >90)
Glucose, Bld: 172 mg/dL — ABNORMAL HIGH (ref 70–99)
Potassium: 3.1 mEq/L — ABNORMAL LOW (ref 3.5–5.1)
Sodium: 129 mEq/L — ABNORMAL LOW (ref 135–145)

## 2011-11-22 LAB — CARDIAC PANEL(CRET KIN+CKTOT+MB+TROPI)
CK, MB: 27.5 ng/mL (ref 0.3–4.0)
Relative Index: 0.5 (ref 0.0–2.5)
Total CK: 5782 U/L — ABNORMAL HIGH (ref 7–177)
Troponin I: <0.3 ng/mL (ref <0.30)

## 2011-11-22 LAB — NA AND K (SODIUM & POTASSIUM), RAND UR
Potassium Urine: 23 mEq/L
Sodium, Ur: 10 mEq/L

## 2011-11-22 LAB — COMPREHENSIVE METABOLIC PANEL
ALT: 64 U/L — ABNORMAL HIGH (ref 0–35)
AST: 118 U/L — ABNORMAL HIGH (ref 0–37)
Albumin: 2 g/dL — ABNORMAL LOW (ref 3.5–5.2)
Alkaline Phosphatase: 38 U/L — ABNORMAL LOW (ref 39–117)
BUN: 71 mg/dL — ABNORMAL HIGH (ref 6–23)
CO2: 17 mEq/L — ABNORMAL LOW (ref 19–32)
Calcium: 7.9 mg/dL — ABNORMAL LOW (ref 8.4–10.5)
Chloride: 96 mEq/L (ref 96–112)
Creatinine, Ser: 3.03 mg/dL — ABNORMAL HIGH (ref 0.50–1.10)
GFR calc Af Amer: 17 mL/min — ABNORMAL LOW (ref >90)
GFR calc non Af Amer: 15 mL/min — ABNORMAL LOW (ref >90)
Glucose, Bld: 173 mg/dL — ABNORMAL HIGH (ref 70–99)
Potassium: 3.2 mEq/L — ABNORMAL LOW (ref 3.5–5.1)
Sodium: 126 mEq/L — ABNORMAL LOW (ref 135–145)
Total Bilirubin: 0.7 mg/dL (ref 0.3–1.2)
Total Protein: 4.8 g/dL — ABNORMAL LOW (ref 6.0–8.3)

## 2011-11-22 LAB — GLUCOSE, CAPILLARY
Glucose-Capillary: 120 mg/dL — ABNORMAL HIGH (ref 70–99)
Glucose-Capillary: 127 mg/dL — ABNORMAL HIGH (ref 70–99)
Glucose-Capillary: 168 mg/dL — ABNORMAL HIGH (ref 70–99)
Glucose-Capillary: 172 mg/dL — ABNORMAL HIGH (ref 70–99)
Glucose-Capillary: 180 mg/dL — ABNORMAL HIGH (ref 70–99)
Glucose-Capillary: 181 mg/dL — ABNORMAL HIGH (ref 70–99)
Glucose-Capillary: 183 mg/dL — ABNORMAL HIGH (ref 70–99)

## 2011-11-22 LAB — OSMOLALITY, URINE: Osmolality, Ur: 412 mOsm/kg (ref 390–1090)

## 2011-11-22 LAB — CBC
HCT: 36.6 % (ref 36.0–46.0)
Hemoglobin: 13.5 g/dL (ref 12.0–15.0)
MCH: 31.6 pg (ref 26.0–34.0)
MCHC: 36.9 g/dL — ABNORMAL HIGH (ref 30.0–36.0)
MCV: 85.7 fL (ref 78.0–100.0)
Platelets: 165 10*3/uL (ref 150–400)
RBC: 4.27 MIL/uL (ref 3.87–5.11)
RDW: 14.1 % (ref 11.5–15.5)
WBC: 14 10*3/uL — ABNORMAL HIGH (ref 4.0–10.5)

## 2011-11-22 LAB — OSMOLALITY: Osmolality: 291 mOsm/kg (ref 275–300)

## 2011-11-22 LAB — PHOSPHORUS: Phosphorus: 4.6 mg/dL (ref 2.3–4.6)

## 2011-11-22 LAB — CHLORIDE, URINE, RANDOM: Chloride Urine: <25 mEq/L

## 2011-11-22 LAB — TSH: TSH: 0.864 u[IU]/mL (ref 0.350–4.500)

## 2011-11-22 MED ORDER — SODIUM CHLORIDE 0.9 % IJ SOLN
INTRAMUSCULAR | Status: AC
  Filled 2011-11-22: qty 20

## 2011-11-22 MED ORDER — POTASSIUM CHLORIDE CRYS ER 20 MEQ PO TBCR
40.0000 meq | EXTENDED_RELEASE_TABLET | Freq: Once | ORAL | Status: AC
  Administered 2011-11-22: 40 meq via ORAL
  Filled 2011-11-22: qty 2

## 2011-11-22 MED ORDER — PIPERACILLIN-TAZOBACTAM 3.375 G IVPB
3.3750 g | Freq: Three times a day (TID) | INTRAVENOUS | Status: DC
  Administered 2011-11-22 – 2011-11-24 (×5): 3.375 g via INTRAVENOUS
  Filled 2011-11-22 (×9): qty 50

## 2011-11-22 MED ORDER — INSULIN ASPART 100 UNIT/ML ~~LOC~~ SOLN
0.0000 [IU] | Freq: Three times a day (TID) | SUBCUTANEOUS | Status: DC
Start: 2011-11-22 — End: 2011-11-23
  Administered 2011-11-22 – 2011-11-23 (×3): 3 [IU] via SUBCUTANEOUS

## 2011-11-22 MED ORDER — ACETAMINOPHEN 325 MG PO TABS
650.0000 mg | ORAL_TABLET | ORAL | Status: DC | PRN
  Administered 2011-11-22 – 2011-11-23 (×2): 650 mg via ORAL
  Filled 2011-11-22 (×2): qty 2
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
## 2011-11-23 DIAGNOSIS — R579 Shock, unspecified: Secondary | ICD-10-CM | POA: Diagnosis not present

## 2011-11-23 DIAGNOSIS — N179 Acute kidney failure, unspecified: Secondary | ICD-10-CM | POA: Diagnosis not present

## 2011-11-23 DIAGNOSIS — T300 Burn of unspecified body region, unspecified degree: Secondary | ICD-10-CM | POA: Diagnosis not present

## 2011-11-23 DIAGNOSIS — T23009A Burn of unspecified degree of unspecified hand, unspecified site, initial encounter: Secondary | ICD-10-CM | POA: Diagnosis not present

## 2011-11-23 DIAGNOSIS — F329 Major depressive disorder, single episode, unspecified: Secondary | ICD-10-CM | POA: Diagnosis not present

## 2011-11-23 DIAGNOSIS — A419 Sepsis, unspecified organism: Secondary | ICD-10-CM | POA: Diagnosis not present

## 2011-11-23 DIAGNOSIS — E871 Hypo-osmolality and hyponatremia: Secondary | ICD-10-CM | POA: Diagnosis not present

## 2011-11-23 LAB — CBC
HCT: 36.1 % (ref 36.0–46.0)
Hemoglobin: 12.8 g/dL (ref 12.0–15.0)
MCH: 31.1 pg (ref 26.0–34.0)
MCHC: 35.5 g/dL (ref 30.0–36.0)
MCV: 87.8 fL (ref 78.0–100.0)
Platelets: 185 10*3/uL (ref 150–400)
RBC: 4.11 MIL/uL (ref 3.87–5.11)
RDW: 14.6 % (ref 11.5–15.5)
WBC: 10.4 10*3/uL (ref 4.0–10.5)

## 2011-11-23 LAB — COMPREHENSIVE METABOLIC PANEL
ALT: 57 U/L — ABNORMAL HIGH (ref 0–35)
AST: 81 U/L — ABNORMAL HIGH (ref 0–37)
Albumin: 2 g/dL — ABNORMAL LOW (ref 3.5–5.2)
Alkaline Phosphatase: 40 U/L (ref 39–117)
BUN: 62 mg/dL — ABNORMAL HIGH (ref 6–23)
CO2: 20 mEq/L (ref 19–32)
Calcium: 8.1 mg/dL — ABNORMAL LOW (ref 8.4–10.5)
Chloride: 103 mEq/L (ref 96–112)
Creatinine, Ser: 2.22 mg/dL — ABNORMAL HIGH (ref 0.50–1.10)
GFR calc Af Amer: 25 mL/min — ABNORMAL LOW (ref >90)
GFR calc non Af Amer: 22 mL/min — ABNORMAL LOW (ref >90)
Glucose, Bld: 162 mg/dL — ABNORMAL HIGH (ref 70–99)
Potassium: 3.5 mEq/L (ref 3.5–5.1)
Sodium: 135 mEq/L (ref 135–145)
Total Bilirubin: 0.4 mg/dL (ref 0.3–1.2)
Total Protein: 4.9 g/dL — ABNORMAL LOW (ref 6.0–8.3)

## 2011-11-23 LAB — GLUCOSE, CAPILLARY
Glucose-Capillary: 155 mg/dL — ABNORMAL HIGH (ref 70–99)
Glucose-Capillary: 169 mg/dL — ABNORMAL HIGH (ref 70–99)
Glucose-Capillary: 213 mg/dL — ABNORMAL HIGH (ref 70–99)

## 2011-11-23 MED ORDER — OXYCODONE HCL 5 MG PO TABS
5.0000 mg | ORAL_TABLET | ORAL | Status: DC | PRN
  Administered 2011-11-23 – 2011-11-26 (×6): 5 mg via ORAL
  Filled 2011-11-23 (×6): qty 1

## 2011-11-23 MED ORDER — PREDNISONE 50 MG PO TABS
50.0000 mg | ORAL_TABLET | Freq: Every day | ORAL | Status: AC
  Administered 2011-11-23 – 2011-11-26 (×4): 50 mg via ORAL
  Filled 2011-11-23 (×5): qty 1

## 2011-11-23 MED ORDER — SIMVASTATIN 20 MG PO TABS: 20.0000 mg | ORAL_TABLET | Freq: Every day | ORAL | Status: DC

## 2011-11-23 MED ORDER — INSULIN ASPART 100 UNIT/ML ~~LOC~~ SOLN
0.0000 [IU] | Freq: Three times a day (TID) | SUBCUTANEOUS | Status: DC
  Administered 2011-11-23: 5 [IU] via SUBCUTANEOUS
  Administered 2011-11-23: 3 [IU] via SUBCUTANEOUS
  Administered 2011-11-24: 5 [IU] via SUBCUTANEOUS
  Administered 2011-11-24: 8 [IU] via SUBCUTANEOUS
  Administered 2011-11-24: 3 [IU] via SUBCUTANEOUS
  Administered 2011-11-24: 8 [IU] via SUBCUTANEOUS
  Administered 2011-11-25 (×2): 3 [IU] via SUBCUTANEOUS
  Filled 2011-11-23 (×2): qty 3
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
## 2011-11-23 NOTE — Progress Notes (Signed)
No changes in management today.  This patient has been seen and I agree with the findings and treatment plan.  Marta Lamas. Gae Bon, MD, FACS [phone] (pager) [phone] (direct pager) Trauma Surgeon
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
## 2011-11-23 NOTE — Progress Notes (Signed)
Clinical Social Worker staffed case with [employer].  CSW phoned pt's apartment complex [address] Allison Caldwell, [place] #[phone]).  Claris Che indicated that pt's sister in law had visited the office yesterday to report that pt had passed away and wanted to get into her apartment.  Claris Che reports that access was denied as they did not have permission.  Claris Che agreed to look in apartment and phone CSW back if there is anything out of the ordinary.  Claris Che did collaborate that there is a Consulting civil engineer named [name].  Claris Che reports that pt is pleasant and is close with her neighbor but could not think of anything that seemed out of the ordinary with pt.  CSW to continue to follow and assist as needed.   Angelia Mould, MSW, [place] [phone]
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
## 2011-11-24 DIAGNOSIS — R579 Shock, unspecified: Secondary | ICD-10-CM | POA: Diagnosis not present

## 2011-11-24 DIAGNOSIS — R6521 Severe sepsis with septic shock: Secondary | ICD-10-CM | POA: Diagnosis not present

## 2011-11-24 DIAGNOSIS — T300 Burn of unspecified body region, unspecified degree: Secondary | ICD-10-CM | POA: Diagnosis not present

## 2011-11-24 DIAGNOSIS — N179 Acute kidney failure, unspecified: Secondary | ICD-10-CM | POA: Diagnosis not present

## 2011-11-24 DIAGNOSIS — E871 Hypo-osmolality and hyponatremia: Secondary | ICD-10-CM | POA: Diagnosis not present

## 2011-11-24 DIAGNOSIS — A419 Sepsis, unspecified organism: Secondary | ICD-10-CM | POA: Diagnosis not present

## 2011-11-24 DIAGNOSIS — L259 Unspecified contact dermatitis, unspecified cause: Secondary | ICD-10-CM | POA: Diagnosis not present

## 2011-11-24 DIAGNOSIS — T23009A Burn of unspecified degree of unspecified hand, unspecified site, initial encounter: Secondary | ICD-10-CM | POA: Diagnosis not present

## 2011-11-24 LAB — CBC
HCT: 32.6 % — ABNORMAL LOW (ref 36.0–46.0)
Hemoglobin: 11.6 g/dL — ABNORMAL LOW (ref 12.0–15.0)
MCH: 31.3 pg (ref 26.0–34.0)
MCHC: 35.6 g/dL (ref 30.0–36.0)
MCV: 87.9 fL (ref 78.0–100.0)
Platelets: 206 10*3/uL (ref 150–400)
RBC: 3.71 MIL/uL — ABNORMAL LOW (ref 3.87–5.11)
RDW: 14.8 % (ref 11.5–15.5)
WBC: 11.4 10*3/uL — ABNORMAL HIGH (ref 4.0–10.5)

## 2011-11-24 LAB — COMPREHENSIVE METABOLIC PANEL
ALT: 51 U/L — ABNORMAL HIGH (ref 0–35)
AST: 59 U/L — ABNORMAL HIGH (ref 0–37)
Albumin: 2 g/dL — ABNORMAL LOW (ref 3.5–5.2)
Alkaline Phosphatase: 41 U/L (ref 39–117)
BUN: 43 mg/dL — ABNORMAL HIGH (ref 6–23)
CO2: 19 mEq/L (ref 19–32)
Calcium: 8.7 mg/dL (ref 8.4–10.5)
Chloride: 103 mEq/L (ref 96–112)
Creatinine, Ser: 1.49 mg/dL — ABNORMAL HIGH (ref 0.50–1.10)
GFR calc Af Amer: 41 mL/min — ABNORMAL LOW (ref >90)
GFR calc non Af Amer: 35 mL/min — ABNORMAL LOW (ref >90)
Glucose, Bld: 174 mg/dL — ABNORMAL HIGH (ref 70–99)
Potassium: 4 mEq/L (ref 3.5–5.1)
Sodium: 135 mEq/L (ref 135–145)
Total Bilirubin: 0.4 mg/dL (ref 0.3–1.2)
Total Protein: 5.3 g/dL — ABNORMAL LOW (ref 6.0–8.3)

## 2011-11-24 LAB — GLUCOSE, CAPILLARY
Glucose-Capillary: 178 mg/dL — ABNORMAL HIGH (ref 70–99)
Glucose-Capillary: 158 mg/dL — ABNORMAL HIGH (ref 70–99)
Glucose-Capillary: 224 mg/dL — ABNORMAL HIGH (ref 70–99)
Glucose-Capillary: 291 mg/dL — ABNORMAL HIGH (ref 70–99)
Glucose-Capillary: 264 mg/dL — ABNORMAL HIGH (ref 70–99)

## 2011-11-24 LAB — CK: Total CK: 2215 U/L — ABNORMAL HIGH (ref 7–177)

## 2011-11-24 MED ORDER — VITAMIN B-1 100 MG PO TABS
100.0000 mg | ORAL_TABLET | Freq: Every day | ORAL | Status: DC
  Administered 2011-11-25 – 2011-11-30 (×6): 100 mg via ORAL
  Filled 2011-11-24 (×6): qty 1

## 2011-11-24 MED ORDER — FOLIC ACID 1 MG PO TABS
1.0000 mg | ORAL_TABLET | Freq: Every day | ORAL | Status: DC
  Administered 2011-11-25 – 2011-11-30 (×6): 1 mg via ORAL
  Filled 2011-11-24 (×6): qty 1

## 2011-11-24 MED ORDER — CALCIUM CARBONATE ANTACID 500 MG PO CHEW
1.0000 | CHEWABLE_TABLET | Freq: Three times a day (TID) | ORAL | Status: DC | PRN
  Administered 2011-11-24 – 2011-11-29 (×4): 200 mg via ORAL
  Filled 2011-11-24 (×5): qty 1
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
## 2011-11-24 NOTE — Progress Notes (Signed)
Called Pharmacy and tried to explain that the 1400 dose of Zosyn had just been restarted approximately 30 minutes before because IV access was lost right after Zosyn hung. He could not move time and asked me to chart 1400 dose as not given. I couldn't change another nurses charting. Charted 2200 dose as not given.
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
## 2011-11-24 NOTE — Progress Notes (Signed)
Thayden Lemire, MD, MPH, FACS Pager: [phone]  
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
## 2011-11-24 NOTE — Progress Notes (Signed)
PHARMACIST - PHYSICIAN COMMUNICATION DR:  Coralee Pesa CONCERNING: IV to Oral Route Change Policy  RECOMMENDATION: This patient is receiving folic acid & thiamine by the intravenous route.  Based on criteria approved by the Pharmacy and Therapeutics Committee, these are being converted to the equivalent oral dose form.   DESCRIPTION: These criteria include:  The patient is eating (either orally or via tube) and/or has been taking other orally administered medications for a least 24 hours  If you have questions about this conversion, please contact the Pharmacy Department  []   [phone] )  [facility] [x]   [phone] )  [facility]  []   [phone] )  [facility] []   [phone] )  [facility]   Georgina Pillion, PharmD, BCPS 11/24/2011 12:18 PM
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
## 2011-11-25 LAB — CBC
HCT: 30.8 % — ABNORMAL LOW (ref 36.0–46.0)
Hemoglobin: 10.8 g/dL — ABNORMAL LOW (ref 12.0–15.0)
MCH: 31.8 pg (ref 26.0–34.0)
MCHC: 35.1 g/dL (ref 30.0–36.0)
MCV: 90.6 fL (ref 78.0–100.0)
Platelets: 243 10*3/uL (ref 150–400)
RBC: 3.4 MIL/uL — ABNORMAL LOW (ref 3.87–5.11)
RDW: 15.4 % (ref 11.5–15.5)
WBC: 9.6 10*3/uL (ref 4.0–10.5)

## 2011-11-25 LAB — GLUCOSE, CAPILLARY
Glucose-Capillary: 162 mg/dL — ABNORMAL HIGH (ref 70–99)
Glucose-Capillary: 165 mg/dL — ABNORMAL HIGH (ref 70–99)
Glucose-Capillary: 200 mg/dL — ABNORMAL HIGH (ref 70–99)
Glucose-Capillary: 223 mg/dL — ABNORMAL HIGH (ref 70–99)

## 2011-11-25 LAB — DIFFERENTIAL
Basophils Absolute: 0 10*3/uL (ref 0.0–0.1)
Basophils Relative: 0 % (ref 0–1)
Eosinophils Absolute: 0 10*3/uL (ref 0.0–0.7)
Eosinophils Relative: 0 % (ref 0–5)
Lymphocytes Relative: 17 % (ref 12–46)
Lymphs Abs: 1.7 10*3/uL (ref 0.7–4.0)
Monocytes Absolute: 1.6 10*3/uL — ABNORMAL HIGH (ref 0.1–1.0)
Monocytes Relative: 17 % — ABNORMAL HIGH (ref 3–12)
Neutro Abs: 6.3 10*3/uL (ref 1.7–7.7)
Neutrophils Relative %: 66 % (ref 43–77)

## 2011-11-25 LAB — COMPREHENSIVE METABOLIC PANEL
ALT: 45 U/L — ABNORMAL HIGH (ref 0–35)
AST: 40 U/L — ABNORMAL HIGH (ref 0–37)
Albumin: 2.1 g/dL — ABNORMAL LOW (ref 3.5–5.2)
Alkaline Phosphatase: 40 U/L (ref 39–117)
BUN: 32 mg/dL — ABNORMAL HIGH (ref 6–23)
CO2: 24 mEq/L (ref 19–32)
Calcium: 8.5 mg/dL (ref 8.4–10.5)
Chloride: 105 mEq/L (ref 96–112)
Creatinine, Ser: 1.17 mg/dL — ABNORMAL HIGH (ref 0.50–1.10)
GFR calc Af Amer: 55 mL/min — ABNORMAL LOW (ref >90)
GFR calc non Af Amer: 47 mL/min — ABNORMAL LOW (ref >90)
Glucose, Bld: 178 mg/dL — ABNORMAL HIGH (ref 70–99)
Potassium: 3.5 mEq/L (ref 3.5–5.1)
Sodium: 139 mEq/L (ref 135–145)
Total Bilirubin: 0.3 mg/dL (ref 0.3–1.2)
Total Protein: 5.3 g/dL — ABNORMAL LOW (ref 6.0–8.3)

## 2011-11-25 MED ORDER — INSULIN ASPART 100 UNIT/ML ~~LOC~~ SOLN
4.0000 [IU] | Freq: Three times a day (TID) | SUBCUTANEOUS | Status: DC
  Administered 2011-11-25 – 2011-11-28 (×9): 4 [IU] via SUBCUTANEOUS

## 2011-11-25 MED ORDER — INSULIN ASPART 100 UNIT/ML ~~LOC~~ SOLN
0.0000 [IU] | Freq: Every day | SUBCUTANEOUS | Status: DC
  Administered 2011-11-25: 2 [IU] via SUBCUTANEOUS
  Administered 2011-11-26: 3 [IU] via SUBCUTANEOUS
  Administered 2011-11-27: 4 [IU] via SUBCUTANEOUS
  Administered 2011-11-28 – 2011-11-29 (×2): 3 [IU] via SUBCUTANEOUS

## 2011-11-25 MED ORDER — CARVEDILOL 25 MG PO TABS
25.0000 mg | ORAL_TABLET | Freq: Two times a day (BID) | ORAL | Status: DC
  Administered 2011-11-25 – 2011-11-30 (×10): 25 mg via ORAL
  Filled 2011-11-25 (×13): qty 1

## 2011-11-25 MED ORDER — INSULIN ASPART 100 UNIT/ML ~~LOC~~ SOLN
0.0000 [IU] | Freq: Three times a day (TID) | SUBCUTANEOUS | Status: DC
  Administered 2011-11-25: 3 [IU] via SUBCUTANEOUS
  Administered 2011-11-26: 8 [IU] via SUBCUTANEOUS
  Administered 2011-11-26: 2 [IU] via SUBCUTANEOUS
  Administered 2011-11-26: 4 [IU] via SUBCUTANEOUS
  Administered 2011-11-27: 5 [IU] via SUBCUTANEOUS
  Administered 2011-11-27: 11 [IU] via SUBCUTANEOUS
  Administered 2011-11-27: 3 [IU] via SUBCUTANEOUS
  Administered 2011-11-28: 15 [IU] via SUBCUTANEOUS
  Administered 2011-11-28 – 2011-11-29 (×4): 5 [IU] via SUBCUTANEOUS
  Administered 2011-11-30: 3 [IU] via SUBCUTANEOUS
  Administered 2011-11-30: 8 [IU] via SUBCUTANEOUS

## 2011-11-25 NOTE — Plan of Care (Signed)
Problem: Phase II Progression Outcomes Goal: Progress activity as tolerated unless otherwise ordered Pt sitting EOB with OT today

## 2011-11-25 NOTE — Consult Note (Signed)
Reason for Consult:Diagnosis and medications Referring Physician: Dr. Roxanne Gates Toman is an 66 y.o. female.  HPI: Pt was found down, with R arm pressure bullae after lying so long on arm.  She has a history of drinking alcohol, complex medical problems, She has been sedated with pain medication and unable to provide more history.  Her remarks about the evening before she lost consciousness were bizarre and very limited in detail.   Past Medical History  Diagnosis Date  . Hypertension   . Diabetes mellitus     NIDDM  . Obesities, morbid   . TIA (transient ischemic attack)   . Depression   . Degenerative joint disease   . ETOH abuse   . Cardiomyopathy     Past Surgical History  Procedure Date  . Orif ankle fracture     right  . Cesarean section     times 2  . Eye surgery     laser - right eye 2010  . Left wrist     surgical repair    Family History  Problem Relation Age of Onset  . Heart disease Mother   . Cancer Father   . Diabetes Sister   . Kidney disease Brother   . Stroke Brother     Social History:  reports that she has quit smoking. Her smoking use included Cigarettes. She smoked .5 packs per day. She does not have any smokeless tobacco history on file. She reports that she drinks alcohol. She reports that she does not use illicit drugs.  Allergies:  Allergies  Allergen Reactions  . Ace Inhibitors     REACTION: cough    Medications: I have reviewed the patient's current medications.  Results for orders placed during the hospital encounter of 11/20/11 (from the past 48 hour(s))  GLUCOSE, CAPILLARY     Status: Abnormal   Collection Time   11/23/11  9:43 PM      Component Value Range Comment   Glucose-Capillary 178 (*) 70 - 99 (mg/dL)   COMPREHENSIVE METABOLIC PANEL     Status: Abnormal   Collection Time   11/24/11  5:55 AM      Component Value Range Comment   Sodium 135  135 - 145 (mEq/L)    Potassium 4.0  3.5 - 5.1 (mEq/L)    Chloride 103  96 - 112  (mEq/L)    CO2 19  19 - 32 (mEq/L)    Glucose, Bld 174 (*) 70 - 99 (mg/dL)    BUN 43 (*) 6 - 23 (mg/dL)    Creatinine, Ser 4.69 (*) 0.50 - 1.10 (mg/dL)    Calcium 8.7  8.4 - 10.5 (mg/dL)    Total Protein 5.3 (*) 6.0 - 8.3 (g/dL)    Albumin 2.0 (*) 3.5 - 5.2 (g/dL)    AST 59 (*) 0 - 37 (U/L)    ALT 51 (*) 0 - 35 (U/L)    Alkaline Phosphatase 41  39 - 117 (U/L)    Total Bilirubin 0.4  0.3 - 1.2 (mg/dL)    GFR calc non Af Amer 35 (*) >90 (mL/min)    GFR calc Af Amer 41 (*) >90 (mL/min)   CBC     Status: Abnormal   Collection Time   11/24/11  5:55 AM      Component Value Range Comment   WBC 11.4 (*) 4.0 - 10.5 (K/uL)    RBC 3.71 (*) 3.87 - 5.11 (MIL/uL)    Hemoglobin 11.6 (*) 12.0 - 15.0 (  g/dL)    HCT 16.1 (*) 09.6 - 46.0 (%)    MCV 87.9  78.0 - 100.0 (fL)    MCH 31.3  26.0 - 34.0 (pg)    MCHC 35.6  30.0 - 36.0 (g/dL)    RDW 04.5  40.9 - 81.1 (%)    Platelets 206  150 - 400 (K/uL)   CK     Status: Abnormal   Collection Time   11/24/11  5:55 AM      Component Value Range Comment   Total CK 2215 (*) 7 - 177 (U/L)   GLUCOSE, CAPILLARY     Status: Abnormal   Collection Time   11/24/11  8:06 AM      Component Value Range Comment   Glucose-Capillary 158 (*) 70 - 99 (mg/dL)   GLUCOSE, CAPILLARY     Status: Abnormal   Collection Time   11/24/11 11:32 AM      Component Value Range Comment   Glucose-Capillary 224 (*) 70 - 99 (mg/dL)   GLUCOSE, CAPILLARY     Status: Abnormal   Collection Time   11/24/11  4:16 PM      Component Value Range Comment   Glucose-Capillary 291 (*) 70 - 99 (mg/dL)   GLUCOSE, CAPILLARY     Status: Abnormal   Collection Time   11/24/11 10:13 PM      Component Value Range Comment   Glucose-Capillary 264 (*) 70 - 99 (mg/dL)    Comment 1 Documented in Chart      Comment 2 Notify RN     COMPREHENSIVE METABOLIC PANEL     Status: Abnormal   Collection Time   11/25/11  5:40 AM      Component Value Range Comment   Sodium 139  135 - 145 (mEq/L)    Potassium 3.5   3.5 - 5.1 (mEq/L)    Chloride 105  96 - 112 (mEq/L)    CO2 24  19 - 32 (mEq/L)    Glucose, Bld 178 (*) 70 - 99 (mg/dL)    BUN 32 (*) 6 - 23 (mg/dL)    Creatinine, Ser 9.14 (*) 0.50 - 1.10 (mg/dL)    Calcium 8.5  8.4 - 10.5 (mg/dL)    Total Protein 5.3 (*) 6.0 - 8.3 (g/dL)    Albumin 2.1 (*) 3.5 - 5.2 (g/dL)    AST 40 (*) 0 - 37 (U/L)    ALT 45 (*) 0 - 35 (U/L)    Alkaline Phosphatase 40  39 - 117 (U/L)    Total Bilirubin 0.3  0.3 - 1.2 (mg/dL)    GFR calc non Af Amer 47 (*) >90 (mL/min)    GFR calc Af Amer 55 (*) >90 (mL/min)   CBC     Status: Abnormal   Collection Time   11/25/11  5:40 AM      Component Value Range Comment   WBC 9.6  4.0 - 10.5 (K/uL)    RBC 3.40 (*) 3.87 - 5.11 (MIL/uL)    Hemoglobin 10.8 (*) 12.0 - 15.0 (g/dL)    HCT 78.2 (*) 95.6 - 46.0 (%)    MCV 90.6  78.0 - 100.0 (fL)    MCH 31.8  26.0 - 34.0 (pg)    MCHC 35.1  30.0 - 36.0 (g/dL)    RDW 21.3  08.6 - 57.8 (%)    Platelets 243  150 - 400 (K/uL)   DIFFERENTIAL     Status: Abnormal   Collection Time   11/25/11  5:40 AM      Component Value Range Comment   Neutrophils Relative 66  43 - 77 (%)    Neutro Abs 6.3  1.7 - 7.7 (K/uL)    Lymphocytes Relative 17  12 - 46 (%)    Lymphs Abs 1.7  0.7 - 4.0 (K/uL)    Monocytes Relative 17 (*) 3 - 12 (%)    Monocytes Absolute 1.6 (*) 0.1 - 1.0 (K/uL)    Eosinophils Relative 0  0 - 5 (%)    Eosinophils Absolute 0.0  0.0 - 0.7 (K/uL)    Basophils Relative 0  0 - 1 (%)    Basophils Absolute 0.0  0.0 - 0.1 (K/uL)   GLUCOSE, CAPILLARY     Status: Abnormal   Collection Time   11/25/11  8:09 AM      Component Value Range Comment   Glucose-Capillary 162 (*) 70 - 99 (mg/dL)   GLUCOSE, CAPILLARY     Status: Abnormal   Collection Time   11/25/11 11:54 AM      Component Value Range Comment   Glucose-Capillary 165 (*) 70 - 99 (mg/dL)   GLUCOSE, CAPILLARY     Status: Abnormal   Collection Time   11/25/11  4:54 PM      Component Value Range Comment   Glucose-Capillary 200  (*) 70 - 99 (mg/dL)     No results found.  Review of Systems  Unable to perform ROS: other   Blood pressure 182/100, pulse 78, temperature 98.8 F (37.1 C), temperature source Oral, resp. rate 18, height 5\' 3"  (1.6 m), weight 120.8 kg (266 lb 5.1 oz), SpO2 96.00%. Physical Exam  Assessment/Plan: Pt interviewed, Chart reviewed; discussed with RN called Covering MD, Dr. Clyde Lundborg Pt has bandaged R arm. She is awake and alert.  Pt is discussed with Psych CSW Lorelle Formosa.  She plans to gather more collateral information.  Dr. Clyde Lundborg is notified that by prior interview this patient is unable to name medications, diagnoses that may be caused by several conditions.  Her history/admission of alcohol use confounds the history and possible diagnosis.  CSW, Lorelle Formosa plans to contact family for collateral information.  Due to notification after 1 pm today for follow up evaluation collateral information is unable to be obtained. RECOMMENDATION:  1. Continue to follow up with MD, Psych CSW and MD Monday. 2. Dr. Clyde Lundborg plans to notifiy Dr. Candy Sledge.   Brynlynn Walko 11/25/2011, 6:04 PM
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
## 2011-11-25 NOTE — Progress Notes (Signed)
Inpatient Diabetes Program Recommendations  AACE/ADA: New Consensus Statement on Inpatient Glycemic Control (2009)  Target Ranges:  Prepandial:   less than 140 mg/dL      Peak postprandial:   less than 180 mg/dL (1-2 hours)      Critically ill patients:  140 - 180 mg/dL   CBGs 40/98: 119/ 147/ 291/ 264 mg/dl  Inpatient Diabetes Program Recommendations Insulin - Basal: . Insulin - Meal Coverage: Please add Novolog 4 units tid with meals while pt on PO Prednisone.  Note: Will follow.
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
## 2011-11-26 LAB — COMPREHENSIVE METABOLIC PANEL
ALT: 40 U/L — ABNORMAL HIGH (ref 0–35)
ALT: 49 U/L — ABNORMAL HIGH (ref 0–35)
AST: 32 U/L (ref 0–37)
AST: 38 U/L — ABNORMAL HIGH (ref 0–37)
Albumin: 2.2 g/dL — ABNORMAL LOW (ref 3.5–5.2)
Albumin: 2.5 g/dL — ABNORMAL LOW (ref 3.5–5.2)
Alkaline Phosphatase: 49 U/L (ref 39–117)
Alkaline Phosphatase: 43 U/L (ref 39–117)
BUN: 21 mg/dL (ref 6–23)
BUN: 24 mg/dL — ABNORMAL HIGH (ref 6–23)
CO2: 26 mEq/L (ref 19–32)
CO2: 28 mEq/L (ref 19–32)
Calcium: 8.7 mg/dL (ref 8.4–10.5)
Calcium: 9.1 mg/dL (ref 8.4–10.5)
Chloride: 102 mEq/L (ref 96–112)
Chloride: 100 mEq/L (ref 96–112)
Creatinine, Ser: 0.78 mg/dL (ref 0.50–1.10)
Creatinine, Ser: 0.85 mg/dL (ref 0.50–1.10)
GFR calc Af Amer: >90 mL/min (ref >90)
GFR calc Af Amer: 80 mL/min — ABNORMAL LOW (ref >90)
GFR calc non Af Amer: 85 mL/min — ABNORMAL LOW (ref >90)
GFR calc non Af Amer: 69 mL/min — ABNORMAL LOW (ref >90)
Glucose, Bld: 198 mg/dL — ABNORMAL HIGH (ref 70–99)
Glucose, Bld: 268 mg/dL — ABNORMAL HIGH (ref 70–99)
Potassium: 3.8 mEq/L (ref 3.5–5.1)
Potassium: 4.5 mEq/L (ref 3.5–5.1)
Sodium: 137 mEq/L (ref 135–145)
Sodium: 136 mEq/L (ref 135–145)
Total Bilirubin: 0.4 mg/dL (ref 0.3–1.2)
Total Bilirubin: 0.3 mg/dL (ref 0.3–1.2)
Total Protein: 5.3 g/dL — ABNORMAL LOW (ref 6.0–8.3)
Total Protein: 5.8 g/dL — ABNORMAL LOW (ref 6.0–8.3)

## 2011-11-26 LAB — GLUCOSE, CAPILLARY
Glucose-Capillary: 139 mg/dL — ABNORMAL HIGH (ref 70–99)
Glucose-Capillary: 186 mg/dL — ABNORMAL HIGH (ref 70–99)
Glucose-Capillary: 262 mg/dL — ABNORMAL HIGH (ref 70–99)
Glucose-Capillary: 300 mg/dL — ABNORMAL HIGH (ref 70–99)

## 2011-11-26 LAB — CK: Total CK: 852 U/L — ABNORMAL HIGH (ref 7–177)

## 2011-11-26 LAB — CBC
HCT: 32.2 % — ABNORMAL LOW (ref 36.0–46.0)
Hemoglobin: 11.2 g/dL — ABNORMAL LOW (ref 12.0–15.0)
MCH: 31.5 pg (ref 26.0–34.0)
MCHC: 34.8 g/dL (ref 30.0–36.0)
MCV: 90.4 fL (ref 78.0–100.0)
Platelets: 297 10*3/uL (ref 150–400)
RBC: 3.56 MIL/uL — ABNORMAL LOW (ref 3.87–5.11)
RDW: 15.4 % (ref 11.5–15.5)
WBC: 11 10*3/uL — ABNORMAL HIGH (ref 4.0–10.5)

## 2011-11-26 LAB — MAGNESIUM: Magnesium: 1.7 mg/dL (ref 1.5–2.5)

## 2011-11-26 MED ORDER — ENSURE CLINICAL ST REVIGOR PO LIQD
237.0000 mL | Freq: Three times a day (TID) | ORAL | Status: DC
  Administered 2011-11-26 – 2011-11-27 (×5): 237 mL via ORAL
  Administered 2011-11-28: 14:00:00 via ORAL
  Administered 2011-11-28 – 2011-11-30 (×6): 237 mL via ORAL

## 2011-11-26 MED ORDER — PREDNISONE 20 MG PO TABS
40.0000 mg | ORAL_TABLET | Freq: Every day | ORAL | Status: AC
  Administered 2011-11-27 – 2011-11-30 (×4): 40 mg via ORAL
  Filled 2011-11-26 (×4): qty 2

## 2011-11-26 MED ORDER — OXYCODONE HCL 5 MG PO TABS: 5.0000 mg | ORAL_TABLET | Freq: Four times a day (QID) | ORAL | Status: DC | PRN

## 2011-11-26 MED ORDER — HYDROCODONE-ACETAMINOPHEN 5-325 MG PO TABS
0.5000 | ORAL_TABLET | Freq: Four times a day (QID) | ORAL | Status: DC | PRN
  Administered 2011-11-26 – 2011-11-28 (×5): 0.5 via ORAL
  Filled 2011-11-26 (×5): qty 1

## 2011-11-26 MED ORDER — DILTIAZEM HCL ER COATED BEADS 180 MG PO CP24
180.0000 mg | ORAL_CAPSULE | Freq: Every day | ORAL | Status: DC
  Administered 2011-11-26 – 2011-11-27 (×2): 180 mg via ORAL
  Filled 2011-11-26 (×2): qty 1

## 2011-11-26 MED ORDER — DILTIAZEM HCL ER COATED BEADS 360 MG PO CP24: 360.0000 mg | ORAL_CAPSULE | Freq: Every day | ORAL | Status: DC

## 2011-11-26 MED ORDER — INSULIN GLARGINE 100 UNIT/ML ~~LOC~~ SOLN
4.0000 [IU] | Freq: Every day | SUBCUTANEOUS | Status: DC
  Administered 2011-11-26 – 2011-11-27 (×2): 4 [IU] via SUBCUTANEOUS
  Filled 2011-11-26: qty 3

## 2011-11-26 MED ORDER — IBUPROFEN 400 MG PO TABS
400.0000 mg | ORAL_TABLET | Freq: Three times a day (TID) | ORAL | Status: DC
  Administered 2011-11-26 – 2011-11-28 (×6): 400 mg via ORAL
  Filled 2011-11-26 (×9): qty 1
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
## 2011-11-26 NOTE — Progress Notes (Signed)
Hydrocodone 0.5 mg administered for generalized  pain 9/10. We will  continue to monitor.

## 2011-11-27 LAB — CULTURE, BLOOD (ROUTINE X 2)
Culture  Setup Time: 201301210109
Culture  Setup Time: 201301210109
Culture: NO GROWTH
Culture: NO GROWTH

## 2011-11-27 LAB — GLUCOSE, CAPILLARY
Glucose-Capillary: 199 mg/dL — ABNORMAL HIGH (ref 70–99)
Glucose-Capillary: 213 mg/dL — ABNORMAL HIGH (ref 70–99)
Glucose-Capillary: 324 mg/dL — ABNORMAL HIGH (ref 70–99)
Glucose-Capillary: 301 mg/dL — ABNORMAL HIGH (ref 70–99)

## 2011-11-27 LAB — DIFFERENTIAL
Basophils Absolute: 0 10*3/uL (ref 0.0–0.1)
Basophils Relative: 0 % (ref 0–1)
Eosinophils Absolute: 0 10*3/uL (ref 0.0–0.7)
Eosinophils Relative: 0 % (ref 0–5)
Lymphocytes Relative: 14 % (ref 12–46)
Lymphs Abs: 1.9 10*3/uL (ref 0.7–4.0)
Monocytes Absolute: 1.4 10*3/uL — ABNORMAL HIGH (ref 0.1–1.0)
Monocytes Relative: 11 % (ref 3–12)
Neutro Abs: 10 10*3/uL — ABNORMAL HIGH (ref 1.7–7.7)
Neutrophils Relative %: 75 % (ref 43–77)

## 2011-11-27 LAB — CBC
HCT: 33.1 % — ABNORMAL LOW (ref 36.0–46.0)
Hemoglobin: 11.4 g/dL — ABNORMAL LOW (ref 12.0–15.0)
MCH: 31.2 pg (ref 26.0–34.0)
MCHC: 34.4 g/dL (ref 30.0–36.0)
MCV: 90.7 fL (ref 78.0–100.0)
Platelets: 310 10*3/uL (ref 150–400)
RBC: 3.65 MIL/uL — ABNORMAL LOW (ref 3.87–5.11)
RDW: 15.3 % (ref 11.5–15.5)
WBC: 13.4 10*3/uL — ABNORMAL HIGH (ref 4.0–10.5)

## 2011-11-27 MED ORDER — DILTIAZEM HCL ER COATED BEADS 180 MG PO CP24
180.0000 mg | ORAL_CAPSULE | Freq: Once | ORAL | Status: AC
  Administered 2011-11-27: 180 mg via ORAL
  Filled 2011-11-27: qty 1

## 2011-11-27 MED ORDER — DILTIAZEM HCL ER COATED BEADS 360 MG PO CP24
360.0000 mg | ORAL_CAPSULE | Freq: Every day | ORAL | Status: DC
  Administered 2011-11-28 – 2011-11-30 (×3): 360 mg via ORAL
  Filled 2011-11-27 (×3): qty 1

## 2011-11-27 NOTE — Progress Notes (Signed)
Today pt is alert and oriented x 4. Pt stated that she is feeling better, and with Gods help she will make it home.

## 2011-11-27 NOTE — Progress Notes (Signed)
Subjective: No events overnight. Feels like her arm is improved today. Denies chest pain or shortness of breath. Endorses some mild rhinorrhea. She denies fevers or chills.   Objective: Vital signs in last 24 hours: Filed Vitals:   11/26/11 2050 11/27/11 0500 11/27/11 0505 11/27/11 0924  BP: 154/89  150/91 146/80  Pulse: 63  61 63  Temp: 98.2 F (36.8 C)  98 F (36.7 C) 98.7 F (37.1 C)  TempSrc: Oral  Oral Oral  Resp: 18  18 17   Height:      Weight: 268 lb 1.3 oz (121.6 kg) 268 lb 1.3 oz (121.6 kg)    SpO2: 96%  92% 98%   Weight change: -10.6 oz (-0.3 kg)  Intake/Output Summary (Last 24 hours) at 11/27/11 1026 Last data filed at 11/27/11 0900  Gross per 24 hour  Intake   1440 ml  Output   2425 ml  Net   -985 ml   Physical Exam:  General: Obese woman resting in bed.  HEENT: Right pupil irregular. Left pupil round and reactive.  EOMI, no scleral icterus. No oral or occular lesions.  Cardiac: RRR, no rubs, murmurs or gallops Pulm: clear to auscultation bilaterally, moving normal volumes of air Abd: soft, nontender, nondistended, BS present Ext: Right forearm has a very large 12 x 5 inch debrided ulcer. There are large flaccid bullae in the right upper arm and right chest. Smaller flaccid bullae present on the lateral upper arm. These are improved today with new pink skin clearly present.  Tense bulla present on the palm of the right hand. Non pitting edema present in the bil feet, ankles and right hand. Neuro: cranial nerves II-XII grossly intact. No focal neurologic deficits Psych: Alert and oriented x3. appears confused. Continues with bewildered affect and mild confusion.  Lab Results: Basic Metabolic Panel:  Lab 11/26/11 1478 11/26/11 0941 11/22/11 0450 11/21/11 0500  NA 136 137 -- --  K 4.5 3.8 -- --  CL 100 102 -- --  CO2 28 26 -- --  GLUCOSE 268* 198* -- --  BUN 24* 21 -- --  CREATININE 0.85 0.78 -- --  CALCIUM 9.1 8.7 -- --  MG -- 1.7 -- 2.1  PHOS -- -- 4.6  5.2*    Liver Function Tests:  Lab 11/26/11 1707 11/26/11 0941  AST 38* 32  ALT 49* 40*  ALKPHOS 43 49  BILITOT 0.3 0.4  PROT 5.8* 5.3*  ALBUMIN 2.5* 2.2*    Lab 11/20/11 1853 11/20/11 1521  LIPASE 175* 153*  AMYLASE 94 --   CBC:  Lab 11/27/11 0535 11/26/11 0941 11/25/11 0540  WBC 13.4* 11.0* --  NEUTROABS 10.0* -- 6.3  HGB 11.4* 11.2* --  HCT 33.1* 32.2* --  MCV 90.7 90.4 --  PLT 310 297 --   Cardiac Enzymes:  Lab 11/26/11 0941 11/24/11 0555 11/22/11 0450 11/21/11 1546 11/21/11 0800  CKTOTAL 852* 2215* 5782* -- --  CKMB -- -- 27.5* 46.5* 88.5*  CKMBINDEX -- -- -- -- --  TROPONINI -- -- <0.30 <0.30 0.34*   BNP:  Lab 11/20/11 1853  PROBNP 4896.0*   CBG:  Lab 11/27/11 0759 11/26/11 2055 11/26/11 1650 11/26/11 1243 11/26/11 0753 11/25/11 2104  GLUCAP 199* 300* 262* 186* 139* 223*   Thyroid Function Tests:  Lab 11/22/11 1120  TSH 0.864  T4TOTAL --  FREET4 --  T3FREE --  THYROIDAB --   Urine Drug Screen: Drugs of Abuse     Component Value Date/Time   LABOPIA NONE  DETECTED 11/20/2011 2059   LABOPIA NEGATIVE 04/05/2010 0320   COCAINSCRNUR NONE DETECTED 11/20/2011 2059   COCAINSCRNUR NEGATIVE 04/05/2010 0320   LABBENZ NONE DETECTED 11/20/2011 2059   LABBENZ NEGATIVE 04/05/2010 0320   AMPHETMU NONE DETECTED 11/20/2011 2059   AMPHETMU NEGATIVE 04/05/2010 0320   THCU NONE DETECTED 11/20/2011 2059   LABBARB NONE DETECTED 11/20/2011 2059   . Alcohol Level:  Lab 11/20/11 1741  ETH <11    Micro Results: Recent Results (from the past 240 hour(s))  URINE CULTURE     Status: Normal   Collection Time   11/20/11  3:32 PM      Component Value Range Status Comment   Specimen Description URINE, CATHETERIZED   Final    Special Requests NONE   Final    Setup Time [id number]   Final    Colony Count NO GROWTH   Final    Culture NO GROWTH   Final    Report Status 11/22/2011 FINAL   Final   CULTURE, BLOOD (ROUTINE X 2)     Status: Normal   Collection Time   11/20/11   3:45 PM      Component Value Range Status Comment   Specimen Description BLOOD CENTRAL LINE   Final    Special Requests BOTTLES DRAWN AEROBIC ONLY   Final    Setup Time [id number]   Final    Culture NO GROWTH 5 DAYS   Final    Report Status 11/27/2011 FINAL   Final   CULTURE, BLOOD (ROUTINE X 2)     Status: Normal   Collection Time   11/20/11  6:19 PM      Component Value Range Status Comment   Specimen Description BLOOD LEFT HAND   Final    Special Requests BOTTLES DRAWN AEROBIC ONLY   Final    Setup Time [id number]   Final    Culture NO GROWTH 5 DAYS   Final    Report Status 11/27/2011 FINAL   Final   AFB CULTURE, BLOOD     Status: Normal (Preliminary result)   Collection Time   11/20/11  6:53 PM      Component Value Range Status Comment   Specimen Description BLOOD CENTRAL LINE   Final    Special Requests BOTTLES DRAWN AEROBIC ONLY 5CC   Final    Culture     Final    Value: CULTURE WILL BE EXAMINED FOR 6 WEEKS BEFORE ISSUING A FINAL REPORT   Report Status PENDING   Incomplete   URINE CULTURE     Status: Normal   Collection Time   11/20/11  8:59 PM      Component Value Range Status Comment   Specimen Description URINE, CATHETERIZED   Final    Special Requests Normal   Final    Setup Time [id number]   Final    Colony Count NO GROWTH   Final    Culture NO GROWTH   Final    Report Status 11/22/2011 FINAL   Final   MRSA PCR SCREENING     Status: Normal   Collection Time   11/20/11  9:50 PM      Component Value Range Status Comment   MRSA by PCR NEGATIVE  NEGATIVE  Final    Studies/Results: No results found. Medications: I have reviewed the patient's current medications. Scheduled Meds:    . aspirin  81 mg Oral Daily  . carvedilol  25 mg Oral BID WC  . diltiazem  180 mg Oral Daily  . feeding supplement  237 mL Oral TID WC  . folic acid  1 mg Oral Daily  . heparin  5,000 Units Subcutaneous Q8H  . ibuprofen  400 mg Oral TID WC  . insulin aspart  0-15 Units  Subcutaneous TID WC  . insulin aspart  0-5 Units Subcutaneous QHS  . insulin aspart  4 Units Subcutaneous TID WC  . insulin glargine  4 Units Subcutaneous QHS  . mulitivitamin with minerals  1 tablet Oral Daily  . predniSONE  40 mg Oral Q breakfast  . silver sulfADIAZINE   Topical BID  . sodium chloride  1,000 mL Intravenous Once  . thiamine  100 mg Oral Daily   Continuous Infusions:   PRN Meds:.sodium chloride, acetaminophen, calcium carbonate, HYDROcodone-acetaminophen Assessment/Plan:   66 year old woman who presented with altered mental status, hypotension and rhabdomyolysis likely from prolonged entrapment.  1 hypotensive shock: Resolved. patient's blood pressure has been stable now off pressors x5 days with stable blood pressures. Got vancomycin x 2 days and Zosyn x 4 days. Patient's right upper extremity debrided by trauma service 1/23.  Patient was on stress dose hydrocortisone 50 mg every 6 hours until January 21st. Patient missed steroid 1/22. 50 mg pred 1/23-1/26.  -- Prednisone 40 milligrams x 4 days then taper further  2) right upper extremity wound - new bullae formation on palms overnight 1/23. No new lesions since then. No spread of lesions observed. Old lesions on upper arm are improving. The wound on her right axilla has a very well demarcated linear border which would make a systemic process unlikely. Do not believe this to be burn.  Concern for systemic process is low. 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  -- appreciate derm and ID consults -- Continue Silvadene dressings -- monitor electrolytes -- pain control. Will avoid narcotics as much as possible.  3) altered mental status:  Stable since transfer from the ICU. Psychiatry consulted. They deemed the patient is incapable of making her own medical decisions. Per prior MDs who have cared for the pt., she has behaved oddly in clinic in the past. Concern is that her current  state may be her baseline. Dr. Ferol Luz to re-evaluate pt on Monday. If still deemed not to have capacity, will pursue placement to SNF. Will decrease narcotic dose to avoid confusion.  -- Appreciate Dr. Blenda Peals assistance -- Continue CIWA -- Likely discharge to skilled nursing facility  4) acute kidney injury. The patient's presenting creatinine was 4.1. This trended down with aggressive fluid hydration. Creatinine now 0.85. -- Encourage by mouth intake -- Will saline lock IV.  5) rhabdomyolysis: Initial CK on presentation was highly elevated. This has trended down appropriately. [id number] on 1/26. Given appropriate decrease in CK we will discontinue following unless creatinine dictates otherwise. -- Encourage by mouth intake  6) diabetes mellitus: On outpatient patient takes 500 mg metformin twice a day and 5 mg glipizide daily. CBGs continue to be elevated the patient is on prednisone at this time. -- carb modified diet -- 4 units Lantus each bedtime -- 4 units novolog meal coverage while on steroids -- Sliding scale insulin moderate + HS coverage  7) CARDIOMYOPATHY / SVT- EF 54% with hypokinetic inferior and inferolateral left ventricular walls with ejection fraction of 54% per nuclear perfusion study performed on June 2011. This is likely exacerbated by her alcohol intake. -- Aspirin 81 mg daily -- carvedilol 25 mg BID -- Given stable  blood pressures we'll restart her home dose of diltiazem 360 mg daily. -- restart olmesartan when creatinine has normlaized. Likely as an outpatient. -- Social work consult for alcohol cessation  8) alcohol abuse and social problems: Social work consulted and is following the patient.  -- CIWA -- Thiamine, folate, multivitamin  DVT prophylaxis: Heparin   LOS: 7 days   Tarron Krolak 11/27/2011, 10:26 AM

## 2011-11-27 NOTE — Progress Notes (Signed)
Pt seen awake in bed, had some issues identifying what she had or should have had today. We will continue to assist pt with her concerns.

## 2011-11-28 ENCOUNTER — Inpatient Hospital Stay (HOSPITAL_COMMUNITY): Payer: Medicare Other

## 2011-11-28 DIAGNOSIS — R579 Shock, unspecified: Secondary | ICD-10-CM | POA: Diagnosis not present

## 2011-11-28 DIAGNOSIS — M719 Bursopathy, unspecified: Secondary | ICD-10-CM | POA: Diagnosis not present

## 2011-11-28 DIAGNOSIS — G9341 Metabolic encephalopathy: Secondary | ICD-10-CM | POA: Diagnosis present

## 2011-11-28 DIAGNOSIS — R4182 Altered mental status, unspecified: Secondary | ICD-10-CM

## 2011-11-28 DIAGNOSIS — E119 Type 2 diabetes mellitus without complications: Secondary | ICD-10-CM | POA: Diagnosis not present

## 2011-11-28 DIAGNOSIS — F329 Major depressive disorder, single episode, unspecified: Secondary | ICD-10-CM | POA: Diagnosis not present

## 2011-11-28 DIAGNOSIS — M67919 Unspecified disorder of synovium and tendon, unspecified shoulder: Secondary | ICD-10-CM | POA: Diagnosis not present

## 2011-11-28 DIAGNOSIS — M25519 Pain in unspecified shoulder: Secondary | ICD-10-CM | POA: Diagnosis not present

## 2011-11-28 DIAGNOSIS — S46819A Strain of other muscles, fascia and tendons at shoulder and upper arm level, unspecified arm, initial encounter: Secondary | ICD-10-CM | POA: Diagnosis not present

## 2011-11-28 LAB — COMPREHENSIVE METABOLIC PANEL
ALT: 35 U/L (ref 0–35)
AST: 20 U/L (ref 0–37)
Albumin: 2.2 g/dL — ABNORMAL LOW (ref 3.5–5.2)
Alkaline Phosphatase: 41 U/L (ref 39–117)
BUN: 23 mg/dL (ref 6–23)
CO2: 27 mEq/L (ref 19–32)
Calcium: 8.5 mg/dL (ref 8.4–10.5)
Chloride: 100 mEq/L (ref 96–112)
Creatinine, Ser: 0.74 mg/dL (ref 0.50–1.10)
GFR calc Af Amer: >90 mL/min (ref >90)
GFR calc non Af Amer: 86 mL/min — ABNORMAL LOW (ref >90)
Glucose, Bld: 235 mg/dL — ABNORMAL HIGH (ref 70–99)
Potassium: 4.3 mEq/L (ref 3.5–5.1)
Sodium: 137 mEq/L (ref 135–145)
Total Bilirubin: 0.3 mg/dL (ref 0.3–1.2)
Total Protein: 5.1 g/dL — ABNORMAL LOW (ref 6.0–8.3)

## 2011-11-28 LAB — GLUCOSE, CAPILLARY
Glucose-Capillary: 227 mg/dL — ABNORMAL HIGH (ref 70–99)
Glucose-Capillary: 234 mg/dL — ABNORMAL HIGH (ref 70–99)
Glucose-Capillary: 427 mg/dL — ABNORMAL HIGH (ref 70–99)
Glucose-Capillary: 287 mg/dL — ABNORMAL HIGH (ref 70–99)

## 2011-11-28 MED ORDER — INSULIN GLARGINE 100 UNIT/ML ~~LOC~~ SOLN
10.0000 [IU] | Freq: Every day | SUBCUTANEOUS | Status: DC
  Administered 2011-11-28 – 2011-11-29 (×2): 10 [IU] via SUBCUTANEOUS

## 2011-11-28 MED ORDER — INSULIN ASPART 100 UNIT/ML ~~LOC~~ SOLN
6.0000 [IU] | Freq: Three times a day (TID) | SUBCUTANEOUS | Status: DC
  Administered 2011-11-28 – 2011-11-30 (×6): 6 [IU] via SUBCUTANEOUS

## 2011-11-28 MED ORDER — LORAZEPAM 2 MG/ML IJ SOLN: 2.0000 mg | Freq: Once | INTRAMUSCULAR | Status: DC

## 2011-11-28 NOTE — Consult Note (Signed)
Wound care followup:  Right axilla and right posterior arm wounds improving.  Pink without slough or significant drainage. Plan:  Foam dressing to protect areas from friction/shear and promote healing.    New areas of blistering/eschar have evolved since previous assessment.  Right hand with blistering to palm and fingers.  Right shoulder 4X4cm  Area, right behind ear 3X3cm with same appearance.  Right elbow 6X2cm yellow slough.  Other areas to right arm remain with patchy areas of blistering, or pink moist wounds with mod tan-yellow drainage, no odor.  Plan:  Continue Silvadene to affected areas until improved.  Pt will need home health after discharge for dressing changes.   Cammie Mcgee, RN, MSN, [employer]  [phone]

## 2011-11-28 NOTE — Progress Notes (Signed)
Internal Medicine Attending  Date: 11/28/2011  Patient name: Allison Caldwell Medical record number: [id number] Date of birth: 05-Sep-1946 Age: 66 y.o. Gender: female  I saw and evaluated the patient. I reviewed the resident's note by Dr. Candy Sledge and I agree with the resident's findings and plans as documented in his note.

## 2011-11-28 NOTE — Progress Notes (Signed)
Subjective: No events overnight. C/o some abdominal discomfort. Has been in bed all weekend. PT did not come by.  Otherwise feels tired.    Objective: Vital signs in last 24 hours: Filed Vitals:   11/27/11 1731 11/27/11 2057 11/27/11 2200 11/28/11 0415  BP: 131/86 177/82 150/89 162/90  Pulse: 66 63 68 58  Temp: 98.4 F (36.9 C) 97.6 F (36.4 C)  98.1 F (36.7 C)  TempSrc: Oral Oral  Oral  Resp: 17 18  19   Height:  5\' 3"  (1.6 m)    Weight:  270 lb (122.471 kg)    SpO2: 95% 97%  95%   Weight change: 1 lb 14.7 oz (0.871 kg)  Intake/Output Summary (Last 24 hours) at 11/28/11 0744 Last data filed at 11/28/11 0416  Gross per 24 hour  Intake   1200 ml  Output   2550 ml  Net  -1350 ml   Physical Exam:  General: Obese woman resting in bed.  HEENT: Right pupil irregular. Left pupil round and reactive.  EOMI, no scleral icterus. No oral or occular lesions.  Cardiac: RRR, no rubs, murmurs or gallops Pulm: clear to auscultation bilaterally, moving normal volumes of air Abd: soft, nontender, nondistended, BS present Ext: Right anterior shoulder is TTP. She has limited Abduction of the right arm. Right forearm has a very large 12 x 5 inch debrided ulcer this appears to be healing. . There are large flaccid bullae in the right upper arm and right chest. Smaller flaccid bullae present on the lateral upper arm. These are improved today with new pink skin clearly present.  Tense bulla present on the palm of the right hand. Non pitting edema present in the bil feet, ankles and right hand. Neuro: cranial nerves II-XII grossly intact. No focal neurologic deficits Psych: Alert and oriented x3. appears confused. Continues with bewildered affect and mild confusion.  Lab Results: Basic Metabolic Panel:  Lab 11/28/11 4540 11/26/11 1707 11/26/11 0941 11/22/11 0450  NA 137 136 -- --  K 4.3 4.5 -- --  CL 100 100 -- --  CO2 27 28 -- --  GLUCOSE 235* 268* -- --  BUN 23 24* -- --  CREATININE 0.74  0.85 -- --  CALCIUM 8.5 9.1 -- --  MG -- -- 1.7 --  PHOS -- -- -- 4.6    Liver Function Tests:  Lab 11/28/11 0600 11/26/11 1707  AST 20 38*  ALT 35 49*  ALKPHOS 41 43  BILITOT 0.3 0.3  PROT 5.1* 5.8*  ALBUMIN 2.2* 2.5*   No results found for this basename: LIPASE:2,AMYLASE:2 in the last 168 hours CBC:  Lab 11/27/11 0535 11/26/11 0941 11/25/11 0540  WBC 13.4* 11.0* --  NEUTROABS 10.0* -- 6.3  HGB 11.4* 11.2* --  HCT 33.1* 32.2* --  MCV 90.7 90.4 --  PLT 310 297 --   Cardiac Enzymes:  Lab 11/26/11 0941 11/24/11 0555 11/22/11 0450 11/21/11 1546 11/21/11 0800  CKTOTAL 852* 2215* 5782* -- --  CKMB -- -- 27.5* 46.5* 88.5*  CKMBINDEX -- -- -- -- --  TROPONINI -- -- <0.30 <0.30 0.34*   CBG:  Lab 11/27/11 2106 11/27/11 1624 11/27/11 1150 11/27/11 0759 11/26/11 2055 11/26/11 1650  GLUCAP 301* 324* 213* 199* 300* 262*   Thyroid Function Tests:  Lab 11/22/11 1120  TSH 0.864  T4TOTAL --  FREET4 --  T3FREE --  THYROIDAB --   Micro Results:  Urine culture x2: 1/20 no growth Blood culture x2: 1/20 no growth AFB culture: NGTD  Studies/Results: No results found. Medications: I have reviewed the patient's current medications. Scheduled Meds:    . aspirin  81 mg Oral Daily  . carvedilol  25 mg Oral BID WC  . diltiazem  180 mg Oral Once  . diltiazem  360 mg Oral Daily  . feeding supplement  237 mL Oral TID WC  . folic acid  1 mg Oral Daily  . heparin  5,000 Units Subcutaneous Q8H  . ibuprofen  400 mg Oral TID WC  . insulin aspart  0-15 Units Subcutaneous TID WC  . insulin aspart  0-5 Units Subcutaneous QHS  . insulin aspart  4 Units Subcutaneous TID WC  . insulin glargine  4 Units Subcutaneous QHS  . mulitivitamin with minerals  1 tablet Oral Daily  . predniSONE  40 mg Oral Q breakfast  . silver sulfADIAZINE   Topical BID  . sodium chloride  1,000 mL Intravenous Once  . thiamine  100 mg Oral Daily  . DISCONTD: diltiazem  180 mg Oral Daily   Continuous  Infusions:   PRN Meds:.sodium chloride, acetaminophen, calcium carbonate, HYDROcodone-acetaminophen Assessment/Plan:   66 year old woman who presented with altered mental status, hypotension and rhabdomyolysis likely from prolonged entrapment.  1 hypotensive shock: Resolved. patient's blood pressure has been stable now off pressors x 6 days with stable blood pressures. Got vancomycin x 2 days and Zosyn x 4 days. Patient's right upper extremity debrided by trauma service 1/23.  Patient was on stress dose hydrocortisone 50 mg every 6 hours until January 21st. Patient missed steroid 1/22. 50 mg pred 1/23-1/26.  -- Prednisone 40 milligrams x 3 days then taper further -- will D/C tele today  2) right upper extremity wound - Ms Pullen developed new bullae formation on palms overnight 1/23. No new lesions since then. No spread of lesions observed. Old lesions are improving. The wound on her right axilla has a very well demarcated linear border which would make a systemic process unlikely. Do not believe this to be burn.  Concern for systemic process is low. 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  -- appreciate derm and ID consults -- Continue Silvadene dressings -- monitor electrolytes -- pain control. Will avoid narcotics as much as possible.  3) altered mental status:  Stable since transfer from the ICU. Psychiatry consulted. They deemed the patient is incapable of making her own medical decisions. Per prior MDs who have cared for the pt., she has behaved oddly in clinic in the past. Concern is that her current state may be her baseline. Dr. Ferol Luz to re-evaluate pt today. If still deemed not to have capacity, will pursue placement to SNF. Will decrease narcotic dose to avoid confusion.  -- Appreciate Dr. Blenda Peals assistance -- Likely discharge to skilled nursing facility  4) Right shoulder pain - pt could have been distracted by confused state. Pain  continues.  -- MRI to eval.   5) acute kidney injury. Resolved. The patient's presenting creatinine was 4.1. This trended down with aggressive fluid hydration. Creatinine now 0.74. -- Encourage by mouth intake -- Will saline lock IV.  6) rhabdomyolysis:  Resolved. Initial CK on presentation was highly elevated. This has trended down appropriately. [id number] on 1/26. Given appropriate decrease in CK, we discontinued following unless creatinine dictates otherwise. -- Encourage by mouth intake  7) diabetes mellitus: On outpatient patient takes 500 mg metformin twice a day and 5 mg glipizide daily. CBGs continue to be elevated, but the patient is  on prednisone at this time. -- carb modified diet -- 4 units Lantus each bedtime -- 4 units novolog meal coverage while on steroids -- Sliding scale insulin moderate + HS coverage  8) CARDIOMYOPATHY / SVT- EF 54% with hypokinetic inferior and inferolateral left ventricular walls with ejection fraction of 54% per nuclear perfusion study performed on June 2011. This is likely exacerbated by her alcohol intake. -- Aspirin 81 mg daily -- carvedilol 25 mg BID -- Given stable blood pressures we'll restart her home dose of diltiazem 360 mg daily. -- restart olmesartan when creatinine has normlaized.  -- Social work consult for alcohol cessation  9) alcohol abuse and social problems: Social work consulted and is following the patient.  -- discontinue CIWA the patient is outside the window for DTs -- Thiamine, folate, multivitamin  DVT prophylaxis: Heparin   LOS: 8 days   Ilena Dieckman 11/28/2011, 7:44 AM

## 2011-11-28 NOTE — Progress Notes (Signed)
Clinical Social Work-CSW received had off report from [facility] SW-CSW awaiting psych follow up to determine capacity. While pt sister in law requests phone call re: d/c planning per previous note/report from PCCM-pt sister in law reported to pt apartment complex that pt passed away and requested entry into the home. Due to complex social and family dynamics CSW will await psych follow up determining capacity and proceed with identifying appropriate decision makers PRN-Shigeko Manard-MSW, [phone]

## 2011-11-28 NOTE — Progress Notes (Signed)
PT Cancellation Note  Treatment cancelled today due to patient receiving procedure or test. Transporter arrived to take pt for MRI just as PT session was beginning.  Will attempt to see pt later This afternoon time permitting.   Ritvik Mczeal 11/28/2011, 2:26 PM [facility] Donald L. Kiahna Banghart DPT [phone]

## 2011-11-28 NOTE — Progress Notes (Signed)
Inpatient Diabetes Program Recommendations  AACE/ADA: New Consensus Statement on Inpatient Glycemic Control (2009)  Target Ranges:  Prepandial:   less than 140 mg/dL      Peak postprandial:   less than 180 mg/dL (1-2 hours)      Critically ill patients:  140 - 180 mg/dL   Reason for Visit: Hyperglycemia  Inpatient Diabetes Program Recommendations Insulin - Basal: Note that Lantus dose has been increased to 10 units at bedtime Insulin - Meal Coverage: May benefit from an increase in meal coverage from 4 units to 6 units tid  Note:  Results for Allison Caldwell, Allison Caldwell (MRN [id number]) as of 11/28/2011 13:53  Ref. Range 11/26/2011 07:53 11/26/2011 12:43 11/26/2011 16:50 11/26/2011 20:55 11/27/2011 07:59 11/27/2011 11:50 11/27/2011 16:24 11/27/2011 21:06 11/28/2011 08:03  Glucose-Capillary Latest Range: 70-99 mg/dL 086 (H) 578 (H) 469 (H) 300 (H) 199 (H) 213 (H) 324 (H) 301 (H) 227 (H)

## 2011-11-28 NOTE — Consult Note (Addendum)
Reason for Consult: Capacity Referring Physician: Dr. Lenell Antu Allison Caldwell is an 66 y.o. female.  HPI: The patient was found down after 3 days of noncommunication by her brother. She is brought to the hospital and found to have large polite on the right arm that have been debrided and in the process of healing. She does not remember is reason for her falling and being wedge between furniture in the wall. She does not remember what happened prior to this event.  AXIS I  Depression AXISII  Deferred  AXISIII  Past Medical History  Diagnosis Date  . Hypertension   . Diabetes mellitus     NIDDM  . Obesities, morbid   . TIA (transient ischemic attack)   . Depression   . Degenerative joint disease   . ETOH abuse   . Cardiomyopathy   AXIS IV  Medical problems, local social support, sons incarcerated AXIS V GAF  53  Past Surgical History  Procedure Date  . Orif ankle fracture     right  . Cesarean section     times 2  . Eye surgery     laser - right eye 2010  . Left wrist     surgical repair    Family History  Problem Relation Age of Onset  . Heart disease Mother   . Cancer Father   . Diabetes Sister   . Kidney disease Brother   . Stroke Brother     Social History:  reports that she has quit smoking. Her smoking use included Cigarettes. She smoked .5 packs per day. She does not have any smokeless tobacco history on file. She reports that she drinks alcohol. She reports that she does not use illicit drugs.  Allergies:  Allergies  Allergen Reactions  . Ace Inhibitors     REACTION: cough    Medications: I have reviewed the patient's current medications.  Results for orders placed during the hospital encounter of 11/20/11 (from the past 48 hour(s))  GLUCOSE, CAPILLARY     Status: Abnormal   Collection Time   11/26/11  8:55 PM      Component Value Range Comment   Glucose-Capillary 300 (*) 70 - 99 (mg/dL)   CBC     Status: Abnormal   Collection Time   11/27/11  5:35 AM       Component Value Range Comment   WBC 13.4 (*) 4.0 - 10.5 (K/uL)    RBC 3.65 (*) 3.87 - 5.11 (MIL/uL)    Hemoglobin 11.4 (*) 12.0 - 15.0 (g/dL)    HCT 40.9 (*) 81.1 - 46.0 (%)    MCV 90.7  78.0 - 100.0 (fL)    MCH 31.2  26.0 - 34.0 (pg)    MCHC 34.4  30.0 - 36.0 (g/dL)    RDW 91.4  78.2 - 95.6 (%)    Platelets 310  150 - 400 (K/uL)   DIFFERENTIAL     Status: Abnormal   Collection Time   11/27/11  5:35 AM      Component Value Range Comment   Neutrophils Relative 75  43 - 77 (%)    Neutro Abs 10.0 (*) 1.7 - 7.7 (K/uL)    Lymphocytes Relative 14  12 - 46 (%)    Lymphs Abs 1.9  0.7 - 4.0 (K/uL)    Monocytes Relative 11  3 - 12 (%)    Monocytes Absolute 1.4 (*) 0.1 - 1.0 (K/uL)    Eosinophils Relative 0  0 - 5 (%)  Eosinophils Absolute 0.0  0.0 - 0.7 (K/uL)    Basophils Relative 0  0 - 1 (%)    Basophils Absolute 0.0  0.0 - 0.1 (K/uL)   GLUCOSE, CAPILLARY     Status: Abnormal   Collection Time   11/27/11  7:59 AM      Component Value Range Comment   Glucose-Capillary 199 (*) 70 - 99 (mg/dL)   GLUCOSE, CAPILLARY     Status: Abnormal   Collection Time   11/27/11 11:50 AM      Component Value Range Comment   Glucose-Capillary 213 (*) 70 - 99 (mg/dL)    Comment 1 Notify RN     GLUCOSE, CAPILLARY     Status: Abnormal   Collection Time   11/27/11  4:24 PM      Component Value Range Comment   Glucose-Capillary 324 (*) 70 - 99 (mg/dL)   GLUCOSE, CAPILLARY     Status: Abnormal   Collection Time   11/27/11  9:06 PM      Component Value Range Comment   Glucose-Capillary 301 (*) 70 - 99 (mg/dL)    Comment 1 Documented in Chart      Comment 2 Notify RN     COMPREHENSIVE METABOLIC PANEL     Status: Abnormal   Collection Time   11/28/11  6:00 AM      Component Value Range Comment   Sodium 137  135 - 145 (mEq/L)    Potassium 4.3  3.5 - 5.1 (mEq/L)    Chloride 100  96 - 112 (mEq/L)    CO2 27  19 - 32 (mEq/L)    Glucose, Bld 235 (*) 70 - 99 (mg/dL)    BUN 23  6 - 23 (mg/dL)     Creatinine, Ser 4.09  0.50 - 1.10 (mg/dL)    Calcium 8.5  8.4 - 10.5 (mg/dL)    Total Protein 5.1 (*) 6.0 - 8.3 (g/dL)    Albumin 2.2 (*) 3.5 - 5.2 (g/dL)    AST 20  0 - 37 (U/L)    ALT 35  0 - 35 (U/L)    Alkaline Phosphatase 41  39 - 117 (U/L)    Total Bilirubin 0.3  0.3 - 1.2 (mg/dL)    GFR calc non Af Amer 86 (*) >90 (mL/min)    GFR calc Af Amer >90  >90 (mL/min)   GLUCOSE, CAPILLARY     Status: Abnormal   Collection Time   11/28/11  8:03 AM      Component Value Range Comment   Glucose-Capillary 227 (*) 70 - 99 (mg/dL)    Comment 1 Documented in Chart      Comment 2 Notify RN     GLUCOSE, CAPILLARY     Status: Abnormal   Collection Time   11/28/11 12:20 PM      Component Value Range Comment   Glucose-Capillary 234 (*) 70 - 99 (mg/dL)    Comment 1 Documented in Chart      Comment 2 Notify RN     GLUCOSE, CAPILLARY     Status: Abnormal   Collection Time   11/28/11  4:56 PM      Component Value Range Comment   Glucose-Capillary 427 (*) 70 - 99 (mg/dL)    Comment 1 Documented in Chart      Comment 2 Notify RN       Mr Shoulder Right Wo Contrast  11/28/2011  *RADIOLOGY REPORT*  Clinical Data: Right shoulder pain and decreased range  of motion. Patient found down with mental status change after possible fall.  MRI OF THE RIGHT SHOULDER WITHOUT CONTRAST  Technique:  Multiplanar, multisequence MR imaging was performed. No intravenous contrast was administered.  Comparison: Radiographs 11/20/2011.  Findings: There is diffuse subcutaneous edema surrounding the shoulder, especially superiorly and posteriorly.  There is also diffuse muscular edema surrounding the shoulder.  The muscular edema is most impressive within the supraspinatus, infraspinatus and lateral deltoid muscles.  There is moderate associated supraspinatus and infraspinatus tendinosis.  The supraspinatus tendon demonstrates partial articular surface insertional tearing, best seen on the coronal images.  No full-thickness rotator  cuff tear is demonstrated.  There is a small shoulder joint effusion with possible debris in the biceps tendon sheath.  No focal fluid is seen within the subacromial - subdeltoid bursa.  There is mild posterior subluxation of the glenohumeral joint. There are associated mild glenohumeral degenerative changes with probable subchondral edema in the anterior inferior glenoid.  The superior labrum is degenerated without evidence of tear.  The biceps tendon is intact.  There are cystic changes in the humeral head near the rotator cuff insertion.  No Hill-Sachs deformity is seen.  The acromion is type 3 with mild lateral downsloping.  There are mild acromioclavicular degenerative changes.  IMPRESSION:  1.  Diffuse muscular edema surrounding the shoulder is suspicious for myositis or muscle infarction in this patient with elevated serum CK levels. Infection not excluded.  Correlate clinically. 2.  Associated supraspinatus and infraspinatus tendinosis with partial articular surface tearing of the supraspinatus tendon.  No full-thickness tendon tear demonstrated. 3.  Glenohumeral degenerative changes with posterior subluxation and superior labral degeneration. 4.  No evidence of acute fracture or bone destruction.  Original Report Authenticated By: Gerrianne Scale, M.D.    Review of Systems  Unable to perform ROS: other   Blood pressure 113/68, pulse 64, temperature 98.2 F (36.8 C), temperature source Oral, resp. rate 18, height 5\' 3"  (1.6 m), weight 122.471 kg (270 lb), SpO2 92.00%. Physical Exam  Assessment/Plan: Chart is reviewed and the medications reviewed. This is the third visit for this patient. It is the first time the patient is sitting up in a chair and awake and alert. She has good eye contact and clear speech (without dentures). She states that her brother has visited her and his name is Leward Quan cell phone [phone] and home number and [phone]. She states that he has asked her to to stay at  his apartment. She explains that that is not possible. He has stairs that have to be used to reach both the kitchen in the bathroom. She fears she would fall and not be found until they come home. They leave for work at 5:30 in the morning. She does not to leave this is a reasonable option. She wants to return to her own apartment but admits she is not able to do the things she has been able to do in the past. She would need somebody to help cook her food, go shopping and help bathe her. She is cognitively intact. She has talked on the phone during this interview and has made very organized logical comments. She is able to remember her brother's phone numbers and she relates that she has been talking with her ex-husband at least once or twice a day. He lives in another state but they talked because she knows he has medical problems. It was the fact that he had not heard from her in 3 days  that caused him to contact her brother. She has stated that she was given sertraline for depression. It is noted in the medication log that this medication has not been ordered. Today she is feeling positive because she has been able to get out of bed and has been able to stand for the first time. She denies any suicidal or homicidal thoughts. She admits to drinking with her friend who visits he likes to drink vodka and that's what she has been a lot before she lost consciousness and he states that the last time he saw her Thursday before admission she was cognitively intact.. She does not remember She has a type expressed a clear understanding of her need some limitations. When given the option of assisted living versus returning to her apartment she expresses the desire to return to the apartment with some home health care. RECOMMENDATION: 1. Consider starting the antidepressant  SSRI sertraline at 50 mg oral daily. If she is in the hospital longer than one week increased to 100 mg oral daily 2. patient is cognitively intact  and consider his options with preference to return home. 3. Consider patient will need home health care when she does return home. 4  no further psychiatric need is identified. Psychiatry M.D. is signing off  Eloise Mula 11/28/2011, 6:09 PM

## 2011-11-29 LAB — GLUCOSE, CAPILLARY
Glucose-Capillary: 186 mg/dL — ABNORMAL HIGH (ref 70–99)
Glucose-Capillary: 244 mg/dL — ABNORMAL HIGH (ref 70–99)
Glucose-Capillary: 218 mg/dL — ABNORMAL HIGH (ref 70–99)
Glucose-Capillary: 265 mg/dL — ABNORMAL HIGH (ref 70–99)

## 2011-11-29 LAB — BASIC METABOLIC PANEL
BUN: 23 mg/dL (ref 6–23)
CO2: 28 mEq/L (ref 19–32)
Calcium: 9 mg/dL (ref 8.4–10.5)
Chloride: 101 mEq/L (ref 96–112)
Creatinine, Ser: 0.79 mg/dL (ref 0.50–1.10)
GFR calc Af Amer: >90 mL/min (ref >90)
GFR calc non Af Amer: 84 mL/min — ABNORMAL LOW (ref >90)
Glucose, Bld: 228 mg/dL — ABNORMAL HIGH (ref 70–99)
Potassium: 4.2 mEq/L (ref 3.5–5.1)
Sodium: 136 mEq/L (ref 135–145)

## 2011-11-29 MED ORDER — GLIPIZIDE 5 MG PO TABS
5.0000 mg | ORAL_TABLET | Freq: Every day | ORAL | Status: DC
  Administered 2011-11-30: 5 mg via ORAL
  Filled 2011-11-29: qty 1

## 2011-11-29 MED ORDER — METFORMIN HCL 500 MG PO TABS
500.0000 mg | ORAL_TABLET | Freq: Two times a day (BID) | ORAL | Status: DC
  Administered 2011-11-29 – 2011-11-30 (×2): 500 mg via ORAL
  Filled 2011-11-29 (×4): qty 1

## 2011-11-29 MED ORDER — SERTRALINE HCL 50 MG PO TABS
50.0000 mg | ORAL_TABLET | Freq: Every day | ORAL | Status: DC
  Administered 2011-11-29 – 2011-11-30 (×2): 50 mg via ORAL
  Filled 2011-11-29 (×2): qty 1

## 2011-11-29 MED ORDER — OLMESARTAN MEDOXOMIL 40 MG PO TABS
40.0000 mg | ORAL_TABLET | Freq: Every day | ORAL | Status: DC
  Administered 2011-11-29 – 2011-11-30 (×2): 40 mg via ORAL
  Filled 2011-11-29 (×2): qty 1

## 2011-11-29 NOTE — Progress Notes (Signed)
Occupational Therapy Treatment Patient Details Name: Allison Caldwell MRN: [id number] DOB: 09-Jan-1946 Today's Date: 11/29/2011 Time: 10:10-10:43am 1 TE; 1 TA; Indirect 2 OT Assessment/Plan OT Assessment/Plan Comments on Treatment Session: Orders specifically for RUE ROM and strengthening. Pt encouraged to use R UE for all functional activity including transfers, ADL's, selfcare tasks & to use R Hand for composite grip w/ use of RW for functional mobility. Pt should benefit from elevation R UE & consistent AROM throughout the day.     OT Plan: Discharge plan remains appropriate Equipment Recommended: Defer to next venue OT Goals Arm Goals Pt Will Perform AROM: with supervision, verbal cues required/provided Arm Goal: AROM - Progress: Progressing toward goal Miscellaneous OT Goals OT Goal: Miscellaneous Goal #1 - Progress: Progressing toward goals OT Goal: Miscellaneous Goal #2 - Progress: Progressing toward goals OT Goal: Miscellaneous Goal #3 - Progress: Progressing toward goals OT Goal: Miscellaneous Goal #4 - Progress: Progressing toward goals  OT Treatment Precautions/Restrictions  Precautions Precautions: Fall Restrictions Weight Bearing Restrictions: No   ADL ADL Eating/Feeding: Not assessed Grooming: Not assessed Upper Body Bathing: Not assessed Lower Body Bathing: Not assessed Upper Body Dressing: Not assessed Lower Body Dressing: Not assessed Toilet Transfer: Not assessed Toilet Transfer Method: Not assessed Toileting - Clothing Manipulation: Not assessed Where Assessed - Toileting Clothing Manipulation: Not assessed Toileting - Hygiene: Not assessed Where Assessed - Toileting Hygiene: Not assessed Tub/Shower Transfer: Not assessed ADL Comments: Orders specifically for RUE ROM and strengthening. Pt encouraged to use R UE for all functional activity for ADL's, selfcare tasks as well as for transfers & to use R Hand for composite grip w/ use of RW for functional  mobility. Mobility  Bed Mobility Bed Mobility: Yes Supine to Sit: 4: Min assist;With rails;HOB flat Supine to Sit Details (indicate cue type and reason): VC's for initiation & assist w/ trunk Sitting - Scoot to Edge of Bed: 5: Supervision Sitting - Scoot to Edge of Bed Details (indicate cue type and reason): Cues to initiate movement after sitting crooked Transfers Sit to Stand: 4: Min assist;Other (comment) (: Min assist;With upper extremity assist;From bed) Sit to Stand Details (indicate cue type and reason): Cues for placement on bed & use R UE during functional activity as well as ther ex. Stand to Sit: 4: Min assist;To chair/3-in-1;With armrests Stand to Sit Details: Cues for hand placement to control descent & to bring RW w/ her during transfer. Exercises General Exercises - Upper Extremity Shoulder Flexion: AROM;Right;Seated;10 reps;Other (comment) (EOB) Elbow Flexion: AROM;Right;Seated;10 reps;Other (comment) (in Chair) Elbow Extension: AROM;Right;10 reps;Seated;Other (comment) (@ EOB & in chair) Wrist Flexion: AROM;Right;10 reps;Seated;Other (comment) (@ EOB & in chair) Wrist Extension: AROM;Right;10 reps;Seated Digit Composite Flexion: AROM;Right;Seated;Other (comment) (R IF w/ PROM WFL's, but AROM limited for composite flex noted) Composite Extension: AROM;Right;Seated General Exercises - Lower Extremity Ankle Circles/Pumps: Supine;20 reps;Both;Right;AROM Quad Sets: Supine;10 reps;Both;Strengthening;AROM Long Arc Quad: Seated;10 reps;Both;Strengthening;AROM Heel Slides: Supine;10 reps;Both;Strengthening;AROM Other Exercises Other Exercises: Pt educated in edema control, elevating R UE @ all times; demonstrated propping UE on Pillows; use of R UE for all activity at all times to assist w/ decreasing edema (including ADL's transfers etc.); composite fisting exercises. Cont to assess pt understanding & follow through of this Other Exercises: Outer dressing/Kerlix removed and then  new kerlix reapplied to R UE secondary to outer old dressing noted to be falling off upon entry to room for treatment session. Pt w/ noted ability to flex/extend elbow & perform shoulder flexion after outer dressing was repplied.  End of Session OT - End of Session Equipment Utilized During Treatment: Gait belt;Other (comment) (RW) Activity Tolerance: Patient tolerated treatment well;Patient limited by fatigue Patient left: in chair;with call bell in reach;Other (comment) (R UE elevated/propped on pillows) General Behavior During Session: [facility] for tasks performed Cognition: Impaired, at baseline  Alm Bustard  11/29/2011, 1:13 PM

## 2011-11-29 NOTE — Progress Notes (Signed)
CSW provided bed offers to patient. Pt states she would like to discuss options with her brother, as she is unfamiliar with the area. Pt will decide tonight and d/c to SNF 1/30. CSW will continue to follow.   Baxter Flattery, MSW [phone] (for Bonnye Fava)

## 2011-11-29 NOTE — Progress Notes (Signed)
Subjective: No events overnight. States is feeling improved today. Happy to be getting better and looking forward to discharge. She is agreeable to short-term disability to skilled nursing facility for wound care and physical therapy. She tells me [birth year].   Objective: Vital signs in last 24 hours: Filed Vitals:   11/28/11 1800 11/28/11 2150 11/29/11 0511 11/29/11 0844  BP: 127/84 153/97 143/91 115/72  Pulse: 63 58 61 71  Temp: 98.3 F (36.8 C) 98.2 F (36.8 C) 98.6 F (37 C) 98.7 F (37.1 C)  TempSrc: Oral Oral Oral Oral  Resp: 18 18 20 21   Height:      Weight:  272 lb 0.8 oz (123.4 kg)    SpO2: 92% 95% 93% 97%   Weight change: 2 lb 0.8 oz (0.929 kg)  Intake/Output Summary (Last 24 hours) at 11/29/11 0953 Last data filed at 11/29/11 0844  Gross per 24 hour  Intake    840 ml  Output   1900 ml  Net  -1060 ml   Physical Exam:  General: Obese woman sitting in a chair. HEENT: Right pupil irregular. Left pupil round and reactive.  EOMI, no scleral icterus. No oral or occular lesions.  Cardiac: RRR, no rubs, murmurs or gallops Pulm: clear to auscultation bilaterally, moving normal volumes of air Abd: soft, nontender, nondistended, BS present Ext: Right anterior shoulder is not TTP today. She has limited Abduction of the right arm. Right forearm has a very large 12 x 5 inch debrided ulcer which appears to be healing well. All bullae are healing quite nicely with new skin covering and re pigmentation.  Tense bulla present on the palm of the right hand. Non pitting edema present in the bil feet, ankles and right hand. Neuro: cranial nerves II-XII grossly intact. No focal neurologic deficits Psych: Alert and oriented x3. Much less confusion today.  Lab Results: Basic Metabolic Panel:  Lab 11/29/11 1610 11/28/11 0600 11/26/11 0941  NA 136 137 --  K 4.2 4.3 --  CL 101 100 --  CO2 28 27 --  GLUCOSE 228* 235* --  BUN 23 23 --  CREATININE 0.79 0.74 --  CALCIUM 9.0  8.5 --  MG -- -- 1.7  PHOS -- -- --    Liver Function Tests:  Lab 11/28/11 0600 11/26/11 1707  AST 20 38*  ALT 35 49*  ALKPHOS 41 43  BILITOT 0.3 0.3  PROT 5.1* 5.8*  ALBUMIN 2.2* 2.5*   CBC:  Lab 11/27/11 0535 11/26/11 0941 11/25/11 0540  WBC 13.4* 11.0* --  NEUTROABS 10.0* -- 6.3  HGB 11.4* 11.2* --  HCT 33.1* 32.2* --  MCV 90.7 90.4 --  PLT 310 297 --   Cardiac Enzymes:  Lab 11/26/11 0941 11/24/11 0555  CKTOTAL 852* 2215*  CKMB -- --  CKMBINDEX -- --  TROPONINI -- --   CBG:  Lab 11/29/11 0741 11/28/11 2158 11/28/11 1656 11/28/11 1220 11/28/11 0803 11/27/11 2106  GLUCAP 186* 287* 427* 234* 227* 301*   Thyroid Function Tests:  Lab 11/22/11 1120  TSH 0.864  T4TOTAL --  FREET4 --  T3FREE --  THYROIDAB --   Micro Results:  Urine culture x2: 1/20 no growth Blood culture x2: 1/20 no growth AFB culture: NGTD  Studies/Results: Mr Shoulder Right Wo Contrast  11/28/2011  *RADIOLOGY REPORT*  Clinical Data: Right shoulder pain and decreased range of motion. Patient found down with mental status change after possible fall.  MRI OF THE RIGHT SHOULDER WITHOUT CONTRAST  Technique:  Multiplanar, multisequence MR imaging was performed. No intravenous contrast was administered.  Comparison: Radiographs 11/20/2011.  Findings: There is diffuse subcutaneous edema surrounding the shoulder, especially superiorly and posteriorly.  There is also diffuse muscular edema surrounding the shoulder.  The muscular edema is most impressive within the supraspinatus, infraspinatus and lateral deltoid muscles.  There is moderate associated supraspinatus and infraspinatus tendinosis.  The supraspinatus tendon demonstrates partial articular surface insertional tearing, best seen on the coronal images.  No full-thickness rotator cuff tear is demonstrated.  There is a small shoulder joint effusion with possible debris in the biceps tendon sheath.  No focal fluid is seen within the subacromial -  subdeltoid bursa.  There is mild posterior subluxation of the glenohumeral joint. There are associated mild glenohumeral degenerative changes with probable subchondral edema in the anterior inferior glenoid.  The superior labrum is degenerated without evidence of tear.  The biceps tendon is intact.  There are cystic changes in the humeral head near the rotator cuff insertion.  No Hill-Sachs deformity is seen.  The acromion is type 3 with mild lateral downsloping.  There are mild acromioclavicular degenerative changes.  IMPRESSION:  1.  Diffuse muscular edema surrounding the shoulder is suspicious for myositis or muscle infarction in this patient with elevated serum CK levels. Infection not excluded.  Correlate clinically. 2.  Associated supraspinatus and infraspinatus tendinosis with partial articular surface tearing of the supraspinatus tendon.  No full-thickness tendon tear demonstrated. 3.  Glenohumeral degenerative changes with posterior subluxation and superior labral degeneration. 4.  No evidence of acute fracture or bone destruction.  Original Report Authenticated By: Gerrianne Scale, M.D.   Medications: I have reviewed the patient's current medications. Scheduled Meds:    . aspirin  81 mg Oral Daily  . carvedilol  25 mg Oral BID WC  . diltiazem  360 mg Oral Daily  . feeding supplement  237 mL Oral TID WC  . folic acid  1 mg Oral Daily  . heparin  5,000 Units Subcutaneous Q8H  . insulin aspart  0-15 Units Subcutaneous TID WC  . insulin aspart  0-5 Units Subcutaneous QHS  . insulin aspart  6 Units Subcutaneous TID WC  . insulin glargine  10 Units Subcutaneous QHS  . LORazepam  2 mg Intravenous Once  . mulitivitamin with minerals  1 tablet Oral Daily  . olmesartan  40 mg Oral Daily  . predniSONE  40 mg Oral Q breakfast  . silver sulfADIAZINE   Topical BID  . sodium chloride  1,000 mL Intravenous Once  . thiamine  100 mg Oral Daily  . DISCONTD: insulin aspart  4 Units Subcutaneous TID WC   . DISCONTD: insulin glargine  4 Units Subcutaneous QHS   Continuous Infusions:   PRN Meds:.sodium chloride, acetaminophen, calcium carbonate, HYDROcodone-acetaminophen Assessment/Plan:  66 year old woman who presented with altered mental status, hypotension and rhabdomyolysis likely from prolonged entrapment.  1 hypotensive shock: Resolved. patient's blood pressure has been stable now off pressors x 7 days with stable blood pressures. Got vancomycin x 2 days and Zosyn x 4 days. Patient's right upper extremity debrided by trauma service 1/23.  Patient was on stress dose hydrocortisone 50 mg every 6 hours until January 21st. Patient missed steroid 1/22. 50 mg pred 1/23-1/26.  -- Prednisone 40 milligrams x 21/27-1/30 then taper further  2) right upper extremity wound - Ms Lingenfelter developed new bullae formation on palms overnight 1/23. No new lesions since then. No spread of lesions observed. Lesions are rapidly healing.  The wound on her right axilla has a very well demarcated linear border which would make a systemic process unlikely. Do not believe this to be burn.  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  -- appreciate derm and ID consults -- Continue Silvadene dressings -- pain control. Will avoid narcotics as much as possible.  3) altered mental status:  Improved since transfer from the ICU. Psychiatry consulted. They initially deemed the patient is incapable of making her own medical decisions. Upon reevaluation they deemed her to have capacity. We will still pursue short-term disposition to skilled nursing facility for wound care and rehabilitation. -- Appreciate Dr. Blenda Peals assistance -- Discharge to skilled nursing facility in AM  4) Right shoulder pain - Right shoulder MRI shows myositis with a partial tendon tear in the rotator cuff. The partial tear is nonoperative at this time.  -- She will likely need outpatient followup with orthopedics and  physical therapy.  5) acute kidney injury. Resolved. The patient's presenting creatinine was 4.1. This trended down with aggressive fluid hydration. Creatinine now 0.74. -- Encourage by mouth intake -- Will saline lock IV.  6) rhabdomyolysis:  Resolved. Initial CK on presentation was highly elevated. This has trended down appropriately. [id number]>272 on 1/26. Given appropriate decrease in CK, we discontinued following unless creatinine dictates otherwise. -- Encourage by mouth intake  7) diabetes mellitus: On outpatient patient takes 500 mg metformin twice a day and 5 mg glipizide daily. CBGs continue to be elevated, but the patient is on prednisone at this time. Will give one day holiday today from car modified diet so she can have a birthday cake. -- carb modified diet -- 10 units Lantus each bedtime -- 5 units novolog meal coverage while on steroids -- Will restart metformin and glipizide today. -- Sliding scale insulin moderate + HS coverage  8) CARDIOMYOPATHY / SVT- EF 54% with hypokinetic inferior and inferolateral left ventricular walls with ejection fraction of 54% per nuclear perfusion study performed on June 2011. This is likely exacerbated by her alcohol intake. -- Aspirin 81 mg daily -- carvedilol 25 mg BID -- diltiazem 360 mg daily. -- restarted olmesartan 40 mg daily today  -- Social work consult for alcohol cessation  9) alcohol abuse and social problems: Social work consulted and is following the patient.  -- discontinued CIWA the patient is outside the window for DTs -- Thiamine, folate, multivitamin  DVT prophylaxis: Heparin   LOS: 9 days   Allison Caldwell 11/29/2011, 9:53 AM

## 2011-11-29 NOTE — Progress Notes (Signed)
Physical Therapy Treatment Patient Details Name: Allison Caldwell MRN: [id number] DOB: 1946-09-21 Today's Date: 11/29/2011  PT Assessment/Plan  PT - Assessment/Plan Comments on Treatment Session: [place] with OT, tx focused on increasing I with mobility, gait with RW, and ther ex/ROM. Pt made good progress today, able to ambulate 61' with RW Min A, but limited due to fatigue. Pt continues to demonstrate pain behaviors with RUE movement, but is encouraged to continue moving that arm with mobility as much as possible.  PT Plan: Discharge plan remains appropriate PT Frequency: Min 3X/week Follow Up Recommendations: Skilled nursing facility Equipment Recommended: Defer to next venue PT Goals  Acute Rehab PT Goals PT Goal: Supine/Side to Sit - Progress: Progressing toward goal PT Goal: Sit to Stand - Progress: Progressing toward goal PT Goal: Stand to Sit - Progress: Progressing toward goal PT Goal: Ambulate - Progress: Progressing toward goal  PT Treatment Precautions/Restrictions  Precautions Precautions: Fall Precaution Comments: Pt c/o dizziness with sup - sit to EOB Required Braces or Orthoses: No Restrictions Weight Bearing Restrictions: No Mobility (including Balance) Bed Mobility Bed Mobility: Yes Supine to Sit: 4: Min assist;With rails;HOB flat (HOB 30deg) Supine to Sit Details (indicate cue type and reason): Cues for initiation and A for trunk Sitting - Scoot to Edge of Bed: 5: Supervision Sitting - Scoot to [place] of Bed Details (indicate cue type and reason): Cues to initiate movement after sitting crooked Transfers Transfers: Yes Sit to Stand: 4: Min assist;With upper extremity assist;From bed Sit to Stand Details (indicate cue type and reason): Cues for hand placement on bed and to engage RUE in activity.  Stand to Sit: 4: Min assist;With upper extremity assist;To chair/3-in-1;With armrests Stand to Sit Details: Cues for hand placement to control descent and to bring RW  throughout transfer Ambulation/Gait Ambulation/Gait: Yes Ambulation/Gait Assistance: 4: Min assist Ambulation/Gait Assistance Details (indicate cue type and reason): Initially, pt tended to sway posteriorly and remove one hand or the other from RW. Verbal cues for posture and grip placement. Pt with difficulty maintaining straight path and demonstrates increased lateral sway.  Ambulation Distance (Feet): 80 Feet Assistive device: Rolling walker Gait Pattern: Step-through pattern;Decreased stride length;Shuffle;Decreased trunk rotation;Trunk flexed Stairs: No Wheelchair Mobility Wheelchair Mobility: No  Posture/Postural Control Posture/Postural Control: No significant limitations Exercise  General Exercises - Lower Extremity Ankle Circles/Pumps: Supine;20 reps;Both;Right;AROM Quad Sets: Supine;10 reps;Both;Strengthening;AROM Long Arc Quad: Seated;10 reps;Both;Strengthening;AROM Heel Slides: Supine;10 reps;Both;Strengthening;AROM End of Session PT - End of Session Equipment Utilized During Treatment: Gait belt Activity Tolerance: Patient limited by fatigue Patient left: in chair;with call bell in reach (OT remained to change UE bandage) Nurse Communication: Mobility status for transfers;Mobility status for ambulation General Behavior During Session: [facility] for tasks performed Cognition: Impaired, at baseline  [facility] [place], [place] [phone]  11/29/2011, 10:50 AM

## 2011-11-30 DIAGNOSIS — I959 Hypotension, unspecified: Secondary | ICD-10-CM | POA: Diagnosis not present

## 2011-11-30 DIAGNOSIS — M25511 Pain in right shoulder: Secondary | ICD-10-CM | POA: Diagnosis present

## 2011-11-30 DIAGNOSIS — F29 Unspecified psychosis not due to a substance or known physiological condition: Secondary | ICD-10-CM | POA: Diagnosis not present

## 2011-11-30 LAB — GLUCOSE, CAPILLARY
Glucose-Capillary: 184 mg/dL — ABNORMAL HIGH (ref 70–99)
Glucose-Capillary: 267 mg/dL — ABNORMAL HIGH (ref 70–99)

## 2011-11-30 MED ORDER — ASPIRIN 81 MG PO CHEW: 81.0000 mg | CHEWABLE_TABLET | Freq: Every day | ORAL | Status: DC

## 2011-11-30 MED ORDER — SILVER SULFADIAZINE 1 % EX CREA: TOPICAL_CREAM | Freq: Two times a day (BID) | CUTANEOUS | Status: AC

## 2011-11-30 MED ORDER — INSULIN GLARGINE 100 UNIT/ML ~~LOC~~ SOLN: 10.0000 [IU] | Freq: Every day | SUBCUTANEOUS | Status: DC

## 2011-11-30 MED ORDER — INSULIN ASPART 100 UNIT/ML ~~LOC~~ SOLN: 6.0000 [IU] | Freq: Three times a day (TID) | SUBCUTANEOUS | Status: DC

## 2011-11-30 MED ORDER — SERTRALINE HCL 50 MG PO TABS: 50.0000 mg | ORAL_TABLET | Freq: Every day | ORAL | Status: DC

## 2011-11-30 MED ORDER — PREDNISONE 10 MG PO TABS: 10.0000 mg | ORAL_TABLET | Freq: Every day | ORAL | Status: DC

## 2011-11-30 MED ORDER — ACETAMINOPHEN 325 MG PO TABS: 650.0000 mg | ORAL_TABLET | ORAL | Status: AC | PRN

## 2011-11-30 MED ORDER — ADULT MULTIVITAMIN W/MINERALS CH: 1.0000 | ORAL_TABLET | Freq: Every day | ORAL | Status: DC

## 2011-11-30 MED ORDER — FOLIC ACID 1 MG PO TABS: 1.0000 mg | ORAL_TABLET | Freq: Every day | ORAL | Status: AC

## 2011-11-30 MED ORDER — THIAMINE HCL 100 MG PO TABS: 100.0000 mg | ORAL_TABLET | Freq: Every day | ORAL | Status: AC

## 2011-11-30 MED ORDER — ENSURE CLINICAL ST REVIGOR PO LIQD: 237.0000 mL | Freq: Three times a day (TID) | ORAL | Status: DC

## 2011-11-30 MED ORDER — INSULIN ASPART 100 UNIT/ML ~~LOC~~ SOLN: 0.0000 [IU] | Freq: Every day | SUBCUTANEOUS | Status: DC

## 2011-11-30 MED ORDER — INSULIN ASPART 100 UNIT/ML ~~LOC~~ SOLN: 0.0000 [IU] | Freq: Three times a day (TID) | SUBCUTANEOUS | Status: DC

## 2011-11-30 NOTE — Progress Notes (Signed)
Patient discussed at the Long Length of Stay Allison Caldwell Weeks 11/30/2011  

## 2011-11-30 NOTE — ED Provider Notes (Signed)
Evaluation and management procedures were performed by the resident physician under my supervision/collaboration.    Felisa Bonier, MD 11/30/11 [phone]

## 2011-11-30 NOTE — Progress Notes (Signed)
Noted order for [facility], however, pt being d/c to SNF. All needs will be provided at the SNF, any HH needs will be setup at the facility at the time of d/c from that facility.  Johny Shock RN MPH Case Manager [phone]

## 2011-11-30 NOTE — Progress Notes (Signed)
Clinical Social Work-CSW facilitated [facility] transport to [facility] with chart copy/AVS/FL2-CSW left message with pt sister per pt re: SNF choice. No further needs at time. Jodean Lima, [phone]

## 2011-11-30 NOTE — Progress Notes (Signed)
Subjective: No events overnight. Denies complaints. Ready to go to skilled nursing facility today. Has not made a decision of her she's going to, but this is anticipated early this morning.  Objective: Vital signs in last 24 hours: Filed Vitals:   11/29/11 1300 11/29/11 1710 11/29/11 2118 11/30/11 0500  BP: 131/92 143/85 134/85 134/82  Pulse: 69 63 65 63  Temp: 98.2 F (36.8 C) 98.4 F (36.9 C) 97 F (36.1 C) 98.4 F (36.9 C)  TempSrc: Oral Oral Oral Oral  Resp: 23 21 20 18   Height:      Weight:   274 lb 0.5 oz (124.3 kg)   SpO2: 99% 97% 97% 93%   Weight change: 1 lb 15.8 oz (0.9 kg)  Intake/Output Summary (Last 24 hours) at 11/30/11 0749 Last data filed at 11/29/11 1700  Gross per 24 hour  Intake    960 ml  Output      0 ml  Net    960 ml   Physical Exam:  General: Obese woman sitting in a chair. HEENT: Right pupil irregular. Left pupil round and reactive.  EOMI, no scleral icterus. No oral or occular lesions.  Cardiac: RRR, no rubs, murmurs or gallops Pulm: clear to auscultation bilaterally, moving normal volumes of air Abd: soft, nontender, nondistended, BS present Ext: Right anterior shoulder is not TTP today. She has limited Abduction of the right arm. Right forearm has a very large 12 x 5 inch debrided ulcer which appears to be healing well. All bullae are healing quite nicely with new skin covering and re pigmentation.  Tense bulla present on the palm of the right hand. Non pitting edema present in the bil feet, ankles and right hand. Neuro: cranial nerves II-XII grossly intact. No focal neurologic deficits Psych: Alert and oriented x3. Much less confusion today.  Lab Results: Basic Metabolic Panel:  Lab 11/29/11 1610 11/28/11 0600 11/26/11 0941  NA 136 137 --  K 4.2 4.3 --  CL 101 100 --  CO2 28 27 --  GLUCOSE 228* 235* --  BUN 23 23 --  CREATININE 0.79 0.74 --  CALCIUM 9.0 8.5 --  MG -- -- 1.7  PHOS -- -- --    Liver Function Tests:  Lab 11/28/11  0600 11/26/11 1707  AST 20 38*  ALT 35 49*  ALKPHOS 41 43  BILITOT 0.3 0.3  PROT 5.1* 5.8*  ALBUMIN 2.2* 2.5*   CBC:  Lab 11/27/11 0535 11/26/11 0941 11/25/11 0540  WBC 13.4* 11.0* --  NEUTROABS 10.0* -- 6.3  HGB 11.4* 11.2* --  HCT 33.1* 32.2* --  MCV 90.7 90.4 --  PLT 310 297 --   Cardiac Enzymes:  Lab 11/26/11 0941 11/24/11 0555  CKTOTAL 852* 2215*  CKMB -- --  CKMBINDEX -- --  TROPONINI -- --   CBG:  Lab 11/29/11 2115 11/29/11 1707 11/29/11 1146 11/29/11 0741 11/28/11 2158 11/28/11 1656  GLUCAP 265* 218* 244* 186* 287* 427*   Thyroid Function Tests: No results found for this basename: TSH,T4TOTAL,FREET4,T3FREE,THYROIDAB in the last 168 hours Micro Results:  Urine culture x2: 1/20 no growth Blood culture x2: 1/20 no growth AFB culture: NGTD  Studies/Results: Mr Shoulder Right Wo Contrast  11/28/2011  *RADIOLOGY REPORT*  Clinical Data: Right shoulder pain and decreased range of motion. Patient found down with mental status change after possible fall.  MRI OF THE RIGHT SHOULDER WITHOUT CONTRAST  Technique:  Multiplanar, multisequence MR imaging was performed. No intravenous contrast was administered.  Comparison: Radiographs 11/20/2011.  Findings: There is diffuse subcutaneous edema surrounding the shoulder, especially superiorly and posteriorly.  There is also diffuse muscular edema surrounding the shoulder.  The muscular edema is most impressive within the supraspinatus, infraspinatus and lateral deltoid muscles.  There is moderate associated supraspinatus and infraspinatus tendinosis.  The supraspinatus tendon demonstrates partial articular surface insertional tearing, best seen on the coronal images.  No full-thickness rotator cuff tear is demonstrated.  There is a small shoulder joint effusion with possible debris in the biceps tendon sheath.  No focal fluid is seen within the subacromial - subdeltoid bursa.  There is mild posterior subluxation of the glenohumeral  joint. There are associated mild glenohumeral degenerative changes with probable subchondral edema in the anterior inferior glenoid.  The superior labrum is degenerated without evidence of tear.  The biceps tendon is intact.  There are cystic changes in the humeral head near the rotator cuff insertion.  No Hill-Sachs deformity is seen.  The acromion is type 3 with mild lateral downsloping.  There are mild acromioclavicular degenerative changes.  IMPRESSION:  1.  Diffuse muscular edema surrounding the shoulder is suspicious for myositis or muscle infarction in this patient with elevated serum CK levels. Infection not excluded.  Correlate clinically. 2.  Associated supraspinatus and infraspinatus tendinosis with partial articular surface tearing of the supraspinatus tendon.  No full-thickness tendon tear demonstrated. 3.  Glenohumeral degenerative changes with posterior subluxation and superior labral degeneration. 4.  No evidence of acute fracture or bone destruction.  Original Report Authenticated By: Gerrianne Scale, M.D.   Medications: I have reviewed the patient's current medications. Scheduled Meds:    . aspirin  81 mg Oral Daily  . carvedilol  25 mg Oral BID WC  . diltiazem  360 mg Oral Daily  . feeding supplement  237 mL Oral TID WC  . folic acid  1 mg Oral Daily  . glipiZIDE  5 mg Oral Q lunch  . heparin  5,000 Units Subcutaneous Q8H  . insulin aspart  0-15 Units Subcutaneous TID WC  . insulin aspart  0-5 Units Subcutaneous QHS  . insulin aspart  6 Units Subcutaneous TID WC  . insulin glargine  10 Units Subcutaneous QHS  . LORazepam  2 mg Intravenous Once  . metFORMIN  500 mg Oral BID WC  . mulitivitamin with minerals  1 tablet Oral Daily  . olmesartan  40 mg Oral Daily  . predniSONE  40 mg Oral Q breakfast  . sertraline  50 mg Oral Daily  . silver sulfADIAZINE   Topical BID  . sodium chloride  1,000 mL Intravenous Once  . thiamine  100 mg Oral Daily   Continuous Infusions:     PRN Meds:.sodium chloride, acetaminophen, calcium carbonate, HYDROcodone-acetaminophen Assessment/Plan:  66 year old woman who presented with altered mental status, hypotension and rhabdomyolysis likely from prolonged entrapment.  1 hypotensive shock: Resolved. patient's blood pressure has been stable now off pressors x 7 days with stable blood pressures. Got vancomycin x 2 days and Zosyn x 4 days. Patient's right upper extremity debrided by trauma service 1/23.  Patient was on stress dose hydrocortisone 50 mg every 6 hours until January 21st. Patient missed steroid 1/22. 50 mg pred 1/23-1/26.  -- Prednisone 40 milligrams x 21/27-1/30 then taper further -- She has clinic followup with Dr. Lorretta Harp in 2 weeks ' 2) right upper extremity wound - Ms Sinatra developed new bullae formation on palms overnight 1/23. No new lesions since then. No spread of lesions observed. Lesions are  rapidly healing. The wound on her right axilla has a very well demarcated linear border which would make a systemic process unlikely. Do not believe this to be burn.  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  -- appreciate derm and ID consults -- Continue Silvadene dressings -- pain control. Will avoid narcotics as much as possible.  3) altered mental status:  Improved since transfer from the ICU. Psychiatry consulted. They initially deemed the patient is incapable of making her own medical decisions. Upon reevaluation they deemed her to have capacity. We will still pursue short-term disposition to skilled nursing facility for wound care and rehabilitation. -- Appreciate Dr. Blenda Peals assistance -- Discharge to skilled nursing facility today  4) Right shoulder pain - Right shoulder MRI shows myositis with a partial tendon tear in the rotator cuff. The partial tear is nonoperative at this time.  -- She will likely need outpatient followup with orthopedics and physical therapy.  5)  acute kidney injury. Resolved. The patient's presenting creatinine was 4.1. This trended down with aggressive fluid hydration. Creatinine now 0.74. -- Encourage by mouth intake -- Will saline lock IV.  6) rhabdomyolysis:  Resolved. Initial CK on presentation was highly elevated. This has trended down appropriately. [id number]>784 on 1/26. Given appropriate decrease in CK, we discontinued following unless creatinine dictates otherwise. -- Encourage by mouth intake  7) diabetes mellitus: On outpatient patient takes 500 mg metformin twice a day and 5 mg glipizide daily. CBGs continue to be elevated, but the patient is on prednisone at this time. Will give one day holiday today from car modified diet so she can have a birthday cake. -- carb modified diet -- 10 units Lantus each bedtime -- 5 units novolog meal coverage while on steroids -- Will restart metformin and glipizide today. -- Sliding scale insulin moderate + HS coverage -- Dictated discharge summary that insulin requirements will likely be decreased as steroids are tapered.  8) CARDIOMYOPATHY / SVT- EF 54% with hypokinetic inferior and inferolateral left ventricular walls with ejection fraction of 54% per nuclear perfusion study performed on June 2011. This is likely exacerbated by her alcohol intake. -- Aspirin 81 mg daily -- carvedilol 25 mg BID -- diltiazem 360 mg daily. -- restarted olmesartan 40 mg daily today  -- Social work consult for alcohol cessation  9) alcohol abuse and social problems: Social work consulted and is following the patient.  -- discontinued CIWA the patient is outside the window for DTs -- Thiamine, folate, multivitamin  DVT prophylaxis: Heparin   LOS: 10 days   Allison Caldwell 11/30/2011, 7:49 AM

## 2011-11-30 NOTE — Progress Notes (Signed)
Clinical Social Work-CSW me with pt who relayed she chooses [employer] [facility]; CSW left message for pt sister in law per pt request. CSW notified facility and will arrange d/c once orders complete-Alexei Ey-MSW, [phone]

## 2011-11-30 NOTE — Progress Notes (Signed)
Internal Medicine Attending  Date: 11/30/2011  Patient name: Allison Caldwell Medical record number: [id number] Date of birth: [birth year] Age: 66 y.o. Gender: female  I saw and evaluated the patient. I reviewed the resident's note by Dr. Candy Sledge and I agree with the resident's findings and plans as documented in his note.
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
## 2011-12-02 DIAGNOSIS — M25519 Pain in unspecified shoulder: Secondary | ICD-10-CM | POA: Diagnosis not present

## 2011-12-02 DIAGNOSIS — R579 Shock, unspecified: Secondary | ICD-10-CM | POA: Diagnosis not present

## 2011-12-02 DIAGNOSIS — I10 Essential (primary) hypertension: Secondary | ICD-10-CM | POA: Diagnosis not present

## 2011-12-02 DIAGNOSIS — F411 Generalized anxiety disorder: Secondary | ICD-10-CM | POA: Diagnosis not present

## 2011-12-02 DIAGNOSIS — L139 Bullous disorder, unspecified: Secondary | ICD-10-CM | POA: Diagnosis not present

## 2011-12-02 DIAGNOSIS — E139 Other specified diabetes mellitus without complications: Secondary | ICD-10-CM | POA: Diagnosis not present

## 2011-12-13 ENCOUNTER — Encounter: Payer: Self-pay | Admitting: Internal Medicine

## 2011-12-13 ENCOUNTER — Ambulatory Visit (INDEPENDENT_AMBULATORY_CARE_PROVIDER_SITE_OTHER): Payer: Medicare Other | Admitting: Internal Medicine

## 2011-12-13 VITALS — BP 124/74 | HR 72 | Temp 98.6°F | Wt 256.7 lb

## 2011-12-13 DIAGNOSIS — Z79899 Other long term (current) drug therapy: Secondary | ICD-10-CM

## 2011-12-13 DIAGNOSIS — E119 Type 2 diabetes mellitus without complications: Secondary | ICD-10-CM | POA: Diagnosis not present

## 2011-12-13 LAB — COMPLETE METABOLIC PANEL WITH GFR
ALT: 12 U/L (ref 0–35)
AST: 10 U/L (ref 0–37)
Albumin: 3.6 g/dL (ref 3.5–5.2)
Alkaline Phosphatase: 36 U/L — ABNORMAL LOW (ref 39–117)
BUN: 10 mg/dL (ref 6–23)
CO2: 29 mEq/L (ref 19–32)
Calcium: 8.8 mg/dL (ref 8.4–10.5)
Chloride: 103 mEq/L (ref 96–112)
Creat: 0.57 mg/dL (ref 0.50–1.10)
GFR, Est African American: >89 mL/min
GFR, Est Non African American: >89 mL/min
Glucose, Bld: 66 mg/dL — ABNORMAL LOW (ref 70–99)
Potassium: 3.7 mEq/L (ref 3.5–5.3)
Sodium: 142 mEq/L (ref 135–145)
Total Bilirubin: 0.4 mg/dL (ref 0.3–1.2)
Total Protein: 5.7 g/dL — ABNORMAL LOW (ref 6.0–8.3)

## 2011-12-13 LAB — GLUCOSE, CAPILLARY: Glucose-Capillary: 104 mg/dL — ABNORMAL HIGH (ref 70–99)

## 2011-12-13 LAB — POCT GLYCOSYLATED HEMOGLOBIN (HGB A1C): Hemoglobin A1C: 6.5

## 2011-12-13 NOTE — Patient Instructions (Addendum)
Recommendations to the physician in SNF:  1. Appreciate your excellent management of [name]. Patient is recovering well.  2. I discussed  these recommendations with Dr. Renato Gails on the phone.  A. Regarding [name]: patient's CBG is 104 and A1c is 6.5. Her prednisone was stopped. We think that her insulin dose may need to be decreased. Specifically, decrease Lantus from 10 U daily to 5 Units daily. Stop her meal coverage of Novolog. May consider SSI if neccessary. Continue her Metformin and glipizide.   B. Regarding her shoulder pain: we think she may need to have an Orthopedic referral for further evaluation and treatment.  C. Patient reports having heart burning. She is on Naproxen for her shoulder pain. She may need to take PPI, such as Omeprazole.  D. If patient leaves your facility for home in the future, it would be greatly appreciated if you can let us known and make an appointment with [place] for follow up care.  E. Thanks for your consideration.  Lorretta Harp, MD

## 2011-12-13 NOTE — Progress Notes (Signed)
Subjective:   Patient ID: Allison Caldwell female   DOB: Nov 09, 1945 66 y.o.   MRN: [id number]  HPI: Allison Caldwell is a 66 y.o. with PMH as outlined below, who presents for a hospital follow up visit today. Patient was hospitalized from 11/20/11 to 11/30/11 due to hypotensive shock, right arm bullae, rhabdomyolysis and acute kidney injury. She was successfully treated with IVF, antibiotics and other necessary managements in the hospital and discharged at stable condition to SNF. She comes in from [facility]. Today, patient reports that she feels much better in general after being discharged. Her bullae in her right arm is healing well. The only new issue is that she has heart burning, particularly when she feels hungry. She still has pain in her right shoulder and numbness in her right side of neck, arm and hand. She feels weak in her left arm and hand.  Denies fever, chills, headaches,  cough, chest pain, SOB,  abdominal pain,diarrhea, constipation, dysuria, urgency, frequency, hematuria.  Past Medical History  Diagnosis Date  . Hypertension   . Diabetes mellitus     NIDDM  . Obesities, morbid   . TIA (transient ischemic attack)   . Depression   . Degenerative joint disease   . ETOH abuse   . Cardiomyopathy    Current Outpatient Prescriptions  Medication Sig Dispense Refill  . acetaminophen (TYLENOL) 325 MG tablet Take 2 tablets (650 mg total) by mouth every 4 (four) hours as needed (pain).      Marland Kitchen aspirin 81 MG chewable tablet Chew 1 tablet (81 mg total) by mouth daily.      . carvedilol (COREG) 25 MG tablet Take 25 mg by mouth 2 (two) times daily with meals.        Marland Kitchen DILTIAZEM HCL COATED BEADS PO Take 360 mg by mouth daily. (Cardizem CD 360 mg  XR 24H-CAP)      . folic acid (FOLVITE) 1 MG tablet Take 1 tablet (1 mg total) by mouth daily.      Marland Kitchen glipiZIDE (GLUCOTROL) 5 MG tablet Take 5 mg by mouth daily.        . insulin aspart (NOVOLOG) 100 UNIT/ML injection Inject 0-15 Units into the skin 3 (three)  times daily with meals.  1 vial    . insulin aspart (NOVOLOG) 100 UNIT/ML injection Inject 0-5 Units into the skin at bedtime.  1 vial    . insulin aspart (NOVOLOG) 100 UNIT/ML injection Inject 6 Units into the skin 3 (three) times daily with meals.  1 vial    . insulin glargine (LANTUS) 100 UNIT/ML injection Inject 10 Units into the skin at bedtime.  10 mL    . metFORMIN (GLUCOPHAGE) 500 MG tablet Take 500 mg by mouth 2 (two) times daily.        . Multiple Vitamin (MULITIVITAMIN WITH MINERALS) TABS Take 1 tablet by mouth daily.      . naproxen (NAPROSYN) 500 MG tablet Take 500 mg by mouth 2 (two) times daily with a meal.      . olmesartan (BENICAR) 40 MG tablet Take 40 mg by mouth daily.        . predniSONE (DELTASONE) 10 MG tablet Take 1 tablet (10 mg total) by mouth daily. From 1/31-2/3 take 30 mg (3 tabs) by mouth every morning. From 2/4-2/7 take 20 mg (2 tabs) by mouth every morning. From 2/8-2/11 take 10 mg (1 tab) by mouth every morning. Then stop on February 12.      . sertraline (ZOLOFT)  50 MG tablet Take 1 tablet (50 mg total) by mouth daily. On February 4 increase dose to 2 tablets (100 mg total) by mouth daily.      . silver sulfADIAZINE (SILVADENE) 1 % cream Apply topically 2 (two) times daily.  50 g    . thiamine 100 MG tablet Take 1 tablet (100 mg total) by mouth daily.      . feeding supplement (ENSURE CLINICAL STRENGTH) LIQD Take 237 mLs by mouth 3 (three) times daily with meals.       Family History  Problem Relation Age of Onset  . Heart disease Mother   . Cancer Father   . Diabetes Sister   . Kidney disease Brother   . Stroke Brother    History   Social History  . Marital Status: Single    Spouse Name: N/A    Number of Children: N/A  . Years of Education: N/A   Occupational History  . House keeping-retired since 2006    Social History Main Topics  . Smoking status: Former Smoker -- 0.5 packs/day    Types: Cigarettes  . Smokeless tobacco: None   Comment: used  to smoke 1/2 pk aweek; quit 6/11.  Marland Kitchen Alcohol Use: 0.0 oz/week     intoxicated at this time  . Drug Use: No     THC occasionally.  . Sexually Active: Yes   Other Topics Concern  . None   Social History Narrative   Lives in [place].   Review of Systems:  General: no fevers, chills Skin: has a healing scar in the right lateral forearm.  HEENT: no blurry vision, hearing changes or sore throat Pulm: no dyspnea, coughing, wheezing CV: no chest pain, palpitations, shortness of breath Abd: no nausea/vomiting, abdominal pain, diarrhea/constipation GU: no dysuria, hematuria, polyuria Ext: has pain in right shoulder and numbness in the right arm and hand Neuro: no weakness, numbness, or tingling   Objective:  Physical Exam: Filed Vitals:   12/13/11 1058  BP: 124/74  Pulse: 72  Temp: 98.6 F (37 C)  TempSrc: Oral  Weight: 256 lb 11.2 oz (116.438 kg)   General: not in acute distress HEENT: PERRL, EOMI, no scleral icterus Cardiac: S1/S2, RRR, No murmurs, gallops or rubs Pulm: Good air movement bilaterally, Clear to auscultation bilaterally, No rales, wheezing, rhonchi or rubs. Abd: Soft,  nondistended, nontender, no rebound pain, no organomegaly, BS present Ext:  2+DP/PT pulse bilaterally. Tender over right shoulder. Has numbness in right side of neck, arm and hand. Has movement limitation of right arm due to shoulder pain. Neuro: alert and oriented X3, cranial nerves II-XII grossly intact, muscle strength is decreased in right arm and hand. sensation to light touch intact.   Assessment & Plan:   Since patent is a resident of SNF. She has physician managing her medical problems. We would like to let her current physician to manage her medical issues. We would like to give our recommendations to her physician in SNF. I call her physician in SNF, Dr. Renato Gails. We discussed our recommendations on the phone. I will check her CMP today and forward the results to SNF when the results are  available.  Recommendations are as follows:  1. Appreciate your excellent management of Allison Caldwell. Patient is recovering well.  2. I discussed  these recommendations with Dr. Renato Gails on the phone as follows:  A. Regarding [name]: patient's CBG is 104 and A1c is 6.5 today. Her prednisone was stopped. We think that her  insulin dose may need to be decreased. Specifically, decrease Lantus from 10 U daily to 5 Units daily. Stop her bed time and meal coverage of Novolog. May consider SSI if necessary. Continue her Metformin and glipizide.   B. Regarding her shoulder pain: we think she may need to have an Orthopedic referral for further evaluation and treatment.  C. Patient reports having heart burning. She is on Naproxen for her shoulder pain. She may need to take PPI, such as Omeprazole.  D. If patient leaves SNF facility for home in the future, it would be greatly appreciated if SNF can let us known and make an appointment with [place] for follow up care.

## 2011-12-14 ENCOUNTER — Encounter: Payer: Self-pay | Admitting: Internal Medicine

## 2011-12-14 DIAGNOSIS — M25539 Pain in unspecified wrist: Secondary | ICD-10-CM | POA: Diagnosis not present

## 2011-12-14 DIAGNOSIS — M25529 Pain in unspecified elbow: Secondary | ICD-10-CM | POA: Diagnosis not present

## 2011-12-14 NOTE — Progress Notes (Signed)
I saw patient and discussed her care with resident Dr. Niu.  I agree with the clinical findings and plans as outlined in his note. 

## 2011-12-19 ENCOUNTER — Other Ambulatory Visit (HOSPITAL_BASED_OUTPATIENT_CLINIC_OR_DEPARTMENT_OTHER): Payer: Self-pay | Admitting: Internal Medicine

## 2011-12-19 DIAGNOSIS — N63 Unspecified lump in unspecified breast: Secondary | ICD-10-CM

## 2011-12-27 ENCOUNTER — Ambulatory Visit
Admission: RE | Admit: 2011-12-27 | Discharge: 2011-12-27 | Disposition: A | Discharge status: Home / Self Care | Payer: Medicare Other | Source: Ambulatory Visit | Attending: Internal Medicine | Admitting: Internal Medicine

## 2011-12-27 DIAGNOSIS — N63 Unspecified lump in unspecified breast: Secondary | ICD-10-CM

## 2011-12-27 DIAGNOSIS — S20219A Contusion of unspecified front wall of thorax, initial encounter: Secondary | ICD-10-CM | POA: Diagnosis not present

## 2012-01-02 LAB — AFB CULTURE, BLOOD

## 2012-01-10 ENCOUNTER — Telehealth: Payer: Self-pay | Admitting: *Deleted

## 2012-01-10 ENCOUNTER — Ambulatory Visit (INDEPENDENT_AMBULATORY_CARE_PROVIDER_SITE_OTHER): Payer: Medicare Other | Admitting: Internal Medicine

## 2012-01-10 ENCOUNTER — Encounter: Payer: Self-pay | Admitting: Internal Medicine

## 2012-01-10 DIAGNOSIS — M6282 Rhabdomyolysis: Secondary | ICD-10-CM | POA: Diagnosis not present

## 2012-01-10 DIAGNOSIS — R209 Unspecified disturbances of skin sensation: Secondary | ICD-10-CM

## 2012-01-10 DIAGNOSIS — I10 Essential (primary) hypertension: Secondary | ICD-10-CM | POA: Diagnosis not present

## 2012-01-10 DIAGNOSIS — E119 Type 2 diabetes mellitus without complications: Secondary | ICD-10-CM | POA: Diagnosis not present

## 2012-01-10 LAB — GLUCOSE, CAPILLARY: Glucose-Capillary: 86 mg/dL (ref 70–99)

## 2012-01-10 NOTE — Telephone Encounter (Signed)
Pt called with c/o pain to Rt arm.  She reports that she  passed out on January 15th and laid on floor for 6 days.  She had blisters under arm.  She was treated with Silvadine but still is red and painful. Pt scheduled for today with Dr Clyde Lundborg.

## 2012-01-10 NOTE — Patient Instructions (Signed)
1. Please take all medications as prescribed.  2. If you have worsening of your symptoms or new symptoms arise, please call the clinic ([phone]), or go to the ER immediately if symptoms are severe. 

## 2012-01-10 NOTE — Progress Notes (Signed)
Subjective:   Patient ID: Allison Caldwell female   DOB: October 09, 1946 66 y.o.   MRN: [id number]  HPI: Allison Caldwell is a 66 y.o. with PMH as outlined below, who presents for a follow up visit today. Patient was hospitalized from 11/20/11 to 11/30/11 due to hypotensive shock, right arm bullae, rhabdomyolysis and acute kidney injury. She was successfully treated in the hospital and discharged at stable condition to SNF. She recovered well and was discharged home recently.   Patient reports that she has numbness in her right side of neck, arm and hand. She feels weak in her right arm and hand. She does not have pain in her arm and hand. Her hand weakness is worse in first two fingers of right hand. She usually uses her third, fourth and fifth fingers to pick up or hold things in right hand.  Regarding [name], patient reports that her medications were adjusted in SNF. Her insulin was stopped. She is currently taking two medications, one is metformin 500 mg bid and another one is likely to be Glipizide. She did not bring in her medication bottles with her today and is not very sure whether the second medication is glipizide.   Her bullae in her right arm is healing well except for one small area at lateral side of right forearm (1 X 1.3 cm).  Denies fever, chills, headaches, cough, chest pain, SOB, abdominal pain,diarrhea, constipation, dysuria, urgency, frequency, hematuria.  Past Medical History  Diagnosis Date  . Hypertension   . Diabetes mellitus     NIDDM  . Obesities, morbid   . TIA (transient ischemic attack)   . Depression   . Degenerative joint disease   . ETOH abuse   . Cardiomyopathy    Current Outpatient Prescriptions  Medication Sig Dispense Refill  . acetaminophen (TYLENOL) 325 MG tablet Take 2 tablets (650 mg total) by mouth every 4 (four) hours as needed (pain).      Marland Kitchen aspirin 81 MG chewable tablet Chew 1 tablet (81 mg total) by mouth daily.      . carvedilol (COREG) 25 MG tablet  Take 25 mg by mouth 2 (two) times daily with a meal.       . metFORMIN (GLUCOPHAGE) 500 MG tablet Take 500 mg by mouth 2 (two) times daily.        . Multiple Vitamin (MULITIVITAMIN WITH MINERALS) TABS Take 1 tablet by mouth daily.      . sertraline (ZOLOFT) 50 MG tablet Take 1 tablet (50 mg total) by mouth daily. On February 4 increase dose to 2 tablets (100 mg total) by mouth daily.      . silver sulfADIAZINE (SILVADENE) 1 % cream Apply topically 2 (two) times daily.  50 g    . DILTIAZEM HCL COATED BEADS PO Take 420 mg by mouth daily. (Cardizem CD 360 mg  XR 24H-CAP)      . folic acid (FOLVITE) 1 MG tablet Take 1 tablet (1 mg total) by mouth daily.      Marland Kitchen glipiZIDE (GLUCOTROL) 5 MG tablet Take 5 mg by mouth daily.        Marland Kitchen thiamine 100 MG tablet Take 1 tablet (100 mg total) by mouth daily.       Family History  Problem Relation Age of Onset  . Heart disease Mother   . Cancer Father   . Diabetes Sister   . Kidney disease Brother   . Stroke Brother    History   Social History  .  Marital Status: Single    Spouse Name: N/A    Number of Children: N/A  . Years of Education: N/A   Occupational History  . House keeping-retired since 2006    Social History Main Topics  . Smoking status: Former Smoker -- 0.5 packs/day    Types: Cigarettes  . Smokeless tobacco: None   Comment: used to smoke 1/2 pk aweek; quit 6/11.  Marland Kitchen Alcohol Use: 0.0 oz/week     intoxicated at this time  . Drug Use: No     THC occasionally.  . Sexually Active: Yes   Other Topics Concern  . None   Social History Narrative   Lives in [place].   Review of Systems: General: no fevers, chills Skin: has a healing scar in the right lateral forearm.  HEENT: no blurry vision, hearing changes or sore throat Pulm: no dyspnea, coughing, wheezing CV: no chest pain, palpitations, shortness of breath Abd: no nausea/vomiting, abdominal pain, diarrhea/constipation GU: no dysuria, hematuria, polyuria Ext: has numbness  in the right neck, shoulder, arm and hand Neuro: no weakness, numbness, or tingling   Objective:  Physical Exam: Filed Vitals:   01/10/12 1449  BP: 134/88  Pulse: 71  Temp: 97.8 F (36.6 C)  TempSrc: Oral  Height: 5\' 2"  (1.575 m)  Weight: 256 lb (116.121 kg)  SpO2: 98%   General: not in acute distress HEENT: PERRL, EOMI, no scleral icterus  Cardiac: S1/S2, RRR, No murmurs, gallops or rubs  Pulm: Good air movement bilaterally, Clear to auscultation bilaterally, No rales, wheezing, rhonchi or rubs.  Abd: Soft, nondistended, nontender, no rebound pain, no organomegaly, BS present  Ext: 2+DP/PT pulse bilaterally. Has numbness in right side of neck, arm and hand. Has movement limitation of right arm due to shoulder pain.  Neuro: alert and oriented X3, cranial nerves II-XII grossly intact, muscle strength is decreased in right arm and hand. sensation to light touch intact.  Assessment & Plan:   # Numbness in right neck, shoulder, arm and hand, as well as weakness in right arm and hand: It is likely due to compression or impingement of brachial plexus. Patient did not have MRI for her neck recently. 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 She has an appointment with Orthopedic on March 27. Will follow up the recommendation.  Patient may need to have a MRI evaluation.   # DM-II: her A1c was 6.5. Her CBG is 86 today. She did not symptoms of hypoglycemia. Will try to get her SNF record.  Will continue her current regimen before obtained the record. Will check her Lipid panel today.  # HTN: her blood pressure is well controlled. She is taking diltiazem 420 mg daily and carvedilol 25 mg bid. She may be benefited from ARB (she is allergic to ACE). It is not clear why patient stopped taking ARB. In fact, she was discharged on olmesartan according to the d/c summary on 11/30/11. Will check her microalbumin/Cre ratio, if elevated in  2 out 3 times, will consider to add ARB.  # Rhabdomyolysis: her Cre was normal on 12/13/11. Total CK was trending down from 16589 on 1/20 to 852 on 11/26/11. Will check it again to make sure her CK is back to normal.  # HM: patient will have eye exam possibly in April. She will call for appointment by herself. patient refused to do Flu shot, colonoscopy and pneumococcal vaccine today. Will postpone these.   Lorretta Harp

## 2012-01-11 LAB — LIPID PANEL
Cholesterol: 230 mg/dL — ABNORMAL HIGH (ref 0–200)
HDL: 90 mg/dL (ref >39)
LDL Cholesterol: 124 mg/dL — ABNORMAL HIGH (ref 0–99)
Total CHOL/HDL Ratio: 2.6 Ratio
Triglycerides: 78 mg/dL (ref <150)
VLDL: 16 mg/dL (ref 0–40)

## 2012-01-11 LAB — CK: Total CK: 69 U/L (ref 7–177)

## 2012-01-11 LAB — MICROALBUMIN / CREATININE URINE RATIO
Creatinine, Urine: 67 mg/dL
Microalb Creat Ratio: 7.5 mg/g (ref 0.0–30.0)
Microalb, Ur: 0.5 mg/dL (ref 0.00–1.89)

## 2012-01-25 DIAGNOSIS — M25529 Pain in unspecified elbow: Secondary | ICD-10-CM | POA: Diagnosis not present

## 2012-01-25 DIAGNOSIS — M25539 Pain in unspecified wrist: Secondary | ICD-10-CM | POA: Diagnosis not present

## 2012-02-01 ENCOUNTER — Other Ambulatory Visit: Payer: Self-pay | Admitting: *Deleted

## 2012-02-01 MED ORDER — DILTIAZEM HCL ER COATED BEADS 420 MG PO TB24: 420.0000 mg | ORAL_TABLET | Freq: Every day | ORAL | Status: DC

## 2012-02-01 MED ORDER — SERTRALINE HCL 100 MG PO TABS: 100.0000 mg | ORAL_TABLET | Freq: Every day | ORAL | Status: DC

## 2012-02-01 MED ORDER — METFORMIN HCL 500 MG PO TABS: 500.0000 mg | ORAL_TABLET | Freq: Two times a day (BID) | ORAL | Status: DC

## 2012-02-01 NOTE — Telephone Encounter (Signed)
Please review all directions for use of the requested medications and insert complete dosage instructions, thank you

## 2012-02-01 NOTE — Telephone Encounter (Signed)
Patient is currently not on Benicar (olmesartan).

## 2012-02-02 ENCOUNTER — Other Ambulatory Visit: Payer: Self-pay | Admitting: *Deleted

## 2012-02-02 DIAGNOSIS — I10 Essential (primary) hypertension: Secondary | ICD-10-CM

## 2012-02-02 MED ORDER — ASPIRIN 81 MG PO CHEW: 81.0000 mg | CHEWABLE_TABLET | Freq: Every day | ORAL | Status: AC

## 2012-02-02 MED ORDER — CARVEDILOL 25 MG PO TABS: 25.0000 mg | ORAL_TABLET | Freq: Two times a day (BID) | ORAL | Status: DC

## 2012-02-02 MED ORDER — GLIPIZIDE 5 MG PO TABS: 5.0000 mg | ORAL_TABLET | Freq: Every day | ORAL | Status: DC

## 2012-02-02 NOTE — Telephone Encounter (Signed)
Also need refill on Lantus vial 100u/ml qty 10  At [facility]. Last refill 12/29/11.

## 2012-04-10 ENCOUNTER — Encounter: Payer: Self-pay | Admitting: Internal Medicine

## 2012-04-10 ENCOUNTER — Ambulatory Visit (INDEPENDENT_AMBULATORY_CARE_PROVIDER_SITE_OTHER): Payer: Medicare Other | Admitting: Internal Medicine

## 2012-04-10 VITALS — BP 156/85 | HR 75 | Temp 98.8°F | Ht 62.0 in | Wt 259.1 lb

## 2012-04-10 DIAGNOSIS — R200 Anesthesia of skin: Secondary | ICD-10-CM

## 2012-04-10 DIAGNOSIS — E785 Hyperlipidemia, unspecified: Secondary | ICD-10-CM

## 2012-04-10 DIAGNOSIS — R209 Unspecified disturbances of skin sensation: Secondary | ICD-10-CM | POA: Diagnosis not present

## 2012-04-10 DIAGNOSIS — E119 Type 2 diabetes mellitus without complications: Secondary | ICD-10-CM

## 2012-04-10 DIAGNOSIS — I10 Essential (primary) hypertension: Secondary | ICD-10-CM

## 2012-04-10 LAB — POCT GLYCOSYLATED HEMOGLOBIN (HGB A1C): Hemoglobin A1C: 6.4

## 2012-04-10 LAB — GLUCOSE, CAPILLARY: Glucose-Capillary: 107 mg/dL — ABNORMAL HIGH (ref 70–99)

## 2012-04-10 MED ORDER — PRAVASTATIN SODIUM 20 MG PO TABS: 20.0000 mg | ORAL_TABLET | Freq: Every day | ORAL | Status: DC

## 2012-04-10 MED ORDER — OLMESARTAN MEDOXOMIL 20 MG PO TABS: 20.0000 mg | ORAL_TABLET | Freq: Every day | ORAL | Status: DC

## 2012-04-10 NOTE — Patient Instructions (Signed)
1. You have done great job in taking all your medications. I appreciate it very much. Please continue doing that. 2. Please take all medications as prescribed.  3.  Your blood pressure is not controlled well. I added one more medication to your regimen, Benecar. Please take it from now. You need to check your kidney function at 2 weeks after starting this medication. I will schedule a lab test in 2 weeks. Pleases come to the lab for the test.  4. Your cholesterol is high. I prescribed Pravastatin for you today. Please take it from now. 3. If you have worsening of your symptoms or new symptoms arise, please call the clinic ([phone]), or go to the ER immediately if symptoms are severe.

## 2012-04-10 NOTE — Progress Notes (Signed)
Subjective:   Patient ID: Allison Caldwell female   DOB: 10-29-1946 66 y.o.   MRN: [id number]  HPI: [name] is a 66 y.o.  with PMH as outlined below, who presents for a follow up visit and also with right arm and hand numbness and weakness.    Patient was hospitalized from 11/20/11 to 11/30/11 due to hypotensive shock, right arm bullae, rhabdomyolysis and acute kidney injury. She was found wedged between her bed and her bedroom wall with her right side dependent prior to that hospitalization. She was successfully treated in the hospital. After that, she has been having numbness in her right side of neck, arm and hand. She feels weak in her right arm and hand. She does not have pain in her arm and hand. Her muscle weakness in her arm and hand is improving, but the numbness persists. Her CT-neck on 1/13 showed spondylosis with disc space loss and uncinate spurring (most advanced at C4-C5 and C5-C6).   Denies fever, chills, headaches, cough, chest pain, SOB, abdominal pain, leg edema, diarrhea, constipation, dysuria, urgency, frequency, hematuria.  Past Medical History  Diagnosis Date  . Hypertension   . Diabetes mellitus     NIDDM  . Obesities, morbid   . TIA (transient ischemic attack)   . Depression   . Degenerative joint disease   . ETOH abuse   . Cardiomyopathy    Current Outpatient Prescriptions  Medication Sig Dispense Refill  . acetaminophen (TYLENOL) 325 MG tablet Take 2 tablets (650 mg total) by mouth every 4 (four) hours as needed (pain).      Marland Kitchen aspirin 81 MG chewable tablet Chew 1 tablet (81 mg total) by mouth daily.  30 tablet  5  . carvedilol (COREG) 25 MG tablet Take 1 tablet (25 mg total) by mouth 2 (two) times daily with a meal.  60 tablet  3  . diltiazem (CARDIZEM LA) 420 MG 24 hr tablet Take 1 tablet (420 mg total) by mouth daily.  30 tablet  5  . glipiZIDE (GLUCOTROL) 5 MG tablet Take 1 tablet (5 mg total) by mouth daily.  90 tablet  3  . metFORMIN (GLUCOPHAGE) 500 MG  tablet Take 1 tablet (500 mg total) by mouth 2 (two) times daily.  60 tablet  5  . Multiple Vitamin (MULITIVITAMIN WITH MINERALS) TABS Take 1 tablet by mouth daily.      . sertraline (ZOLOFT) 100 MG tablet Take 1 tablet (100 mg total) by mouth daily. Take 1 tablet daily by mouth  30 tablet  5  . folic acid (FOLVITE) 1 MG tablet Take 1 tablet (1 mg total) by mouth daily.      Marland Kitchen olmesartan (BENICAR) 20 MG tablet Take 1 tablet (20 mg total) by mouth daily.  30 tablet  5  . olmesartan (BENICAR) 40 MG tablet Take 40 mg by mouth daily.        . pravastatin (PRAVACHOL) 20 MG tablet Take 1 tablet (20 mg total) by mouth daily.  30 tablet  5  . silver sulfADIAZINE (SILVADENE) 1 % cream Apply topically 2 (two) times daily.  50 g    . thiamine 100 MG tablet Take 1 tablet (100 mg total) by mouth daily.       Family History  Problem Relation Age of Onset  . Heart disease Mother   . Cancer Father   . Diabetes Sister   . Kidney disease Brother   . Stroke Brother    History   Social History  .  Marital Status: Single    Spouse Name: N/A    Number of Children: N/A  . Years of Education: N/A   Occupational History  . House keeping-retired since 2006    Social History Main Topics  . Smoking status: Former Smoker -- 0.5 packs/day    Types: Cigarettes  . Smokeless tobacco: None   Comment: used to smoke 1/2 pk aweek; quit 6/11.  Marland Kitchen Alcohol Use: 0.0 oz/week     intoxicated at this time  . Drug Use: No     THC occasionally.  . Sexually Active: Yes   Other Topics Concern  . None   Social History Narrative   Lives in [place].   Review of Systems:  General: no fevers, chills, no changes in body weight, no changes in appetite Skin: no rash HEENT: no blurry vision, hearing changes or sore throat Pulm: no dyspnea, coughing, wheezing CV: no chest pain, palpitations, shortness of breath Abd: no nausea/vomiting, abdominal pain, diarrhea/constipation GU: no dysuria, hematuria, polyuria Ext: no  arthralgias, myalgias Neuro: Neurological: has weakness and numbness on right arm, shoulder and hands, Denies dizziness, seizures, syncope, weakness, light-headedness. Skin: Denies pallor, rash and wound.  Hematological: Denies adenopathy. Easy bruising, personal or family bleeding history  Psychiatric/Behavioral: Denies suicidal ideation, mood changes, confusion, nervousness, sleep disturbance and agitation  Objective:  Physical Exam: Filed Vitals:   04/10/12 1530  BP: 156/85  Pulse: 75  Temp: 98.8 F (37.1 C)  TempSrc: Oral  Height: 5\' 2"  (1.575 m)  Weight: 259 lb 1.6 oz (117.527 kg)   General: resting in bed, not in acute distress HEENT: PERRL, EOMI, no scleral icterus Cardiac: S1/S2, RRR, No murmurs, gallops or rubs Pulm: Good air movement bilaterally, Clear to auscultation bilaterally, No rales, wheezing, rhonchi or rubs. Abd: Soft,  nondistended, nontender, no rebound pain, no organomegaly, BS present Ext: No rashes or edema, 2+DP/PT pulse bilaterally Skin: no rashes. No skin bruise. Neuro: alert and oriented X3, cranial nerves II-XII grossly intact, muscle strength 4/5 in right arm and hand and 5/5 in all other extremeties,  sensation to light touch intact. Brachial reflex is 1+ bilaterally and is symmetric on both sides. Psych.: patient is not psychotic, no suicidal or hemocidal ideation.   Assessment & Plan:   # Numbness in right neck, shoulder, arm and hand, as well as weakness in right arm and hand: It is likely due to compression or impingement of brachial plexus. 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 She saw Orthopedic on March, but only had some general recommendations which did not help per patient. although her weakness has slightly improved, her numbness did not improve at all. Her muscle strength is still weaker in right arm and hand. Will get C-spine MRI.  # DM-II: her A1c was 6.4  today. Will  continue her current regimen.  # HTN: her blood pressure is not well controlled. Today Bp is 156/85. She is taking diltiazem 420 mg daily and carvedilol 25 mg bid which is also for her PSVT and cardiomyopathy. Will add Benecar today. Will check her BMP in 2 weeks.  # Rhabdomyolysis: resolved. Her Cre was normal on 12/13/11. Total CK was 69 on 01/10/12.  # HLD: her LDL was 124 on 01/10/12. Her goal should be ideally LDL< 70.  Will start pravastatin and check her lipid in 6 weeks.   # HM:  - will give referral to ophthalmology. - patient refused to do colonoscopy and  pneumococcal vaccine today. Will postpone these.    Lorretta Harp, MD PGY1, Internal Medicine Teaching Service Pager: [phone]

## 2012-04-30 ENCOUNTER — Telehealth: Payer: Self-pay | Admitting: Dietician

## 2012-05-08 ENCOUNTER — Other Ambulatory Visit (INDEPENDENT_AMBULATORY_CARE_PROVIDER_SITE_OTHER): Payer: Medicare Other

## 2012-05-08 DIAGNOSIS — H40019 Open angle with borderline findings, low risk, unspecified eye: Secondary | ICD-10-CM | POA: Diagnosis not present

## 2012-05-08 DIAGNOSIS — H2510 Age-related nuclear cataract, unspecified eye: Secondary | ICD-10-CM | POA: Diagnosis not present

## 2012-05-08 DIAGNOSIS — I10 Essential (primary) hypertension: Secondary | ICD-10-CM

## 2012-05-08 DIAGNOSIS — H35039 Hypertensive retinopathy, unspecified eye: Secondary | ICD-10-CM | POA: Diagnosis not present

## 2012-05-08 DIAGNOSIS — Z961 Presence of intraocular lens: Secondary | ICD-10-CM | POA: Diagnosis not present

## 2012-05-09 LAB — BASIC METABOLIC PANEL WITH GFR
BUN: 12 mg/dL (ref 6–23)
CO2: 27 mEq/L (ref 19–32)
Calcium: 9.2 mg/dL (ref 8.4–10.5)
Chloride: 106 mEq/L (ref 96–112)
Creat: 0.62 mg/dL (ref 0.50–1.10)
GFR, Est African American: >89 mL/min
GFR, Est Non African American: >89 mL/min
Glucose, Bld: 77 mg/dL (ref 70–99)
Potassium: 4 mEq/L (ref 3.5–5.3)
Sodium: 143 mEq/L (ref 135–145)

## 2012-05-15 NOTE — Telephone Encounter (Signed)
Left second message about diabetes eye exam date.

## 2012-05-22 ENCOUNTER — Ambulatory Visit (HOSPITAL_COMMUNITY)
Admission: RE | Admit: 2012-05-22 | Discharge: 2012-05-22 | Disposition: A | Discharge status: Home / Self Care | Payer: Medicare Other | Source: Ambulatory Visit | Attending: Internal Medicine | Admitting: Internal Medicine

## 2012-05-22 DIAGNOSIS — M4802 Spinal stenosis, cervical region: Secondary | ICD-10-CM | POA: Diagnosis not present

## 2012-05-22 DIAGNOSIS — M79609 Pain in unspecified limb: Secondary | ICD-10-CM | POA: Diagnosis not present

## 2012-05-22 DIAGNOSIS — Q898 Other specified congenital malformations: Secondary | ICD-10-CM | POA: Diagnosis not present

## 2012-05-22 DIAGNOSIS — R200 Anesthesia of skin: Secondary | ICD-10-CM

## 2012-05-22 DIAGNOSIS — M542 Cervicalgia: Secondary | ICD-10-CM | POA: Diagnosis not present

## 2012-05-22 DIAGNOSIS — M5020 Other cervical disc displacement, unspecified cervical region: Secondary | ICD-10-CM | POA: Diagnosis not present

## 2012-05-24 ENCOUNTER — Other Ambulatory Visit: Payer: Self-pay | Admitting: Internal Medicine

## 2012-05-24 DIAGNOSIS — R29898 Other symptoms and signs involving the musculoskeletal system: Secondary | ICD-10-CM

## 2012-05-30 DIAGNOSIS — M5000 Cervical disc disorder with myelopathy, unspecified cervical region: Secondary | ICD-10-CM | POA: Diagnosis not present

## 2012-07-19 ENCOUNTER — Other Ambulatory Visit: Payer: Self-pay | Admitting: *Deleted

## 2012-07-19 DIAGNOSIS — I10 Essential (primary) hypertension: Secondary | ICD-10-CM

## 2012-07-19 MED ORDER — CARVEDILOL 25 MG PO TABS: 25.0000 mg | ORAL_TABLET | Freq: Two times a day (BID) | ORAL | Status: DC

## 2012-08-02 ENCOUNTER — Other Ambulatory Visit: Payer: Self-pay | Admitting: *Deleted

## 2012-08-02 MED ORDER — METFORMIN HCL 500 MG PO TABS: 500.0000 mg | ORAL_TABLET | Freq: Two times a day (BID) | ORAL | Status: DC

## 2012-08-02 MED ORDER — SERTRALINE HCL 100 MG PO TABS: 100.0000 mg | ORAL_TABLET | Freq: Every day | ORAL | Status: DC

## 2012-08-02 MED ORDER — DILTIAZEM HCL ER COATED BEADS 420 MG PO TB24: 420.0000 mg | ORAL_TABLET | Freq: Every day | ORAL | Status: DC

## 2012-09-14 ENCOUNTER — Other Ambulatory Visit: Payer: Self-pay | Admitting: *Deleted

## 2012-09-14 DIAGNOSIS — I10 Essential (primary) hypertension: Secondary | ICD-10-CM

## 2012-09-14 MED ORDER — OLMESARTAN MEDOXOMIL 20 MG PO TABS: 20.0000 mg | ORAL_TABLET | Freq: Every day | ORAL | Status: DC

## 2012-09-14 NOTE — Telephone Encounter (Signed)
Fax from [facility] - requesting 90 day supply.  Thanks

## 2012-11-08 ENCOUNTER — Encounter: Payer: Self-pay | Admitting: Internal Medicine

## 2012-12-18 ENCOUNTER — Other Ambulatory Visit: Payer: Self-pay | Admitting: Internal Medicine

## 2013-02-13 ENCOUNTER — Other Ambulatory Visit: Payer: Self-pay | Admitting: *Deleted

## 2013-02-13 DIAGNOSIS — E785 Hyperlipidemia, unspecified: Secondary | ICD-10-CM

## 2013-02-13 MED ORDER — PRAVASTATIN SODIUM 20 MG PO TABS: ORAL_TABLET | ORAL | Status: DC

## 2013-02-13 NOTE — Telephone Encounter (Signed)
Please send #90 day Rx

## 2013-03-02 ENCOUNTER — Emergency Department (HOSPITAL_COMMUNITY)
Admission: EM | Admit: 2013-03-02 | Discharge: 2013-03-03 | Disposition: A | Discharge status: Home / Self Care | Payer: Medicare Other | Attending: Emergency Medicine | Admitting: Emergency Medicine

## 2013-03-02 ENCOUNTER — Encounter (HOSPITAL_COMMUNITY): Payer: Self-pay | Admitting: *Deleted

## 2013-03-02 ENCOUNTER — Emergency Department (HOSPITAL_COMMUNITY): Payer: Medicare Other

## 2013-03-02 DIAGNOSIS — F17200 Nicotine dependence, unspecified, uncomplicated: Secondary | ICD-10-CM | POA: Insufficient documentation

## 2013-03-02 DIAGNOSIS — M25562 Pain in left knee: Secondary | ICD-10-CM

## 2013-03-02 DIAGNOSIS — S8990XA Unspecified injury of unspecified lower leg, initial encounter: Secondary | ICD-10-CM | POA: Diagnosis not present

## 2013-03-02 DIAGNOSIS — Z7982 Long term (current) use of aspirin: Secondary | ICD-10-CM | POA: Insufficient documentation

## 2013-03-02 DIAGNOSIS — Z8739 Personal history of other diseases of the musculoskeletal system and connective tissue: Secondary | ICD-10-CM | POA: Insufficient documentation

## 2013-03-02 DIAGNOSIS — I10 Essential (primary) hypertension: Secondary | ICD-10-CM | POA: Insufficient documentation

## 2013-03-02 DIAGNOSIS — Z8679 Personal history of other diseases of the circulatory system: Secondary | ICD-10-CM | POA: Insufficient documentation

## 2013-03-02 DIAGNOSIS — Z880 Allergy status to penicillin: Secondary | ICD-10-CM | POA: Insufficient documentation

## 2013-03-02 DIAGNOSIS — F329 Major depressive disorder, single episode, unspecified: Secondary | ICD-10-CM | POA: Insufficient documentation

## 2013-03-02 DIAGNOSIS — M25569 Pain in unspecified knee: Secondary | ICD-10-CM | POA: Diagnosis not present

## 2013-03-02 DIAGNOSIS — F3289 Other specified depressive episodes: Secondary | ICD-10-CM | POA: Insufficient documentation

## 2013-03-02 DIAGNOSIS — Z8673 Personal history of transient ischemic attack (TIA), and cerebral infarction without residual deficits: Secondary | ICD-10-CM | POA: Insufficient documentation

## 2013-03-02 DIAGNOSIS — M7989 Other specified soft tissue disorders: Secondary | ICD-10-CM | POA: Diagnosis not present

## 2013-03-02 DIAGNOSIS — F1010 Alcohol abuse, uncomplicated: Secondary | ICD-10-CM | POA: Insufficient documentation

## 2013-03-02 DIAGNOSIS — R002 Palpitations: Secondary | ICD-10-CM | POA: Insufficient documentation

## 2013-03-02 DIAGNOSIS — E119 Type 2 diabetes mellitus without complications: Secondary | ICD-10-CM | POA: Insufficient documentation

## 2013-03-02 DIAGNOSIS — Z79899 Other long term (current) drug therapy: Secondary | ICD-10-CM | POA: Insufficient documentation

## 2013-03-02 DIAGNOSIS — I498 Other specified cardiac arrhythmias: Secondary | ICD-10-CM | POA: Diagnosis not present

## 2013-03-02 DIAGNOSIS — R0602 Shortness of breath: Secondary | ICD-10-CM | POA: Insufficient documentation

## 2013-03-02 LAB — URINALYSIS, ROUTINE W REFLEX MICROSCOPIC
Bilirubin Urine: NEGATIVE
Glucose, UA: NEGATIVE mg/dL
Hgb urine dipstick: NEGATIVE
Ketones, ur: NEGATIVE mg/dL
Leukocytes, UA: NEGATIVE
Nitrite: NEGATIVE
Protein, ur: NEGATIVE mg/dL
Specific Gravity, Urine: 1.011 (ref 1.005–1.030)
Urobilinogen, UA: 0.2 mg/dL (ref 0.0–1.0)
pH: 5 (ref 5.0–8.0)

## 2013-03-02 LAB — CBC
HCT: 40.6 % (ref 36.0–46.0)
Hemoglobin: 14.8 g/dL (ref 12.0–15.0)
MCH: 31.7 pg (ref 26.0–34.0)
MCHC: 36.5 g/dL — ABNORMAL HIGH (ref 30.0–36.0)
MCV: 86.9 fL (ref 78.0–100.0)
Platelets: 160 10*3/uL (ref 150–400)
RBC: 4.67 MIL/uL (ref 3.87–5.11)
RDW: 14.5 % (ref 11.5–15.5)
WBC: 8.4 10*3/uL (ref 4.0–10.5)

## 2013-03-02 MED ORDER — ACETAMINOPHEN 325 MG PO TABS
650.0000 mg | ORAL_TABLET | Freq: Once | ORAL | Status: AC
  Administered 2013-03-03: 650 mg via ORAL
  Filled 2013-03-02: qty 2

## 2013-03-02 NOTE — ED Notes (Addendum)
Lt. Knee swelling; heart is racing, sob. Drink 1/2 pint of liquor and 3 beers today

## 2013-03-02 NOTE — ED Provider Notes (Addendum)
History     CSN: [id number]  Arrival date & time 03/02/13  2141   First MD Initiated Contact with Patient 03/02/13 2222      Chief Complaint  Patient presents with  . Knee Pain  . Alcohol Intoxication  . Shortness of Breath  . Palpitations   Level V caveat patient intoxicated (Consider location/radiation/quality/duration/timing/severity/associated sxs/prior treatment) Patient is a 67 y.o. female presenting with knee pain, intoxication, shortness of breath, and palpitations.  Knee Pain Alcohol Intoxication Associated symptoms include shortness of breath.  Shortness of Breath Palpitations  Associated symptoms include shortness of breath.   Complains of left knee pain for one month, unchanged. Pain is worse with walking improved with remaining still. Pain is at the medial aspect of her left knee. Nonradiating no treatment prior to coming here he has not been evaluated prior to today for this complaint. She denies trauma. No fever no other associated symptoms she denies shortness of breath and denies palpitations she denies chest pain. She does admit to drinking 3 beers and one shot prior to coming here brought by EMS Past Medical History  Diagnosis Date  . Hypertension   . Diabetes mellitus     NIDDM  . Obesities, morbid   . TIA (transient ischemic attack)   . Depression   . Degenerative joint disease   . ETOH abuse   . Cardiomyopathy     Past Surgical History  Procedure Laterality Date  . Orif ankle fracture      right  . Cesarean section      times 2  . Eye surgery      laser - right eye 2010  . Left wrist      surgical repair    Family History  Problem Relation Age of Onset  . Heart disease Mother   . Cancer Father   . Diabetes Sister   . Kidney disease Brother   . Stroke Brother     History  Substance Use Topics  . Smoking status: Current Every Day Smoker -- 0.50 packs/day    Types: Cigarettes  . Smokeless tobacco: Not on file     Comment: used to  smoke 1/2 pk aweek; quit 6/11.  Marland Kitchen Alcohol Use: 1.8 oz/week    3 Cans of beer per week     Comment: intoxicated at this time    OB History   Grav Para Term Preterm Abortions TAB SAB Ect Mult Living                  Review of Systems  Unable to perform ROS: Other  Respiratory: Positive for shortness of breath.   Cardiovascular: Positive for palpitations.  Musculoskeletal: Positive for arthralgias.       Left knee pain    Allergies  Ace inhibitors and Penicillins  Home Medications   Current Outpatient Rx  Name  Route  Sig  Dispense  Refill  . aspirin EC 81 MG tablet   Oral   Take 81 mg by mouth daily.         . carvedilol (COREG) 25 MG tablet   Oral   Take 1 tablet (25 mg total) by mouth 2 (two) times daily with a meal.   60 tablet   3   . diltiazem (CARDIZEM LA) 420 MG 24 hr tablet   Oral   Take 1 tablet (420 mg total) by mouth daily.   90 tablet   1   . glipiZIDE (GLUCOTROL) 5 MG tablet  Oral   Take 1 tablet (5 mg total) by mouth daily.   90 tablet   3   . metFORMIN (GLUCOPHAGE) 500 MG tablet   Oral   Take 1 tablet (500 mg total) by mouth 2 (two) times daily.   180 tablet   1   . Multiple Vitamin (MULITIVITAMIN WITH MINERALS) TABS   Oral   Take 1 tablet by mouth daily.         Marland Kitchen olmesartan (BENICAR) 20 MG tablet   Oral   Take 1 tablet (20 mg total) by mouth daily.   90 tablet   3   . pravastatin (PRAVACHOL) 20 MG tablet   Oral   Take 20 mg by mouth at bedtime.         . sertraline (ZOLOFT) 100 MG tablet   Oral   Take 1 tablet (100 mg total) by mouth daily. Take 1 tablet daily by mouth   90 tablet   1     BP 125/64  Pulse 71  Temp(Src) 98.8 F (37.1 C) (Oral)  Resp 23  SpO2 97%  Physical Exam  Nursing note and vitals reviewed. Constitutional: She appears well-developed and well-nourished. No distress.  Strong odor of alcohol on breath  HENT:  Head: Normocephalic and atraumatic.  Eyes: Conjunctivae are normal. Pupils are  equal, round, and reactive to light.  Neck: Neck supple. No tracheal deviation present. No thyromegaly present.  Cardiovascular: Normal rate and regular rhythm.   No murmur heard. Pulmonary/Chest: Effort normal and breath sounds normal.  Abdominal: Soft. Bowel sounds are normal. She exhibits no distension. There is no tenderness.  Morbidly obese  Musculoskeletal: Normal range of motion. She exhibits no edema and no tenderness.  All 4 extremities without redness swelling or tenderness  Neurological: She is alert. Coordination normal.  Walks with a limp favoring the left leg  Skin: Skin is warm and dry. No rash noted.  Psychiatric: She has a normal mood and affect.    ED Course  Procedures (including critical care time)  Labs Reviewed  CBC  BASIC METABOLIC PANEL  PRO B NATRIURETIC PEPTIDE  ETHANOL  URINALYSIS, ROUTINE W REFLEX MICROSCOPIC   Dg Knee Complete 4 Views Left  03/02/2013  *RADIOLOGY REPORT*  Clinical Data: Left knee pain and swelling.  No known injury.  LEFT KNEE - COMPLETE 4+ VIEW  Comparison: None.  Findings: Moderate sized anterior patellar spur at the site of quadriceps tendon insertion.  No fracture, dislocation or effusion.  IMPRESSION: No acute abnormality.   Original Report Authenticated By: Beckie Salts, M.D.      No diagnosis found.   Date: 03/02/2013  Rate: 70  Rhythm: normal sinus rhythm  QRS Axis: left  Intervals: normal  ST/T Wave abnormalities: nonspecific T wave changes  Conduction Disutrbances:none  Narrative Interpretation:   Old EKG Reviewed: Tracing from 11/20/2011 showed sinus tachycardia 110 beats per minute diffuse T-wave flattening seen on today's tracing not present in prior tracing interpreted by me Knee x-ray viewed by me Results for orders placed during the hospital encounter of 03/02/13  CBC      Result Value Range   WBC 8.4  4.0 - 10.5 K/uL   RBC 4.67  3.87 - 5.11 MIL/uL   Hemoglobin 14.8  12.0 - 15.0 g/dL   HCT 16.1  09.6 - 04.5 %    MCV 86.9  78.0 - 100.0 fL   MCH 31.7  26.0 - 34.0 pg   MCHC 36.5 (*) 30.0 - 36.0  g/dL   RDW 40.9  81.1 - 91.4 %   Platelets 160  150 - 400 K/uL  BASIC METABOLIC PANEL      Result Value Range   Sodium 133 (*) 135 - 145 mEq/L   Potassium 2.9 (*) 3.5 - 5.1 mEq/L   Chloride 94 (*) 96 - 112 mEq/L   CO2 25  19 - 32 mEq/L   Glucose, Bld 85  70 - 99 mg/dL   BUN 10  6 - 23 mg/dL   Creatinine, Ser 7.82  0.50 - 1.10 mg/dL   Calcium 9.0  8.4 - 95.6 mg/dL   GFR calc non Af Amer >90  >90 mL/min   GFR calc Af Amer >90  >90 mL/min  PRO B NATRIURETIC PEPTIDE      Result Value Range   Pro B Natriuretic peptide (BNP) 100.5  0 - 125 pg/mL  ETHANOL      Result Value Range   Alcohol, Ethyl (B) 249 (*) 0 - 11 mg/dL  URINALYSIS, ROUTINE W REFLEX MICROSCOPIC      Result Value Range   Color, Urine YELLOW  YELLOW   APPearance CLEAR  CLEAR   Specific Gravity, Urine 1.011  1.005 - 1.030   pH 5.0  5.0 - 8.0   Glucose, UA NEGATIVE  NEGATIVE mg/dL   Hgb urine dipstick NEGATIVE  NEGATIVE   Bilirubin Urine NEGATIVE  NEGATIVE   Ketones, ur NEGATIVE  NEGATIVE mg/dL   Protein, ur NEGATIVE  NEGATIVE mg/dL   Urobilinogen, UA 0.2  0.0 - 1.0 mg/dL   Nitrite NEGATIVE  NEGATIVE   Leukocytes, UA NEGATIVE  NEGATIVE  POCT I-STAT TROPONIN I      Result Value Range   Troponin i, poc 0.00  0.00 - 0.08 ng/mL   Comment 3            Dg Knee Complete 4 Views Left  03/02/2013  *RADIOLOGY REPORT*  Clinical Data: Left knee pain and swelling.  No known injury.  LEFT KNEE - COMPLETE 4+ VIEW  Comparison: None.  Findings: Moderate sized anterior patellar spur at the site of quadriceps tendon insertion.  No fracture, dislocation or effusion.  IMPRESSION: No acute abnormality.   Original Report Authenticated By: Beckie Salts, M.D.     6:40 PM patient awake alert does not appear intoxicated. Ambulates with limp favoring left lower extremity. Patient given potassium supplement prior to discharge MDM  Plan Tylenol for pain patient  advised to seek help with her drinking. Orthopedic referral Dr.Dumonski. Serum potassium recheck with PMD Diagnosis #1 left knee pain #2 alcohol intoxication #3 hypokalemia        Doug Sou, MD 03/03/13 2130  Doug Sou, MD 03/03/13 [id number]

## 2013-03-02 NOTE — ED Notes (Signed)
ARRIVED WITH EMS FROM HOME REPORTS RIGHT KNEE PAIN THIS EVENING , DENIES INJURY OR FALL , INTOXICATED WITH ETOH .

## 2013-03-02 NOTE — ED Notes (Signed)
Knee is not painful to palpation. Pain seems to increase significantly upon ROM exercise and weight bearing movement.

## 2013-03-03 LAB — POCT I-STAT TROPONIN I: Troponin i, poc: 0 ng/mL (ref 0.00–0.08)

## 2013-03-03 LAB — ETHANOL: Alcohol, Ethyl (B): 249 mg/dL — ABNORMAL HIGH (ref 0–11)

## 2013-03-03 LAB — BASIC METABOLIC PANEL
BUN: 10 mg/dL (ref 6–23)
CO2: 25 mEq/L (ref 19–32)
Calcium: 9 mg/dL (ref 8.4–10.5)
Chloride: 94 mEq/L — ABNORMAL LOW (ref 96–112)
Creatinine, Ser: 0.53 mg/dL (ref 0.50–1.10)
GFR calc Af Amer: >90 mL/min (ref >90)
GFR calc non Af Amer: >90 mL/min (ref >90)
Glucose, Bld: 85 mg/dL (ref 70–99)
Potassium: 2.9 mEq/L — ABNORMAL LOW (ref 3.5–5.1)
Sodium: 133 mEq/L — ABNORMAL LOW (ref 135–145)

## 2013-03-03 LAB — PRO B NATRIURETIC PEPTIDE: Pro B Natriuretic peptide (BNP): 100.5 pg/mL (ref 0–125)

## 2013-03-03 MED ORDER — POTASSIUM CHLORIDE CRYS ER 20 MEQ PO TBCR
40.0000 meq | EXTENDED_RELEASE_TABLET | Freq: Once | ORAL | Status: AC
  Administered 2013-03-03: 40 meq via ORAL
  Filled 2013-03-03: qty 2

## 2013-03-03 NOTE — ED Notes (Signed)
PT ASSISTED TO BATHROOM WITH NURSE. PT CURRENTLY IN BATHROOM AND INSTRUCTED TO HIT CALL BELL ONCE FINISHED. ONCE PT HIT CALL BELL, PT ASSISTED BACK TO BED VIA WHEELCHAIR AND BACK INTO BED WITHOUT INCIDENT.

## 2013-03-06 ENCOUNTER — Other Ambulatory Visit: Payer: Self-pay | Admitting: Internal Medicine

## 2013-03-06 DIAGNOSIS — I471 Supraventricular tachycardia: Secondary | ICD-10-CM

## 2013-03-08 IMAGING — CT CT ABD-PELV W/O CM
2 of 4 series · 17 of 46 positions shown, 19 images · non-contrast
Comparison: CT abdomen and pelvis 04/09/2009.

CLINICAL DATA: Possible fall.

CT ABDOMEN AND PELVIS WITHOUT CONTRAST
TECHNIQUE: Multidetector CT imaging of the abdomen and pelvis was
performed following the standard protocol without intravenous
contrast.

[Series 3: routine · axial · 0.94mm/px · z∈[+53,+478]mm · 14 of 93 slices shown, 16 images]
[im 4/93  soft-tissue]
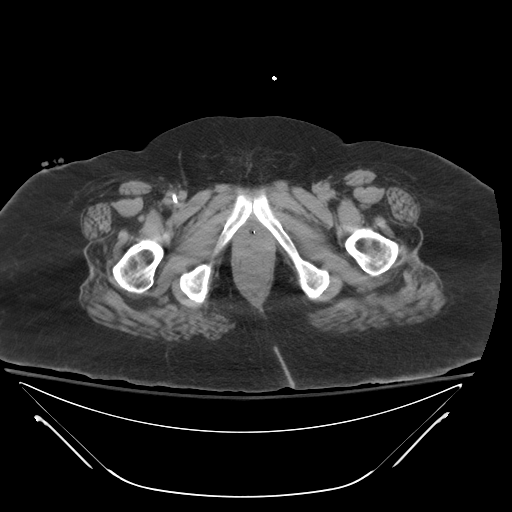
[im 4/93  bone]
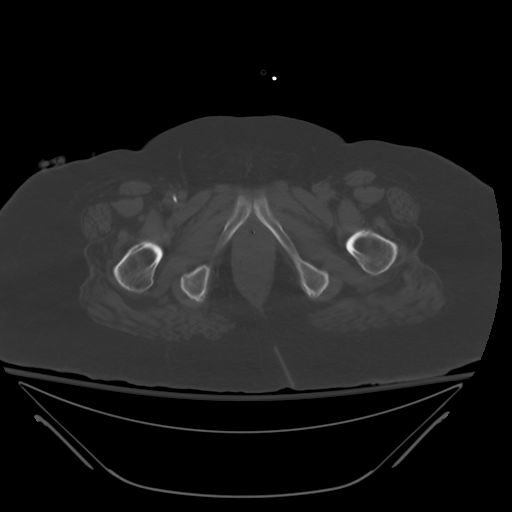
[im 12/93  soft-tissue]
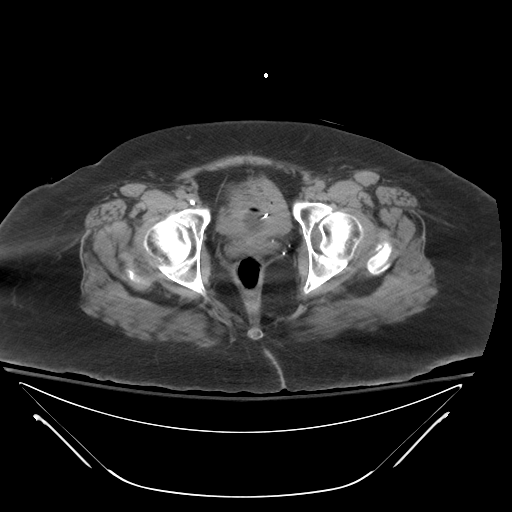
[im 20/93  soft-tissue]
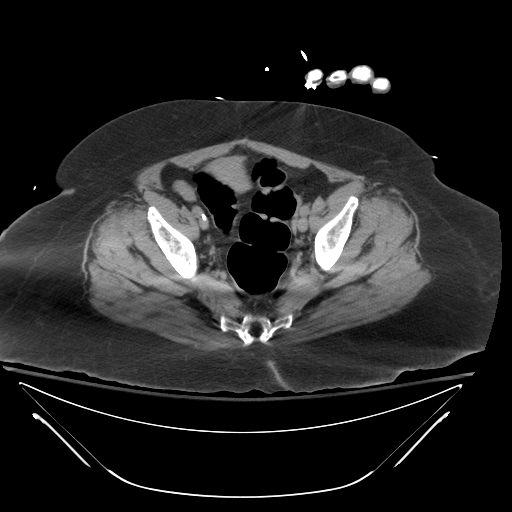
[im 24/93  soft-tissue]
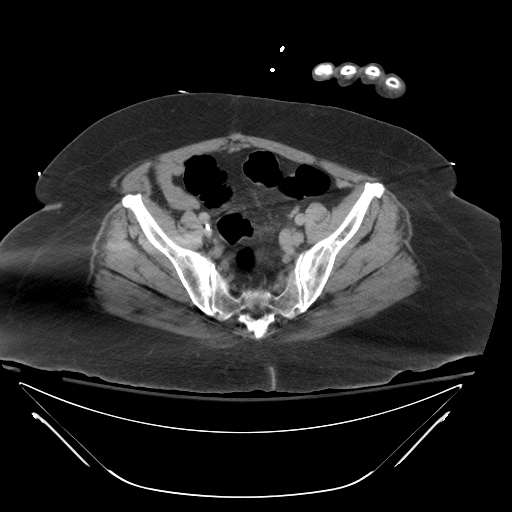
[im 31/93  soft-tissue]
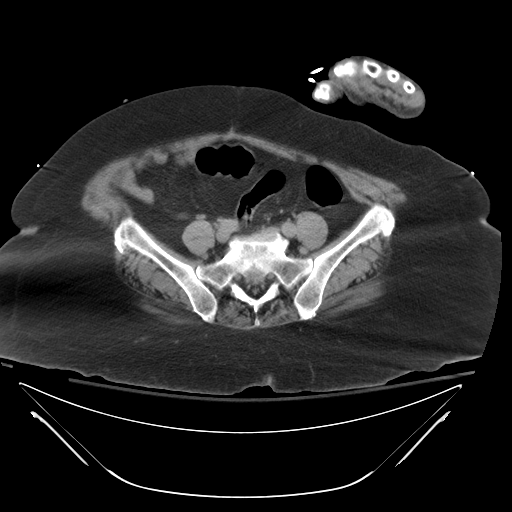
[im 39/93  soft-tissue]
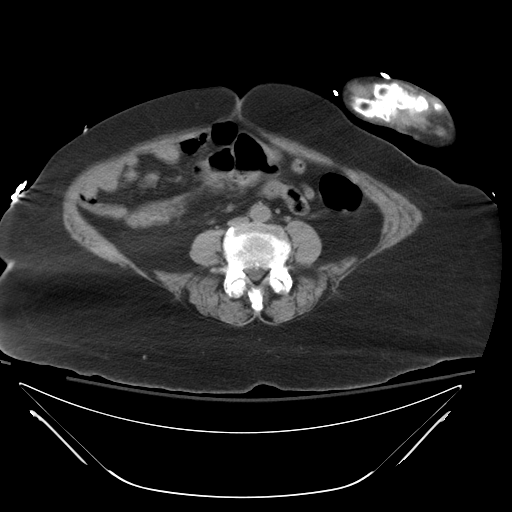
[im 43/93  soft-tissue]
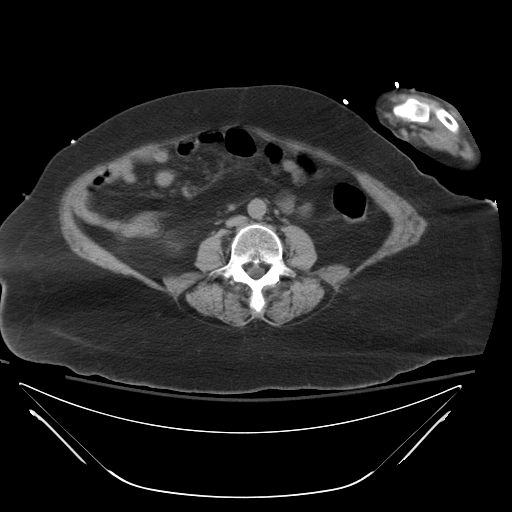
[im 50/93  soft-tissue]
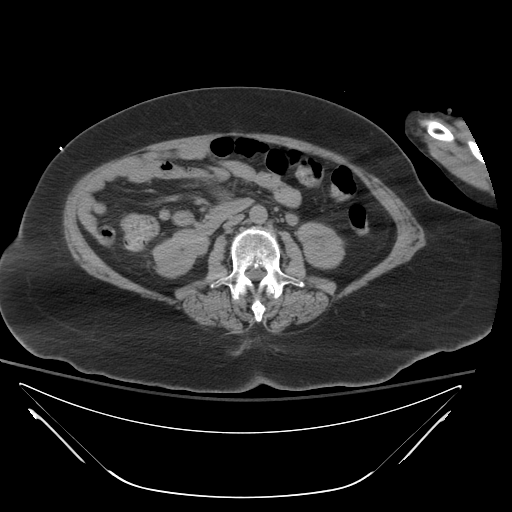
[im 54/93  soft-tissue]
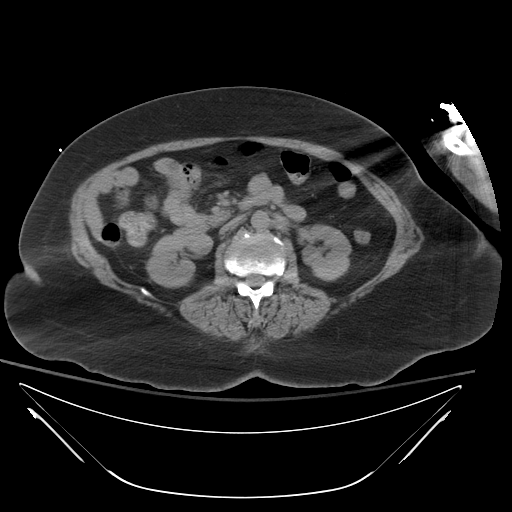
[im 54/93  bone]
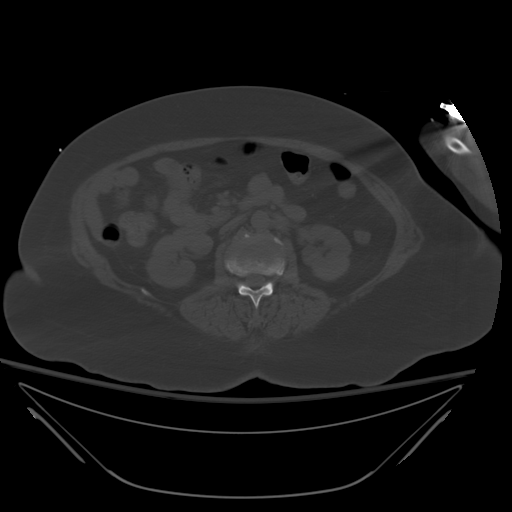
[im 62/93  soft-tissue]
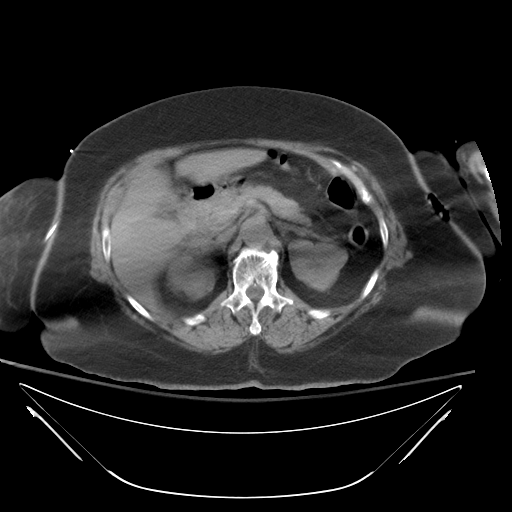
[im 70/93  soft-tissue]
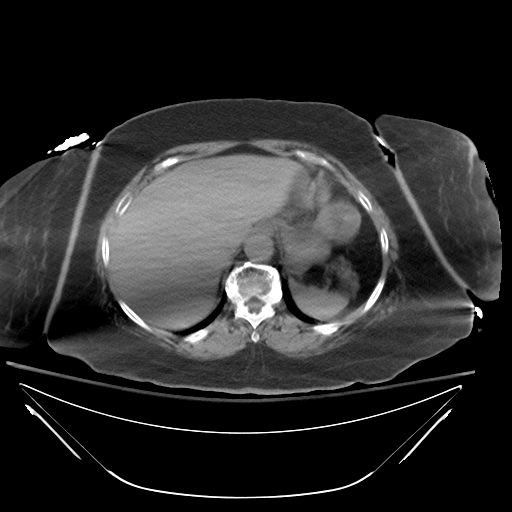
[im 73/93  soft-tissue]
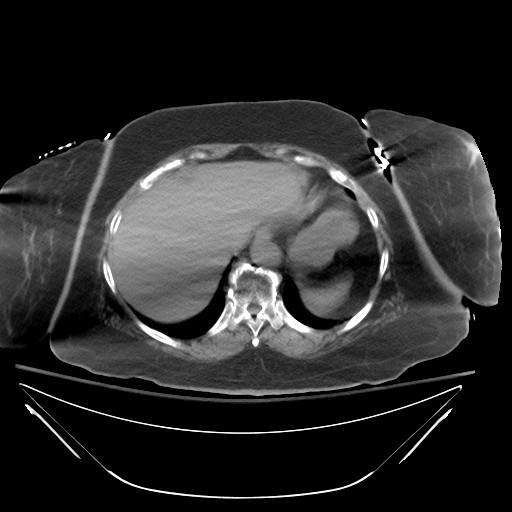
[im 81/93  soft-tissue]
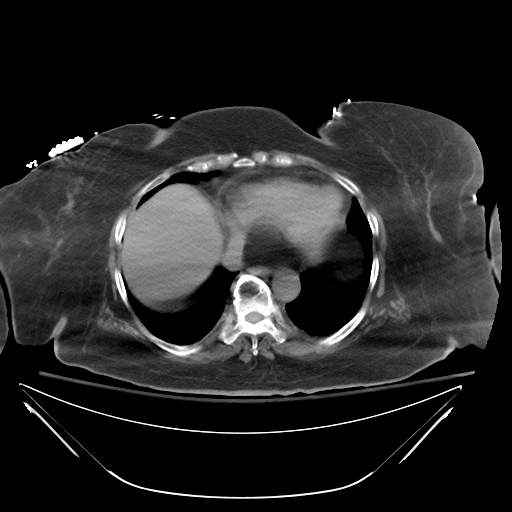
[im 89/93  soft-tissue]
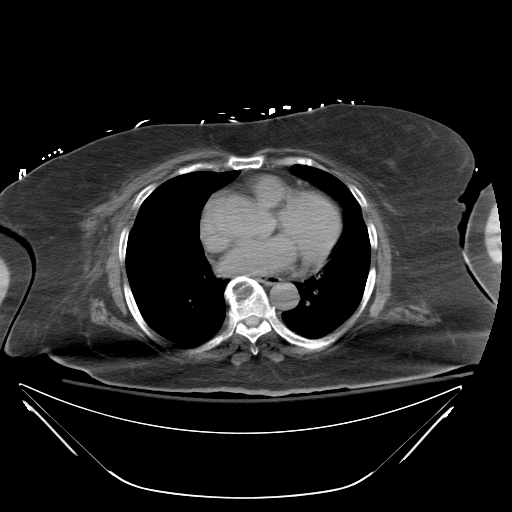

[coronal · coronal · 0.94mm/px · 3 of 65 slices shown]
[im 22/65  soft-tissue]
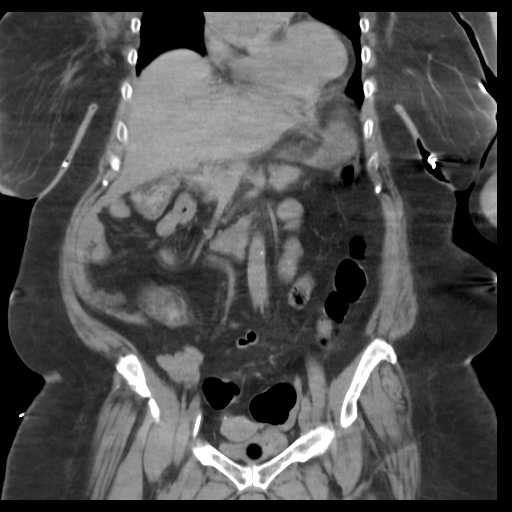
[im 29/65  soft-tissue]
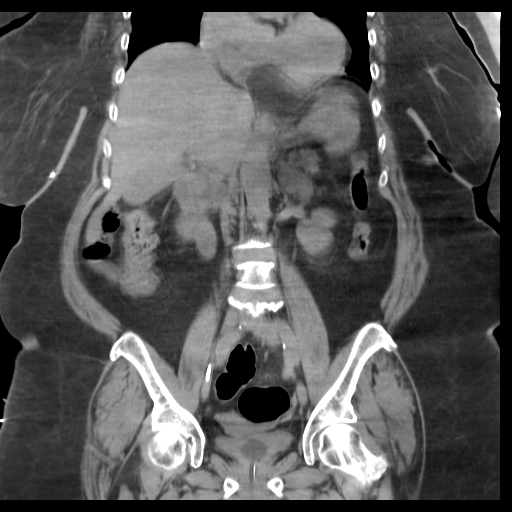
[im 36/65  soft-tissue]
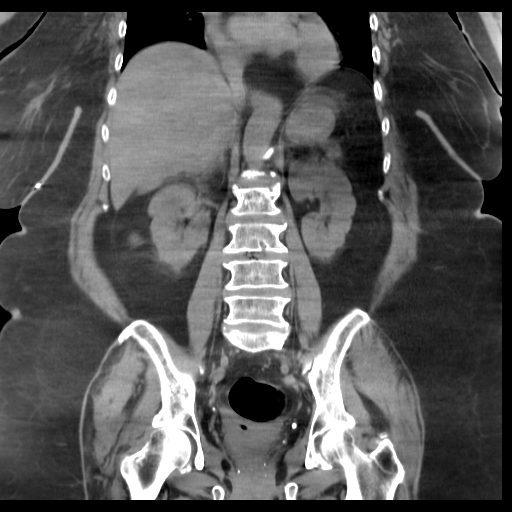

[17 of 46 positions shown; findings below may reference images not displayed]

FINDINGS: There is some atelectatic change in the lung bases.  No
pleural or pericardial effusion.  Epicardial lipoma posteriorly
along the inferior aspect of the left atrium is unchanged.

The patient is status post cholecystectomy.  The liver, spleen,
adrenal glands, kidneys and pancreas are unremarkable.  No
lymphadenopathy or fluid is identified.  Uterus and adnexa are
unremarkable.  Foley catheter is in place.  The stomach and small
and large bowel are unremarkable.  No fracture or other focal bony
abnormality is identified.
IMPRESSION: No acute finding.

## 2013-03-08 IMAGING — CR DG PORTABLE PELVIS
1 series · 1 of 1 positions shown · non-contrast
Comparison: Pelvic radiographs 09/29/2011.

CLINICAL DATA: Fall today.  Hip pain - unable to specify laterality
due to confusion.

PORTABLE PELVIS

[AP]
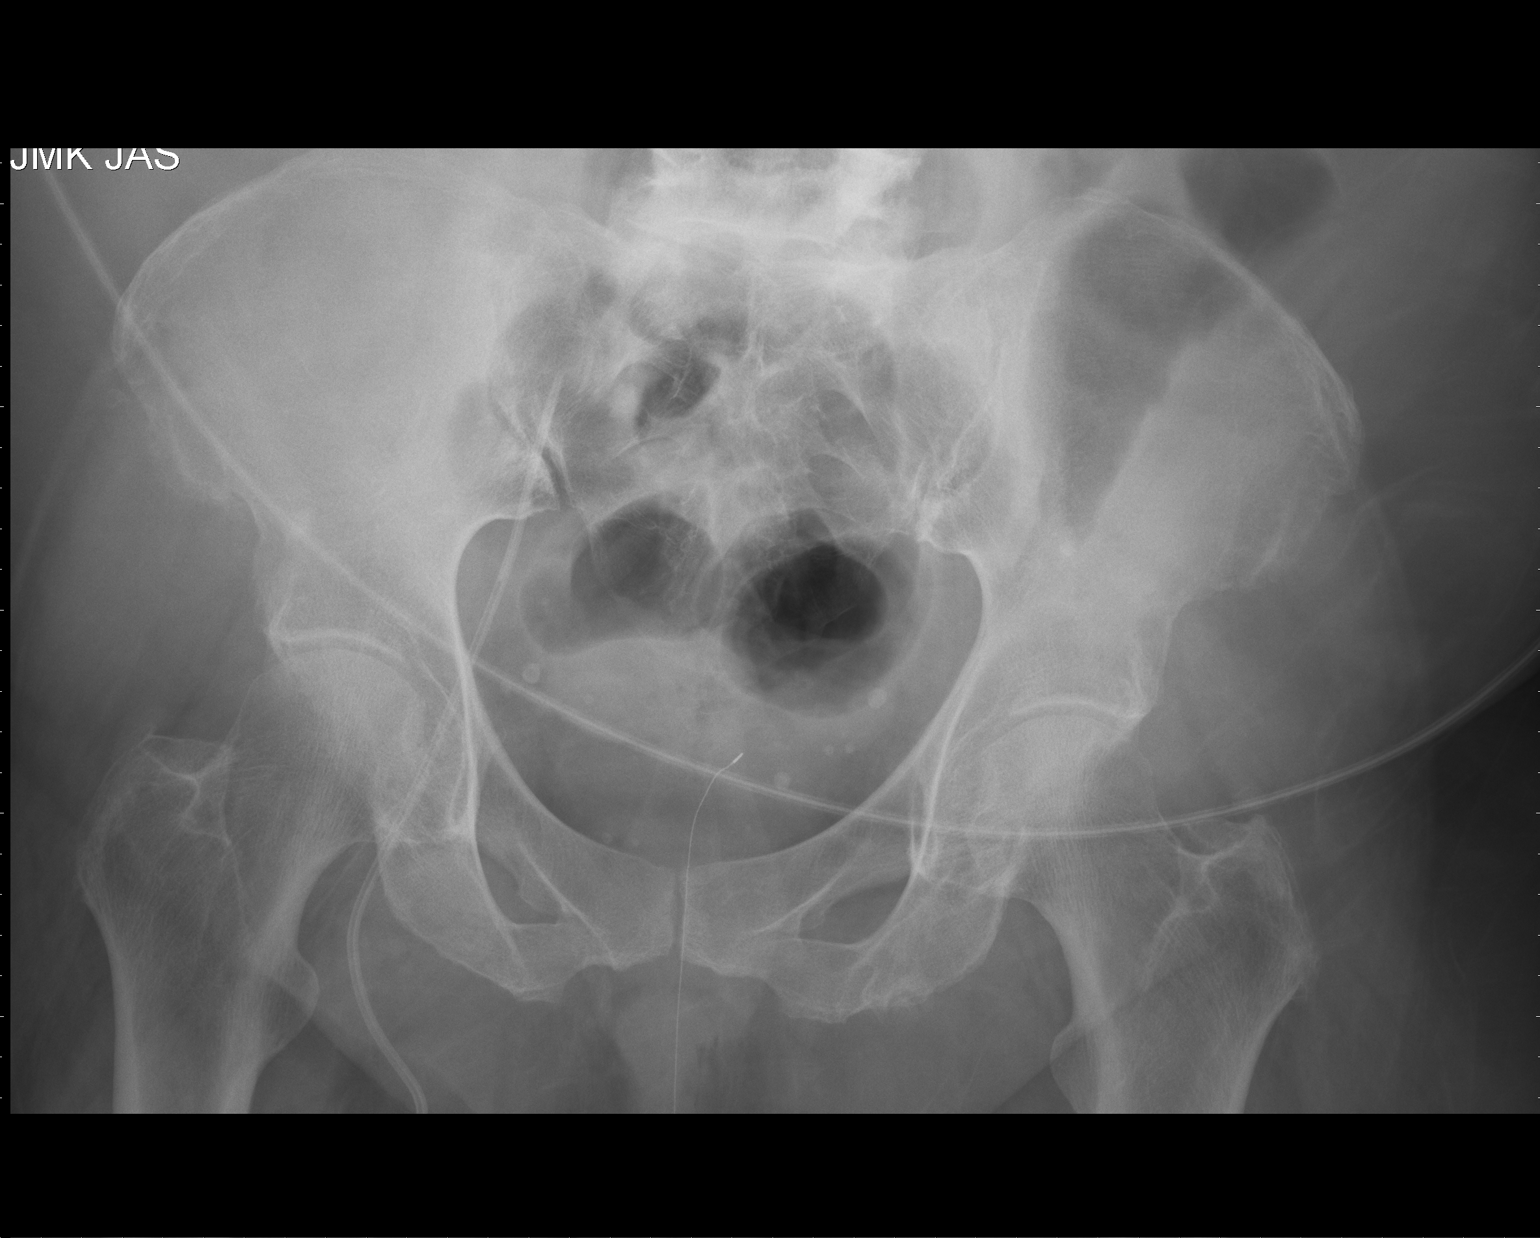

[1 of 1 positions shown; findings below may reference images not displayed]

FINDINGS: 0302 hours.  There is mild osteopenia.  There are stable
mild degenerative changes of the hips and sacroiliac joints.  No
displaced fractures are identified.  A right femoral line and
rectal probe are noted.
IMPRESSION: No acute osseous findings identified.  Dedicated hip radiographs
should be considered for follow up if the patient remains
symptomatic or is unable to bear weight.

## 2013-03-09 IMAGING — CR DG CHEST 1V PORT
1 series · 1 of 1 positions shown · non-contrast
Comparison: 11/20/2011.

CLINICAL DATA: Shortness of breath..

PORTABLE CHEST - 1 VIEW

[AP]
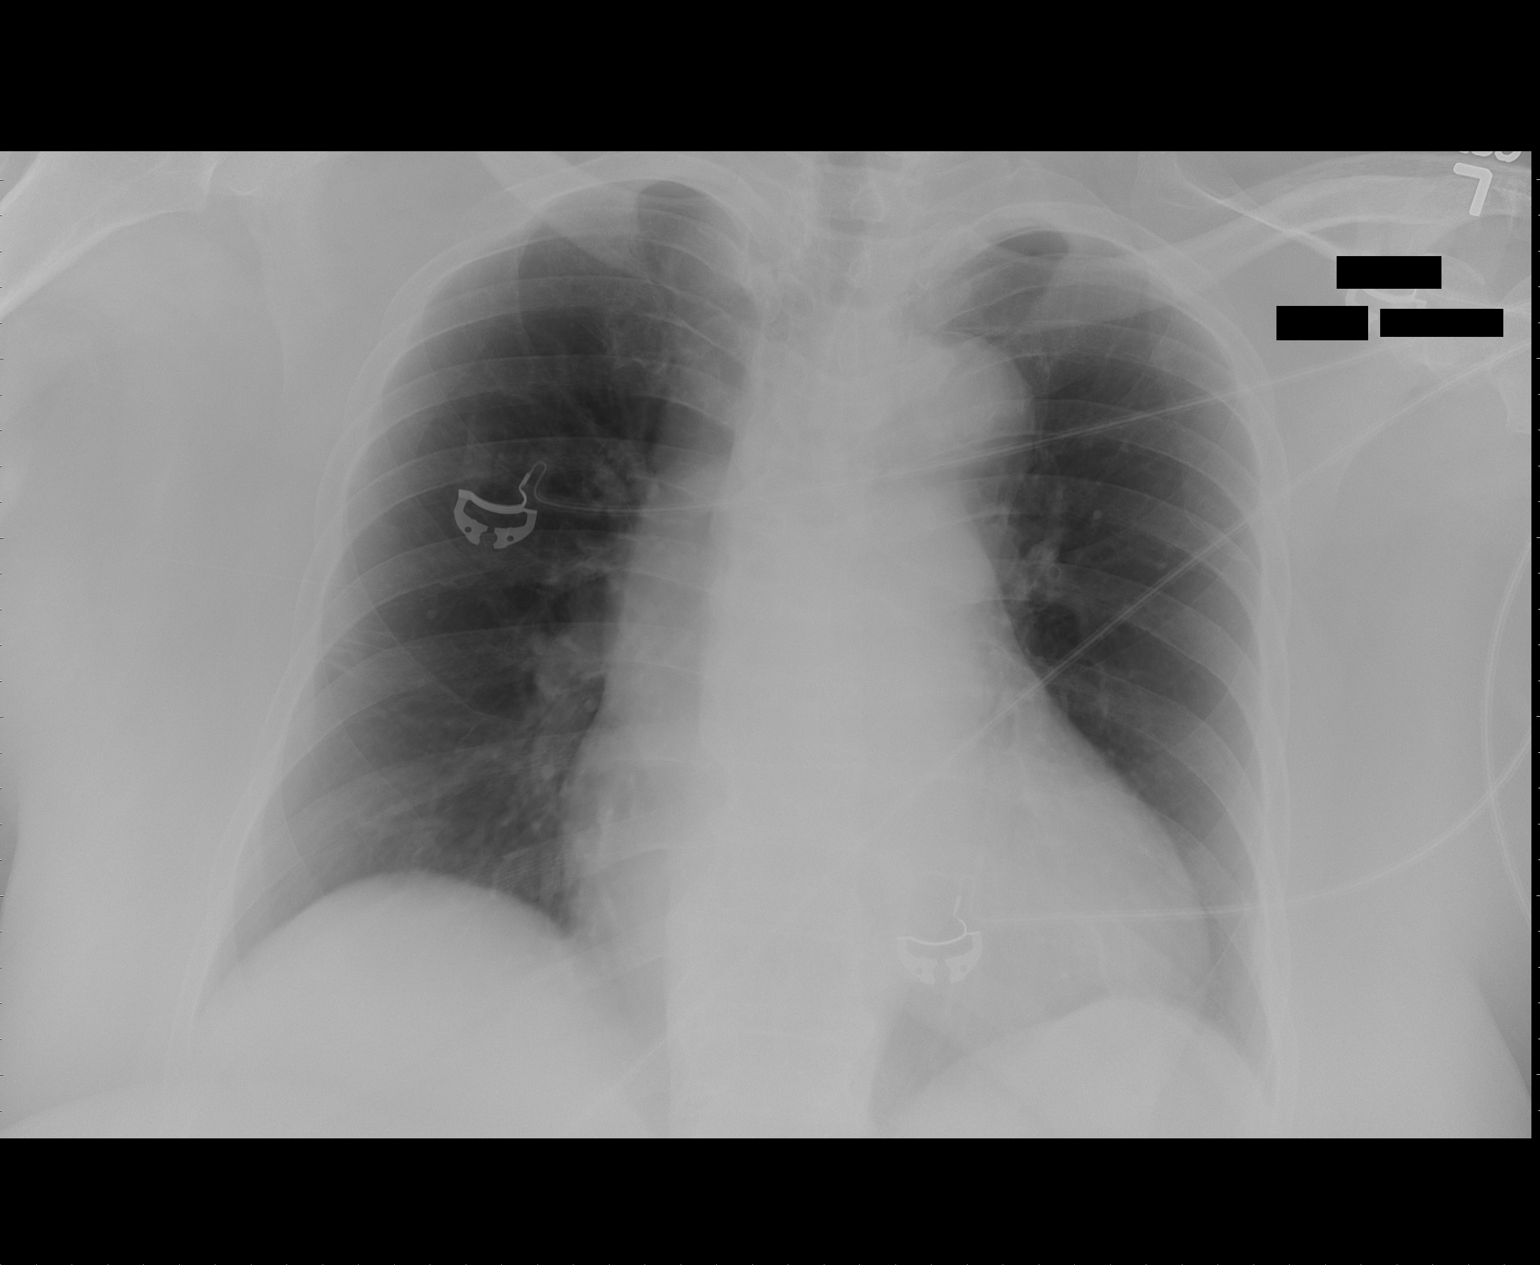

[1 of 1 positions shown; findings below may reference images not displayed]

FINDINGS: Cardiomegaly.  Tortuous aorta. No infiltrate, congestive
heart failure or pneumothorax..  Central pulmonary vascular
prominence.  Linear atelectasis peripheral aspect right midlung.
IMPRESSION: Cardiomegaly.

Tortuous aorta.

## 2013-04-10 ENCOUNTER — Encounter: Payer: Self-pay | Admitting: Dietician

## 2013-04-22 ENCOUNTER — Other Ambulatory Visit: Payer: Self-pay | Admitting: Internal Medicine

## 2013-04-22 DIAGNOSIS — K219 Gastro-esophageal reflux disease without esophagitis: Secondary | ICD-10-CM

## 2013-04-22 DIAGNOSIS — F32A Depression, unspecified: Secondary | ICD-10-CM

## 2013-04-22 DIAGNOSIS — E119 Type 2 diabetes mellitus without complications: Secondary | ICD-10-CM

## 2013-04-22 DIAGNOSIS — F329 Major depressive disorder, single episode, unspecified: Secondary | ICD-10-CM

## 2013-05-02 ENCOUNTER — Emergency Department (HOSPITAL_COMMUNITY)
Admission: EM | Admit: 2013-05-02 | Discharge: 2013-05-04 | Disposition: A | Discharge status: Home / Self Care | Payer: Medicare Other | Attending: Emergency Medicine | Admitting: Emergency Medicine

## 2013-05-02 DIAGNOSIS — F17200 Nicotine dependence, unspecified, uncomplicated: Secondary | ICD-10-CM | POA: Insufficient documentation

## 2013-05-02 DIAGNOSIS — Z7982 Long term (current) use of aspirin: Secondary | ICD-10-CM | POA: Insufficient documentation

## 2013-05-02 DIAGNOSIS — I428 Other cardiomyopathies: Secondary | ICD-10-CM | POA: Insufficient documentation

## 2013-05-02 DIAGNOSIS — F329 Major depressive disorder, single episode, unspecified: Secondary | ICD-10-CM | POA: Insufficient documentation

## 2013-05-02 DIAGNOSIS — Z8673 Personal history of transient ischemic attack (TIA), and cerebral infarction without residual deficits: Secondary | ICD-10-CM | POA: Insufficient documentation

## 2013-05-02 DIAGNOSIS — Z888 Allergy status to other drugs, medicaments and biological substances status: Secondary | ICD-10-CM | POA: Insufficient documentation

## 2013-05-02 DIAGNOSIS — F3289 Other specified depressive episodes: Secondary | ICD-10-CM | POA: Insufficient documentation

## 2013-05-02 DIAGNOSIS — Z79899 Other long term (current) drug therapy: Secondary | ICD-10-CM | POA: Insufficient documentation

## 2013-05-02 DIAGNOSIS — I10 Essential (primary) hypertension: Secondary | ICD-10-CM | POA: Insufficient documentation

## 2013-05-02 DIAGNOSIS — F1010 Alcohol abuse, uncomplicated: Secondary | ICD-10-CM

## 2013-05-02 DIAGNOSIS — H5000 Unspecified esotropia: Secondary | ICD-10-CM | POA: Insufficient documentation

## 2013-05-02 DIAGNOSIS — F10229 Alcohol dependence with intoxication, unspecified: Secondary | ICD-10-CM | POA: Insufficient documentation

## 2013-05-02 DIAGNOSIS — F411 Generalized anxiety disorder: Secondary | ICD-10-CM | POA: Insufficient documentation

## 2013-05-02 DIAGNOSIS — E119 Type 2 diabetes mellitus without complications: Secondary | ICD-10-CM | POA: Insufficient documentation

## 2013-05-02 DIAGNOSIS — Z880 Allergy status to penicillin: Secondary | ICD-10-CM | POA: Insufficient documentation

## 2013-05-02 DIAGNOSIS — M1990 Unspecified osteoarthritis, unspecified site: Secondary | ICD-10-CM | POA: Insufficient documentation

## 2013-05-02 HISTORY — DX: Anxiety disorder, unspecified: F41.9

## 2013-05-02 LAB — COMPREHENSIVE METABOLIC PANEL
ALT: 19 U/L (ref 0–35)
AST: 23 U/L (ref 0–37)
Albumin: 3.4 g/dL — ABNORMAL LOW (ref 3.5–5.2)
Alkaline Phosphatase: 47 U/L (ref 39–117)
BUN: 8 mg/dL (ref 6–23)
CO2: 32 mEq/L (ref 19–32)
Calcium: 8.3 mg/dL — ABNORMAL LOW (ref 8.4–10.5)
Chloride: 101 mEq/L (ref 96–112)
Creatinine, Ser: 0.51 mg/dL (ref 0.50–1.10)
GFR calc Af Amer: >90 mL/min (ref >90)
GFR calc non Af Amer: >90 mL/min (ref >90)
Glucose, Bld: 154 mg/dL — ABNORMAL HIGH (ref 70–99)
Potassium: 3.4 mEq/L — ABNORMAL LOW (ref 3.5–5.1)
Sodium: 143 mEq/L (ref 135–145)
Total Bilirubin: 0.1 mg/dL — ABNORMAL LOW (ref 0.3–1.2)
Total Protein: 6.8 g/dL (ref 6.0–8.3)

## 2013-05-02 LAB — CBC WITH DIFFERENTIAL/PLATELET
Basophils Absolute: 0 10*3/uL (ref 0.0–0.1)
Basophils Relative: 0 % (ref 0–1)
Eosinophils Absolute: 0.1 10*3/uL (ref 0.0–0.7)
Eosinophils Relative: 1 % (ref 0–5)
HCT: 40.2 % (ref 36.0–46.0)
Hemoglobin: 13.8 g/dL (ref 12.0–15.0)
Lymphocytes Relative: 64 % — ABNORMAL HIGH (ref 12–46)
Lymphs Abs: 4.9 10*3/uL — ABNORMAL HIGH (ref 0.7–4.0)
MCH: 30.9 pg (ref 26.0–34.0)
MCHC: 34.3 g/dL (ref 30.0–36.0)
MCV: 89.9 fL (ref 78.0–100.0)
Monocytes Absolute: 0.5 10*3/uL (ref 0.1–1.0)
Monocytes Relative: 7 % (ref 3–12)
Neutro Abs: 2.1 10*3/uL (ref 1.7–7.7)
Neutrophils Relative %: 28 % — ABNORMAL LOW (ref 43–77)
Platelets: 245 10*3/uL (ref 150–400)
RBC: 4.47 MIL/uL (ref 3.87–5.11)
RDW: 14.6 % (ref 11.5–15.5)
WBC: 7.7 10*3/uL (ref 4.0–10.5)

## 2013-05-02 LAB — RAPID URINE DRUG SCREEN, HOSP PERFORMED
Amphetamines: NOT DETECTED
Barbiturates: NOT DETECTED
Benzodiazepines: NOT DETECTED
Cocaine: NOT DETECTED
Opiates: NOT DETECTED
Tetrahydrocannabinol: NOT DETECTED

## 2013-05-02 LAB — SALICYLATE LEVEL: Salicylate Lvl: <2 mg/dL — ABNORMAL LOW (ref 2.8–20.0)

## 2013-05-02 LAB — ETHANOL: Alcohol, Ethyl (B): 367 mg/dL — ABNORMAL HIGH (ref 0–11)

## 2013-05-02 LAB — ACETAMINOPHEN LEVEL: Acetaminophen (Tylenol), Serum: <15 ug/mL (ref 10–30)

## 2013-05-02 MED ORDER — ZOLPIDEM TARTRATE 5 MG PO TABS: 5.0000 mg | ORAL_TABLET | Freq: Every evening | ORAL | Status: DC | PRN

## 2013-05-02 MED ORDER — ACETAMINOPHEN 325 MG PO TABS
650.0000 mg | ORAL_TABLET | ORAL | Status: DC | PRN
  Administered 2013-05-03: 650 mg via ORAL
  Filled 2013-05-02: qty 2

## 2013-05-02 MED ORDER — LORAZEPAM 1 MG PO TABS
1.0000 mg | ORAL_TABLET | Freq: Three times a day (TID) | ORAL | Status: DC | PRN
  Administered 2013-05-03 – 2013-05-04 (×2): 1 mg via ORAL
  Filled 2013-05-02 (×2): qty 1

## 2013-05-02 MED ORDER — IBUPROFEN 400 MG PO TABS
600.0000 mg | ORAL_TABLET | Freq: Three times a day (TID) | ORAL | Status: DC | PRN
  Administered 2013-05-03: 600 mg via ORAL
  Filled 2013-05-02: qty 3

## 2013-05-02 MED ORDER — NICOTINE 21 MG/24HR TD PT24
21.0000 mg | MEDICATED_PATCH | Freq: Every day | TRANSDERMAL | Status: DC | PRN
  Filled 2013-05-02: qty 1

## 2013-05-02 MED ORDER — ONDANSETRON HCL 4 MG PO TABS
4.0000 mg | ORAL_TABLET | Freq: Three times a day (TID) | ORAL | Status: DC | PRN
  Administered 2013-05-03: 4 mg via ORAL

## 2013-05-02 NOTE — ED Provider Notes (Signed)
History    CSN: [id number] Arrival date & time 05/02/13  2107  First MD Initiated Contact with Patient 05/02/13 2116     No chief complaint on file.  (Consider location/radiation/quality/duration/timing/severity/associated sxs/prior Treatment) The history is provided by the patient and the police.   Patient is a the ED for detox from EtOH. Patient was brought to the hospital via EMS after she called them claiming she wanted to hurt herself because she cannot stop drinking.  States she has been drinking liquor constantly for the past 5 days- approx 1 case of beer or 1 gallon of liquor, whichever she feels like.  Denies any illicit drug use.  Denies HI or AVH.  Patient appears heavily intoxicated on arrival, continually requesting help for her drinking. Denies any chest pain, shortness of breath, abdominal pain, nausea, vomiting, or diarrhea.  In NAD, no other complaints at this time.  Per EMS report, there were several empty vodka bottles and cases of beer in her home. They discussed with the neighbors who note this is not the first time that these events have occurred.  Past Medical History  Diagnosis Date  . Hypertension   . Diabetes mellitus     NIDDM  . Obesities, morbid   . TIA (transient ischemic attack)   . Depression   . Degenerative joint disease   . ETOH abuse   . Cardiomyopathy    Past Surgical History  Procedure Laterality Date  . Orif ankle fracture      right  . Cesarean section      times 2  . Eye surgery      laser - right eye 2010  . Left wrist      surgical repair   Family History  Problem Relation Age of Onset  . Heart disease Mother   . Cancer Father   . Diabetes Sister   . Kidney disease Brother   . Stroke Brother    History  Substance Use Topics  . Smoking status: Current Every Day Smoker -- 0.50 packs/day    Types: Cigarettes  . Smokeless tobacco: Not on file     Comment: used to smoke 1/2 pk aweek; quit 6/11.  Marland Kitchen Alcohol Use: 1.8 oz/week     3 Cans of beer per week     Comment: intoxicated at this time   OB History   Grav Para Term Preterm Abortions TAB SAB Ect Mult Living                 Review of Systems  Psychiatric/Behavioral:       Detox  All other systems reviewed and are negative.    Allergies  Ace inhibitors and Penicillins  Home Medications   Current Outpatient Rx  Name  Route  Sig  Dispense  Refill  . aspirin EC 81 MG tablet   Oral   Take 81 mg by mouth daily.         . carvedilol (COREG) 25 MG tablet   Oral   Take 1 tablet (25 mg total) by mouth 2 (two) times daily with a meal.   60 tablet   3   . glipiZIDE (GLUCOTROL) 5 MG tablet      TAKE ONE TABLET BY MOUTH EVERY DAY   90 tablet   3   . MATZIM LA 420 MG 24 hr tablet      TAKE ONE TABLET BY MOUTH EVERY DAY   90 tablet   3   . metFORMIN (GLUCOPHAGE)  500 MG tablet      TAKE ONE TABLET BY MOUTH TWICE DAILY   180 tablet   3   . Multiple Vitamin (MULITIVITAMIN WITH MINERALS) TABS   Oral   Take 1 tablet by mouth daily.         Marland Kitchen olmesartan (BENICAR) 20 MG tablet   Oral   Take 1 tablet (20 mg total) by mouth daily.   90 tablet   3   . omeprazole (PRILOSEC) 20 MG capsule      TAKE ONE CAPSULE BY MOUTH TWICE DAILY   30 capsule   3   . pravastatin (PRAVACHOL) 20 MG tablet   Oral   Take 20 mg by mouth at bedtime.         . sertraline (ZOLOFT) 100 MG tablet      TAKE ONE TABLET BY MOUTH EVERY DAY   90 tablet   3    BP 129/82  Pulse 111  SpO2 95%  Physical Exam  Nursing note and vitals reviewed. Constitutional: She is oriented to person, place, and time. No distress.  Morbidly obese  HENT:  Head: Normocephalic and atraumatic.  Mouth/Throat: Oropharynx is clear and moist.  Eyes: Conjunctivae and EOM are normal. Pupils are equal, round, and reactive to light.  Neck: Normal range of motion. Neck supple.  Cardiovascular: Normal rate, regular rhythm and normal heart sounds.   Pulmonary/Chest: Effort normal and  breath sounds normal. No respiratory distress. She has no wheezes.  Abdominal: Soft. Bowel sounds are normal. There is no tenderness. There is no guarding.  Musculoskeletal: Normal range of motion.  Neurological: She is alert and oriented to person, place, and time.  Skin: Skin is warm and dry.  Psychiatric: She has a normal mood and affect.  Heavily intoxicated, speech is erratic and very loud    ED Course  Procedures (including critical care time) Labs Reviewed  CBC WITH DIFFERENTIAL - Abnormal; Notable for the following:    Neutrophils Relative % 28 (*)    Lymphocytes Relative 64 (*)    Lymphs Abs 4.9 (*)    All other components within normal limits  COMPREHENSIVE METABOLIC PANEL - Abnormal; Notable for the following:    Potassium 3.4 (*)    Glucose, Bld 154 (*)    Calcium 8.3 (*)    Albumin 3.4 (*)    Total Bilirubin 0.1 (*)    All other components within normal limits  ETHANOL - Abnormal; Notable for the following:    Alcohol, Ethyl (B) 367 (*)    All other components within normal limits  SALICYLATE LEVEL - Abnormal; Notable for the following:    Salicylate Lvl <2.0 (*)    All other components within normal limits  URINE RAPID DRUG SCREEN (HOSP PERFORMED)  ACETAMINOPHEN LEVEL   No results found. No diagnosis found.  MDM   Pt heavily intoxicated-- ethanol 367.  Labs as above.  Pt initially asking for detox from EtOH but after about 2 hours pt began screaming and yelling that she would like to leave.  Given her current state and recent SI confession, i am uncomfortable with her leaving the ED. I have spoken with her brother who states that he would like her to be IVC'd until she is stabilized.  [employer] taking out IVC papers.  Pt medically cleared.  ACT consulted, they will see her and work on placement once sober.  Psych holding orders and home meds placed.  12:30 AM Pt re-evaluated.  Has been fed  and is now calm and resting comfortably and quietly in the bed.  Garlon Hatchet, PA-C 05/03/13 0053  Garlon Hatchet, PA-C 05/03/13 [phone]

## 2013-05-02 NOTE — ED Notes (Signed)
Patient presents to ED via [facility]. Pt called 911 today stating, "I am tired of drinking and I need some help with detox." Upon arrival to patient home by EMS, patient was A&O with alcohol present. Patient also stated to EMS that she is "tired and drinking herself to death." A&O x4 upon arrival to ED. VSS.

## 2013-05-03 LAB — GLUCOSE, CAPILLARY: Glucose-Capillary: 126 mg/dL — ABNORMAL HIGH (ref 70–99)

## 2013-05-03 MED ORDER — SERTRALINE HCL 50 MG PO TABS
100.0000 mg | ORAL_TABLET | Freq: Every day | ORAL | Status: DC
  Administered 2013-05-03 – 2013-05-04 (×2): 100 mg via ORAL
  Filled 2013-05-03 (×2): qty 2

## 2013-05-03 MED ORDER — PANTOPRAZOLE SODIUM 40 MG PO TBEC
40.0000 mg | DELAYED_RELEASE_TABLET | Freq: Every day | ORAL | Status: DC
  Administered 2013-05-03 – 2013-05-04 (×2): 40 mg via ORAL
  Filled 2013-05-03 (×2): qty 1

## 2013-05-03 MED ORDER — ADULT MULTIVITAMIN W/MINERALS CH
1.0000 | ORAL_TABLET | Freq: Every day | ORAL | Status: DC
  Administered 2013-05-03 – 2013-05-04 (×2): 1 via ORAL
  Filled 2013-05-03 (×2): qty 1

## 2013-05-03 MED ORDER — SIMVASTATIN 5 MG PO TABS
5.0000 mg | ORAL_TABLET | Freq: Every day | ORAL | Status: DC
  Administered 2013-05-03: 5 mg via ORAL
  Filled 2013-05-03 (×2): qty 1

## 2013-05-03 MED ORDER — IRBESARTAN 75 MG PO TABS
75.0000 mg | ORAL_TABLET | Freq: Every day | ORAL | Status: DC
  Administered 2013-05-03 – 2013-05-04 (×2): 75 mg via ORAL
  Filled 2013-05-03 (×2): qty 1

## 2013-05-03 MED ORDER — CARVEDILOL 25 MG PO TABS
25.0000 mg | ORAL_TABLET | Freq: Two times a day (BID) | ORAL | Status: DC
  Administered 2013-05-03 – 2013-05-04 (×3): 25 mg via ORAL
  Filled 2013-05-03 (×7): qty 1

## 2013-05-03 MED ORDER — METFORMIN HCL 500 MG PO TABS
500.0000 mg | ORAL_TABLET | Freq: Two times a day (BID) | ORAL | Status: DC
  Administered 2013-05-03 – 2013-05-04 (×3): 500 mg via ORAL
  Filled 2013-05-03 (×3): qty 1

## 2013-05-03 MED ORDER — VITAMIN B-1 100 MG PO TABS
100.0000 mg | ORAL_TABLET | Freq: Every day | ORAL | Status: DC
  Administered 2013-05-03 – 2013-05-04 (×2): 100 mg via ORAL
  Filled 2013-05-03 (×2): qty 1

## 2013-05-03 MED ORDER — DILTIAZEM HCL ER COATED BEADS 360 MG PO CP24
420.0000 mg | ORAL_CAPSULE | Freq: Every day | ORAL | Status: DC
  Filled 2013-05-03 (×2): qty 1

## 2013-05-03 MED ORDER — THIAMINE HCL 100 MG/ML IJ SOLN: 100.0000 mg | Freq: Every day | INTRAMUSCULAR | Status: DC

## 2013-05-03 MED ORDER — FOLIC ACID 1 MG PO TABS
1.0000 mg | ORAL_TABLET | Freq: Every day | ORAL | Status: DC
  Administered 2013-05-03 – 2013-05-04 (×2): 1 mg via ORAL
  Filled 2013-05-03 (×2): qty 1

## 2013-05-03 MED ORDER — LORAZEPAM 1 MG PO TABS: 0.0000 mg | ORAL_TABLET | Freq: Two times a day (BID) | ORAL | Status: DC

## 2013-05-03 MED ORDER — GLIPIZIDE 5 MG PO TABS
5.0000 mg | ORAL_TABLET | Freq: Every day | ORAL | Status: DC
  Administered 2013-05-03 – 2013-05-04 (×2): 5 mg via ORAL
  Filled 2013-05-03 (×3): qty 1

## 2013-05-03 MED ORDER — ASPIRIN EC 81 MG PO TBEC
81.0000 mg | DELAYED_RELEASE_TABLET | Freq: Every day | ORAL | Status: DC
  Administered 2013-05-03 – 2013-05-04 (×2): 81 mg via ORAL
  Filled 2013-05-03 (×2): qty 1

## 2013-05-03 MED ORDER — LORAZEPAM 1 MG PO TABS
0.0000 mg | ORAL_TABLET | Freq: Four times a day (QID) | ORAL | Status: DC
  Administered 2013-05-03: 1 mg via ORAL
  Filled 2013-05-03: qty 1

## 2013-05-03 MED ORDER — OMEGA-3-ACID ETHYL ESTERS 1 G PO CAPS
1.0000 g | ORAL_CAPSULE | Freq: Two times a day (BID) | ORAL | Status: DC
  Administered 2013-05-03 – 2013-05-04 (×3): 1 g via ORAL
  Filled 2013-05-03 (×6): qty 1

## 2013-05-03 MED ORDER — DILTIAZEM HCL ER COATED BEADS 300 MG PO CP24
420.0000 mg | ORAL_CAPSULE | Freq: Every day | ORAL | Status: DC
  Administered 2013-05-03 – 2013-05-04 (×2): 420 mg via ORAL
  Filled 2013-05-03 (×2): qty 1

## 2013-05-03 NOTE — ED Notes (Signed)
ACT made aware of pts. BP.

## 2013-05-03 NOTE — ED Notes (Signed)
Patient  Assisted  To bathroom  With sitter. Sprite given , resting at present.

## 2013-05-03 NOTE — ED Notes (Signed)
Pt c/o headache, Tylenol given for same

## 2013-05-03 NOTE — BH Assessment (Signed)
[facility] Assessment Progress Note      Consulted with Serena Colonel NP re admission for this [facility] patient. She would like her BP to be lower and will re evaluate in the next four or so hours.

## 2013-05-03 NOTE — [facility] Counselor (Signed)
Allison Morn, PA reviewed clinical information and requests a repeat blood alcohol level that indicates BAL<200 before accepting Pt to [facility]. Per Allison Caldwell, [facility] a bed will not be available until after 0800 05/04/13.  Allison Caldwell Allison Caldwell, LPC, [facility] Assessment Counselor

## 2013-05-03 NOTE — ED Notes (Signed)
Patient resting on stretcher  No complaints at present. Sitter with patient.

## 2013-05-03 NOTE — BH Assessment (Signed)
[facility] Assessment Progress Note      Spoke to Fannie Knee, Charity fundraiser, in assessment to report Allison Caldwell's updated vital signs.  Fannie Knee reports that they have decided to let Allison Caldwell review the patient again when he arrives at 1900 in anticipation of transfer to [facility].

## 2013-05-03 NOTE — ED Notes (Signed)
Resting much calmer , sitter remains at bedside.
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
## 2013-05-03 NOTE — BH Assessment (Signed)
[facility] Assessment Progress No                    Referral reviewed by [facility] Berneice Heinrich RN and she feels she is not ready to be considered for admission here due to her unstable BP.

## 2013-05-04 ENCOUNTER — Encounter (HOSPITAL_COMMUNITY): Payer: Self-pay | Admitting: *Deleted

## 2013-05-04 LAB — GLUCOSE, CAPILLARY: Glucose-Capillary: 141 mg/dL — ABNORMAL HIGH (ref 70–99)

## 2013-05-04 NOTE — ED Provider Notes (Addendum)
Patient comfortably eating this AM. On CIWA protocol. Pending placement in detox facility. BP slightly elevated last night, came down today. Otherwise medically stable.   Richardean Canal, MD 05/04/13 0711  11:19 AM telepsych rescinded IVC. Stable for d/c. She is set up for outpatient alcohol rehab.   Richardean Canal, MD 05/04/13 [phone]

## 2013-05-06 NOTE — ED Provider Notes (Signed)
Medical screening examination/treatment/procedure(s) were performed by non-physician practitioner and as supervising physician I was immediately available for consultation/collaboration.  Flint Melter, MD 05/06/13 2153

## 2013-05-09 ENCOUNTER — Other Ambulatory Visit: Payer: Self-pay

## 2013-06-11 ENCOUNTER — Other Ambulatory Visit: Payer: Self-pay | Admitting: *Deleted

## 2013-06-11 DIAGNOSIS — E785 Hyperlipidemia, unspecified: Secondary | ICD-10-CM

## 2013-06-11 MED ORDER — PRAVASTATIN SODIUM 20 MG PO TABS: 20.0000 mg | ORAL_TABLET | Freq: Every day | ORAL | Status: DC

## 2013-06-11 NOTE — Telephone Encounter (Signed)
Insurance would like to convert Rx to 90 day supply. Stanton Kidney Vivek Grealish RN 06/11/13 10AM

## 2013-06-26 ENCOUNTER — Encounter: Payer: Self-pay | Admitting: Internal Medicine

## 2013-07-09 ENCOUNTER — Encounter (HOSPITAL_COMMUNITY): Payer: Self-pay | Admitting: *Deleted

## 2013-07-09 ENCOUNTER — Emergency Department (HOSPITAL_COMMUNITY)
Admission: EM | Admit: 2013-07-09 | Discharge: 2013-07-10 | Disposition: A | Discharge status: Home / Self Care | Payer: Medicare Other | Attending: Emergency Medicine | Admitting: Emergency Medicine

## 2013-07-09 DIAGNOSIS — Z7982 Long term (current) use of aspirin: Secondary | ICD-10-CM | POA: Insufficient documentation

## 2013-07-09 DIAGNOSIS — F411 Generalized anxiety disorder: Secondary | ICD-10-CM | POA: Insufficient documentation

## 2013-07-09 DIAGNOSIS — N390 Urinary tract infection, site not specified: Secondary | ICD-10-CM | POA: Insufficient documentation

## 2013-07-09 DIAGNOSIS — Z79899 Other long term (current) drug therapy: Secondary | ICD-10-CM | POA: Insufficient documentation

## 2013-07-09 DIAGNOSIS — Z8739 Personal history of other diseases of the musculoskeletal system and connective tissue: Secondary | ICD-10-CM | POA: Insufficient documentation

## 2013-07-09 DIAGNOSIS — E119 Type 2 diabetes mellitus without complications: Secondary | ICD-10-CM | POA: Insufficient documentation

## 2013-07-09 DIAGNOSIS — F17200 Nicotine dependence, unspecified, uncomplicated: Secondary | ICD-10-CM | POA: Insufficient documentation

## 2013-07-09 DIAGNOSIS — Z880 Allergy status to penicillin: Secondary | ICD-10-CM | POA: Insufficient documentation

## 2013-07-09 DIAGNOSIS — F329 Major depressive disorder, single episode, unspecified: Secondary | ICD-10-CM | POA: Insufficient documentation

## 2013-07-09 DIAGNOSIS — F3289 Other specified depressive episodes: Secondary | ICD-10-CM | POA: Insufficient documentation

## 2013-07-09 DIAGNOSIS — F10929 Alcohol use, unspecified with intoxication, unspecified: Secondary | ICD-10-CM

## 2013-07-09 DIAGNOSIS — I10 Essential (primary) hypertension: Secondary | ICD-10-CM | POA: Insufficient documentation

## 2013-07-09 DIAGNOSIS — F1010 Alcohol abuse, uncomplicated: Secondary | ICD-10-CM | POA: Insufficient documentation

## 2013-07-09 DIAGNOSIS — Z8673 Personal history of transient ischemic attack (TIA), and cerebral infarction without residual deficits: Secondary | ICD-10-CM | POA: Insufficient documentation

## 2013-07-09 LAB — URINALYSIS, ROUTINE W REFLEX MICROSCOPIC
Glucose, UA: 100 mg/dL — AB
Hgb urine dipstick: NEGATIVE
Ketones, ur: 15 mg/dL — AB
Leukocytes, UA: NEGATIVE
Nitrite: POSITIVE — AB
Protein, ur: 100 mg/dL — AB
Specific Gravity, Urine: >1.03 — ABNORMAL HIGH (ref 1.005–1.030)
Urobilinogen, UA: 1 mg/dL (ref 0.0–1.0)
pH: 5 (ref 5.0–8.0)

## 2013-07-09 LAB — GLUCOSE, CAPILLARY: Glucose-Capillary: 92 mg/dL (ref 70–99)

## 2013-07-09 LAB — CBC
HCT: 44.3 % (ref 36.0–46.0)
Hemoglobin: 15.4 g/dL — ABNORMAL HIGH (ref 12.0–15.0)
MCH: 32.8 pg (ref 26.0–34.0)
MCHC: 34.8 g/dL (ref 30.0–36.0)
MCV: 94.3 fL (ref 78.0–100.0)
Platelets: 155 10*3/uL (ref 150–400)
RBC: 4.7 MIL/uL (ref 3.87–5.11)
RDW: 15.7 % — ABNORMAL HIGH (ref 11.5–15.5)
WBC: 7.9 10*3/uL (ref 4.0–10.5)

## 2013-07-09 LAB — BASIC METABOLIC PANEL
BUN: 8 mg/dL (ref 6–23)
CO2: 22 mEq/L (ref 19–32)
Calcium: 8.9 mg/dL (ref 8.4–10.5)
Chloride: 94 mEq/L — ABNORMAL LOW (ref 96–112)
Creatinine, Ser: 1.12 mg/dL — ABNORMAL HIGH (ref 0.50–1.10)
GFR calc Af Amer: 57 mL/min — ABNORMAL LOW (ref >90)
GFR calc non Af Amer: 49 mL/min — ABNORMAL LOW (ref >90)
Glucose, Bld: 95 mg/dL (ref 70–99)
Potassium: 3.9 mEq/L (ref 3.5–5.1)
Sodium: 135 mEq/L (ref 135–145)

## 2013-07-09 LAB — ETHANOL: Alcohol, Ethyl (B): 226 mg/dL — ABNORMAL HIGH (ref 0–11)

## 2013-07-09 LAB — RAPID URINE DRUG SCREEN, HOSP PERFORMED
Amphetamines: NOT DETECTED
Barbiturates: NOT DETECTED
Benzodiazepines: NOT DETECTED
Cocaine: NOT DETECTED
Opiates: NOT DETECTED
Tetrahydrocannabinol: NOT DETECTED

## 2013-07-09 LAB — URINE MICROSCOPIC-ADD ON

## 2013-07-09 MED ORDER — NITROFURANTOIN MONOHYD MACRO 100 MG PO CAPS
100.0000 mg | ORAL_CAPSULE | Freq: Once | ORAL | Status: AC
  Administered 2013-07-09: 100 mg via ORAL
  Filled 2013-07-09: qty 1

## 2013-07-09 NOTE — ED Notes (Signed)
Pt tells me that she hasnt been feeling well and she wants to be checked and have blood work and cholesterol checked

## 2013-07-09 NOTE — ED Notes (Signed)
Per ems pt is here for etoh intoxication.  She called ems and felt that she needed to be seen for multiple medical complaints.  Per ems pt was jovial en route.  VSS, no neuro deficits.  Pt was ambulatory for ems.

## 2013-07-09 NOTE — ED Provider Notes (Signed)
CSN: [id number]     Arrival date & time 07/09/13  1743 History   First MD Initiated Contact with Patient 07/09/13 1957     Chief Complaint  Patient presents with  . Alcohol Intoxication   (Consider location/radiation/quality/duration/timing/severity/associated sxs/prior Treatment) HPI Comments: PAtient has been having "buzzing in her head" since 2005 and tonight wants to know why.  Admits to drinking beer and vodka today.   Patient is a 67 y.o. female presenting with intoxication. The history is provided by the patient.  Alcohol Intoxication The current episode started more than 1 year ago. The problem occurs constantly. The problem has been unchanged. Pertinent negatives include no abdominal pain, arthralgias, chest pain, fever, headaches, numbness, rash or vertigo. Associated symptoms comments: Buzzing in her head. She has tried nothing for the symptoms. The treatment provided no relief.    Past Medical History  Diagnosis Date  . Hypertension   . Diabetes mellitus     NIDDM  . Obesities, morbid   . TIA (transient ischemic attack)   . Depression   . Degenerative joint disease   . ETOH abuse   . Cardiomyopathy   . Anxiety    Past Surgical History  Procedure Laterality Date  . Orif ankle fracture      right  . Cesarean section      times 2  . Eye surgery      laser - right eye 2010  . Left wrist      surgical repair   Family History  Problem Relation Age of Onset  . Heart disease Mother   . Cancer Father   . Diabetes Sister   . Kidney disease Brother   . Stroke Brother    History  Substance Use Topics  . Smoking status: Current Every Day Smoker -- 0.50 packs/day    Types: Cigarettes  . Smokeless tobacco: Not on file     Comment: used to smoke 1/2 pk aweek; quit 6/11.  Marland Kitchen Alcohol Use: 1.8 oz/week    3 Cans of beer per week     Comment: intoxicated at this time   OB History   Grav Para Term Preterm Abortions TAB SAB Ect Mult Living                 Review of  Systems  Constitutional: Negative for fever.  HENT: Negative for hearing loss, rhinorrhea and ear discharge.   Eyes: Negative for visual disturbance.  Respiratory: Negative for shortness of breath.   Cardiovascular: Negative for chest pain.  Gastrointestinal: Negative for abdominal pain.  Musculoskeletal: Negative for arthralgias.  Skin: Negative for rash.  Neurological: Negative for dizziness, vertigo, numbness and headaches.  All other systems reviewed and are negative.    Allergies  Ace inhibitors and Penicillins  Home Medications   Current Outpatient Rx  Name  Route  Sig  Dispense  Refill  . aspirin EC 81 MG tablet   Oral   Take 81 mg by mouth every morning.          . carvedilol (COREG) 25 MG tablet   Oral   Take 1 tablet (25 mg total) by mouth 2 (two) times daily with a meal.   60 tablet   3   . diltiazem (MATZIM LA) 420 MG 24 hr tablet   Oral   Take 420 mg by mouth every morning.          Marland Kitchen glipiZIDE (GLUCOTROL) 5 MG tablet   Oral   Take 5 mg by  mouth every morning.          . metFORMIN (GLUCOPHAGE) 500 MG tablet   Oral   Take 500 mg by mouth 2 (two) times daily with a meal.         . Multiple Vitamin (MULITIVITAMIN WITH MINERALS) TABS   Oral   Take 1 tablet by mouth daily.         Marland Kitchen olmesartan (BENICAR) 20 MG tablet   Oral   Take 20 mg by mouth every morning.         . pravastatin (PRAVACHOL) 20 MG tablet   Oral   Take 1 tablet (20 mg total) by mouth at bedtime.   90 tablet   3   . sertraline (ZOLOFT) 100 MG tablet   Oral   Take 100 mg by mouth every morning.          . nitrofurantoin, macrocrystal-monohydrate, (MACROBID) 100 MG capsule   Oral   Take 1 capsule (100 mg total) by mouth 2 (two) times daily.   9 capsule   0    BP 107/68  Pulse 70  Temp(Src) 99.1 F (37.3 C) (Oral)  Resp 16  SpO2 97% Physical Exam  Nursing note and vitals reviewed. Constitutional: She is oriented to person, place, and time. She appears  well-developed and well-nourished.  HENT:  Head: Normocephalic.  Right Ear: External ear normal.  Left Ear: External ear normal.  Mouth/Throat: Oropharynx is clear and moist.  Eyes: Pupils are equal, round, and reactive to light.  Neck: Normal range of motion.  Cardiovascular: Normal rate.   Pulmonary/Chest: Effort normal.  Abdominal:  obese  Musculoskeletal: Normal range of motion.  Neurological: She is alert and oriented to person, place, and time.  Skin: Skin is warm. No rash noted.    ED Course  Procedures (including critical care time) Labs Review Labs Reviewed  CBC - Abnormal; Notable for the following:    Hemoglobin 15.4 (*)    RDW 15.7 (*)    All other components within normal limits  BASIC METABOLIC PANEL - Abnormal; Notable for the following:    Chloride 94 (*)    Creatinine, Ser 1.12 (*)    GFR calc non Af Amer 49 (*)    GFR calc Af Amer 57 (*)    All other components within normal limits  URINALYSIS, ROUTINE W REFLEX MICROSCOPIC - Abnormal; Notable for the following:    APPearance CLOUDY (*)    Specific Gravity, Urine >1.030 (*)    Glucose, UA 100 (*)    Bilirubin Urine MODERATE (*)    Ketones, ur 15 (*)    Protein, ur 100 (*)    Nitrite POSITIVE (*)    All other components within normal limits  ETHANOL - Abnormal; Notable for the following:    Alcohol, Ethyl (B) 226 (*)    All other components within normal limits  URINE MICROSCOPIC-ADD ON - Abnormal; Notable for the following:    Squamous Epithelial / LPF FEW (*)    Bacteria, UA MANY (*)    Casts GRANULAR CAST (*)    Crystals CA OXALATE CRYSTALS (*)    All other components within normal limits  GLUCOSE, CAPILLARY  URINE RAPID DRUG SCREEN (HOSP PERFORMED)   Imaging Review No results found.  MDM   1. UTI (lower urinary tract infection)   2. Alcohol intoxication     There are no new symptoms today  "states just got scared" and called EMS Patient has been up in  the emergency room walking.   Crackers, and disorders.  She's been encouraged to find transportation home   Arman Filter, NP 07/10/13 0459  Arman Filter, NP 07/10/13 [phone]

## 2013-07-09 NOTE — ED Notes (Signed)
Pt arrived to room C24 with acknowledgement of having all belongings with person. Pt ambulated with a steady gait and assisted to bed, brought water and blankets.

## 2013-07-09 NOTE — ED Notes (Signed)
CBG is 92. Notified Nurse Alvino Chapel.

## 2013-07-10 ENCOUNTER — Other Ambulatory Visit: Payer: Self-pay | Admitting: Internal Medicine

## 2013-07-10 DIAGNOSIS — I429 Cardiomyopathy, unspecified: Secondary | ICD-10-CM

## 2013-07-10 MED ORDER — NITROFURANTOIN MONOHYD MACRO 100 MG PO CAPS: 100.0000 mg | ORAL_CAPSULE | Freq: Two times a day (BID) | ORAL | Status: DC

## 2013-07-10 NOTE — ED Provider Notes (Signed)
  This was a shared visit with a mid-level provided (NP or PA).  Throughout the patient's course I was available for consultation/collaboration.  On my exam the patient was intoxicated, somnolent, but moving all extremities spontaneously. With her intoxication, she required a period of observation and monitoring for reassessment once she was clinically sober.     Gerhard Munch, MD 07/10/13 1840

## 2013-07-10 NOTE — ED Notes (Signed)
Pt up to restroom where she voided. Given saltines and gingerale.

## 2013-09-21 ENCOUNTER — Emergency Department (HOSPITAL_COMMUNITY)
Admission: EM | Admit: 2013-09-21 | Discharge: 2013-09-21 | Disposition: A | Discharge status: Home / Self Care | Payer: Medicare Other | Attending: Emergency Medicine | Admitting: Emergency Medicine

## 2013-09-21 ENCOUNTER — Emergency Department (HOSPITAL_COMMUNITY): Payer: Medicare Other

## 2013-09-21 ENCOUNTER — Encounter (HOSPITAL_COMMUNITY): Payer: Self-pay | Admitting: Emergency Medicine

## 2013-09-21 DIAGNOSIS — I10 Essential (primary) hypertension: Secondary | ICD-10-CM | POA: Insufficient documentation

## 2013-09-21 DIAGNOSIS — Z7982 Long term (current) use of aspirin: Secondary | ICD-10-CM | POA: Insufficient documentation

## 2013-09-21 DIAGNOSIS — R5381 Other malaise: Secondary | ICD-10-CM | POA: Insufficient documentation

## 2013-09-21 DIAGNOSIS — E119 Type 2 diabetes mellitus without complications: Secondary | ICD-10-CM | POA: Insufficient documentation

## 2013-09-21 DIAGNOSIS — R209 Unspecified disturbances of skin sensation: Secondary | ICD-10-CM | POA: Insufficient documentation

## 2013-09-21 DIAGNOSIS — Z880 Allergy status to penicillin: Secondary | ICD-10-CM | POA: Insufficient documentation

## 2013-09-21 DIAGNOSIS — R0602 Shortness of breath: Secondary | ICD-10-CM | POA: Diagnosis not present

## 2013-09-21 DIAGNOSIS — Z79899 Other long term (current) drug therapy: Secondary | ICD-10-CM | POA: Diagnosis not present

## 2013-09-21 DIAGNOSIS — E876 Hypokalemia: Secondary | ICD-10-CM | POA: Diagnosis not present

## 2013-09-21 DIAGNOSIS — R05 Cough: Secondary | ICD-10-CM | POA: Insufficient documentation

## 2013-09-21 DIAGNOSIS — R197 Diarrhea, unspecified: Secondary | ICD-10-CM | POA: Insufficient documentation

## 2013-09-21 DIAGNOSIS — R059 Cough, unspecified: Secondary | ICD-10-CM | POA: Diagnosis not present

## 2013-09-21 DIAGNOSIS — Z8673 Personal history of transient ischemic attack (TIA), and cerebral infarction without residual deficits: Secondary | ICD-10-CM | POA: Diagnosis not present

## 2013-09-21 DIAGNOSIS — Z8739 Personal history of other diseases of the musculoskeletal system and connective tissue: Secondary | ICD-10-CM | POA: Insufficient documentation

## 2013-09-21 DIAGNOSIS — F17200 Nicotine dependence, unspecified, uncomplicated: Secondary | ICD-10-CM | POA: Diagnosis not present

## 2013-09-21 DIAGNOSIS — F3289 Other specified depressive episodes: Secondary | ICD-10-CM | POA: Insufficient documentation

## 2013-09-21 DIAGNOSIS — R112 Nausea with vomiting, unspecified: Secondary | ICD-10-CM | POA: Diagnosis not present

## 2013-09-21 DIAGNOSIS — F329 Major depressive disorder, single episode, unspecified: Secondary | ICD-10-CM | POA: Diagnosis not present

## 2013-09-21 DIAGNOSIS — F411 Generalized anxiety disorder: Secondary | ICD-10-CM | POA: Diagnosis not present

## 2013-09-21 LAB — CBC WITH DIFFERENTIAL/PLATELET
Basophils Absolute: 0 10*3/uL (ref 0.0–0.1)
Basophils Relative: 0 % (ref 0–1)
Eosinophils Absolute: 0.1 10*3/uL (ref 0.0–0.7)
Eosinophils Relative: 1 % (ref 0–5)
HCT: 43.9 % (ref 36.0–46.0)
Hemoglobin: 15.6 g/dL — ABNORMAL HIGH (ref 12.0–15.0)
Lymphocytes Relative: 37 % (ref 12–46)
Lymphs Abs: 2.9 10*3/uL (ref 0.7–4.0)
MCH: 33 pg (ref 26.0–34.0)
MCHC: 35.5 g/dL (ref 30.0–36.0)
MCV: 92.8 fL (ref 78.0–100.0)
Monocytes Absolute: 0.8 10*3/uL (ref 0.1–1.0)
Monocytes Relative: 10 % (ref 3–12)
Neutro Abs: 3.9 10*3/uL (ref 1.7–7.7)
Neutrophils Relative %: 51 % (ref 43–77)
Platelets: 132 10*3/uL — ABNORMAL LOW (ref 150–400)
RBC: 4.73 MIL/uL (ref 3.87–5.11)
RDW: 14.8 % (ref 11.5–15.5)
WBC: 7.7 10*3/uL (ref 4.0–10.5)

## 2013-09-21 LAB — URINALYSIS, ROUTINE W REFLEX MICROSCOPIC
Bilirubin Urine: NEGATIVE
Glucose, UA: NEGATIVE mg/dL
Hgb urine dipstick: NEGATIVE
Ketones, ur: NEGATIVE mg/dL
Leukocytes, UA: NEGATIVE
Nitrite: NEGATIVE
Protein, ur: NEGATIVE mg/dL
Specific Gravity, Urine: 1.01 (ref 1.005–1.030)
Urobilinogen, UA: 1 mg/dL (ref 0.0–1.0)
pH: 6 (ref 5.0–8.0)

## 2013-09-21 LAB — COMPREHENSIVE METABOLIC PANEL
ALT: 25 U/L (ref 0–35)
AST: 30 U/L (ref 0–37)
Albumin: 3.9 g/dL (ref 3.5–5.2)
Alkaline Phosphatase: 45 U/L (ref 39–117)
BUN: 8 mg/dL (ref 6–23)
CO2: 27 mEq/L (ref 19–32)
Calcium: 9.2 mg/dL (ref 8.4–10.5)
Chloride: 97 mEq/L (ref 96–112)
Creatinine, Ser: 0.63 mg/dL (ref 0.50–1.10)
GFR calc Af Amer: >90 mL/min (ref >90)
GFR calc non Af Amer: 89 mL/min — ABNORMAL LOW (ref >90)
Glucose, Bld: 191 mg/dL — ABNORMAL HIGH (ref 70–99)
Potassium: 3.4 mEq/L — ABNORMAL LOW (ref 3.5–5.1)
Sodium: 138 mEq/L (ref 135–145)
Total Bilirubin: 0.7 mg/dL (ref 0.3–1.2)
Total Protein: 7.7 g/dL (ref 6.0–8.3)

## 2013-09-21 LAB — POCT I-STAT, CHEM 8
BUN: 6 mg/dL (ref 6–23)
Calcium, Ion: 1.11 mmol/L — ABNORMAL LOW (ref 1.13–1.30)
Chloride: 99 mEq/L (ref 96–112)
Creatinine, Ser: 0.9 mg/dL (ref 0.50–1.10)
Glucose, Bld: 187 mg/dL — ABNORMAL HIGH (ref 70–99)
HCT: 49 % — ABNORMAL HIGH (ref 36.0–46.0)
Hemoglobin: 16.7 g/dL — ABNORMAL HIGH (ref 12.0–15.0)
Potassium: 3.1 mEq/L — ABNORMAL LOW (ref 3.5–5.1)
Sodium: 139 mEq/L (ref 135–145)
TCO2: 28 mmol/L (ref 0–100)

## 2013-09-21 MED ORDER — ONDANSETRON HCL 8 MG PO TABS: 8.0000 mg | ORAL_TABLET | Freq: Three times a day (TID) | ORAL | Status: DC | PRN

## 2013-09-21 MED ORDER — SODIUM CHLORIDE 0.9 % IV BOLUS (SEPSIS)
1000.0000 mL | Freq: Once | INTRAVENOUS | Status: AC
  Administered 2013-09-21: 1000 mL via INTRAVENOUS

## 2013-09-21 MED ORDER — POTASSIUM CHLORIDE CRYS ER 20 MEQ PO TBCR
40.0000 meq | EXTENDED_RELEASE_TABLET | Freq: Once | ORAL | Status: AC
  Administered 2013-09-21: 40 meq via ORAL
  Filled 2013-09-21: qty 2

## 2013-09-21 NOTE — ED Notes (Signed)
Pt. reports nausea , vomitting and diarrhea with occasional productive cough for several days , denies fever or chills. Respirations unlabored .

## 2013-09-21 NOTE — ED Notes (Signed)
Pt alert and mentating appropriately upon d/c. Pt given d/c teaching, prescription, and follow up care instructions. Pt verbalizes understanding of d/c teaching and has no further questions upon d/c. Pt ambulatory leaving with cane from home. Pt requested a bus pass and was given one per charge. NAD noted upon d/c. Pt leaving ER with paperwork.
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
## 2013-09-21 NOTE — ED Provider Notes (Signed)
CSN: [id number]     Arrival date & time 09/21/13  0229 History   First MD Initiated Contact with Patient 09/21/13 0322     Chief Complaint  Patient presents with  . Emesis  . Diarrhea   (Consider location/radiation/quality/duration/timing/severity/associated sxs/prior Treatment) Patient is a 67 y.o. female presenting with vomiting and diarrhea.  Emesis Associated symptoms: diarrhea   Diarrhea Associated symptoms: vomiting    Complains of vomiting and diarrhea onset 24 hours ago. Admits to possibly 4 episodes of vomiting and 2 episodes of diarrhea. She does admits to cough for 2 weeks productive of white sputum. No fever. Admits to generalized weakness. No other associated symptoms. No treatment prior to coming here. Denies nausea at present Past Medical History  Diagnosis Date  . Hypertension   . Diabetes mellitus     NIDDM  . Obesities, morbid   . TIA (transient ischemic attack)   . Depression   . Degenerative joint disease   . ETOH abuse   . Cardiomyopathy   . Anxiety    Past Surgical History  Procedure Laterality Date  . Orif ankle fracture      right  . Cesarean section      times 2  . Eye surgery      laser - right eye 2010  . Left wrist      surgical repair   Family History  Problem Relation Age of Onset  . Heart disease Mother   . Cancer Father   . Diabetes Sister   . Kidney disease Brother   . Stroke Brother    History  Substance Use Topics  . Smoking status: Current Every Day Smoker -- 0.50 packs/day    Types: Cigarettes  . Smokeless tobacco: Not on file     Comment: used to smoke 1/2 pk aweek; quit 6/11.  Marland Kitchen Alcohol Use: 1.8 oz/week    3 Cans of beer per week     Comment: intoxicated at this time   OB History   Grav Para Term Preterm Abortions TAB SAB Ect Mult Living                 Review of Systems  HENT: Negative.   Respiratory: Positive for cough and shortness of breath.         Chronic dyspnea times greater than one year. Unchanged   Cardiovascular: Negative.   Gastrointestinal: Positive for vomiting and diarrhea.  Musculoskeletal: Negative.   Skin: Negative.   Neurological: Positive for weakness and numbness.       Generalized weakness. Chronic numbness in toes due to neuropathy  Psychiatric/Behavioral: Negative.   All other systems reviewed and are negative.    Allergies  Ace inhibitors and Penicillins  Home Medications   Current Outpatient Rx  Name  Route  Sig  Dispense  Refill  . aspirin EC 81 MG tablet   Oral   Take 81 mg by mouth every morning.          . carvedilol (COREG) 25 MG tablet   Oral   Take 25 mg by mouth 2 (two) times daily with a meal.         . diltiazem (MATZIM LA) 420 MG 24 hr tablet   Oral   Take 420 mg by mouth every morning.          Marland Kitchen glipiZIDE (GLUCOTROL) 5 MG tablet   Oral   Take 5 mg by mouth every morning.          Marland Kitchen  metFORMIN (GLUCOPHAGE) 500 MG tablet   Oral   Take 500 mg by mouth 2 (two) times daily with a meal.         . Multiple Vitamin (MULITIVITAMIN WITH MINERALS) TABS   Oral   Take 1 tablet by mouth daily.         Marland Kitchen olmesartan (BENICAR) 20 MG tablet   Oral   Take 20 mg by mouth every morning.         . pravastatin (PRAVACHOL) 20 MG tablet   Oral   Take 1 tablet (20 mg total) by mouth at bedtime.   90 tablet   3   . sertraline (ZOLOFT) 100 MG tablet   Oral   Take 100 mg by mouth every morning.           BP 173/108  Pulse 77  Temp(Src) 97.8 F (36.6 C) (Oral)  Resp 18  Wt 259 lb (117.482 kg)  SpO2 97% Physical Exam  Nursing note and vitals reviewed. Constitutional: She appears well-developed and well-nourished.  HENT:  Head: Normocephalic and atraumatic.  Mucous membranes dry  Eyes: Conjunctivae are normal. Pupils are equal, round, and reactive to light.  Neck: Neck supple. No tracheal deviation present. No thyromegaly present.  Cardiovascular: Normal rate and regular rhythm.   No murmur heard. Pulmonary/Chest: Effort  normal and breath sounds normal.  Abdominal: Soft. Bowel sounds are normal. She exhibits no distension. There is no tenderness.  Obese  Musculoskeletal: Normal range of motion. She exhibits no edema and no tenderness.  Neurological: She is alert. Coordination normal.  Skin: Skin is warm and dry. No rash noted.  Psychiatric: She has a normal mood and affect.    ED Course  Procedures (including critical care time) Labs Review Labs Reviewed  CBC WITH DIFFERENTIAL - Abnormal; Notable for the following:    Hemoglobin 15.6 (*)    Platelets 132 (*)    All other components within normal limits  COMPREHENSIVE METABOLIC PANEL - Abnormal; Notable for the following:    Potassium 3.4 (*)    Glucose, Bld 191 (*)    GFR calc non Af Amer 89 (*)    All other components within normal limits   Results for orders placed during the hospital encounter of 09/21/13  CBC WITH DIFFERENTIAL      Result Value Range   WBC 7.7  4.0 - 10.5 K/uL   RBC 4.73  3.87 - 5.11 MIL/uL   Hemoglobin 15.6 (*) 12.0 - 15.0 g/dL   HCT 16.1  09.6 - 04.5 %   MCV 92.8  78.0 - 100.0 fL   MCH 33.0  26.0 - 34.0 pg   MCHC 35.5  30.0 - 36.0 g/dL   RDW 40.9  81.1 - 91.4 %   Platelets 132 (*) 150 - 400 K/uL   Neutrophils Relative % 51  43 - 77 %   Neutro Abs 3.9  1.7 - 7.7 K/uL   Lymphocytes Relative 37  12 - 46 %   Lymphs Abs 2.9  0.7 - 4.0 K/uL   Monocytes Relative 10  3 - 12 %   Monocytes Absolute 0.8  0.1 - 1.0 K/uL   Eosinophils Relative 1  0 - 5 %   Eosinophils Absolute 0.1  0.0 - 0.7 K/uL   Basophils Relative 0  0 - 1 %   Basophils Absolute 0.0  0.0 - 0.1 K/uL  COMPREHENSIVE METABOLIC PANEL      Result Value Range   Sodium 138  135 - 145 mEq/L   Potassium 3.4 (*) 3.5 - 5.1 mEq/L   Chloride 97  96 - 112 mEq/L   CO2 27  19 - 32 mEq/L   Glucose, Bld 191 (*) 70 - 99 mg/dL   BUN 8  6 - 23 mg/dL   Creatinine, Ser 8.11  0.50 - 1.10 mg/dL   Calcium 9.2  8.4 - 91.4 mg/dL   Total Protein 7.7  6.0 - 8.3 g/dL   Albumin  3.9  3.5 - 5.2 g/dL   AST 30  0 - 37 U/L   ALT 25  0 - 35 U/L   Alkaline Phosphatase 45  39 - 117 U/L   Total Bilirubin 0.7  0.3 - 1.2 mg/dL   GFR calc non Af Amer 89 (*) >90 mL/min   GFR calc Af Amer >90  >90 mL/min  URINALYSIS, ROUTINE W REFLEX MICROSCOPIC      Result Value Range   Color, Urine YELLOW  YELLOW   APPearance CLEAR  CLEAR   Specific Gravity, Urine 1.010  1.005 - 1.030   pH 6.0  5.0 - 8.0   Glucose, UA NEGATIVE  NEGATIVE mg/dL   Hgb urine dipstick NEGATIVE  NEGATIVE   Bilirubin Urine NEGATIVE  NEGATIVE   Ketones, ur NEGATIVE  NEGATIVE mg/dL   Protein, ur NEGATIVE  NEGATIVE mg/dL   Urobilinogen, UA 1.0  0.0 - 1.0 mg/dL   Nitrite NEGATIVE  NEGATIVE   Leukocytes, UA NEGATIVE  NEGATIVE  POCT I-STAT, CHEM 8      Result Value Range   Sodium 139  135 - 145 mEq/L   Potassium 3.1 (*) 3.5 - 5.1 mEq/L   Chloride 99  96 - 112 mEq/L   BUN 6  6 - 23 mg/dL   Creatinine, Ser 7.82  0.50 - 1.10 mg/dL   Glucose, Bld 956 (*) 70 - 99 mg/dL   Calcium, Ion 2.13 (*) 1.13 - 1.30 mmol/L   TCO2 28  0 - 100 mmol/L   Hemoglobin 16.7 (*) 12.0 - 15.0 g/dL   HCT 08.6 (*) 57.8 - 46.9 %   Dg Chest 2 View  09/21/2013   CLINICAL DATA:  Cough  EXAM: CHEST  2 VIEW  COMPARISON:  11/21/2011  FINDINGS: Chronic cardiomegaly. Mild scarring in the mid and lower lungs. No infiltrate or edema. No effusion or pneumothorax.  IMPRESSION: Cardiomegaly without failure or pneumonia.   Electronically Signed   By: Tiburcio Pea M.D.   On: 09/21/2013 04:41    Imaging Review No results found.  EKG Interpretation   None      7:45 AM patient feels improved after treatment with intravenous fluids. Drank water without difficulty. She is presently hungry Chest x-ray reviewed by me to MDM  No diagnosis found. Suspect viral illness. Plan prescription for Zofran, Imodium for diarrhea Followup with outpatient clinic if not better in 3 or 4 days Diagnosis #1 nausea vomiting diarrhea #2 cough #3 hypokalemia #4  hypertension #5 hyperglycemia   Doug Sou, MD 09/21/13 [phone]

## 2013-10-17 ENCOUNTER — Other Ambulatory Visit: Payer: Self-pay | Admitting: *Deleted

## 2013-10-17 DIAGNOSIS — I10 Essential (primary) hypertension: Secondary | ICD-10-CM

## 2013-10-17 MED ORDER — OLMESARTAN MEDOXOMIL 20 MG PO TABS: 20.0000 mg | ORAL_TABLET | Freq: Every morning | ORAL | Status: DC

## 2013-11-27 ENCOUNTER — Encounter: Payer: Self-pay | Admitting: Internal Medicine

## 2014-01-25 ENCOUNTER — Emergency Department (HOSPITAL_COMMUNITY)
Admission: EM | Admit: 2014-01-25 | Discharge: 2014-01-25 | Disposition: A | Discharge status: Home / Self Care | Payer: Medicare Other | Attending: Emergency Medicine | Admitting: Emergency Medicine

## 2014-01-25 ENCOUNTER — Encounter (HOSPITAL_COMMUNITY): Payer: Self-pay | Admitting: Emergency Medicine

## 2014-01-25 ENCOUNTER — Emergency Department (HOSPITAL_COMMUNITY): Payer: Medicare Other

## 2014-01-25 DIAGNOSIS — I10 Essential (primary) hypertension: Secondary | ICD-10-CM | POA: Insufficient documentation

## 2014-01-25 DIAGNOSIS — F17200 Nicotine dependence, unspecified, uncomplicated: Secondary | ICD-10-CM | POA: Insufficient documentation

## 2014-01-25 DIAGNOSIS — Z7982 Long term (current) use of aspirin: Secondary | ICD-10-CM | POA: Insufficient documentation

## 2014-01-25 DIAGNOSIS — F1010 Alcohol abuse, uncomplicated: Secondary | ICD-10-CM | POA: Insufficient documentation

## 2014-01-25 DIAGNOSIS — Z8673 Personal history of transient ischemic attack (TIA), and cerebral infarction without residual deficits: Secondary | ICD-10-CM | POA: Insufficient documentation

## 2014-01-25 DIAGNOSIS — F3289 Other specified depressive episodes: Secondary | ICD-10-CM | POA: Insufficient documentation

## 2014-01-25 DIAGNOSIS — R110 Nausea: Secondary | ICD-10-CM | POA: Diagnosis not present

## 2014-01-25 DIAGNOSIS — Z8739 Personal history of other diseases of the musculoskeletal system and connective tissue: Secondary | ICD-10-CM | POA: Insufficient documentation

## 2014-01-25 DIAGNOSIS — F411 Generalized anxiety disorder: Secondary | ICD-10-CM | POA: Insufficient documentation

## 2014-01-25 DIAGNOSIS — Z880 Allergy status to penicillin: Secondary | ICD-10-CM | POA: Insufficient documentation

## 2014-01-25 DIAGNOSIS — R109 Unspecified abdominal pain: Secondary | ICD-10-CM | POA: Diagnosis not present

## 2014-01-25 DIAGNOSIS — Z79899 Other long term (current) drug therapy: Secondary | ICD-10-CM | POA: Insufficient documentation

## 2014-01-25 DIAGNOSIS — E876 Hypokalemia: Secondary | ICD-10-CM

## 2014-01-25 DIAGNOSIS — F10929 Alcohol use, unspecified with intoxication, unspecified: Secondary | ICD-10-CM

## 2014-01-25 DIAGNOSIS — E119 Type 2 diabetes mellitus without complications: Secondary | ICD-10-CM | POA: Insufficient documentation

## 2014-01-25 DIAGNOSIS — F329 Major depressive disorder, single episode, unspecified: Secondary | ICD-10-CM | POA: Insufficient documentation

## 2014-01-25 DIAGNOSIS — R112 Nausea with vomiting, unspecified: Secondary | ICD-10-CM

## 2014-01-25 LAB — URINALYSIS, ROUTINE W REFLEX MICROSCOPIC
Bilirubin Urine: NEGATIVE
Glucose, UA: NEGATIVE mg/dL
Hgb urine dipstick: NEGATIVE
Ketones, ur: NEGATIVE mg/dL
Leukocytes, UA: NEGATIVE
Nitrite: NEGATIVE
Protein, ur: NEGATIVE mg/dL
Specific Gravity, Urine: 1.004 — ABNORMAL LOW (ref 1.005–1.030)
Urobilinogen, UA: 0.2 mg/dL (ref 0.0–1.0)
pH: 5 (ref 5.0–8.0)

## 2014-01-25 LAB — COMPREHENSIVE METABOLIC PANEL
ALT: 17 U/L (ref 0–35)
AST: 20 U/L (ref 0–37)
Albumin: 3.6 g/dL (ref 3.5–5.2)
Alkaline Phosphatase: 40 U/L (ref 39–117)
BUN: 9 mg/dL (ref 6–23)
CO2: 21 mEq/L (ref 19–32)
Calcium: 8.8 mg/dL (ref 8.4–10.5)
Chloride: 96 mEq/L (ref 96–112)
Creatinine, Ser: 0.62 mg/dL (ref 0.50–1.10)
GFR calc Af Amer: >90 mL/min (ref >90)
GFR calc non Af Amer: 89 mL/min — ABNORMAL LOW (ref >90)
Glucose, Bld: 87 mg/dL (ref 70–99)
Potassium: 3 mEq/L — ABNORMAL LOW (ref 3.7–5.3)
Sodium: 136 mEq/L — ABNORMAL LOW (ref 137–147)
Total Bilirubin: 0.4 mg/dL (ref 0.3–1.2)
Total Protein: 7.3 g/dL (ref 6.0–8.3)

## 2014-01-25 LAB — CBC WITH DIFFERENTIAL/PLATELET
Basophils Absolute: 0 10*3/uL (ref 0.0–0.1)
Basophils Relative: 0 % (ref 0–1)
Eosinophils Absolute: 0.1 10*3/uL (ref 0.0–0.7)
Eosinophils Relative: 1 % (ref 0–5)
HCT: 40.6 % (ref 36.0–46.0)
Hemoglobin: 13.9 g/dL (ref 12.0–15.0)
Lymphocytes Relative: 47 % — ABNORMAL HIGH (ref 12–46)
Lymphs Abs: 4.4 10*3/uL — ABNORMAL HIGH (ref 0.7–4.0)
MCH: 32.4 pg (ref 26.0–34.0)
MCHC: 34.2 g/dL (ref 30.0–36.0)
MCV: 94.6 fL (ref 78.0–100.0)
Monocytes Absolute: 0.7 10*3/uL (ref 0.1–1.0)
Monocytes Relative: 8 % (ref 3–12)
Neutro Abs: 4.3 10*3/uL (ref 1.7–7.7)
Neutrophils Relative %: 45 % (ref 43–77)
Platelets: 205 10*3/uL (ref 150–400)
RBC: 4.29 MIL/uL (ref 3.87–5.11)
RDW: 14.5 % (ref 11.5–15.5)
WBC: 9.6 10*3/uL (ref 4.0–10.5)

## 2014-01-25 LAB — I-STAT TROPONIN, ED: Troponin i, poc: 0 ng/mL (ref 0.00–0.08)

## 2014-01-25 LAB — LIPASE, BLOOD: Lipase: 52 U/L (ref 11–59)

## 2014-01-25 LAB — ETHANOL: Alcohol, Ethyl (B): 246 mg/dL — ABNORMAL HIGH (ref 0–11)

## 2014-01-25 MED ORDER — ONDANSETRON HCL 4 MG PO TABS: 4.0000 mg | ORAL_TABLET | Freq: Four times a day (QID) | ORAL | Status: DC

## 2014-01-25 MED ORDER — ONDANSETRON HCL 4 MG/2ML IJ SOLN
4.0000 mg | Freq: Once | INTRAMUSCULAR | Status: AC
  Administered 2014-01-25: 4 mg via INTRAVENOUS
  Filled 2014-01-25: qty 2

## 2014-01-25 MED ORDER — SODIUM CHLORIDE 0.9 % IV SOLN
1000.0000 mL | Freq: Once | INTRAVENOUS | Status: AC
  Administered 2014-01-25: 1000 mL via INTRAVENOUS

## 2014-01-25 MED ORDER — SODIUM CHLORIDE 0.9 % IV SOLN: 1000.0000 mL | INTRAVENOUS | Status: DC

## 2014-01-25 MED ORDER — POTASSIUM CHLORIDE CRYS ER 20 MEQ PO TBCR
40.0000 meq | EXTENDED_RELEASE_TABLET | Freq: Once | ORAL | Status: AC
  Administered 2014-01-25: 40 meq via ORAL
  Filled 2014-01-25: qty 2

## 2014-01-25 NOTE — ED Provider Notes (Signed)
CSN: [id number]     Arrival date & time 01/25/14  1920 History   First MD Initiated Contact with Patient 01/25/14 1929     Chief Complaint  Patient presents with  . Nausea   HPI Patient presents to the emergency room with complaints of nausea after eating leftover pizza from last night. Patient states she had some pizza last night and vomited once then. This morning she tried eating the pizza again and had another episode of vomiting. She does feel like she's had some abdominal discomfort that is like a gas bubble. She feels it in her back.  The symptoms are mild. She has not had any diarrhea. She's not having any fever. She does not have any abdominal discomfort now .she denies any trouble with headache or shortness of breath.  She denies any chest pain.  Patient told EMS that she was also drinking a sixpack of beer per Past Medical History  Diagnosis Date  . Hypertension   . Diabetes mellitus     NIDDM  . Obesities, morbid   . TIA (transient ischemic attack)   . Depression   . Degenerative joint disease   . ETOH abuse   . Cardiomyopathy   . Anxiety    Past Surgical History  Procedure Laterality Date  . Orif ankle fracture      right  . Cesarean section      times 2  . Eye surgery      laser - right eye 2010  . Left wrist      surgical repair   Family History  Problem Relation Age of Onset  . Heart disease Mother   . Cancer Father   . Diabetes Sister   . Kidney disease Brother   . Stroke Brother    History  Substance Use Topics  . Smoking status: Current Every Day Smoker -- 0.50 packs/day    Types: Cigarettes  . Smokeless tobacco: Not on file     Comment: used to smoke 1/2 pk aweek; quit 6/11.  Marland Kitchen Alcohol Use: 1.8 oz/week    3 Cans of beer per week     Comment: intoxicated at this time   OB History   Grav Para Term Preterm Abortions TAB SAB Ect Mult Living                 Review of Systems  Musculoskeletal:       Patient's feet feel cold  All other systems  reviewed and are negative.      Allergies  Ace inhibitors and Penicillins  Home Medications   Current Outpatient Rx  Name  Route  Sig  Dispense  Refill  . aspirin EC 81 MG tablet   Oral   Take 81 mg by mouth every morning.          . carvedilol (COREG) 25 MG tablet   Oral   Take 25 mg by mouth 2 (two) times daily with a meal.         . diltiazem (MATZIM LA) 420 MG 24 hr tablet   Oral   Take 420 mg by mouth every morning.          Marland Kitchen glipiZIDE (GLUCOTROL) 5 MG tablet   Oral   Take 5 mg by mouth every morning.          . metFORMIN (GLUCOPHAGE) 500 MG tablet   Oral   Take 500 mg by mouth 2 (two) times daily with a meal.         .  Multiple Vitamin (MULITIVITAMIN WITH MINERALS) TABS   Oral   Take 1 tablet by mouth daily.         Marland Kitchen olmesartan (BENICAR) 20 MG tablet   Oral   Take 1 tablet (20 mg total) by mouth every morning.   30 tablet   5   . omeprazole (PRILOSEC) 20 MG capsule   Oral   Take 20 mg by mouth every evening.         . ondansetron (ZOFRAN) 8 MG tablet   Oral   Take 1 tablet (8 mg total) by mouth every 8 (eight) hours as needed for nausea.   12 tablet   0   . pravastatin (PRAVACHOL) 20 MG tablet   Oral   Take 1 tablet (20 mg total) by mouth at bedtime.   90 tablet   3   . sertraline (ZOLOFT) 100 MG tablet   Oral   Take 100 mg by mouth every morning.          . ondansetron (ZOFRAN) 4 MG tablet   Oral   Take 1 tablet (4 mg total) by mouth every 6 (six) hours.   12 tablet   0    BP 133/89  Pulse 73  Temp(Src) 97.7 F (36.5 C) (Oral)  Resp 16  SpO2 96% Physical Exam  Nursing note and vitals reviewed. Constitutional: She appears well-developed and well-nourished. No distress.  HENT:  Head: Normocephalic and atraumatic.  Right Ear: External ear normal.  Left Ear: External ear normal.  Eyes: Conjunctivae are normal. Right eye exhibits no discharge. Left eye exhibits no discharge. No scleral icterus.  Neck: Neck supple.  No tracheal deviation present.  Cardiovascular: Normal rate, regular rhythm and intact distal pulses.   Pulses:      Dorsalis pedis pulses are 2+ on the right side, and 2+ on the left side.  Pulmonary/Chest: Effort normal and breath sounds normal. No stridor. No respiratory distress. She has no wheezes. She has no rales.  Abdominal: Soft. Bowel sounds are normal. She exhibits no distension, no ascites, no pulsatile midline mass and no mass. There is no tenderness. There is no rebound and no guarding. No hernia.  Musculoskeletal: She exhibits no edema and no tenderness.  Neurological: She is alert. She has normal strength. No cranial nerve deficit (no facial droop, extraocular movements intact, no slurred speech) or sensory deficit. She exhibits normal muscle tone. She displays no seizure activity. Coordination normal.  Skin: Skin is warm and dry. No rash noted.  Psychiatric: She has a normal mood and affect.    ED Course  Procedures (including critical care time) Labs Review Labs Reviewed  CBC WITH DIFFERENTIAL - Abnormal; Notable for the following:    Lymphocytes Relative 47 (*)    Lymphs Abs 4.4 (*)    All other components within normal limits  COMPREHENSIVE METABOLIC PANEL - Abnormal; Notable for the following:    Sodium 136 (*)    Potassium 3.0 (*)    GFR calc non Af Amer 89 (*)    All other components within normal limits  ETHANOL - Abnormal; Notable for the following:    Alcohol, Ethyl (B) 246 (*)    All other components within normal limits  LIPASE, BLOOD  URINALYSIS, ROUTINE W REFLEX MICROSCOPIC  I-STAT TROPOININ, ED   Imaging Review Dg Abd Acute W/chest  01/25/2014   CLINICAL DATA:  Abdominal pain and nausea.  Chest pain.  EXAM: ACUTE ABDOMEN SERIES (ABDOMEN 2 VIEW & CHEST 1 VIEW)  COMPARISON:  09/21/2013  FINDINGS: Normal bowel gas pattern. Several phleboliths are noted in the pelvis. No evidence of renal or ureteral stones. Soft tissues are otherwise unremarkable.   Cardiac silhouette is mildly enlarged. Clear lungs. Bony thorax is demineralized. There are degenerative changes throughout the visualized spine.  IMPRESSION: 1. No acute findings.  No obstruction or free air. 2. No acute cardiopulmonary disease.   Electronically Signed   By: Lajean Manes M.D.   On: 01/25/2014 20:22     EKG Interpretation   Date/Time:  Saturday January 25 2014 20:39:07 EDT Ventricular Rate:  66 PR Interval:  213 QRS Duration: 102 QT Interval:  513 QTC Calculation: 538 R Axis:   -57 Text Interpretation:  Sinus rhythm Borderline prolonged PR interval LAD,  consider left anterior fascicular block Anteroseptal infarct, age  indeterminate Prolonged QT interval No significant change since last  tracing Confirmed by Tifini Reeder  MD-J, Lively Haberman ([id number]) on 01/25/2014 8:51:15 PM     Medications  0.9 %  sodium chloride infusion (1,000 mLs Intravenous New Bag/Given 01/25/14 2135)    Followed by  0.9 %  sodium chloride infusion (not administered)  potassium chloride SA (K-DUR,KLOR-CON) CR tablet 40 mEq (not administered)  ondansetron (ZOFRAN) injection 4 mg (4 mg Intravenous Given 01/25/14 2137)   2140  Pt is feeling better.  Discussed results.  She is ready to go home. MDM   Final diagnoses:  Nausea & vomiting  Alcohol intoxication   Pt without nausea and vomiting in the ED.  Her abdominal exam is benign.  Pt does have a history of alcohol abuse.  Presents with similar symptoms today.  At this time there does not appear to be any evidence of an acute emergency medical condition and the patient appears stable for discharge with appropriate outpatient follow up.       Kathalene Frames, MD 01/25/14 [phone]

## 2014-01-25 NOTE — ED Notes (Addendum)
Pt transported from home with c/o nausea after eating pizza left over from last night and drinking 6 pack of beer Pt states she at pizza last night, vomited one time, ate the pizza again this morning after it had sat out all night, vomited once again this am. Pt also consumed several beers today. Pt c/o HA, "gas bubble in back". Pt states she hates [facility] and wants to go [facility].

## 2014-01-25 NOTE — ED Notes (Signed)
Bed: DD22 Expected date: 01/25/14 Expected time: 7:17 PM Means of arrival: Ambulance Comments: N/V

## 2014-01-25 NOTE — Discharge Instructions (Signed)

## 2014-03-25 DIAGNOSIS — H1045 Other chronic allergic conjunctivitis: Secondary | ICD-10-CM | POA: Diagnosis not present

## 2014-03-25 DIAGNOSIS — H01139 Eczematous dermatitis of unspecified eye, unspecified eyelid: Secondary | ICD-10-CM | POA: Diagnosis not present

## 2014-03-25 DIAGNOSIS — Z961 Presence of intraocular lens: Secondary | ICD-10-CM | POA: Diagnosis not present

## 2014-03-25 DIAGNOSIS — H04129 Dry eye syndrome of unspecified lacrimal gland: Secondary | ICD-10-CM | POA: Diagnosis not present

## 2014-03-25 DIAGNOSIS — H2510 Age-related nuclear cataract, unspecified eye: Secondary | ICD-10-CM | POA: Diagnosis not present

## 2014-04-22 ENCOUNTER — Other Ambulatory Visit: Payer: Self-pay | Admitting: Internal Medicine

## 2014-04-22 NOTE — Telephone Encounter (Signed)
Patient's last clinic visit here was 2 years ago.  Please call her and find out if she has established with another physician.  If not, she needs to be seen ASAP.

## 2014-04-22 NOTE — Telephone Encounter (Signed)
States she is still a pt here at [facility]; instructed pt to call back in the morning to schedule an appt.; she agreed.

## 2014-04-22 NOTE — Telephone Encounter (Signed)
Pt was called several times today; no answer - messages left. No response at this time from pt.

## 2014-04-23 NOTE — Telephone Encounter (Signed)
When she sch an appt, I will fill enough med to get her to ger appt

## 2014-04-23 NOTE — Telephone Encounter (Signed)
Pt has an appt 6/29 w/Dr Rabbani.

## 2014-04-28 ENCOUNTER — Encounter: Payer: Self-pay | Admitting: Internal Medicine

## 2014-04-28 ENCOUNTER — Ambulatory Visit (INDEPENDENT_AMBULATORY_CARE_PROVIDER_SITE_OTHER): Payer: Medicare Other | Admitting: Internal Medicine

## 2014-04-28 VITALS — BP 137/93 | HR 64 | Temp 97.4°F | Ht 63.0 in | Wt 260.9 lb

## 2014-04-28 DIAGNOSIS — Z0000 Encounter for general adult medical examination without abnormal findings: Secondary | ICD-10-CM

## 2014-04-28 DIAGNOSIS — E876 Hypokalemia: Secondary | ICD-10-CM | POA: Diagnosis not present

## 2014-04-28 DIAGNOSIS — F1020 Alcohol dependence, uncomplicated: Secondary | ICD-10-CM

## 2014-04-28 DIAGNOSIS — E119 Type 2 diabetes mellitus without complications: Secondary | ICD-10-CM | POA: Diagnosis not present

## 2014-04-28 DIAGNOSIS — E785 Hyperlipidemia, unspecified: Secondary | ICD-10-CM

## 2014-04-28 DIAGNOSIS — I10 Essential (primary) hypertension: Secondary | ICD-10-CM

## 2014-04-28 DIAGNOSIS — F341 Dysthymic disorder: Secondary | ICD-10-CM

## 2014-04-28 LAB — POCT GLYCOSYLATED HEMOGLOBIN (HGB A1C): Hemoglobin A1C: 6.7

## 2014-04-28 LAB — GLUCOSE, CAPILLARY: Glucose-Capillary: 113 mg/dL — ABNORMAL HIGH (ref 70–99)

## 2014-04-28 MED ORDER — OLMESARTAN MEDOXOMIL 20 MG PO TABS: 20.0000 mg | ORAL_TABLET | Freq: Every morning | ORAL | Status: DC

## 2014-04-28 NOTE — Patient Instructions (Addendum)
-  Start retaking olmesartan today, if you have ear ringing please call us -Will check your blood work today -Will give you stools cards to return to [place] -Please call us when you want to have a mammogram -Will see you back in 6 months   Tinnitus Sounds you hear in your ears and coming from within the ear is called tinnitus. This can be a symptom of many ear disorders. It is often associated with hearing loss.  Tinnitus can be seen with:  Infections.  Ear blockages such as wax buildup.  Meniere's disease.  Ear damage.  Inherited.  Occupational causes. While irritating, it is not usually a threat to health. When the cause of the tinnitus is wax, infection in the middle ear, or foreign body it is easily treated. Hearing loss will usually be reversible.  TREATMENT  When treating the underlying cause does not get rid of tinnitus, it may be necessary to get rid of the unwanted sound by covering it up with more pleasant background noises. This may include music, the radio etc. There are tinnitus maskers which can be worn which produce background noise to cover up the tinnitus. Avoid all medications which tend to make tinnitus worse such as alcohol, caffeine, aspirin, and nicotine. There are many soothing background tapes such as rain, ocean, thunderstorms, etc. These soothing sounds help with sleeping or resting. Keep all follow-up appointments and referrals. This is important to identify the cause of the problem. It also helps avoid complications, impaired hearing, disability, or chronic pain. Document Released: 10/17/2005 Document Revised: 01/09/2012 Document Reviewed: 06/04/2008 [facility] Patient Information 2015 [place], [place]. This information is not intended to replace advice given to you by your health care provider. Make sure you discuss any questions you have with your health care provider.   General Instructions:   Please try to bring all your medicines next time. This will help [place]  keep you safe from mistakes.   Progress Toward Treatment Goals:  No flowsheet data found.  Self Care Goals & Plans:  Self Care Goal 04/28/2014  Manage my medications take my medicines as prescribed; bring my medications to every visit; refill my medications on time  Monitor my health keep track of my blood glucose; check my feet daily  Eat healthy foods eat more vegetables; eat foods that are low in salt; eat baked foods instead of fried foods  Be physically active find an activity I enjoy    No flowsheet data found.   Care Management & Community Referrals:  No flowsheet data found.

## 2014-04-28 NOTE — Progress Notes (Addendum)
Patient ID: Allison Caldwell, female   DOB: 1946-08-26, 68 y.o.   MRN: [id number]    Subjective:   Patient ID: Allison Caldwell female   DOB: Oct 23, 1946 68 y.o.   MRN: [id number]  HPI: Allison Caldwell is a 68 y.o. woman with past medical history of hypertension, non-insulin Type II DM, depression, GERD, and alcohol use who presents for routine follow-up visit.   Her last A1c was 6.4 on 04/10/12. She reports compliance with metformin 500 mg BID and glipizide 5 mg daily. She does not monitor her blood sugar at home. She denies symptomatic hypoglycemia. She has chronic polyuria (at night), polyphagia, and blurry vision, but denies polydipsia, neuropathy, or foot injury/ullcers. She has an upcoming eye appointment next month. She reports her diet is unhealthy and walks regularly with no recent weight changes.   She reports compliance with taking carvedilol and matzim LA daily but has not been taking olmesartan for the past 1-2 months. She denies headache, palpitations, chest pain, LE swelling, or lightheadedness.   She reports compliance with taking pravastatin daily with no muscle cramping or pain.   She reports compliance with taking zoloft daily and reports her mood is stable with no fatigue, insomnia, difficulty concentrating, or loss of interest in activities.    She no longer smokes cigarettes and reports quitting 2 years ago. She denies chronic cough, dyspnea, or wheezing. She still drinks alcohol but unable to specify the amount.       Past Medical History  Diagnosis Date  . Hypertension   . Diabetes mellitus     NIDDM  . Obesities, morbid   . TIA (transient ischemic attack)   . Depression   . Degenerative joint disease   . ETOH abuse   . Cardiomyopathy   . Anxiety    Current Outpatient Prescriptions  Medication Sig Dispense Refill  . aspirin EC 81 MG tablet Take 81 mg by mouth every morning.       . carvedilol (COREG) 25 MG tablet Take 25 mg by mouth 2 (two)  times daily with a meal.      . glipiZIDE (GLUCOTROL) 5 MG tablet Take 5 mg by mouth every morning.       Marland Kitchen MATZIM LA 420 MG 24 hr tablet TAKE ONE TABLET BY MOUTH ONCE DAILY  90 tablet  0  . metFORMIN (GLUCOPHAGE) 500 MG tablet Take 500 mg by mouth 2 (two) times daily with a meal.      . Multiple Vitamin (MULITIVITAMIN WITH MINERALS) TABS Take 1 tablet by mouth daily.      Marland Kitchen olmesartan (BENICAR) 20 MG tablet Take 1 tablet (20 mg total) by mouth every morning.  30 tablet  5  . omeprazole (PRILOSEC) 20 MG capsule Take 20 mg by mouth every evening.      . ondansetron (ZOFRAN) 4 MG tablet Take 1 tablet (4 mg total) by mouth every 6 (six) hours.  12 tablet  0  . ondansetron (ZOFRAN) 8 MG tablet Take 1 tablet (8 mg total) by mouth every 8 (eight) hours as needed for nausea.  12 tablet  0  . pravastatin (PRAVACHOL) 20 MG tablet Take 1 tablet (20 mg total) by mouth at bedtime.  90 tablet  3  . sertraline (ZOLOFT) 100 MG tablet Take 100 mg by mouth every morning.        No current facility-administered medications for this visit.   Family History  Problem Relation Age of Onset  . Heart disease Mother   .  Cancer Father   . Diabetes Sister   . Kidney disease Brother   . Stroke Brother    History   Social History  . Marital Status: Divorced    Spouse Name: N/A    Number of Children: N/A  . Years of Education: N/A   Occupational History  . House keeping-retired since 2006    Social History Main Topics  . Smoking status: Current Every Day Smoker -- 0.50 packs/day    Types: Cigarettes  . Smokeless tobacco: Not on file     Comment: used to smoke 1/2 pk aweek; quit 6/11.  Marland Kitchen Alcohol Use: 1.8 oz/week    3 Cans of beer per week     Comment: intoxicated at this time  . Drug Use: No     Comment: THC occasionally.  . Sexual Activity: Yes   Other Topics Concern  . Not on file   Social History Narrative   Lives in [place].   Review of Systems: Review of Systems  Constitutional:  Negative for fever, chills, weight loss and malaise/fatigue.  HENT: Positive for tinnitus (chronic for years). Negative for congestion and sore throat.   Eyes: Positive for blurred vision (chronic at baseline).  Respiratory: Negative for cough, shortness of breath and wheezing.   Cardiovascular: Negative for chest pain, palpitations and leg swelling.  Gastrointestinal: Positive for heartburn (controlled on PPI ). Negative for nausea, vomiting, abdominal pain, diarrhea and constipation.  Genitourinary: Positive for frequency (at night). Negative for dysuria and urgency.  Musculoskeletal: Positive for joint pain (chronic right knee pain at baseline). Negative for myalgias.       Right hip pain for past 2 weeks  Skin: Negative for rash.  Neurological: Negative for dizziness, sensory change, weakness and headaches.  Endo/Heme/Allergies: Negative for polydipsia.  Psychiatric/Behavioral: Negative for depression. The patient does not have insomnia.     Objective:  Physical Exam: Filed Vitals:   04/28/14 1403  BP: 137/93  Pulse: 64  Temp: 97.4 F (36.3 C)  TempSrc: Oral  Height: 5\' 3"  (1.6 m)  Weight: 260 lb 14.4 oz (118.343 kg)  SpO2: 98%     Physical Exam  Constitutional: She is oriented to person, place, and time. She appears well-developed and well-nourished. No distress.  HENT:  Head: Normocephalic and atraumatic.  Right Ear: External ear normal.  Left Ear: External ear normal.  Nose: Nose normal.  Mouth/Throat: Oropharynx is clear and moist. No oropharyngeal exudate.  Eyes: Conjunctivae and EOM are normal. Pupils are equal, round, and reactive to light. Right eye exhibits no discharge. Left eye exhibits no discharge. No scleral icterus.  Neck: Normal range of motion. Neck supple.  Cardiovascular: Normal rate, regular rhythm and normal heart sounds.   Pulmonary/Chest: Effort normal and breath sounds normal. No respiratory distress. She has no wheezes. She has no rales.    Abdominal: Soft. Bowel sounds are normal. She exhibits no distension. There is no tenderness. There is no rebound and no guarding.  Musculoskeletal: Normal range of motion. She exhibits no edema and no tenderness.  Neurological: She is alert and oriented to person, place, and time.  Skin: Skin is warm and dry. No rash noted. She is not diaphoretic. No erythema. No pallor.  burn on right elbow   Psychiatric: She has a normal mood and affect. Her behavior is normal. Judgment and thought content normal.    Assessment & Plan:   Please see problem list for problem-based assessment and plan

## 2014-04-29 DIAGNOSIS — E876 Hypokalemia: Secondary | ICD-10-CM | POA: Insufficient documentation

## 2014-04-29 LAB — MICROALBUMIN / CREATININE URINE RATIO
Creatinine, Urine: 378 mg/dL
Microalb Creat Ratio: 13 mg/g (ref 0.0–30.0)
Microalb, Ur: 4.92 mg/dL — ABNORMAL HIGH (ref 0.00–1.89)

## 2014-04-29 LAB — BASIC METABOLIC PANEL WITH GFR
BUN: 7 mg/dL (ref 6–23)
CO2: 29 mEq/L (ref 19–32)
Calcium: 8.5 mg/dL (ref 8.4–10.5)
Chloride: 100 mEq/L (ref 96–112)
Creat: 0.58 mg/dL (ref 0.50–1.10)
GFR, Est African American: >89 mL/min
GFR, Est Non African American: >89 mL/min
Glucose, Bld: 109 mg/dL — ABNORMAL HIGH (ref 70–99)
Potassium: 3.2 mEq/L — ABNORMAL LOW (ref 3.5–5.3)
Sodium: 142 mEq/L (ref 135–145)

## 2014-04-29 LAB — LIPID PANEL
Cholesterol: 177 mg/dL (ref 0–200)
HDL: 97 mg/dL (ref >39)
LDL Cholesterol: 64 mg/dL (ref 0–99)
Total CHOL/HDL Ratio: 1.8 Ratio
Triglycerides: 79 mg/dL (ref <150)
VLDL: 16 mg/dL (ref 0–40)

## 2014-04-29 LAB — MAGNESIUM: Magnesium: 1.1 mg/dL — ABNORMAL LOW (ref 1.5–2.5)

## 2014-04-29 MED ORDER — MAGNESIUM CHLORIDE ER 535 (64 MG) MG PO TBCR: 1.0000 | EXTENDED_RELEASE_TABLET | Freq: Every day | ORAL | Status: DC

## 2014-04-29 MED ORDER — POTASSIUM CHLORIDE ER 10 MEQ PO TBCR: 10.0000 meq | EXTENDED_RELEASE_TABLET | Freq: Every day | ORAL | Status: DC

## 2014-04-29 NOTE — Assessment & Plan Note (Addendum)
Assessment: Pt with well-controlled non-insulin dependent Type II DM with last A1c 6.4 on 04/10/12 compliant with oral hypoglycemic therapy with no recent symptomatic hypoglycemia who presents with CBG of 113 and A1c of 6.7.   Plan: -A1c 6.7 at goal <7, continue metformin 500 mg BID and glipizide 5 mg daily   -BP 137/93 at goal <140/90, continue carvedilol 25 mg BID, olmesartan 20 mg daily, and matzim LA 420 mg daily    -LDL 64 at goal <100, continue pravastatin 20 mg daily  -Obtain annual urine microalbumin ---> normal  -Perform annual foot exam today -Last annual eye exam on 05/08/12, pt reports scheduled for one next month  -BMI 46.23 not at goal <25, encourage weight loss -Continue aspirin 81 mg daily for primary CVD

## 2014-04-29 NOTE — Assessment & Plan Note (Signed)
Assessment: Pt with last lipid panel on 01/10/12 with hypercholesteremia compliant with low-intensity statin therapy with 10-yr ASCVD risk of 33.9% with recommendations of moderate intensity statin.     Plan:  -Obtain annual lipid panel  -Continue pravastatin 20 mg daily consider increase to 40 mg daily  -Last CMP on 01/25/14 -Monitor for myalgias

## 2014-04-29 NOTE — Assessment & Plan Note (Addendum)
Assessment: Pt with history of alcohol abuse who is still currently drinking unclear amount of alcohol and not motivated to quit.  Plan: -Encourage cessation

## 2014-04-29 NOTE — Assessment & Plan Note (Addendum)
-  Pt declines screening colonoscopy but agreeable to stool card testing -Pt declines mammography -Pt declines Tdap, zoster, and pneumococcal vaccinations at this time

## 2014-04-29 NOTE — Assessment & Plan Note (Addendum)
Assessment: Pt with history of hypomagnesemia in 2012 not on diuretic therapy most likely due to chronic alcohol use.     Plan:  -Obtain Mg level ---> 1.1 -Pt called and instructed to take magnesium chloride 64 mg daily for 5 days and to have repeat blood work in 1-2 weeks

## 2014-04-29 NOTE — Assessment & Plan Note (Addendum)
Assessment:  Pt with well-controlled depression compliant with SSRI therapy who presents with stable mood.   Plan:  -Continue sertraline 100 mg daily  -Continue to monitor mood

## 2014-04-29 NOTE — Assessment & Plan Note (Addendum)
Assessment: Pt with chronic hypokalemia since 02/2013 not on replacement therapy most likely due to concomitant hypomagnesemia.   Plan:  -Obtain BMP --> 3.2 -Obtain Mg level ---> 1.1 -Pt called and instructed to take potassium chloride 10 mEq daily and magnesium chloride 64 mg daily for 5 days and to have repeat blood work in 1-2 weeks

## 2014-04-29 NOTE — Assessment & Plan Note (Addendum)
Assessment: Pt with moderately well-controlled hypertension compliant with BB and CCB but not ARB anti-hypertensive therapy who presents with blood pressure of 137/93.    Plan: -BP 137/93 at goal <140/90 -Continue carvedilol 25 mg BID  -Continue olmesartan 20 mg daily  -Continue matzim LA 420 mg daily    -Obtain BMP

## 2014-04-30 NOTE — Progress Notes (Signed)
Case discussed with Dr. Rabbani at the time of the visit.  We reviewed the resident's history and exam and pertinent patient test results.  I agree with the assessment, diagnosis, and plan of care documented in the resident's note. 

## 2014-05-01 ENCOUNTER — Telehealth: Payer: Self-pay | Admitting: *Deleted

## 2014-05-01 MED ORDER — DILTIAZEM HCL ER BEADS 420 MG PO CP24: 420.0000 mg | ORAL_CAPSULE | Freq: Every day | ORAL | Status: DC

## 2014-05-01 NOTE — Telephone Encounter (Signed)
Change made and sent to [facility]

## 2014-05-01 NOTE — Telephone Encounter (Signed)
Received PA request on from pt's pharmacy-Matzim LA 420mg  tabs is non-preferred.  Diltiazem hcl er 420mg  tabs are preferred as a Tier medication.  Will forward info to attending for review.Despina Hidden Cassady7/2/20154:31 PM

## 2014-05-10 ENCOUNTER — Other Ambulatory Visit: Payer: Self-pay | Admitting: Internal Medicine

## 2014-05-12 NOTE — Telephone Encounter (Signed)
Dr. Lynnae January changed patient from [facility] LA to diltiazem ER (Tiazac) due to formulary and sent prescription to pharmacy.  Please confirm that they received the prescription.

## 2014-05-12 NOTE — Telephone Encounter (Signed)
[facility] pharmacist stated they did received rx for Tiazac.

## 2014-06-10 ENCOUNTER — Telehealth: Payer: Self-pay | Admitting: Internal Medicine

## 2014-06-10 NOTE — Telephone Encounter (Signed)
Called Dr. Zenia Resides office and this patient's last appointment was 03/25/2014.  Office visit notes are being faxed.

## 2014-07-14 ENCOUNTER — Other Ambulatory Visit: Payer: Self-pay | Admitting: Internal Medicine

## 2014-08-07 ENCOUNTER — Emergency Department (HOSPITAL_COMMUNITY): Payer: Medicare Other

## 2014-08-07 ENCOUNTER — Emergency Department (HOSPITAL_COMMUNITY)
Admission: EM | Admit: 2014-08-07 | Discharge: 2014-08-07 | Disposition: A | Discharge status: Home / Self Care | Payer: Medicare Other | Attending: Emergency Medicine | Admitting: Emergency Medicine

## 2014-08-07 ENCOUNTER — Encounter (HOSPITAL_COMMUNITY): Payer: Self-pay | Admitting: Emergency Medicine

## 2014-08-07 DIAGNOSIS — F10929 Alcohol use, unspecified with intoxication, unspecified: Secondary | ICD-10-CM

## 2014-08-07 DIAGNOSIS — Y939 Activity, unspecified: Secondary | ICD-10-CM | POA: Insufficient documentation

## 2014-08-07 DIAGNOSIS — W1839XA Other fall on same level, initial encounter: Secondary | ICD-10-CM | POA: Insufficient documentation

## 2014-08-07 DIAGNOSIS — F10129 Alcohol abuse with intoxication, unspecified: Secondary | ICD-10-CM | POA: Diagnosis present

## 2014-08-07 DIAGNOSIS — F419 Anxiety disorder, unspecified: Secondary | ICD-10-CM | POA: Insufficient documentation

## 2014-08-07 DIAGNOSIS — R51 Headache: Secondary | ICD-10-CM | POA: Insufficient documentation

## 2014-08-07 DIAGNOSIS — M1990 Unspecified osteoarthritis, unspecified site: Secondary | ICD-10-CM | POA: Insufficient documentation

## 2014-08-07 DIAGNOSIS — Z7982 Long term (current) use of aspirin: Secondary | ICD-10-CM | POA: Diagnosis not present

## 2014-08-07 DIAGNOSIS — S62611A Displaced fracture of proximal phalanx of left index finger, initial encounter for closed fracture: Secondary | ICD-10-CM | POA: Diagnosis not present

## 2014-08-07 DIAGNOSIS — F329 Major depressive disorder, single episode, unspecified: Secondary | ICD-10-CM | POA: Insufficient documentation

## 2014-08-07 DIAGNOSIS — S3991XA Unspecified injury of abdomen, initial encounter: Secondary | ICD-10-CM | POA: Diagnosis not present

## 2014-08-07 DIAGNOSIS — M79646 Pain in unspecified finger(s): Secondary | ICD-10-CM | POA: Diagnosis not present

## 2014-08-07 DIAGNOSIS — Z8673 Personal history of transient ischemic attack (TIA), and cerebral infarction without residual deficits: Secondary | ICD-10-CM | POA: Insufficient documentation

## 2014-08-07 DIAGNOSIS — I10 Essential (primary) hypertension: Secondary | ICD-10-CM | POA: Insufficient documentation

## 2014-08-07 DIAGNOSIS — Z880 Allergy status to penicillin: Secondary | ICD-10-CM | POA: Diagnosis not present

## 2014-08-07 DIAGNOSIS — S62609A Fracture of unspecified phalanx of unspecified finger, initial encounter for closed fracture: Secondary | ICD-10-CM

## 2014-08-07 DIAGNOSIS — S0083XA Contusion of other part of head, initial encounter: Secondary | ICD-10-CM | POA: Insufficient documentation

## 2014-08-07 DIAGNOSIS — Z79899 Other long term (current) drug therapy: Secondary | ICD-10-CM | POA: Insufficient documentation

## 2014-08-07 DIAGNOSIS — S62617A Displaced fracture of proximal phalanx of left little finger, initial encounter for closed fracture: Secondary | ICD-10-CM | POA: Diagnosis not present

## 2014-08-07 DIAGNOSIS — Z87891 Personal history of nicotine dependence: Secondary | ICD-10-CM | POA: Diagnosis not present

## 2014-08-07 DIAGNOSIS — R6889 Other general symptoms and signs: Secondary | ICD-10-CM | POA: Diagnosis not present

## 2014-08-07 DIAGNOSIS — W19XXXA Unspecified fall, initial encounter: Secondary | ICD-10-CM

## 2014-08-07 DIAGNOSIS — E119 Type 2 diabetes mellitus without complications: Secondary | ICD-10-CM | POA: Diagnosis not present

## 2014-08-07 DIAGNOSIS — S0093XA Contusion of unspecified part of head, initial encounter: Secondary | ICD-10-CM | POA: Diagnosis not present

## 2014-08-07 DIAGNOSIS — Y929 Unspecified place or not applicable: Secondary | ICD-10-CM | POA: Insufficient documentation

## 2014-08-07 LAB — COMPREHENSIVE METABOLIC PANEL
ALT: 12 U/L (ref 0–35)
AST: 19 U/L (ref 0–37)
Albumin: 3.8 g/dL (ref 3.5–5.2)
Alkaline Phosphatase: 48 U/L (ref 39–117)
Anion gap: 18 — ABNORMAL HIGH (ref 5–15)
BUN: 6 mg/dL (ref 6–23)
CO2: 25 mEq/L (ref 19–32)
Calcium: 8.7 mg/dL (ref 8.4–10.5)
Chloride: 100 mEq/L (ref 96–112)
Creatinine, Ser: 0.47 mg/dL — ABNORMAL LOW (ref 0.50–1.10)
GFR calc Af Amer: >90 mL/min (ref >90)
GFR calc non Af Amer: >90 mL/min (ref >90)
Glucose, Bld: 123 mg/dL — ABNORMAL HIGH (ref 70–99)
Potassium: 4 mEq/L (ref 3.7–5.3)
Sodium: 143 mEq/L (ref 137–147)
Total Bilirubin: 0.3 mg/dL (ref 0.3–1.2)
Total Protein: 7.3 g/dL (ref 6.0–8.3)

## 2014-08-07 LAB — CBC
HCT: 37.7 % (ref 36.0–46.0)
Hemoglobin: 13.1 g/dL (ref 12.0–15.0)
MCH: 31.5 pg (ref 26.0–34.0)
MCHC: 34.7 g/dL (ref 30.0–36.0)
MCV: 90.6 fL (ref 78.0–100.0)
Platelets: 219 10*3/uL (ref 150–400)
RBC: 4.16 MIL/uL (ref 3.87–5.11)
RDW: 14.6 % (ref 11.5–15.5)
WBC: 5.7 10*3/uL (ref 4.0–10.5)

## 2014-08-07 LAB — PROTIME-INR
INR: 0.99 (ref 0.00–1.49)
Prothrombin Time: 13.1 seconds (ref 11.6–15.2)

## 2014-08-07 LAB — LIPASE, BLOOD: Lipase: 50 U/L (ref 11–59)

## 2014-08-07 LAB — ETHANOL: Alcohol, Ethyl (B): 216 mg/dL — ABNORMAL HIGH (ref 0–11)

## 2014-08-07 LAB — CBG MONITORING, ED: Glucose-Capillary: 119 mg/dL — ABNORMAL HIGH (ref 70–99)

## 2014-08-07 MED ORDER — SODIUM CHLORIDE 0.9 % IV BOLUS (SEPSIS)
1000.0000 mL | Freq: Once | INTRAVENOUS | Status: AC
  Administered 2014-08-07: 1000 mL via INTRAVENOUS

## 2014-08-07 MED ORDER — GI COCKTAIL ~~LOC~~
30.0000 mL | Freq: Once | ORAL | Status: AC
  Administered 2014-08-07: 30 mL via ORAL
  Filled 2014-08-07: qty 30

## 2014-08-07 NOTE — ED Notes (Signed)
Patient reports she is on aspirin daily, and fell and hit her head 2 days ago.

## 2014-08-07 NOTE — ED Notes (Signed)
Pt getting in paper scrubs for discharge

## 2014-08-07 NOTE — ED Notes (Signed)
Per ems-- pt fell on Friday d/t being intoxicated. Pt c/o left pinky pain, small hematoma to forehead, neck pain, epigastric pain, and buttox pain. Pt has been drinking today. Pt conscious a&ox4.

## 2014-08-07 NOTE — Progress Notes (Signed)
Orthopedic Tech Progress Note Patient Details:  Allison Caldwell 01/22/1946 [id number]  Ortho Devices Type of Ortho Device: Ace wrap;Ulna gutter splint Ortho Device/Splint Interventions: Application   Cammer, Theodoro Parma 08/07/2014, 10:29 AM

## 2014-08-07 NOTE — ED Notes (Signed)
Pt ambulated to door or room with assistance and cane, pt needed to sit down twice, pt became SOB and had multiple complaints about R knee pain and back pain. Pt states she does not know how she is going to get home or how she will get up and stairs at home, or get around.

## 2014-08-07 NOTE — ED Provider Notes (Addendum)
CSN: [id number]     Arrival date & time 08/07/14  0040 History   First MD Initiated Contact with Patient 08/07/14 0550     Chief Complaint  Patient presents with  . Fall  . Alcohol Problem     (Consider location/radiation/quality/duration/timing/severity/associated sxs/prior Treatment) Patient is a 68 y.o. female presenting with head injury.  Head Injury Location:  Frontal Time since incident:  5 days Mechanism of injury: fall   Pain details:    Quality:  Dull   Severity:  Moderate   Duration:  5 days   Timing:  Constant   Progression:  Unchanged Chronicity:  New Relieved by:  Nothing Associated symptoms: nausea   Associated symptoms: no loss of consciousness, no neck pain and no vomiting     Past Medical History  Diagnosis Date  . Hypertension   . Diabetes mellitus     NIDDM  . Obesities, morbid   . TIA (transient ischemic attack)   . Depression   . Degenerative joint disease   . ETOH abuse   . Cardiomyopathy   . Anxiety    Past Surgical History  Procedure Laterality Date  . Orif ankle fracture      right  . Cesarean section      times 2  . Eye surgery      laser - right eye 2010  . Left wrist      surgical repair   Family History  Problem Relation Age of Onset  . Heart disease Mother   . Cancer Father   . Diabetes Sister   . Kidney disease Brother   . Stroke Brother    History  Substance Use Topics  . Smoking status: Former Smoker    Types: Cigarettes  . Smokeless tobacco: Not on file     Comment: Quit x 2 yrrs.  . Alcohol Use: 1.8 oz/week    3 Cans of beer per week     Comment: beer/vodka.   OB History   Grav Para Term Preterm Abortions TAB SAB Ect Mult Living                 Review of Systems  Gastrointestinal: Positive for nausea and abdominal pain (epigastric). Negative for vomiting.  Musculoskeletal: Negative for neck pain.  Neurological: Negative for loss of consciousness.  All other systems reviewed and are  negative.     Allergies  Ace inhibitors and Penicillins  Home Medications   Prior to Admission medications   Medication Sig Start Date End Date Taking? Authorizing Provider  aspirin EC 81 MG tablet Take 81 mg by mouth every morning.    Yes Historical Provider, MD  carvedilol (COREG) 25 MG tablet Take 25 mg by mouth 2 (two) times daily with a meal.   Yes Historical Provider, MD  diltiazem (TIAZAC) 420 MG 24 hr capsule Take 1 capsule (420 mg total) by mouth daily. 05/01/14  Yes Bartholomew Crews, MD  glipiZIDE (GLUCOTROL) 5 MG tablet Take 5 mg by mouth every morning.    Yes Historical Provider, MD  metFORMIN (GLUCOPHAGE) 500 MG tablet Take 500 mg by mouth 2 (two) times daily with a meal.   Yes Historical Provider, MD  Multiple Vitamin (MULITIVITAMIN WITH MINERALS) TABS Take 1 tablet by mouth daily. 11/30/11  Yes Clarene Duke, MD  olmesartan (BENICAR) 20 MG tablet Take 1 tablet (20 mg total) by mouth every morning. 04/28/14  Yes Marjan Rabbani, MD  pravastatin (PRAVACHOL) 20 MG tablet Take 1 tablet (20 mg  total) by mouth at bedtime. 06/11/13  Yes Ivor Costa, MD  sertraline (ZOLOFT) 100 MG tablet Take 100 mg by mouth every morning.    Yes Historical Provider, MD   BP 153/90  Pulse 108  Temp(Src) 99.4 F (37.4 C) (Oral)  Resp 11  SpO2 95% Physical Exam  Nursing note and vitals reviewed. Constitutional: She is oriented to person, place, and time. She appears well-developed and well-nourished. No distress.  HENT:  Head: Normocephalic. Head is with contusion.  Mouth/Throat: Oropharynx is clear and moist.  Eyes: Conjunctivae are normal. Pupils are equal, round, and reactive to light. No scleral icterus.  Neck: Neck supple.  Cardiovascular: Normal rate, regular rhythm, normal heart sounds and intact distal pulses.   No murmur heard. Pulmonary/Chest: Effort normal and breath sounds normal. No stridor. No respiratory distress. She has no rales.  Abdominal: Soft. Bowel sounds are normal.  She exhibits no distension. There is tenderness in the epigastric area. There is no rigidity, no rebound and no guarding.  Musculoskeletal: Normal range of motion.       Left hand: She exhibits tenderness and swelling (ecchymosis). She exhibits no deformity. Normal sensation noted. Normal strength noted.  Neurological: She is alert and oriented to person, place, and time.  Skin: Skin is warm and dry. No rash noted.  Psychiatric: She has a normal mood and affect. Her behavior is normal.    ED Course  Procedures (including critical care time) Labs Review Labs Reviewed  COMPREHENSIVE METABOLIC PANEL - Abnormal; Notable for the following:    Glucose, Bld 123 (*)    Creatinine, Ser 0.47 (*)    Anion gap 18 (*)    All other components within normal limits  ETHANOL - Abnormal; Notable for the following:    Alcohol, Ethyl (B) 216 (*)    All other components within normal limits  CBG MONITORING, ED - Abnormal; Notable for the following:    Glucose-Capillary 119 (*)    All other components within normal limits  CBC  PROTIME-INR    Imaging Review Ct Head Wo Contrast  08/07/2014   CLINICAL DATA:  Status post fall 6 days ago, with small scalp hematoma at the forehead. Initial encounter.  EXAM: CT HEAD WITHOUT CONTRAST  TECHNIQUE: Contiguous axial images were obtained from the base of the skull through the vertex without intravenous contrast.  COMPARISON:  CT of the head performed 11/20/2011, and MRI/MRA of the brain from 09/13/2004  FINDINGS: There is no evidence of acute infarction, mass lesion, or intra- or extra-axial hemorrhage on CT.  Prominence of the ventricles and sulci suggests mild cortical volume loss. Cerebellar atrophy is noted. Scattered periventricular white matter change likely reflects small vessel ischemic microangiopathy.  The brainstem and fourth ventricle are within normal limits. The basal ganglia are unremarkable in appearance. The cerebral hemispheres demonstrate grossly  normal gray-white differentiation. No mass effect or midline shift is seen.  There is no evidence of fracture; visualized osseous structures are unremarkable in appearance. The orbits are within normal limits. There is partial opacification of the right ethmoid air cells. The paranasal sinuses and mastoid air cells are well-aerated. Minimal soft tissue injury is suggested overlying the left frontal calvarium.  IMPRESSION: 1. No evidence of traumatic intracranial injury or fracture. 2. Minimal soft tissue injury suggested overlying the left frontal calvarium. 3. Mild cortical volume loss and scattered small vessel ischemic microangiopathy.   Electronically Signed   By: Garald Balding M.D.   On: 08/07/2014 05:42  EKG Interpretation None      MDM   Final diagnoses:  Acute alcohol intoxication, with unspecified complication  Finger fracture, left, closed, initial encounter  Fall, initial encounter    Found out her children's father was in the hospital.  She got upset, which somehow led her to come to the ED today.  She complains of headache and epigastric pain.  She has some subacute appearing injuries, but her most notable finding today is her intoxication.  Will rehydrate and monitor.    Labs show EtOH of over 200.  She was advised on alcohol cessation.  CT of her head was negative for traumatic injuries.  She was found to have a left finger fracture and was placed in a splint.  After monitoring in the ED for several hours she was found to be sober, was tolerating PO intake.  I observed her walking with her usual assistance.  She has family that have been coming by to check on her.  She was felt to be stable for discharge home with close outpatient follow up.    Artis Delay, MD 08/08/14 1818  Artis Delay, MD 08/27/14 [phone]

## 2014-08-07 NOTE — ED Notes (Signed)
Pt ambulated with walker and 2 staff assist.  Then, she ambulated on her own with a cane.  MD notified.

## 2014-08-07 NOTE — Discharge Instructions (Signed)
Alcohol Intoxication Alcohol intoxication occurs when the amount of alcohol that a person has consumed impairs his or her ability to mentally and physically function. Alcohol directly impairs the normal chemical activity of the brain. Drinking large amounts of alcohol can lead to changes in mental function and behavior, and it can cause many physical effects that can be harmful.  Alcohol intoxication can range in severity from mild to very severe. Various factors can affect the level of intoxication that occurs, such as the person's age, gender, weight, frequency of alcohol consumption, and the presence of other medical conditions (such as diabetes, seizures, or heart conditions). Dangerous levels of alcohol intoxication may occur when people drink large amounts of alcohol in a short period (binge drinking). Alcohol can also be especially dangerous when combined with certain prescription medicines or "recreational" drugs. SIGNS AND SYMPTOMS Some common signs and symptoms of mild alcohol intoxication include:  Loss of coordination.  Changes in mood and behavior.  Impaired judgment.  Slurred speech. As alcohol intoxication progresses to more severe levels, other signs and symptoms will appear. These may include:  Vomiting.  Confusion and impaired memory.  Slowed breathing.  Seizures.  Loss of consciousness. DIAGNOSIS  Your health care provider will take a medical history and perform a physical exam. You will be asked about the amount and type of alcohol you have consumed. Blood tests will be done to measure the concentration of alcohol in your blood. In many places, your blood alcohol level must be lower than 80 mg/dL (0.08%) to legally drive. However, many dangerous effects of alcohol can occur at much lower levels.  TREATMENT  People with alcohol intoxication often do not require treatment. Most of the effects of alcohol intoxication are temporary, and they go away as the alcohol naturally  leaves the body. Your health care provider will monitor your condition until you are stable enough to go home. Fluids are sometimes given through an IV access tube to help prevent dehydration.  HOME CARE INSTRUCTIONS  Do not drive after drinking alcohol.  Stay hydrated. Drink enough water and fluids to keep your urine clear or pale yellow. Avoid caffeine.   Only take over-the-counter or prescription medicines as directed by your health care provider.  SEEK MEDICAL CARE IF:   You have persistent vomiting.   You do not feel better after a few days.  You have frequent alcohol intoxication. Your health care provider can help determine if you should see a substance use treatment counselor. SEEK IMMEDIATE MEDICAL CARE IF:   You become shaky or tremble when you try to stop drinking.   You shake uncontrollably (seizure).   You throw up (vomit) blood. This may be bright red or may look like black coffee grounds.   You have blood in your stool. This may be bright red or may appear as a black, tarry, bad smelling stool.   You become lightheaded or faint.  MAKE SURE YOU:   Understand these instructions.  Will watch your condition.  Will get help right away if you are not doing well or get worse. Document Released: 07/27/2005 Document Revised: 06/19/2013 Document Reviewed: 03/22/2013 [facility] Patient Information 2015 [place], [place]. This information is not intended to replace advice given to you by your health care provider. Make sure you discuss any questions you have with your health care provider.  Finger Fracture Fractures of fingers are breaks in the bones of the fingers. There are many types of fractures. There are different ways of treating these  fractures. Your health care provider will discuss the best way to treat your fracture. CAUSES Traumatic injury is the main cause of broken fingers. These include:  Injuries while playing sports.  Workplace  injuries.  Falls. RISK FACTORS Activities that can increase your risk of finger fractures include:  Sports.  Workplace activities that involve machinery.  A condition called osteoporosis, which can make your bones less dense and cause them to fracture more easily. SIGNS AND SYMPTOMS The main symptoms of a broken finger are pain and swelling within 15 minutes after the injury. Other symptoms include:  Bruising of your finger.  Stiffness of your finger.  Numbness of your finger.  Exposed bones (compound fracture) if the fracture is severe. DIAGNOSIS  The best way to diagnose a broken bone is with X-ray imaging. Additionally, your health care provider will use this X-ray image to evaluate the position of the broken finger bones.  TREATMENT  Finger fractures can be treated with:   Nonreduction--This means the bones are in place. The finger is splinted without changing the positions of the bone pieces. The splint is usually left on for about a week to 10 days. This will depend on your fracture and what your health care provider thinks.  Closed reduction--The bones are put back into position without using surgery. The finger is then splinted.  Open reduction and internal fixation--The fracture site is opened. Then the bone pieces are fixed into place with pins or some type of hardware. This is seldom required. It depends on the severity of the fracture. HOME CARE INSTRUCTIONS   Follow your health care provider's instructions regarding activities, exercises, and physical therapy.  Only take over-the-counter or prescription medicines for pain, discomfort, or fever as directed by your health care provider. SEEK MEDICAL CARE IF: You have pain or swelling that limits the motion or use of your fingers. SEEK IMMEDIATE MEDICAL CARE IF:  Your finger becomes numb. MAKE SURE YOU:   Understand these instructions.  Will watch your condition.  Will get help right away if you are not doing  well or get worse. Document Released: 01/29/2001 Document Revised: 08/07/2013 Document Reviewed: 05/29/2013 [facility] Patient Information 2015 [place], [place]. This information is not intended to replace advice given to you by your health care provider. Make sure you discuss any questions you have with your health care provider.

## 2014-08-15 ENCOUNTER — Ambulatory Visit (INDEPENDENT_AMBULATORY_CARE_PROVIDER_SITE_OTHER): Payer: Medicare Other | Admitting: Internal Medicine

## 2014-08-15 ENCOUNTER — Encounter: Payer: Self-pay | Admitting: Internal Medicine

## 2014-08-15 VITALS — BP 131/83 | HR 65 | Temp 98.2°F | Ht 62.0 in | Wt 257.6 lb

## 2014-08-15 DIAGNOSIS — IMO0002 Reserved for concepts with insufficient information to code with codable children: Secondary | ICD-10-CM

## 2014-08-15 DIAGNOSIS — E1165 Type 2 diabetes mellitus with hyperglycemia: Secondary | ICD-10-CM

## 2014-08-15 DIAGNOSIS — F341 Dysthymic disorder: Secondary | ICD-10-CM

## 2014-08-15 DIAGNOSIS — S62617D Displaced fracture of proximal phalanx of left little finger, subsequent encounter for fracture with routine healing: Secondary | ICD-10-CM

## 2014-08-15 DIAGNOSIS — S62617A Displaced fracture of proximal phalanx of left little finger, initial encounter for closed fracture: Secondary | ICD-10-CM | POA: Insufficient documentation

## 2014-08-15 DIAGNOSIS — E785 Hyperlipidemia, unspecified: Secondary | ICD-10-CM

## 2014-08-15 DIAGNOSIS — E119 Type 2 diabetes mellitus without complications: Secondary | ICD-10-CM

## 2014-08-15 DIAGNOSIS — F10220 Alcohol dependence with intoxication, uncomplicated: Secondary | ICD-10-CM

## 2014-08-15 DIAGNOSIS — I10 Essential (primary) hypertension: Secondary | ICD-10-CM

## 2014-08-15 LAB — GLUCOSE, CAPILLARY: Glucose-Capillary: 109 mg/dL — ABNORMAL HIGH (ref 70–99)

## 2014-08-15 LAB — POCT GLYCOSYLATED HEMOGLOBIN (HGB A1C): Hemoglobin A1C: 7

## 2014-08-15 MED ORDER — METFORMIN HCL 500 MG PO TABS: 500.0000 mg | ORAL_TABLET | Freq: Two times a day (BID) | ORAL | Status: DC

## 2014-08-15 MED ORDER — DILTIAZEM HCL ER BEADS 420 MG PO CP24: 420.0000 mg | ORAL_CAPSULE | Freq: Every day | ORAL | Status: DC

## 2014-08-15 MED ORDER — SERTRALINE HCL 100 MG PO TABS: 100.0000 mg | ORAL_TABLET | Freq: Every morning | ORAL | Status: DC

## 2014-08-15 MED ORDER — CARVEDILOL 25 MG PO TABS: 25.0000 mg | ORAL_TABLET | Freq: Two times a day (BID) | ORAL | Status: DC

## 2014-08-15 MED ORDER — PRAVASTATIN SODIUM 20 MG PO TABS: 20.0000 mg | ORAL_TABLET | Freq: Every day | ORAL | Status: DC

## 2014-08-15 MED ORDER — GLIPIZIDE 5 MG PO TABS: 5.0000 mg | ORAL_TABLET | Freq: Every morning | ORAL | Status: DC

## 2014-08-15 NOTE — Progress Notes (Addendum)
Patient ID: Allison Caldwell, female   DOB: June 06, 1946, 68 y.o.   MRN: [id number]   Subjective:   HPI: [name] is a 68 y.o. woman with past medical history of hypertension, non-insulin Type II DM, depression, GERD, and alcohol abuse.   Reason(s) for this visit: ED followup visit. She was evaluated in the ED on 08/07/2014 for fracture of her left fifth proximal phalanx following the fall associated with alcohol intoxication. Hand splint was applied and patient was instructed to followup here. She still has the splint in place. She has not seen an orthopedic surgeon.   ROS: Constitutional: Denies fever, chills, diaphoresis, appetite change and fatigue.  Respiratory: Denies SOB, DOE, cough, chest tightness, and wheezing. Denies chest pain. CVS: No chest pain, palpitations and leg swelling.  GI: No abdominal pain, nausea, vomiting, bloody stools GU: No dysuria, frequency, hematuria, or flank pain.  MSK: No myalgias, back pain, joint swelling, arthralgias  Psych: No depression symptoms. No SI or SA.    Objective:  Physical Exam: Filed Vitals:   08/15/14 1450 08/15/14 1528  BP: 131/83 131/83  Pulse: 72 65  Temp: 98.2 F (36.8 C)   TempSrc: Oral   Height: 5\' 2"  (1.575 m)   Weight: 257 lb 9.6 oz (116.847 kg)   SpO2: 97%    General: Well nourished. No acute distress.  HEENT: Normal oral mucosa. MMM.  Lungs: CTA bilaterally. Heart: RRR; no extra sounds or murmurs  Abdomen: Non-distended, normal bowel sounds, soft, nontender; no hepatosplenomegaly  Extremities: No pedal edema. No joint swelling or tenderness. Left hand with a splint in place. Has good perfusion and movement of the digits. No edema. No increased tenderness. Neurologic: Normal EOM,  Alert and oriented x3. No obvious neurologic/cranial nerve deficits.  Assessment & Plan:  Discussed case with my attending in the clinic, Dr. Lynnae Caldwell.  See problem based charting.     [facility]  Attending  Case discussed with Dr. Alice Caldwell soon after the resident saw the patient.  We reviewed the resident's history and exam and pertinent patient test results.  I agree with the assessment, diagnosis, and plan of care documented in the resident's note.

## 2014-08-15 NOTE — Assessment & Plan Note (Signed)
BP Readings from Last 3 Encounters:  08/15/14 131/83  08/07/14 164/102  04/28/14 137/93    Lab Results  Component Value Date   NA 143 08/07/2014   K 4.0 08/07/2014   CREATININE 0.47* 08/07/2014    Assessment: Blood pressure control: controlled Progress toward BP goal:  at goal Comments: Compliant with treatment.  Plan: Medications:  Diltiazem 420 mg daily. Coreg 25 mg twice a day. Benicar 20 mg every morning. Educational resources provided: Therapist, sports tools provided:   Other plans: Refilled meds. Followup with PCP.

## 2014-08-15 NOTE — Patient Instructions (Signed)
General Instructions: I will make a referral to a hand surgeon  Please continue with your medications  Please see your regular doctor in three months   Please bring your medicines with you each time you come to clinic.  Medicines may include prescription medications, over-the-counter medications, herbal remedies, eye drops, vitamins, or other pills.   Progress Toward Treatment Goals:  Treatment Goal 08/15/2014  Hemoglobin A1C at goal  Blood pressure at goal  Prevent falls at goal    Self Care Goals & Plans:  Self Care Goal 08/15/2014  Manage my medications take my medicines as prescribed; bring my medications to every visit; refill my medications on time; follow the sick day instructions if I am sick  Monitor my health check my feet daily  Eat healthy foods eat more vegetables; eat fruit for snacks and desserts; eat foods that are low in salt; eat baked foods instead of fried foods; eat smaller portions  Be physically active find an activity I enjoy    Home Blood Glucose Monitoring 08/15/2014  Check my blood sugar once a day  When to check my blood sugar before breakfast     Care Management & Community Referrals:  Referral 08/15/2014  Referrals made for care management support none needed

## 2014-08-15 NOTE — Assessment & Plan Note (Signed)
Lab Results  Component Value Date   HGBA1C 7.0 08/15/2014   HGBA1C 6.7 04/28/2014   HGBA1C 6.4 04/10/2012     Assessment: Diabetes control: good control (HgbA1C at goal) Progress toward A1C goal:  at goal Comments: Compliant with meds  Plan: Medications:  continue current medications Home glucose monitoring: Frequency: once a day Timing: before breakfast Instruction/counseling given: discussed the need for weight loss Educational resources provided: brochure;handout Self management tools provided:   Other plans: Followup with PCP.

## 2014-08-15 NOTE — Assessment & Plan Note (Signed)
Discussed with the patient about alcohol abuse. Patient states that she is keeping company with friends who excessively drink over the weekend. Advised the patient to cut connections with such friedns as she has increased risk for falls and other complications of excessive alcohol consumption. She verbalized understanding.

## 2014-08-15 NOTE — Assessment & Plan Note (Signed)
X-ray of the left hand revealed a fracture of the proximal fifth phalanx. Seems to be well controlled pain wise. Will refer to hand surgery for further management.

## 2014-08-15 NOTE — Assessment & Plan Note (Signed)
Symptoms stable. Refilled Zoloft.

## 2014-08-26 ENCOUNTER — Encounter: Payer: Medicare Other | Admitting: Internal Medicine

## 2014-08-26 ENCOUNTER — Encounter: Payer: Self-pay | Admitting: Internal Medicine

## 2014-12-23 ENCOUNTER — Encounter: Payer: Self-pay | Admitting: *Deleted

## 2015-01-08 DIAGNOSIS — M24542 Contracture, left hand: Secondary | ICD-10-CM | POA: Diagnosis not present

## 2015-01-29 ENCOUNTER — Encounter: Payer: Self-pay | Admitting: Internal Medicine

## 2015-02-24 ENCOUNTER — Ambulatory Visit (INDEPENDENT_AMBULATORY_CARE_PROVIDER_SITE_OTHER): Payer: Medicare Other | Admitting: Internal Medicine

## 2015-02-24 ENCOUNTER — Encounter: Payer: Self-pay | Admitting: Internal Medicine

## 2015-02-24 VITALS — BP 128/69 | HR 72 | Temp 98.9°F | Ht 62.0 in | Wt 267.8 lb

## 2015-02-24 DIAGNOSIS — F1020 Alcohol dependence, uncomplicated: Secondary | ICD-10-CM | POA: Diagnosis not present

## 2015-02-24 DIAGNOSIS — F10220 Alcohol dependence with intoxication, uncomplicated: Secondary | ICD-10-CM

## 2015-02-24 DIAGNOSIS — F418 Other specified anxiety disorders: Secondary | ICD-10-CM

## 2015-02-24 DIAGNOSIS — F341 Dysthymic disorder: Secondary | ICD-10-CM

## 2015-02-24 DIAGNOSIS — Z6841 Body Mass Index (BMI) 40.0 and over, adult: Secondary | ICD-10-CM | POA: Diagnosis not present

## 2015-02-24 DIAGNOSIS — E876 Hypokalemia: Secondary | ICD-10-CM | POA: Diagnosis not present

## 2015-02-24 DIAGNOSIS — E119 Type 2 diabetes mellitus without complications: Secondary | ICD-10-CM

## 2015-02-24 DIAGNOSIS — E785 Hyperlipidemia, unspecified: Secondary | ICD-10-CM

## 2015-02-24 DIAGNOSIS — I10 Essential (primary) hypertension: Secondary | ICD-10-CM | POA: Diagnosis not present

## 2015-02-24 DIAGNOSIS — Z0000 Encounter for general adult medical examination without abnormal findings: Secondary | ICD-10-CM

## 2015-02-24 LAB — BASIC METABOLIC PANEL WITH GFR
BUN: 11 mg/dL (ref 6–23)
CO2: 26 mEq/L (ref 19–32)
Calcium: 9.4 mg/dL (ref 8.4–10.5)
Chloride: 102 mEq/L (ref 96–112)
Creat: 0.47 mg/dL — ABNORMAL LOW (ref 0.50–1.10)
GFR, Est African American: >89 mL/min
GFR, Est Non African American: >89 mL/min
Glucose, Bld: 121 mg/dL — ABNORMAL HIGH (ref 70–99)
Potassium: 3.4 mEq/L — ABNORMAL LOW (ref 3.5–5.3)
Sodium: 145 mEq/L (ref 135–145)

## 2015-02-24 LAB — LIPID PANEL
Cholesterol: 219 mg/dL — ABNORMAL HIGH (ref 0–200)
HDL: 133 mg/dL (ref >=46)
LDL Cholesterol: 67 mg/dL (ref 0–99)
Total CHOL/HDL Ratio: 1.6 Ratio
Triglycerides: 93 mg/dL (ref <150)
VLDL: 19 mg/dL (ref 0–40)

## 2015-02-24 LAB — POCT GLYCOSYLATED HEMOGLOBIN (HGB A1C): Hemoglobin A1C: 7

## 2015-02-24 LAB — GLUCOSE, CAPILLARY: Glucose-Capillary: 118 mg/dL — ABNORMAL HIGH (ref 70–99)

## 2015-02-24 NOTE — Assessment & Plan Note (Signed)
Patient is drinking about once per week, 1-2 drinks every time. No warning signs.  - Continue to monitor

## 2015-02-24 NOTE — Progress Notes (Signed)
Case discussed with Dr. Rivet at time of visit.  We reviewed the resident's history and exam and pertinent patient test results.  I agree with the assessment, diagnosis, and plan of care documented in the resident's note. 

## 2015-02-24 NOTE — Assessment & Plan Note (Signed)
Lab Results  Component Value Date   HGBA1C 7.0 02/24/2015   HGBA1C 7.0 08/15/2014   HGBA1C 6.7 04/28/2014     Assessment: Diabetes control: good control (HgbA1C at goal) Progress toward A1C goal:  at goal Comments: Diabetes well controlled. We discussed working on weight loss which could potentially cure her diabetes. She seems motivated to improve her diet and start walking.   Plan: Medications:  Continue Glipizide 5 mg daily and Metformin 500 mg BID Home glucose monitoring: Frequency: no home glucose monitoring Instruction/counseling given: reminded to bring medications to each visit, discussed the need for weight loss and discussed diet Other plans:  - Check urine microalbumin today - Encouraged her to start walking 3 times per week for 20-30 minutes and then increase to 30-40 minutes - Referral to Butch Penny to discuss nutrition in more depth

## 2015-02-24 NOTE — Assessment & Plan Note (Signed)
Symptoms stable.  - Continue Sertraline 100 mg daily

## 2015-02-24 NOTE — Assessment & Plan Note (Signed)
-   Check lipid profile today - Continue pravastatin 20 mg daily

## 2015-02-24 NOTE — Assessment & Plan Note (Signed)
Repeat bmet today 

## 2015-02-24 NOTE — Assessment & Plan Note (Signed)
-   Patient refused colonoscopy. States she will think about it and we will discuss again at next visit.  - Agreed to have mammogram. Will place order for bilateral screening mammogram.

## 2015-02-24 NOTE — Patient Instructions (Signed)
It was a pleasure to see you, Allison Caldwell.  - Start walking 3 times per week for 20-30 minutes. If that it too easy then increase the amount of time you are walking. - Eat more vegetables and fruits. Cut down on high carbohydrate foods (bread, pasta, rice, crackers) and avoid fried foods.  - Continue your current medications   General Instructions:   Please bring your medicines with you each time you come to clinic.  Medicines may include prescription medications, over-the-counter medications, herbal remedies, eye drops, vitamins, or other pills.   Progress Toward Treatment Goals:  Treatment Goal 08/15/2014  Hemoglobin A1C at goal  Blood pressure at goal  Prevent falls at goal    Self Care Goals & Plans:  Self Care Goal 02/24/2015  Manage my medications take my medicines as prescribed; bring my medications to every visit; refill my medications on time  Monitor my health keep track of my blood glucose  Eat healthy foods eat more vegetables; eat baked foods instead of fried foods; eat foods that are low in salt  Be physically active find an activity I enjoy; take a walk every day  Prevent falls have my vision checked; wear appropriate shoes    Home Blood Glucose Monitoring 08/15/2014  Check my blood sugar once a day  When to check my blood sugar before breakfast     Care Management & Community Referrals:  Referral 08/15/2014  Referrals made for care management support none needed

## 2015-02-24 NOTE — Assessment & Plan Note (Signed)
Current BMI 49. Patient seems motivated to lose weight, especially after I informed her she gained 10 lbs since October 2015. We discussed forming healthy eating habits by cutting out carbohydrates, especially white breads, pastas, crackers, etc and if she is going to eat carbs then to choose whole wheat or "brown" carbs. Also discussed incorporating more fruits and veggies into her diet and avoiding fried foods. She is also interested in starting to walk for exercise. We agreed for her to start out at 3 times a week for 20-30 minutes and then increase to 30-40 minutes.  - referral to Butch Penny to discuss nutrition - Continue to encourage healthy eating habits and exercise

## 2015-02-24 NOTE — Assessment & Plan Note (Signed)
BP Readings from Last 3 Encounters:  02/24/15 128/69  08/15/14 131/83  08/07/14 164/102    Lab Results  Component Value Date   NA 143 08/07/2014   K 4.0 08/07/2014   CREATININE 0.47* 08/07/2014    Assessment: Blood pressure control: controlled Progress toward BP goal:  at goal Comments: BP well controlled.   Plan: Medications: Continue Coreg 25 mg BID, Diltiazem 420 mg daily, and Olmesartan 20 mg daily.

## 2015-02-24 NOTE — Progress Notes (Signed)
   Subjective:    Patient ID: Allison Caldwell, female    DOB: [birth year], 69 y.o.   MRN: [id number]  HPI Ms. Valtierra is a 69yo woman with PMHx of HTN, Type 2 DM, morbid obesity, anxiety, and depression who presents today for a routine visit.   HTN: BP 128/69 today. Patient takes Coreg 25 mg BID, Diltiazem 420 mg daily, and Olmesartan 20 mg daily.   Type 2 DM: Last HbA1c 7.0 on 08/15/14. Patient does not check her blood sugars at home. She takes Glipizide 5 mg daily and Metformin 500 mg BID. She reports nocturia, but denies polydipsia and polyuria during the day.  Alcohol Dependence: Patient reports she drinks alcohol about once per week. She states she will have 1-2 vodka drinks when she drinks. She denies any falls or injuries related to alcohol use. She denies any eye-opener events.   HLD: Last lipid profile on 08/15/14 shows Chol 177, Trigly 79, HDL 97, and LDL 64. Patient takes Pravastatin 20 mg daily.   Anxiety/Depression: Patient takes sertraline 100 mg daily. She states she doesn't feel any different since taking the medication. She reports she has two sons that are incarcerated which "gets her down" occasionally but denies depression symptoms otherwise. She denies feeling hopeless, trouble sleeping, change in appetite, and suicidal/homidical ideation.   Morbid Obesity: BMI currently 49. Patient has gained 10 lb since October 2015. She states she remained in her house for most of the winter time. She also reports eating a lot of carbohydrates such as bread. She denies any current exercise regimen. She states she would like to start walking now that the weather is better.    Review of Systems General: Denies fever, chills, night sweats HEENT: Denies headaches, ear pain, changes in vision, rhinorrhea, sore throat CV: Denies CP, palpitations, SOB, orthopnea Pulm: Denies SOB, cough, wheezing GI: Denies abdominal pain, nausea, vomiting, diarrhea, constipation, melena, hematochezia GU:  Denies dysuria, hematuria, frequency Msk: Denies muscle cramps, joint pains Neuro: Denies weakness, numbness, tingling Skin: Denies rashes, bruising    Objective:   Physical Exam General: elderly obese woman sitting up in chair, NAD HEENT: [place]/AT, EOMI, PERRL, mucus membranes moist CV: RRR, no m/g/r Pulm: CTA bilaterally, breaths non-labored Abd: BS+, soft, obese, non-tender Ext: warm, no edema, moves all Neuro: alert and oriented x 3, no focal deficits    Assessment & Plan:  Please refer to A&P documentation.

## 2015-02-25 LAB — MICROALBUMIN / CREATININE URINE RATIO
Creatinine, Urine: 218.3 mg/dL
Microalb Creat Ratio: 18.8 mg/g (ref 0.0–30.0)
Microalb, Ur: 4.1 mg/dL — ABNORMAL HIGH (ref <2.0)

## 2015-02-28 ENCOUNTER — Emergency Department (HOSPITAL_COMMUNITY): Payer: Medicare Other

## 2015-02-28 ENCOUNTER — Encounter (HOSPITAL_COMMUNITY): Payer: Self-pay | Admitting: *Deleted

## 2015-02-28 ENCOUNTER — Emergency Department (HOSPITAL_COMMUNITY)
Admission: EM | Admit: 2015-02-28 | Discharge: 2015-02-28 | Disposition: A | Discharge status: Home / Self Care | Payer: Medicare Other | Attending: Emergency Medicine | Admitting: Emergency Medicine

## 2015-02-28 DIAGNOSIS — Z7982 Long term (current) use of aspirin: Secondary | ICD-10-CM | POA: Insufficient documentation

## 2015-02-28 DIAGNOSIS — I10 Essential (primary) hypertension: Secondary | ICD-10-CM | POA: Insufficient documentation

## 2015-02-28 DIAGNOSIS — Z8739 Personal history of other diseases of the musculoskeletal system and connective tissue: Secondary | ICD-10-CM | POA: Diagnosis not present

## 2015-02-28 DIAGNOSIS — R51 Headache: Secondary | ICD-10-CM

## 2015-02-28 DIAGNOSIS — Y9389 Activity, other specified: Secondary | ICD-10-CM | POA: Diagnosis not present

## 2015-02-28 DIAGNOSIS — S199XXA Unspecified injury of neck, initial encounter: Secondary | ICD-10-CM | POA: Diagnosis not present

## 2015-02-28 DIAGNOSIS — S0990XA Unspecified injury of head, initial encounter: Secondary | ICD-10-CM | POA: Diagnosis not present

## 2015-02-28 DIAGNOSIS — S24109A Unspecified injury at unspecified level of thoracic spinal cord, initial encounter: Secondary | ICD-10-CM | POA: Diagnosis present

## 2015-02-28 DIAGNOSIS — F10120 Alcohol abuse with intoxication, uncomplicated: Secondary | ICD-10-CM | POA: Insufficient documentation

## 2015-02-28 DIAGNOSIS — R519 Headache, unspecified: Secondary | ICD-10-CM

## 2015-02-28 DIAGNOSIS — T148 Other injury of unspecified body region: Secondary | ICD-10-CM | POA: Diagnosis not present

## 2015-02-28 DIAGNOSIS — F419 Anxiety disorder, unspecified: Secondary | ICD-10-CM | POA: Diagnosis not present

## 2015-02-28 DIAGNOSIS — Y998 Other external cause status: Secondary | ICD-10-CM | POA: Diagnosis not present

## 2015-02-28 DIAGNOSIS — Z79899 Other long term (current) drug therapy: Secondary | ICD-10-CM | POA: Diagnosis not present

## 2015-02-28 DIAGNOSIS — Z87891 Personal history of nicotine dependence: Secondary | ICD-10-CM | POA: Insufficient documentation

## 2015-02-28 DIAGNOSIS — Y9289 Other specified places as the place of occurrence of the external cause: Secondary | ICD-10-CM | POA: Insufficient documentation

## 2015-02-28 DIAGNOSIS — Z880 Allergy status to penicillin: Secondary | ICD-10-CM | POA: Insufficient documentation

## 2015-02-28 DIAGNOSIS — Z8673 Personal history of transient ischemic attack (TIA), and cerebral infarction without residual deficits: Secondary | ICD-10-CM | POA: Diagnosis not present

## 2015-02-28 DIAGNOSIS — S299XXA Unspecified injury of thorax, initial encounter: Secondary | ICD-10-CM | POA: Diagnosis not present

## 2015-02-28 DIAGNOSIS — W108XXA Fall (on) (from) other stairs and steps, initial encounter: Secondary | ICD-10-CM | POA: Insufficient documentation

## 2015-02-28 DIAGNOSIS — F329 Major depressive disorder, single episode, unspecified: Secondary | ICD-10-CM | POA: Insufficient documentation

## 2015-02-28 DIAGNOSIS — F10920 Alcohol use, unspecified with intoxication, uncomplicated: Secondary | ICD-10-CM

## 2015-02-28 DIAGNOSIS — E119 Type 2 diabetes mellitus without complications: Secondary | ICD-10-CM | POA: Insufficient documentation

## 2015-02-28 DIAGNOSIS — E669 Obesity, unspecified: Secondary | ICD-10-CM | POA: Insufficient documentation

## 2015-02-28 DIAGNOSIS — S0532XA Ocular laceration without prolapse or loss of intraocular tissue, left eye, initial encounter: Secondary | ICD-10-CM | POA: Diagnosis not present

## 2015-02-28 DIAGNOSIS — W19XXXA Unspecified fall, initial encounter: Secondary | ICD-10-CM

## 2015-02-28 DIAGNOSIS — M542 Cervicalgia: Secondary | ICD-10-CM | POA: Diagnosis not present

## 2015-02-28 DIAGNOSIS — M549 Dorsalgia, unspecified: Secondary | ICD-10-CM | POA: Diagnosis not present

## 2015-02-28 MED ORDER — ONDANSETRON 4 MG PO TBDP
4.0000 mg | ORAL_TABLET | Freq: Once | ORAL | Status: AC
  Administered 2015-02-28: 4 mg via ORAL
  Filled 2015-02-28: qty 1

## 2015-02-28 NOTE — ED Notes (Signed)
Pt presents via [facility] after falling down steps going out of her house.  Pt c/o neck, left elbow and left pinky pain.  Pt reports drinking since this AM and was going to a party.  Pt has a 1 in lac under her left eye, no bleeding.  Pt a x 4, NAD on arrival.  BP-140/120 CBG-111.

## 2015-02-28 NOTE — Discharge Instructions (Signed)
Fall Prevention and Home Safety Falls cause injuries and can affect all age groups. It is possible to use preventive measures to significantly decrease the likelihood of falls. There are many simple measures which can make your home safer and prevent falls. OUTDOORS  Repair cracks and edges of walkways and driveways.  Remove high doorway thresholds.  Trim shrubbery on the main path into your home.  Have good outside lighting.  Clear walkways of tools, rocks, debris, and clutter.  Check that handrails are not broken and are securely fastened. Both sides of steps should have handrails.  Have leaves, snow, and ice cleared regularly.  Use sand or salt on walkways during winter months.  In the garage, clean up grease or oil spills. BATHROOM  Install night lights.  Install grab bars by the toilet and in the tub and shower.  Use non-skid mats or decals in the tub or shower.  Place a plastic non-slip stool in the shower to sit on, if needed.  Keep floors dry and clean up all water on the floor immediately.  Remove soap buildup in the tub or shower on a regular basis.  Secure bath mats with non-slip, double-sided rug tape.  Remove throw rugs and tripping hazards from the floors. BEDROOMS  Install night lights.  Make sure a bedside light is easy to reach.  Do not use oversized bedding.  Keep a telephone by your bedside.  Have a firm chair with side arms to use for getting dressed.  Remove throw rugs and tripping hazards from the floor. KITCHEN  Keep handles on pots and pans turned toward the center of the stove. Use back burners when possible.  Clean up spills quickly and allow time for drying.  Avoid walking on wet floors.  Avoid hot utensils and knives.  Position shelves so they are not too high or low.  Place commonly used objects within easy reach.  If necessary, use a sturdy step stool with a grab bar when reaching.  Keep electrical cables out of the  way.  Do not use floor polish or wax that makes floors slippery. If you must use wax, use non-skid floor wax.  Remove throw rugs and tripping hazards from the floor. STAIRWAYS  Never leave objects on stairs.  Place handrails on both sides of stairways and use them. Fix any loose handrails. Make sure handrails on both sides of the stairways are as long as the stairs.  Check carpeting to make sure it is firmly attached along stairs. Make repairs to worn or loose carpet promptly.  Avoid placing throw rugs at the top or bottom of stairways, or properly secure the rug with carpet tape to prevent slippage. Get rid of throw rugs, if possible.  Have an electrician put in a light switch at the top and bottom of the stairs. OTHER FALL PREVENTION TIPS  Wear low-heel or rubber-soled shoes that are supportive and fit well. Wear closed toe shoes.  When using a stepladder, make sure it is fully opened and both spreaders are firmly locked. Do not climb a closed stepladder.  Add color or contrast paint or tape to grab bars and handrails in your home. Place contrasting color strips on first and last steps.  Learn and use mobility aids as needed. Install an electrical emergency response system.  Turn on lights to avoid dark areas. Replace light bulbs that burn out immediately. Get light switches that glow.  Arrange furniture to create clear pathways. Keep furniture in the same place.  Firmly attach carpet with non-skid or double-sided tape.  Eliminate uneven floor surfaces.  Select a carpet pattern that does not visually hide the edge of steps.  Be aware of all pets. OTHER HOME SAFETY TIPS  Set the water temperature for 120 F (48.8 C).  Keep emergency numbers on or near the telephone.  Keep smoke detectors on every level of the home and near sleeping areas. Document Released: 10/07/2002 Document Revised: 04/17/2012 Document Reviewed: 01/06/2012 [facility] Patient Information 2015  [place], [place]. This information is not intended to replace advice given to you by your health care provider. Make sure you discuss any questions you have with your health care provider.  Alcohol Intoxication Alcohol intoxication occurs when you drink enough alcohol that it affects your ability to function. It can be mild or very severe. Drinking a lot of alcohol in a short time is called binge drinking. This can be very harmful. Drinking alcohol can also be more dangerous if you are taking medicines or other drugs. Some of the effects caused by alcohol may include:  Loss of coordination.  Changes in mood and behavior.  Unclear thinking.  Trouble talking (slurred speech).  Throwing up (vomiting).  Confusion.  Slowed breathing.  Twitching and shaking (seizures).  Loss of consciousness. HOME CARE  Do not drive after drinking alcohol.  Drink enough water and fluids to keep your pee (urine) clear or pale yellow. Avoid caffeine.  Only take medicine as told by your doctor. GET HELP IF:  You throw up (vomit) many times.  You do not feel better after a few days.  You frequently have alcohol intoxication. Your doctor can help decide if you should see a substance use treatment counselor. GET HELP RIGHT AWAY IF:  You become shaky when you stop drinking.  You have twitching and shaking.  You throw up blood. It may look bright red or like coffee grounds.  You notice blood in your poop (bowel movements).  You become lightheaded or pass out (faint). MAKE SURE YOU:   Understand these instructions.  Will watch your condition.  Will get help right away if you are not doing well or get worse. Document Released: 04/04/2008 Document Revised: 06/19/2013 Document Reviewed: 03/22/2013 [facility] Patient Information 2015 [place], [place]. This information is not intended to replace advice given to you by your health care provider. Make sure you discuss any questions you have with your  health care provider.

## 2015-02-28 NOTE — ED Notes (Signed)
C collar removed per order.

## 2015-02-28 NOTE — ED Provider Notes (Addendum)
CSN: [id number]     Arrival date & time 02/28/15  1317 History   First MD Initiated Contact with Patient 02/28/15 1321     Chief Complaint  Patient presents with  . Fall     (Consider location/radiation/quality/duration/timing/severity/associated sxs/prior Treatment) HPI  Allison Caldwell This 69 year old female with a past medical history of hypertension, diabetes, morbid obesity, previous TIA, alcohol abuse, who presents to the emergency department with chief complaint of fall. Patient states that she was coming out of her home down the stairs using her cane. She lost her footing and fell backward onto her upper back. She is complaining of thoracic pain that radiates into her bilateral shoulders. She denies any shortness of breath. She denies hitting her head or losing consciousness. Patient arrives in c-collar. Patient states that she has been drinking alcohol this morning.  Past Medical History  Diagnosis Date  . Hypertension   . Diabetes mellitus     NIDDM  . Obesities, morbid   . TIA (transient ischemic attack)   . Depression   . Degenerative joint disease   . ETOH abuse   . Cardiomyopathy   . Anxiety    Past Surgical History  Procedure Laterality Date  . Orif ankle fracture      right  . Cesarean section      times 2  . Eye surgery      laser - right eye 2010  . Left wrist      surgical repair   Family History  Problem Relation Age of Onset  . Heart disease Mother   . Cancer Father   . Diabetes Sister   . Kidney disease Brother   . Stroke Brother    History  Substance Use Topics  . Smoking status: Former Smoker    Types: Cigarettes  . Smokeless tobacco: Not on file     Comment: Quit x 2 yrrs.  . Alcohol Use: 1.8 oz/week    3 Cans of beer per week     Comment: beer/vodka.   OB History    No data available     Review of Systems  Unable to perform ROS     Allergies  Ace inhibitors and Penicillins  Home Medications   Prior to Admission  medications   Medication Sig Start Date End Date Taking? Authorizing Provider  aspirin EC 81 MG tablet Take 81 mg by mouth every morning.     Historical Provider, MD  carvedilol (COREG) 25 MG tablet Take 1 tablet (25 mg total) by mouth 2 (two) times daily with a meal. 08/15/14   Jessee Avers, MD  diltiazem [facility]) 420 MG 24 hr capsule Take 1 capsule (420 mg total) by mouth daily. 08/15/14   Jessee Avers, MD  glipiZIDE (GLUCOTROL) 5 MG tablet Take 1 tablet (5 mg total) by mouth every morning. 08/15/14   Jessee Avers, MD  metFORMIN (GLUCOPHAGE) 500 MG tablet Take 1 tablet (500 mg total) by mouth 2 (two) times daily with a meal. 08/15/14   Jessee Avers, MD  Multiple Vitamin (MULITIVITAMIN WITH MINERALS) TABS Take 1 tablet by mouth daily. 11/30/11   Clarene Duke, MD  olmesartan (BENICAR) 20 MG tablet Take 1 tablet (20 mg total) by mouth every morning. 04/28/14   Juluis Mire, MD  pravastatin (PRAVACHOL) 20 MG tablet Take 1 tablet (20 mg total) by mouth at bedtime. 08/15/14   Jessee Avers, MD  sertraline (ZOLOFT) 100 MG tablet Take 1 tablet (100 mg total) by mouth every morning.  08/15/14   Jessee Avers, MD   BP 159/116 mmHg  Pulse 103  Temp(Src) 97.6 F (36.4 C) (Oral)  Resp 18  SpO2 95% Physical Exam  Constitutional: She appears well-developed and well-nourished. No distress.  HENT:  Head: Normocephalic.  NO HEMATOMA. TENDER TO OCCIPUT  Eyes: Conjunctivae are normal. No scleral icterus.  Neck: Normal range of motion.  In c collar  Cardiovascular: Normal rate, regular rhythm and normal heart sounds.  Exam reveals no gallop and no friction rub.   No murmur heard. Pulmonary/Chest: Effort normal and breath sounds normal. No respiratory distress.  Abdominal: Soft. Bowel sounds are normal. She exhibits no distension and no mass. There is no tenderness. There is no guarding.  Neurological: She is alert.  Full strength upper extremities. SLURRED SPEECH  Skin: Skin is warm  and dry. She is not diaphoretic.  Vitals reviewed.   ED Course  Procedures (including critical care time) Labs Review Labs Reviewed - No data to display  Imaging Review No results found.   EKG Interpretation None      MDM   Final diagnoses:  Fall  Alcohol intoxication, uncomplicated  elderly, intoxicated patient with fall backward onto the steps.  No evidence of head trauma, but c/o cervical pain.  Increased RF for subdural hematoma due to age/ hx of etoh abuse. Will obtain images of extremity complaints,c/spine, and head CT.   Patients imaging without acute abnormality. Patient is able to ambulate without assistance in the emergency department. She appears clinically sober. Patient will be discharged home.     Margarita Mail, PA-C 03/01/15 [place], MD 03/01/15 [address], PA-C 03/12/15 1944  Serita Grit, MD 03/14/15 [phone]

## 2015-03-01 NOTE — ED Provider Notes (Signed)
Medical screening examination/treatment/procedure(s) were conducted as a shared visit with non-physician practitioner(s) and myself.  I personally evaluated the patient during the encounter.   EKG Interpretation None      69 yo female with hx of alcohol abuse who fell.  On exam, well appearing, nontoxic, not distressed, normal respiratory effort, normal perfusion, able ambulate with usual assistance.  Imaging negative.  DC"d home.    Clinical Impression: 1. Alcohol intoxication, uncomplicated   2. Bryson Corona, MD 03/01/15 [phone]

## 2015-03-16 ENCOUNTER — Ambulatory Visit: Payer: Medicare Other | Admitting: Dietician

## 2015-03-17 ENCOUNTER — Ambulatory Visit (HOSPITAL_COMMUNITY)
Admission: RE | Admit: 2015-03-17 | Discharge: 2015-03-17 | Disposition: A | Discharge status: Home / Self Care | Payer: Medicare Other | Source: Ambulatory Visit | Attending: Internal Medicine | Admitting: Internal Medicine

## 2015-03-17 DIAGNOSIS — Z1231 Encounter for screening mammogram for malignant neoplasm of breast: Secondary | ICD-10-CM | POA: Insufficient documentation

## 2015-03-17 DIAGNOSIS — Z0000 Encounter for general adult medical examination without abnormal findings: Secondary | ICD-10-CM

## 2015-03-24 ENCOUNTER — Telehealth: Payer: Self-pay | Admitting: Dietician

## 2015-03-24 NOTE — Telephone Encounter (Signed)
Call to confirm appointment for 03/25/15 at 2:30 no answer

## 2015-03-25 ENCOUNTER — Ambulatory Visit: Payer: Medicare Other | Admitting: Dietician

## 2015-03-25 ENCOUNTER — Encounter: Payer: Self-pay | Admitting: Internal Medicine

## 2015-04-01 NOTE — Addendum Note (Signed)
Addended by: Hulan Fray on: 04/01/2015 04:50 PM   Modules accepted: Orders

## 2015-04-12 NOTE — ED Provider Notes (Signed)
CSN: [id number]     Arrival date & time 02/28/15  1317 History   First MD Initiated Contact with Patient 02/28/15 1321     Chief Complaint  Patient presents with  . Fall     (Consider location/radiation/quality/duration/timing/severity/associated sxs/prior Treatment) Fall  Patient is a 69 y.o. female presenting with fall.    Allison Caldwell This 69 year old female with a past medical history of hypertension, diabetes, morbid obesity, previous TIA, alcohol abuse, who presents to the emergency department with chief complaint of fall.There is a level 5 caveat due to alcohol intoxication. Patient states that she was coming out of her home down the stairs using her cane. She lost her footing and fell backward onto her upper back. She is complaining of thoracic pain that radiates into her bilateral shoulders. She denies any shortness of breath. She denies hitting her head or losing consciousness. Patient arrives in c-collar. Patient states that she has been drinking alcohol this morning.  Past Medical History  Diagnosis Date  . Hypertension   . Diabetes mellitus     NIDDM  . Obesities, morbid   . TIA (transient ischemic attack)   . Depression   . Degenerative joint disease   . ETOH abuse   . Cardiomyopathy   . Anxiety    Past Surgical History  Procedure Laterality Date  . Orif ankle fracture      right  . Cesarean section      times 2  . Eye surgery      laser - right eye 2010  . Left wrist      surgical repair   Family History  Problem Relation Age of Onset  . Heart disease Mother   . Cancer Father   . Diabetes Sister   . Kidney disease Brother   . Stroke Brother    History  Substance Use Topics  . Smoking status: Former Smoker    Types: Cigarettes  . Smokeless tobacco: Not on file     Comment: Quit x 2 yrrs.  . Alcohol Use: 1.8 oz/week    3 Cans of beer per week     Comment: beer/vodka.   OB History    No data available     Review of Systems  Unable  to perform ROS     Allergies  Ace inhibitors and Penicillins  Home Medications   Prior to Admission medications   Medication Sig Start Date End Date Taking? Authorizing Provider  aspirin EC 81 MG tablet Take 81 mg by mouth every morning.     Historical Provider, MD  carvedilol (COREG) 25 MG tablet Take 1 tablet (25 mg total) by mouth 2 (two) times daily with a meal. 08/15/14   Jessee Avers, MD  diltiazem [facility]) 420 MG 24 hr capsule Take 1 capsule (420 mg total) by mouth daily. 08/15/14   Jessee Avers, MD  glipiZIDE (GLUCOTROL) 5 MG tablet Take 1 tablet (5 mg total) by mouth every morning. 08/15/14   Jessee Avers, MD  metFORMIN (GLUCOPHAGE) 500 MG tablet Take 1 tablet (500 mg total) by mouth 2 (two) times daily with a meal. 08/15/14   Jessee Avers, MD  Multiple Vitamin (MULITIVITAMIN WITH MINERALS) TABS Take 1 tablet by mouth daily. 11/30/11   Clarene Duke, MD  olmesartan (BENICAR) 20 MG tablet Take 1 tablet (20 mg total) by mouth every morning. 04/28/14   Juluis Mire, MD  pravastatin (PRAVACHOL) 20 MG tablet Take 1 tablet (20 mg total) by mouth at bedtime. 08/15/14  Jessee Avers, MD  sertraline (ZOLOFT) 100 MG tablet Take 1 tablet (100 mg total) by mouth every morning. 08/15/14   Jessee Avers, MD   BP 176/93 mmHg  Pulse 102  Temp(Src) 98.4 F (36.9 C) (Oral)  Resp 18  SpO2 93% Physical Exam  Constitutional: She appears well-developed and well-nourished. No distress.  HENT:  Head: Normocephalic.  NO HEMATOMA. TENDER TO OCCIPUT  Eyes: Conjunctivae are normal. No scleral icterus.  Neck: Normal range of motion.  In c collar  Cardiovascular: Normal rate, regular rhythm and normal heart sounds.  Exam reveals no gallop and no friction rub.   No murmur heard. Pulmonary/Chest: Effort normal and breath sounds normal. No respiratory distress.  Abdominal: Soft. Bowel sounds are normal. She exhibits no distension and no mass. There is no tenderness. There is no  guarding.  Neurological: She is alert.  Full strength upper extremities. SLURRED SPEECH  Skin: Skin is warm and dry. She is not diaphoretic.  Vitals reviewed.   ED Course  Procedures (including critical care time) Labs Review Labs Reviewed - No data to display  Imaging Review No results found.   EKG Interpretation None      MDM   Final diagnoses:  Fall  Alcohol intoxication, uncomplicated  Occipital pain  elderly, intoxicated patient with fall backward onto the steps.  She is tender to palpation in the occiput although there is no visible hematoma. Shec/o cervical pain.  Increased RF for subdural hematoma due to age/ hx of etoh abuse. Will obtain images of extremity complaints,c/spine, and head CT.   Patients imaging without acute abnormality. Patient is able to ambulate without assistance in the emergency department. She appears clinically sober. Patient will be discharged home.     Margarita Mail, PA-C 03/01/15 [place], MD 03/01/15 [address] PA-C 03/12/15 1944  Serita Grit, MD 03/14/15 1111  Margarita Mail, PA-C 04/12/15 2059  Serita Grit, MD 04/13/15 [phone]

## 2015-04-16 ENCOUNTER — Other Ambulatory Visit: Payer: Self-pay | Admitting: Internal Medicine

## 2015-05-05 DIAGNOSIS — H40012 Open angle with borderline findings, low risk, left eye: Secondary | ICD-10-CM | POA: Diagnosis not present

## 2015-05-05 DIAGNOSIS — E119 Type 2 diabetes mellitus without complications: Secondary | ICD-10-CM | POA: Diagnosis not present

## 2015-05-05 DIAGNOSIS — H4011X3 Primary open-angle glaucoma, severe stage: Secondary | ICD-10-CM | POA: Diagnosis not present

## 2015-06-01 ENCOUNTER — Other Ambulatory Visit: Payer: Self-pay | Admitting: Internal Medicine

## 2015-06-12 NOTE — Telephone Encounter (Signed)
Pt called requesting carvedilol to be filled.

## 2015-06-18 NOTE — Telephone Encounter (Signed)
Filled today

## 2015-06-30 ENCOUNTER — Other Ambulatory Visit: Payer: Self-pay | Admitting: Internal Medicine

## 2015-06-30 ENCOUNTER — Encounter: Payer: Medicare Other | Admitting: Internal Medicine

## 2015-06-30 ENCOUNTER — Encounter: Payer: Self-pay | Admitting: Internal Medicine

## 2015-07-21 ENCOUNTER — Other Ambulatory Visit: Payer: Self-pay | Admitting: Internal Medicine

## 2015-09-11 ENCOUNTER — Ambulatory Visit: Payer: Self-pay | Admitting: Internal Medicine

## 2015-09-28 ENCOUNTER — Encounter: Payer: Self-pay | Admitting: Family Medicine

## 2015-09-28 ENCOUNTER — Ambulatory Visit: Payer: Commercial Managed Care - HMO | Attending: Family Medicine | Admitting: Family Medicine

## 2015-09-28 VITALS — BP 144/89 | HR 68 | Temp 98.9°F | Resp 16 | Ht 62.0 in | Wt 266.0 lb

## 2015-09-28 DIAGNOSIS — Z87891 Personal history of nicotine dependence: Secondary | ICD-10-CM | POA: Insufficient documentation

## 2015-09-28 DIAGNOSIS — Z7982 Long term (current) use of aspirin: Secondary | ICD-10-CM | POA: Diagnosis not present

## 2015-09-28 DIAGNOSIS — M545 Low back pain, unspecified: Secondary | ICD-10-CM | POA: Insufficient documentation

## 2015-09-28 DIAGNOSIS — M25561 Pain in right knee: Secondary | ICD-10-CM | POA: Diagnosis not present

## 2015-09-28 DIAGNOSIS — I10 Essential (primary) hypertension: Secondary | ICD-10-CM | POA: Insufficient documentation

## 2015-09-28 DIAGNOSIS — Z7984 Long term (current) use of oral hypoglycemic drugs: Secondary | ICD-10-CM | POA: Insufficient documentation

## 2015-09-28 DIAGNOSIS — E119 Type 2 diabetes mellitus without complications: Secondary | ICD-10-CM | POA: Insufficient documentation

## 2015-09-28 DIAGNOSIS — R6889 Other general symptoms and signs: Secondary | ICD-10-CM | POA: Diagnosis not present

## 2015-09-28 DIAGNOSIS — H409 Unspecified glaucoma: Secondary | ICD-10-CM | POA: Diagnosis not present

## 2015-09-28 DIAGNOSIS — R350 Frequency of micturition: Secondary | ICD-10-CM | POA: Insufficient documentation

## 2015-09-28 DIAGNOSIS — E876 Hypokalemia: Secondary | ICD-10-CM

## 2015-09-28 DIAGNOSIS — B351 Tinea unguium: Secondary | ICD-10-CM | POA: Insufficient documentation

## 2015-09-28 DIAGNOSIS — Z79899 Other long term (current) drug therapy: Secondary | ICD-10-CM | POA: Insufficient documentation

## 2015-09-28 LAB — POCT GLYCOSYLATED HEMOGLOBIN (HGB A1C): Hemoglobin A1C: 7.1

## 2015-09-28 LAB — GLUCOSE, POCT (MANUAL RESULT ENTRY): POC Glucose: 129 mg/dl — AB (ref 70–99)

## 2015-09-28 MED ORDER — OLMESARTAN MEDOXOMIL 40 MG PO TABS: 40.0000 mg | ORAL_TABLET | Freq: Every day | ORAL | Status: DC

## 2015-09-28 MED ORDER — DILTIAZEM HCL ER BEADS 420 MG PO CP24: 420.0000 mg | ORAL_CAPSULE | Freq: Every day | ORAL | Status: DC

## 2015-09-28 MED ORDER — METHYLPREDNISOLONE ACETATE 40 MG/ML IJ SUSP
40.0000 mg | Freq: Once | INTRAMUSCULAR | Status: AC
  Administered 2015-09-28: 40 mg via INTRA_ARTICULAR

## 2015-09-28 MED ORDER — METFORMIN HCL 500 MG PO TABS: 500.0000 mg | ORAL_TABLET | Freq: Two times a day (BID) | ORAL | Status: DC

## 2015-09-28 MED ORDER — GLIPIZIDE 5 MG PO TABS: 5.0000 mg | ORAL_TABLET | Freq: Every day | ORAL | Status: DC

## 2015-09-28 NOTE — Progress Notes (Signed)
F/U DM HTN Taking medication as prescribed  No tobacco user  No pain today  No suicide thought in the past two weeks

## 2015-09-28 NOTE — Assessment & Plan Note (Addendum)
A: BP well controlled in elderly patient. Pulse on low on combo of ditliazem and coreg P: Please taper off coreg due to low pulse risk for bradycardia  Taper: dose once daily for one week, then every other day for one week, then stop the medication  Continue diltiazem Continue benicar, increase to 40 mg daily

## 2015-09-28 NOTE — Assessment & Plan Note (Signed)
A: well controlled P: Continue current regimen  

## 2015-09-28 NOTE — Progress Notes (Signed)
Patient ID: Allison Caldwell, female   DOB: [birth year], 69 y.o.   MRN: [id number]   Subjective:  Patient ID: Allison Caldwell, female    DOB: January 08, 1946  Age: 69 y.o. MRN: [id number]  CC: Diabetes and Hypertension   HPI Hosanna Moulin presents for    1. CHRONIC DIABETES  Disease Monitoring  Blood Sugar Ranges: not checking   Polyuria: at night only    Visual problems: no   Medication Compliance: yes  Medication Side Effects  Hypoglycemia: no   Preventitive Health Care  Eye Exam: done in 05/2015. F/u in one year. Has glaucoma.   Foot Exam: done today   Diet pattern: regular meals, late night snack   Exercise: no  2. CHRONIC HYPERTENSION  Disease Monitoring  Blood pressure range: not checking   Chest pain: no   Dyspnea: no   Claudication: no   Medication compliance: yes  Medication Side Effects  Lightheadedness: no   Urinary frequency: yes, at night    Edema: no    Preventitive Healthcare:  Exercise: no   Salt Restriction: yes   3. R knee pain: x 3 years. Since fall while getting into her sister-in-law's SUV. Patient fell back. Has medial knee pain with stiffness. Previous crepitus. This has improved. Walks with cane. Uses SCAT. Pain interferes with exercise. She takes ibuprofen for pain which helps.   4. Low back pain: midline b/l low back x 3-4 years. Pain does not radiate. Worse with prolonged standing and going up and down steps.   Social History  Substance Use Topics  . Smoking status: Former Smoker    Types: Cigarettes  . Smokeless tobacco: Not on file     Comment: Quit x 2 yrrs.  . Alcohol Use: 1.8 oz/week    3 Cans of beer per week     Comment: beer/vodka.    Outpatient Prescriptions Prior to Visit  Medication Sig Dispense Refill  . aspirin EC 81 MG tablet Take 81 mg by mouth every morning.     Marland Kitchen BENICAR 20 MG tablet TAKE ONE TABLET BY MOUTH IN THE MORNING 90 tablet 3  . carvedilol (COREG) 25 MG tablet TAKE ONE TABLET BY MOUTH TWICE  DAILY WITH MEALS 60 tablet 5  . diltiazem (TIAZAC) 420 MG 24 hr capsule TAKE ONE CAPSULE BY MOUTH ONCE DAILY 30 capsule 5  . glipiZIDE (GLUCOTROL) 5 MG tablet TAKE ONE TABLET BY MOUTH EVERY MORNING 60 tablet 0  . metFORMIN (GLUCOPHAGE) 500 MG tablet TAKE ONE TABLET BY MOUTH TWICE DAILY WITH MEALS 120 tablet 0  . Multiple Vitamin (MULITIVITAMIN WITH MINERALS) TABS Take 1 tablet by mouth daily.    . pravastatin (PRAVACHOL) 20 MG tablet Take 1 tablet (20 mg total) by mouth at bedtime. 90 tablet 3  . sertraline (ZOLOFT) 100 MG tablet TAKE ONE TABLET BY MOUTH EVERY MORNING 30 tablet 3   No facility-administered medications prior to visit.    ROS Review of Systems  Constitutional: Negative for fever and chills.  Eyes: Negative for visual disturbance.  Respiratory: Negative for shortness of breath.   Cardiovascular: Negative for chest pain.  Gastrointestinal: Negative for abdominal pain and blood in stool.  Musculoskeletal: Positive for back pain, arthralgias and gait problem.  Skin: Negative for rash.  Allergic/Immunologic: Negative for immunocompromised state.  Hematological: Negative for adenopathy. Does not bruise/bleed easily.  Psychiatric/Behavioral: Negative for suicidal ideas and dysphoric mood.    Objective:  BP 144/89 mmHg  Pulse 68  Temp(Src) 98.9 F (37.2  C) (Oral)  Resp 16  Ht 5\' 2"  (1.575 m)  Wt 266 lb (120.657 kg)  BMI 48.64 kg/m2  SpO2 96%  BP/Weight 09/28/2015 02/28/2015 [id number]  Systolic BP 123456 [id number]  Diastolic BP 89 93 69  Wt. (Lbs) 266 - 267.8  BMI 48.64 - 48.97   Physical Exam  Constitutional: She is oriented to person, place, and time. She appears well-developed and well-nourished. No distress.  HENT:  Head: Normocephalic and atraumatic.  Cardiovascular: Normal rate, regular rhythm, normal heart sounds and intact distal pulses.   Pulmonary/Chest: Effort normal and breath sounds normal.  Musculoskeletal: She exhibits no edema.  Neurological: She is  alert and oriented to person, place, and time.  Skin: Skin is warm and dry. No rash noted.  Psychiatric: She has a normal mood and affect.   Lab Results  Component Value Date   HGBA1C 7.10 09/28/2015   CBG 129  After obtaining informed consent and cleaning the skin using iodine and alcohol a  steroid injection was performed at R knee using 1% plain Lidocaine and 40 mg of Depo medrol . This was well tolerated  Assessment & Plan:   Problem List Items Addressed This Visit    Diabetes type 2, controlled ([place]) - Primary (Chronic)    A: well controlled. P: Continue current regimen       Relevant Medications   glipiZIDE (GLUCOTROL) 5 MG tablet   metFORMIN (GLUCOPHAGE) 500 MG tablet   olmesartan (BENICAR) 40 MG tablet   Other Relevant Orders   POCT glycosylated hemoglobin (Hb A1C) (Completed)   POCT glucose (manual entry) (Completed)   HYPERTENSION, BENIGN (Chronic)    A: BP well controlled in elderly patient. Pulse on low on combo of ditliazem and coreg P: Please taper off coreg due to low pulse risk for bradycardia  Taper: dose once daily for one week, then every other day for one week, then stop the medication  Continue diltiazem Continue benicar, increase to 40 mg daily         Relevant Medications   diltiazem (TIAZAC) 420 MG 24 hr capsule   olmesartan (BENICAR) 40 MG tablet   Other Relevant Orders   COMPLETE METABOLIC PANEL WITH GFR   Hypokalemia   Relevant Orders   COMPLETE METABOLIC PANEL WITH GFR   Magnesium   Hypomagnesemia   Relevant Orders   COMPLETE METABOLIC PANEL WITH GFR   Magnesium   Low back pain   Relevant Medications   methylPREDNISolone acetate (DEPO-MEDROL) injection 40 mg (Start on 09/28/2015  5:45 PM)   Other Relevant Orders   Ambulatory referral to Physical Therapy   Onychomycosis of toenail   Relevant Orders   Ambulatory referral to Podiatry   Right knee pain   Relevant Medications   methylPREDNISolone acetate (DEPO-MEDROL) injection 40  mg (Start on 09/28/2015  5:45 PM)   Other Relevant Orders   Ambulatory referral to Physical Therapy      No orders of the defined types were placed in this encounter.    Follow-up: No Follow-up on file.   Boykin Nearing MD

## 2015-09-28 NOTE — Assessment & Plan Note (Signed)
In R eye only Yearly follow up No eye drops prescribed

## 2015-09-28 NOTE — Patient Instructions (Addendum)
Allison Caldwell was seen today for diabetes and hypertension.  Diagnoses and all orders for this visit:  Controlled type 2 diabetes mellitus without complication, unspecified long term insulin use status (HCC) -     POCT glycosylated hemoglobin (Hb A1C) -     POCT glucose (manual entry) -     glipiZIDE (GLUCOTROL) 5 MG tablet; Take 1 tablet (5 mg total) by mouth daily before breakfast. -     metFORMIN (GLUCOPHAGE) 500 MG tablet; Take 1 tablet (500 mg total) by mouth 2 (two) times daily with a meal.  HYPERTENSION, BENIGN -     COMPLETE METABOLIC PANEL WITH GFR -     diltiazem (TIAZAC) 420 MG 24 hr capsule; Take 1 capsule (420 mg total) by mouth daily. -     olmesartan (BENICAR) 40 MG tablet; Take 1 tablet (40 mg total) by mouth daily.  Hypokalemia -     COMPLETE METABOLIC PANEL WITH GFR -     Magnesium  Hypomagnesemia -     COMPLETE METABOLIC PANEL WITH GFR -     Magnesium  Right knee pain -     Ambulatory referral to Physical Therapy  Bilateral low back pain without sciatica -     Ambulatory referral to Physical Therapy    Please taper off coreg due to low pulse risk for bradycardia  Taper: dose once daily for one week, then every other day for one week, then stop the medication  Continue diltiazem Continue benicar, increase to 40 mg daily   You have received a shot of steroid in your joint today. Rest and ice knee today. Regular activity tomorrow. Look out for redness, swelling, fever,severe pain in joint and call if you experience these symptoms.  F/u in 4 weeks for pharmacy BP check  F/u with me in 3 months for HTN and diabetes    Dr. Adrian Blackwater

## 2015-09-29 LAB — COMPLETE METABOLIC PANEL WITH GFR
ALT: 8 U/L (ref 6–29)
AST: 12 U/L (ref 10–35)
Albumin: 4.5 g/dL (ref 3.6–5.1)
Alkaline Phosphatase: 45 U/L (ref 33–130)
BUN: 14 mg/dL (ref 7–25)
CO2: 29 mmol/L (ref 20–31)
Calcium: 9.4 mg/dL (ref 8.6–10.4)
Chloride: 100 mmol/L (ref 98–110)
Creat: 0.68 mg/dL (ref 0.60–0.93)
GFR, Est African American: >89 mL/min (ref >=60)
GFR, Est Non African American: 88 mL/min (ref >=60)
Glucose, Bld: 113 mg/dL — ABNORMAL HIGH (ref 65–99)
Potassium: 3.8 mmol/L (ref 3.5–5.3)
Sodium: 138 mmol/L (ref 135–146)
Total Bilirubin: 0.4 mg/dL (ref 0.2–1.2)
Total Protein: 7.2 g/dL (ref 6.1–8.1)

## 2015-09-29 LAB — MAGNESIUM: Magnesium: 1.3 mg/dL — ABNORMAL LOW (ref 1.5–2.5)

## 2015-09-30 ENCOUNTER — Telehealth: Payer: Self-pay | Admitting: *Deleted

## 2015-09-30 NOTE — Telephone Encounter (Signed)
LVM to return call.

## 2015-09-30 NOTE — Telephone Encounter (Signed)
Pt returned call Date of birth verified by pt Lab results given Pt verbalized understanding

## 2015-09-30 NOTE — Telephone Encounter (Signed)
-----   Message from Boykin Nearing, MD sent at 09/29/2015  2:11 PM EST ----- CMP normal Potassium normal Mag is still a bit low but improved  Since potassium is normal, no need to supplement magnesium

## 2015-10-05 ENCOUNTER — Telehealth: Payer: Self-pay | Admitting: Family Medicine

## 2015-10-05 NOTE — Telephone Encounter (Signed)
Pt stated been taking medication as prescribed  Schedule appointment with nurse to recheck bp  Advised to bring all medication to appointment

## 2015-10-05 NOTE — Telephone Encounter (Signed)
Please call back to patient to verify current medications and doses Per last OV she was to taper coreg to off Continue diltiazem at same dose of 420 mg daily  Increase benicar from 20 to 40 mg daily  Has she done this? If she did this and is still dizzy She should half benicar back to 20 mg daily and schedule pharmacy visit for BP and HR check.

## 2015-10-05 NOTE — Telephone Encounter (Signed)
Patient called stating that she has been dizzy ever since her medication dosage was increased. Patient would like to speak with PCP in regards to this issue. Please f/u with pt.

## 2015-10-13 ENCOUNTER — Ambulatory Visit: Payer: Commercial Managed Care - HMO | Attending: Family Medicine | Admitting: Pharmacist

## 2015-10-13 VITALS — BP 154/109 | HR 79

## 2015-10-13 DIAGNOSIS — Z79899 Other long term (current) drug therapy: Secondary | ICD-10-CM | POA: Insufficient documentation

## 2015-10-13 DIAGNOSIS — I10 Essential (primary) hypertension: Secondary | ICD-10-CM | POA: Diagnosis not present

## 2015-10-13 DIAGNOSIS — R6889 Other general symptoms and signs: Secondary | ICD-10-CM | POA: Diagnosis not present

## 2015-10-13 MED ORDER — OLMESARTAN MEDOXOMIL 20 MG PO TABS: 20.0000 mg | ORAL_TABLET | Freq: Every day | ORAL | Status: DC

## 2015-10-13 NOTE — Patient Instructions (Addendum)
Thanks for coming in to see me today  Decrease olmesartan (Benicar) to 20 mg daily.  I hope this makes you feel better  If the dizziness gets worse, schedule an appointment with Dr. Adrian Blackwater  Come back and see me in 1-2 weeks for a blood pressure check

## 2015-10-13 NOTE — Progress Notes (Signed)
S:    Patient arrives in good spirits.  Presents to the clinic for hypertension evaluation.   Patient reports adherence with medications.  Current BP Medications include:  Carvedilol (tapering off), olmesartan 40 mg daily, diltiazem 420 mg daily.   Antihypertensives tried in the past include: ACEi (cough)  Patient reports dizziness, especially when changing positions such as standing up or lying down. It started a few days after she started the new dose of the olmesartan.    O:   Last 3 Office BP readings: BP Readings from Last 3 Encounters:  10/13/15 154/109  09/28/15 144/89  02/28/15 176/93    BMET    Component Value Date/Time   NA 138 09/28/2015 1738   K 3.8 09/28/2015 1738   CL 100 09/28/2015 1738   CO2 29 09/28/2015 1738   GLUCOSE 113* 09/28/2015 1738   BUN 14 09/28/2015 1738   CREATININE 0.68 09/28/2015 1738   CREATININE 0.47* 08/07/2014 0432   CALCIUM 9.4 09/28/2015 1738   GFRNONAA 88 09/28/2015 1738   GFRNONAA >90 08/07/2014 0432   GFRAA >89 09/28/2015 1738   GFRAA >90 08/07/2014 0432    A/P: History of hypertension currently UNcontrolled on current medications with dizziness upon standing and lying down. Her orthostatics were negative (blood pressure actually increased upon standing) but there was great fluctuation in her blood pressure. She could be having some orthostasis at home. Will decrease olmesartan to 20 mg daily. Patient will continue diltiazem 420 mg daily and will continue to titrate off of carvedilol. Patient will follow up for a blood pressure check in 1-2 weeks. She will return to see Dr. Adrian Blackwater if dizziness worsens or new symptoms arise. Patient verbalized understanding.  Results reviewed and written information provided.   Total time in face-to-face counseling 35 minutes.  F/U Clinic Visit with me in 1-2 weeks.

## 2015-10-27 ENCOUNTER — Ambulatory Visit: Payer: Medicare Other | Admitting: Physical Therapy

## 2015-11-04 ENCOUNTER — Other Ambulatory Visit: Payer: Self-pay | Admitting: Internal Medicine

## 2015-11-05 ENCOUNTER — Other Ambulatory Visit: Payer: Self-pay | Admitting: Internal Medicine

## 2015-11-05 ENCOUNTER — Telehealth: Payer: Self-pay | Admitting: Family Medicine

## 2015-11-05 ENCOUNTER — Telehealth: Payer: Self-pay | Admitting: *Deleted

## 2015-11-05 NOTE — Telephone Encounter (Signed)
Patient verified DOB Patient states pharmacy did not have medications on file. Medical Assistant contacted pharmacy and verified patients medications were ready for pick-up. Pharmacy stated Benicar would not be able to be filled until 11/09/15. Medical Assistant informed patient of medications being ready for pickup and Benicar needing to be picked up Monday. Patient expressed her understanding and had no further questions.

## 2015-11-05 NOTE — Telephone Encounter (Signed)
LVM to return call  Pt has refills at [facility]  Need to call pharmacy for refills

## 2015-11-05 NOTE — Telephone Encounter (Signed)
Pt advised to call pharmacy for refills  

## 2015-11-05 NOTE — Telephone Encounter (Signed)
Patient is calling to request refills for  Benicar, glipizide, diltiazem, pravastatin, sertraline...please send patients pharmacy.Marland KitchenMarland Kitchen

## 2015-11-05 NOTE — Telephone Encounter (Signed)
Patient returned nurse phone call.

## 2015-11-18 ENCOUNTER — Encounter: Payer: Self-pay | Admitting: Podiatry

## 2015-11-18 ENCOUNTER — Ambulatory Visit (INDEPENDENT_AMBULATORY_CARE_PROVIDER_SITE_OTHER): Payer: Commercial Managed Care - HMO | Admitting: Podiatry

## 2015-11-18 VITALS — BP 154/97 | HR 96 | Resp 16 | Ht 62.0 in | Wt 266.0 lb

## 2015-11-18 DIAGNOSIS — B351 Tinea unguium: Secondary | ICD-10-CM | POA: Diagnosis not present

## 2015-11-18 DIAGNOSIS — M79675 Pain in left toe(s): Secondary | ICD-10-CM | POA: Diagnosis not present

## 2015-11-18 DIAGNOSIS — R6889 Other general symptoms and signs: Secondary | ICD-10-CM | POA: Diagnosis not present

## 2015-11-18 DIAGNOSIS — M79674 Pain in right toe(s): Secondary | ICD-10-CM | POA: Diagnosis not present

## 2015-11-18 DIAGNOSIS — M79604 Pain in right leg: Secondary | ICD-10-CM

## 2015-11-18 DIAGNOSIS — M79605 Pain in left leg: Secondary | ICD-10-CM

## 2015-11-18 NOTE — Progress Notes (Signed)
Subjective:     Patient ID: Allison Caldwell, female   DOB: 07-07-1946, 70 y.o.   MRN: [id number]  HPI patient presents after not being seen for a number of years with nail disease 1-5 both feet that she cannot cut obesity and long-term diabetes   Review of Systems  All other systems reviewed and are negative.      Objective:   Physical Exam  Constitutional: She is oriented to person, place, and time.  Cardiovascular: Intact distal pulses.   Musculoskeletal: Normal range of motion.  Neurological: She is oriented to person, place, and time.  Skin: Skin is warm and dry.  Nursing note and vitals reviewed.  neurovascular status found to be intact with muscle strength found to be adequate and diminishment of just sharp dull and vibratory currently. Patient's found have elongated nailbeds 1-5 both feet that are thick yellow and brittle and they become quite tender and painful with increased sensitivity good digital perfusion and is well oriented 3     Assessment:     Long-term diabetic with distal mycotic nail infections with pain bilateral and no other pathology    Plan:     H&P diabetic education rendered and debrided nailbeds 1-5 both feet with no iatrogenic bleeding noted and reappoint for routine care or earlier if needed

## 2015-11-18 NOTE — Progress Notes (Signed)
   Subjective:    Patient ID: Allison Caldwell, female    DOB: July 29, 1946, 70 y.o.   MRN: [id number]  HPI Patient presents with bilateral nail problem; nail discoloration. All nails need to be checked for nail fungus.  Patient also presents with Bilateral nail trim; pt diabetic type 2; sugar=did not check today; A1C=7.0  Patient also presents with needing to get diabetic shoes.   Review of Systems  Neurological: Positive for dizziness.  All other systems reviewed and are negative.      Objective:   Physical Exam        Assessment & Plan:

## 2015-11-19 ENCOUNTER — Telehealth: Payer: Self-pay | Admitting: Family Medicine

## 2015-11-19 DIAGNOSIS — E785 Hyperlipidemia, unspecified: Secondary | ICD-10-CM

## 2015-11-19 DIAGNOSIS — F341 Dysthymic disorder: Secondary | ICD-10-CM

## 2015-11-19 NOTE — Telephone Encounter (Signed)
Patient needs medication refills....Allison KitchenMarland KitchenPravastatin, Zoloft.....Allison Kitchenand has would like to know if she has to be taking those medications

## 2015-11-20 MED ORDER — SERTRALINE HCL 100 MG PO TABS: 100.0000 mg | ORAL_TABLET | Freq: Every morning | ORAL | Status: DC

## 2015-11-20 MED ORDER — PRAVASTATIN SODIUM 20 MG PO TABS: 20.0000 mg | ORAL_TABLET | Freq: Every day | ORAL | Status: DC

## 2015-11-20 NOTE — Telephone Encounter (Signed)
Pt notified Rx at [facility]  Pt advised to schedule F/U with  PCP

## 2015-11-20 NOTE — Telephone Encounter (Signed)
Both meds refilled She should f/u to assess her mood and her cholesterol to determine if both meds are still needed at current doses

## 2015-12-25 ENCOUNTER — Telehealth: Payer: Self-pay | Admitting: *Deleted

## 2015-12-25 ENCOUNTER — Other Ambulatory Visit: Payer: Self-pay | Admitting: *Deleted

## 2015-12-25 DIAGNOSIS — E119 Type 2 diabetes mellitus without complications: Secondary | ICD-10-CM

## 2015-12-25 DIAGNOSIS — E785 Hyperlipidemia, unspecified: Secondary | ICD-10-CM

## 2015-12-25 DIAGNOSIS — I10 Essential (primary) hypertension: Secondary | ICD-10-CM

## 2015-12-25 MED ORDER — DILTIAZEM HCL ER BEADS 420 MG PO CP24: 420.0000 mg | ORAL_CAPSULE | Freq: Every day | ORAL | Status: DC

## 2015-12-25 MED ORDER — GLIPIZIDE 5 MG PO TABS: 5.0000 mg | ORAL_TABLET | Freq: Every day | ORAL | Status: DC

## 2015-12-25 MED ORDER — PRAVASTATIN SODIUM 20 MG PO TABS: 20.0000 mg | ORAL_TABLET | Freq: Every day | ORAL | Status: DC

## 2015-12-25 MED ORDER — OLMESARTAN MEDOXOMIL 20 MG PO TABS: 20.0000 mg | ORAL_TABLET | Freq: Every day | ORAL | Status: DC

## 2015-12-25 MED ORDER — SERTRALINE HCL 100 MG PO TABS: 100.0000 mg | ORAL_TABLET | Freq: Every morning | ORAL | Status: DC

## 2015-12-25 MED ORDER — METFORMIN HCL 500 MG PO TABS: 500.0000 mg | ORAL_TABLET | Freq: Two times a day (BID) | ORAL | Status: DC

## 2015-12-25 NOTE — Telephone Encounter (Signed)
Rx refills fax to [facility] Fax# [phone]

## 2016-01-30 ENCOUNTER — Emergency Department (HOSPITAL_COMMUNITY): Payer: Commercial Managed Care - HMO

## 2016-01-30 ENCOUNTER — Emergency Department (HOSPITAL_COMMUNITY)
Admission: EM | Admit: 2016-01-30 | Discharge: 2016-01-30 | Disposition: A | Discharge status: Home / Self Care | Payer: Commercial Managed Care - HMO | Attending: Emergency Medicine | Admitting: Emergency Medicine

## 2016-01-30 ENCOUNTER — Encounter (HOSPITAL_COMMUNITY): Payer: Self-pay

## 2016-01-30 DIAGNOSIS — F329 Major depressive disorder, single episode, unspecified: Secondary | ICD-10-CM | POA: Insufficient documentation

## 2016-01-30 DIAGNOSIS — M1990 Unspecified osteoarthritis, unspecified site: Secondary | ICD-10-CM | POA: Diagnosis not present

## 2016-01-30 DIAGNOSIS — Z7982 Long term (current) use of aspirin: Secondary | ICD-10-CM | POA: Insufficient documentation

## 2016-01-30 DIAGNOSIS — Z87891 Personal history of nicotine dependence: Secondary | ICD-10-CM | POA: Diagnosis not present

## 2016-01-30 DIAGNOSIS — F419 Anxiety disorder, unspecified: Secondary | ICD-10-CM | POA: Insufficient documentation

## 2016-01-30 DIAGNOSIS — Z79899 Other long term (current) drug therapy: Secondary | ICD-10-CM | POA: Insufficient documentation

## 2016-01-30 DIAGNOSIS — Z880 Allergy status to penicillin: Secondary | ICD-10-CM | POA: Diagnosis not present

## 2016-01-30 DIAGNOSIS — Z8673 Personal history of transient ischemic attack (TIA), and cerebral infarction without residual deficits: Secondary | ICD-10-CM | POA: Insufficient documentation

## 2016-01-30 DIAGNOSIS — R42 Dizziness and giddiness: Secondary | ICD-10-CM | POA: Diagnosis not present

## 2016-01-30 DIAGNOSIS — Z7984 Long term (current) use of oral hypoglycemic drugs: Secondary | ICD-10-CM | POA: Diagnosis not present

## 2016-01-30 DIAGNOSIS — I10 Essential (primary) hypertension: Secondary | ICD-10-CM | POA: Diagnosis not present

## 2016-01-30 DIAGNOSIS — E119 Type 2 diabetes mellitus without complications: Secondary | ICD-10-CM | POA: Diagnosis not present

## 2016-01-30 DIAGNOSIS — R404 Transient alteration of awareness: Secondary | ICD-10-CM | POA: Diagnosis not present

## 2016-01-30 LAB — URINALYSIS, ROUTINE W REFLEX MICROSCOPIC
Bilirubin Urine: NEGATIVE
Glucose, UA: NEGATIVE mg/dL
Hgb urine dipstick: NEGATIVE
Ketones, ur: NEGATIVE mg/dL
Leukocytes, UA: NEGATIVE
Nitrite: POSITIVE — AB
Protein, ur: 30 mg/dL — AB
Specific Gravity, Urine: 1.016 (ref 1.005–1.030)
pH: 6 (ref 5.0–8.0)

## 2016-01-30 LAB — URINE MICROSCOPIC-ADD ON

## 2016-01-30 LAB — CBC
HCT: 43.9 % (ref 36.0–46.0)
Hemoglobin: 14.7 g/dL (ref 12.0–15.0)
MCH: 30.6 pg (ref 26.0–34.0)
MCHC: 33.5 g/dL (ref 30.0–36.0)
MCV: 91.5 fL (ref 78.0–100.0)
Platelets: 123 10*3/uL — ABNORMAL LOW (ref 150–400)
RBC: 4.8 MIL/uL (ref 3.87–5.11)
RDW: 14.8 % (ref 11.5–15.5)
WBC: 5.9 10*3/uL (ref 4.0–10.5)

## 2016-01-30 LAB — BASIC METABOLIC PANEL
Anion gap: 12 (ref 5–15)
BUN: 7 mg/dL (ref 6–20)
CO2: 27 mmol/L (ref 22–32)
Calcium: 9.7 mg/dL (ref 8.9–10.3)
Chloride: 102 mmol/L (ref 101–111)
Creatinine, Ser: 0.59 mg/dL (ref 0.44–1.00)
GFR calc Af Amer: >60 mL/min (ref >60)
GFR calc non Af Amer: >60 mL/min (ref >60)
Glucose, Bld: 178 mg/dL — ABNORMAL HIGH (ref 65–99)
Potassium: 3 mmol/L — ABNORMAL LOW (ref 3.5–5.1)
Sodium: 141 mmol/L (ref 135–145)

## 2016-01-30 LAB — I-STAT TROPONIN, ED: Troponin i, poc: 0 ng/mL (ref 0.00–0.08)

## 2016-01-30 MED ORDER — POTASSIUM CHLORIDE CRYS ER 20 MEQ PO TBCR
40.0000 meq | EXTENDED_RELEASE_TABLET | Freq: Once | ORAL | Status: AC
  Administered 2016-01-30: 40 meq via ORAL
  Filled 2016-01-30: qty 2

## 2016-01-30 MED ORDER — DILTIAZEM HCL ER BEADS 300 MG PO CP24
420.0000 mg | ORAL_CAPSULE | ORAL | Status: DC
  Administered 2016-01-30: 420 mg via ORAL
  Filled 2016-01-30: qty 1

## 2016-01-30 MED ORDER — CEPHALEXIN 500 MG PO CAPS: 500.0000 mg | ORAL_CAPSULE | Freq: Two times a day (BID) | ORAL | Status: DC

## 2016-01-30 NOTE — Discharge Instructions (Signed)
Allison Caldwell,  [place] meeting you! Please follow-up with your primary care provider within one week. Return to the emergency department if you develop increased dizziness, chest pain, shortness of breath. Feel better soon!  S. Wendie Simmer, PA-C Dizziness Dizziness is a common problem. It is a feeling of unsteadiness or light-headedness. You may feel like you are about to faint. Dizziness can lead to injury if you stumble or fall. Anyone can become dizzy, but dizziness is more common in older adults. This condition can be caused by a number of things, including medicines, dehydration, or illness. HOME CARE INSTRUCTIONS Taking these steps may help with your condition: Eating and Drinking  Drink enough fluid to keep your urine clear or pale yellow. This helps to keep you from becoming dehydrated. Try to drink more clear fluids, such as water.  Do not drink alcohol.  Limit your caffeine intake if directed by your health care provider.  Limit your salt intake if directed by your health care provider. Activity  Avoid making quick movements.  Rise slowly from chairs and steady yourself until you feel okay.  In the morning, first sit up on the side of the bed. When you feel okay, stand slowly while you hold onto something until you know that your balance is fine.  Move your legs often if you need to stand in one place for a long time. Tighten and relax your muscles in your legs while you are standing.  Do not drive or operate heavy machinery if you feel dizzy.  Avoid bending down if you feel dizzy. Place items in your home so that they are easy for you to reach without leaning over. Lifestyle  Do not use any tobacco products, including cigarettes, chewing tobacco, or electronic cigarettes. If you need help quitting, ask your health care provider.  Try to reduce your stress level, such as with yoga or meditation. Talk with your health care provider if you need help. General  Instructions  Watch your dizziness for any changes.  Take medicines only as directed by your health care provider. Talk with your health care provider if you think that your dizziness is caused by a medicine that you are taking.  Tell a friend or a family member that you are feeling dizzy. If he or she notices any changes in your behavior, have this person call your health care provider.  Keep all follow-up visits as directed by your health care provider. This is important. SEEK MEDICAL CARE IF:  Your dizziness does not go away.  Your dizziness or light-headedness gets worse.  You feel nauseous.  You have reduced hearing.  You have new symptoms.  You are unsteady on your feet or you feel like the room is spinning. SEEK IMMEDIATE MEDICAL CARE IF:  You vomit or have diarrhea and are unable to eat or drink anything.  You have problems talking, walking, swallowing, or using your arms, hands, or legs.  You feel generally weak.  You are not thinking clearly or you have trouble forming sentences. It may take a friend or family member to notice this.  You have chest pain, abdominal pain, shortness of breath, or sweating.  Your vision changes.  You notice any bleeding.  You have a headache.  You have neck pain or a stiff neck.  You have a fever.   This information is not intended to replace advice given to you by your health care provider. Make sure you discuss any questions you have with  your health care provider.   Document Released: 04/12/2001 Document Revised: 03/03/2015 Document Reviewed: 10/13/2014 Elsevier Interactive Patient Education [employer].

## 2016-01-30 NOTE — ED Provider Notes (Signed)
CSN: [id number]     Arrival date & time 01/30/16  0645 History   First MD Initiated Contact with Patient 01/30/16 0703     Chief Complaint  Patient presents with  . Dizziness   HPI Allison Caldwell is a 70 y.o. female PMH significant for HTN, DM, cardiomyopathy, TIA presenting with two episodes of "dizziness." She describes the dizziness as nonvertiginous, not a lightheaded feeling, difficult to describe other than "feeling funny." She denies ever having this previously. She states the first episode began after she went to the bathroom and was trying to get back into bed. She states she felt like she was going to pass out. Shortly after that she went to the kitchen where she had another "dizzy spell" and then she broke out into sweats. She endorses an episode of nausea. While evaluating the patient, she states she had another dizzy spell which lasted a few seconds. She denies fevers, chills, CP, SOB, emesis, HA, change in bowel/bladder habits.  Of note, she endorses a URI 2 weeks ago.   Past Medical History  Diagnosis Date  . Hypertension   . Diabetes mellitus     NIDDM  . Obesities, morbid ([place])   . TIA (transient ischemic attack)   . Depression   . Degenerative joint disease   . ETOH abuse   . Cardiomyopathy   . Anxiety    Past Surgical History  Procedure Laterality Date  . Orif ankle fracture      right  . Cesarean section      times 2  . Eye surgery      laser - right eye 2010  . Left wrist      surgical repair   Family History  Problem Relation Age of Onset  . Heart disease Mother   . Cancer Father   . Diabetes Sister   . Kidney disease Brother   . Stroke Brother    Social History  Substance Use Topics  . Smoking status: Former Smoker    Types: Cigarettes  . Smokeless tobacco: None     Comment: Quit x 2 yrrs.  . Alcohol Use: 1.8 oz/week    3 Cans of beer per week     Comment: beer/vodka.   OB History    No data available     Review of Systems  Ten  systems are reviewed and are negative for acute change except as noted in the HPI  Allergies  Ace inhibitors and Penicillins  Home Medications   Prior to Admission medications   Medication Sig Start Date End Date Taking? Authorizing Provider  aspirin EC 81 MG tablet Take 81 mg by mouth every morning.     Historical Provider, MD  diltiazem (TIAZAC) 420 MG 24 hr capsule Take 1 capsule (420 mg total) by mouth daily. 12/25/15   Josalyn Funches, MD  glipiZIDE (GLUCOTROL) 5 MG tablet Take 1 tablet (5 mg total) by mouth daily before breakfast. 12/25/15   Boykin Nearing, MD  metFORMIN (GLUCOPHAGE) 500 MG tablet Take 1 tablet (500 mg total) by mouth 2 (two) times daily with a meal. 12/25/15   Boykin Nearing, MD  Multiple Vitamin (MULITIVITAMIN WITH MINERALS) TABS Take 1 tablet by mouth daily. 11/30/11   Clarene Duke, MD  olmesartan (BENICAR) 20 MG tablet Take 1 tablet (20 mg total) by mouth daily. 12/25/15   Josalyn Funches, MD  pravastatin (PRAVACHOL) 20 MG tablet Take 1 tablet (20 mg total) by mouth at bedtime. 12/25/15   Josalyn  Funches, MD  sertraline (ZOLOFT) 100 MG tablet Take 1 tablet (100 mg total) by mouth every morning. 12/25/15   Josalyn Funches, MD   Pulse 98  Temp(Src) 98 F (36.7 C) (Oral)  Resp 19  Ht 5\' 2"  (1.575 m)  Wt 119.75 kg  BMI 48.27 kg/m2  SpO2 99% Physical Exam  Constitutional: She is oriented to person, place, and time. She appears well-developed and well-nourished. No distress.  Obese  HENT:  Head: Normocephalic and atraumatic.  Right Ear: External ear normal.  Left Ear: External ear normal.  Nose: Nose normal.  Mouth/Throat: Oropharynx is clear and moist. No oropharyngeal exudate.  Cerumen blocking tympanic membrane the bilateral ears. Poor dentition  Eyes: Conjunctivae and EOM are normal. Pupils are equal, round, and reactive to light. Right eye exhibits no discharge. Left eye exhibits no discharge. No scleral icterus.  Neck: Normal range of motion. Neck  supple. No tracheal deviation present.  Cardiovascular: Normal rate, regular rhythm, normal heart sounds and intact distal pulses.  Exam reveals no gallop and no friction rub.   No murmur heard. Pulmonary/Chest: Effort normal and breath sounds normal. No respiratory distress. She has no wheezes. She has no rales. She exhibits no tenderness.  Abdominal: Soft. Bowel sounds are normal. She exhibits no distension and no mass. There is no tenderness. There is no rebound and no guarding.  Musculoskeletal: Normal range of motion. She exhibits no edema or tenderness.  Lymphadenopathy:    She has no cervical adenopathy.  Neurological: She is alert and oriented to person, place, and time. Coordination normal.  Cranial nerves II through XII intact. Normal pronator drift, rapid alternating movements, finger to nose.  Skin: Skin is warm and dry. No rash noted. She is not diaphoretic. No erythema.  Psychiatric: She has a normal mood and affect. Her behavior is normal.  Nursing note and vitals reviewed.   ED Course  Procedures  Labs Review Labs Reviewed  BASIC METABOLIC PANEL - Abnormal; Notable for the following:    Potassium 3.0 (*)    Glucose, Bld 178 (*)    All other components within normal limits  CBC - Abnormal; Notable for the following:    Platelets 123 (*)    All other components within normal limits  URINALYSIS, ROUTINE W REFLEX MICROSCOPIC (NOT AT [facility]) - Abnormal; Notable for the following:    APPearance CLOUDY (*)    Protein, ur 30 (*)    Nitrite POSITIVE (*)    All other components within normal limits  URINE MICROSCOPIC-ADD ON - Abnormal; Notable for the following:    Squamous Epithelial / LPF 6-30 (*)    Bacteria, UA MANY (*)    All other components within normal limits  CBG MONITORING, ED  Randolm Idol, ED   Imaging Review Dg Chest 2 View  01/30/2016  CLINICAL DATA:  Dizziness. EXAM: CHEST  2 VIEW COMPARISON:  September 21, 2013. FINDINGS: Stable cardiomegaly. No  pneumothorax or pleural effusion is noted. No acute pulmonary disease is noted. Bony thorax is intact. IMPRESSION: No active cardiopulmonary disease. Electronically Signed   By: Marijo Conception, M.D.   On: 01/30/2016 08:03   Ct Head Wo Contrast  01/30/2016  CLINICAL DATA:  Dizziness. EXAM: CT HEAD WITHOUT CONTRAST TECHNIQUE: Contiguous axial images were obtained from the base of the skull through the vertex without intravenous contrast. COMPARISON:  CT scan of February 28, 2015. FINDINGS: Bony calvarium appears intact. Mild diffuse cortical atrophy is noted. Mild chronic ischemic white matter disease  is noted. No mass effect or midline shift is noted. Ventricular size is within normal limits. There is no evidence of mass lesion, hemorrhage or acute infarction. IMPRESSION: Mild diffuse cortical atrophy. Mild chronic ischemic white matter disease. No acute intracranial abnormality seen. Electronically Signed   By: Marijo Conception, M.D.   On: 01/30/2016 08:48   I have personally reviewed and evaluated these images and lab results as part of my medical decision-making.   EKG Interpretation   Date/Time:  Saturday January 30 2016 08:44:48 EDT Ventricular Rate:  80 PR Interval:  174 QRS Duration: 99 QT Interval:  398 QTC Calculation: 459 R Axis:   -69 Text Interpretation:  Sinus rhythm Left anterior fascicular block Abnormal  R-wave progression, late transition Borderline T abnormalities, diffuse  leads No significant change since last tracing Confirmed by KNAPP  MD-J,  JON [id number]) on 01/30/2016 8:50:00 AM      MDM   Final diagnoses:  Dizziness   Patient nontoxic appearing. Hypertensive but did not take home HTN med today. Will perform broad workup for infectious vs neuro vs cardiac.  UA borderline UTI with nitrite positive urine; however, 6-30 squamous epithelial, many bacteria.  BMP with hypokalemia of 3, hyperglycemia of 178. CBC unremarkable. Troponin unremarkable. CT head, chest x-ray, EKG  without acute abnormality.  Upon reassessment, patient well appearing. She is requesting food. She denies having dizziness in the ED. Per prior record review, this may be a chronic issue due to medication.  Will give K supplement. Will treat borderline UTI, as patient did note to have foul smelling urine and drinking cranberry juice to alleviate this.  Discussed case with Dr. Tomi Bamberger who agrees with above plan and plan to discharge. Patient may be safely discharged home. Discussed reasons for return. Patient to follow-up with primary care provider within one week. Patient in understanding and agreement with the plan.   Dahlen Lions, PA-C 01/30/16 1112  Dorie Rank, MD 01/30/16 1136

## 2016-01-30 NOTE — ED Notes (Signed)
Pt ambulated with cane to bathroom with no dizziness and no difficulty.  Stand-by assist.

## 2016-01-30 NOTE — ED Notes (Signed)
Pt states that she woke up this dizzy this morning, pt states feels like going to pass out dizzy. No syncopal episode, no LOC.

## 2016-02-23 ENCOUNTER — Ambulatory Visit: Payer: Commercial Managed Care - HMO | Admitting: Podiatry

## 2016-03-31 ENCOUNTER — Other Ambulatory Visit: Payer: Self-pay | Admitting: Family Medicine

## 2016-04-15 ENCOUNTER — Telehealth: Payer: Self-pay | Admitting: Family Medicine

## 2016-04-15 NOTE — Telephone Encounter (Signed)
Pt. Called stating that she had received a called to reschedule her appointment that she had on 04/22/16.  Pt. Was rescheduled for 05/05/16 and would like to know if she can get something for her hip pain b/c her  Appointment had been rescheduled. Please f/u with pt.

## 2016-04-22 ENCOUNTER — Ambulatory Visit: Payer: Commercial Managed Care - HMO | Admitting: Family Medicine

## 2016-04-27 ENCOUNTER — Other Ambulatory Visit: Payer: Self-pay | Admitting: Family Medicine

## 2016-04-30 DIAGNOSIS — H5203 Hypermetropia, bilateral: Secondary | ICD-10-CM | POA: Diagnosis not present

## 2016-04-30 DIAGNOSIS — H52209 Unspecified astigmatism, unspecified eye: Secondary | ICD-10-CM | POA: Diagnosis not present

## 2016-04-30 DIAGNOSIS — H524 Presbyopia: Secondary | ICD-10-CM | POA: Diagnosis not present

## 2016-04-30 DIAGNOSIS — Z0100 Encounter for examination of eyes and vision without abnormal findings: Secondary | ICD-10-CM | POA: Diagnosis not present

## 2016-05-05 ENCOUNTER — Encounter: Payer: Self-pay | Admitting: Family Medicine

## 2016-05-05 ENCOUNTER — Other Ambulatory Visit: Payer: Self-pay | Admitting: Family Medicine

## 2016-05-05 ENCOUNTER — Ambulatory Visit: Payer: Commercial Managed Care - HMO | Attending: Family Medicine | Admitting: Family Medicine

## 2016-05-05 VITALS — BP 147/97 | HR 83 | Temp 98.4°F | Resp 12 | Ht 62.0 in | Wt 276.6 lb

## 2016-05-05 DIAGNOSIS — Z7984 Long term (current) use of oral hypoglycemic drugs: Secondary | ICD-10-CM | POA: Insufficient documentation

## 2016-05-05 DIAGNOSIS — L80 Vitiligo: Secondary | ICD-10-CM | POA: Diagnosis not present

## 2016-05-05 DIAGNOSIS — Z79899 Other long term (current) drug therapy: Secondary | ICD-10-CM | POA: Insufficient documentation

## 2016-05-05 DIAGNOSIS — F341 Dysthymic disorder: Secondary | ICD-10-CM | POA: Diagnosis not present

## 2016-05-05 DIAGNOSIS — M25551 Pain in right hip: Secondary | ICD-10-CM | POA: Diagnosis not present

## 2016-05-05 DIAGNOSIS — Z87891 Personal history of nicotine dependence: Secondary | ICD-10-CM | POA: Insufficient documentation

## 2016-05-05 DIAGNOSIS — Z1159 Encounter for screening for other viral diseases: Secondary | ICD-10-CM | POA: Diagnosis not present

## 2016-05-05 DIAGNOSIS — M25561 Pain in right knee: Secondary | ICD-10-CM

## 2016-05-05 DIAGNOSIS — E785 Hyperlipidemia, unspecified: Secondary | ICD-10-CM | POA: Diagnosis not present

## 2016-05-05 DIAGNOSIS — E119 Type 2 diabetes mellitus without complications: Secondary | ICD-10-CM | POA: Diagnosis not present

## 2016-05-05 DIAGNOSIS — Z7982 Long term (current) use of aspirin: Secondary | ICD-10-CM | POA: Insufficient documentation

## 2016-05-05 DIAGNOSIS — F418 Other specified anxiety disorders: Secondary | ICD-10-CM | POA: Insufficient documentation

## 2016-05-05 DIAGNOSIS — M545 Low back pain, unspecified: Secondary | ICD-10-CM

## 2016-05-05 DIAGNOSIS — I10 Essential (primary) hypertension: Secondary | ICD-10-CM

## 2016-05-05 DIAGNOSIS — R6889 Other general symptoms and signs: Secondary | ICD-10-CM | POA: Diagnosis not present

## 2016-05-05 DIAGNOSIS — N289 Disorder of kidney and ureter, unspecified: Secondary | ICD-10-CM

## 2016-05-05 LAB — GLUCOSE, POCT (MANUAL RESULT ENTRY): POC Glucose: 137 mg/dl — AB (ref 70–99)

## 2016-05-05 LAB — POCT GLYCOSYLATED HEMOGLOBIN (HGB A1C): Hemoglobin A1C: 7.8

## 2016-05-05 MED ORDER — ACETAMINOPHEN ER 650 MG PO TBCR: 650.0000 mg | EXTENDED_RELEASE_TABLET | Freq: Three times a day (TID) | ORAL | Status: DC | PRN

## 2016-05-05 MED ORDER — ADULT MULTIVITAMIN W/MINERALS CH: 1.0000 | ORAL_TABLET | Freq: Every day | ORAL | Status: AC

## 2016-05-05 MED ORDER — NAPROXEN SODIUM 220 MG PO TABS: 220.0000 mg | ORAL_TABLET | Freq: Two times a day (BID) | ORAL | Status: DC

## 2016-05-05 MED ORDER — PRAVASTATIN SODIUM 20 MG PO TABS: 20.0000 mg | ORAL_TABLET | Freq: Every day | ORAL | Status: DC

## 2016-05-05 MED ORDER — TRIAMCINOLONE ACETONIDE 0.1 % EX CREA: 1.0000 "application " | TOPICAL_CREAM | Freq: Two times a day (BID) | CUTANEOUS | Status: DC

## 2016-05-05 MED ORDER — ASPIRIN EC 81 MG PO TBEC: 81.0000 mg | DELAYED_RELEASE_TABLET | Freq: Every morning | ORAL | Status: DC

## 2016-05-05 MED ORDER — OLMESARTAN MEDOXOMIL 20 MG PO TABS: 20.0000 mg | ORAL_TABLET | Freq: Every day | ORAL | Status: DC

## 2016-05-05 MED ORDER — METFORMIN HCL 500 MG PO TABS: ORAL_TABLET | ORAL | Status: DC

## 2016-05-05 MED ORDER — DILTIAZEM HCL ER BEADS 420 MG PO CP24: 420.0000 mg | ORAL_CAPSULE | Freq: Every day | ORAL | Status: DC

## 2016-05-05 MED ORDER — SERTRALINE HCL 100 MG PO TABS: 100.0000 mg | ORAL_TABLET | Freq: Every morning | ORAL | Status: DC

## 2016-05-05 MED ORDER — GLIPIZIDE 5 MG PO TABS: 5.0000 mg | ORAL_TABLET | Freq: Every day | ORAL | Status: DC

## 2016-05-05 NOTE — Progress Notes (Signed)
Subjective:  Patient ID: Allison Caldwell, female    DOB: Sep 03, 1946  Age: 70 y.o. MRN: [id number]  CC: Hip Pain   HPI Allison Caldwell has HTN, diabetes, obesity, chronic low back pain  presents for    1. R hip pain: R sided low back and hip pain for several years. She is pain free today. No recent falls. She takes aleve OTC for hip pain. She does not exercise. She eats a high sugar diet. In addition to hip pain she also has knee pain. She lives alone. Pain interferes with mobility. She walks with a cane.   Social History  Substance Use Topics  . Smoking status: Former Smoker    Types: Cigarettes  . Smokeless tobacco: Not on file     Comment: Quit x 2 yrrs.  . Alcohol Use: No    Outpatient Prescriptions Prior to Visit  Medication Sig Dispense Refill  . aspirin EC 81 MG tablet Take 81 mg by mouth every morning.     . diltiazem (TIAZAC) 420 MG 24 hr capsule Take 1 capsule (420 mg total) by mouth daily. Needs OV for refills 30 capsule 0  . glipiZIDE (GLUCOTROL) 5 MG tablet Take 1 tablet (5 mg total) by mouth daily before breakfast. Needs OV for refills 30 tablet 0  . metFORMIN (GLUCOPHAGE) 500 MG tablet TAKE 1 TABLET TWICE DAILY WITH A MEAL (NEED OFFICE VISIT FOR REFILLS) 60 tablet 0  . Multiple Vitamin (MULITIVITAMIN WITH MINERALS) TABS Take 1 tablet by mouth daily.    Marland Kitchen olmesartan (BENICAR) 20 MG tablet Take 1 tablet (20 mg total) by mouth daily. Needs OV for refills 30 tablet 0  . pravastatin (PRAVACHOL) 20 MG tablet Take 1 tablet (20 mg total) by mouth at bedtime. 90 tablet 3  . sertraline (ZOLOFT) 100 MG tablet Take 1 tablet (100 mg total) by mouth every morning. Needs OV for refills 30 tablet 0  . cephALEXin (KEFLEX) 500 MG capsule Take 1 capsule (500 mg total) by mouth 2 (two) times daily. (Patient not taking: Reported on 05/05/2016) 10 capsule 0   No facility-administered medications prior to visit.    ROS Review of Systems  Constitutional: Negative for fever and  chills.  Eyes: Negative for visual disturbance.  Respiratory: Negative for shortness of breath.   Cardiovascular: Negative for chest pain.  Gastrointestinal: Negative for abdominal pain and blood in stool.  Musculoskeletal: Positive for back pain and arthralgias.  Skin: Positive for color change. Negative for rash.  Allergic/Immunologic: Negative for immunocompromised state.  Hematological: Negative for adenopathy. Does not bruise/bleed easily.  Psychiatric/Behavioral: Negative for suicidal ideas and dysphoric mood.    Objective:  BP 147/97 mmHg  Pulse 83  Temp(Src) 98.4 F (36.9 C) (Oral)  Resp 12  Ht 5\' 2"  (1.575 m)  Wt 276 lb 9.6 oz (125.465 kg)  BMI 50.58 kg/m2  SpO2 95%  BP/Weight 05/05/2016 01/30/2016 [id number]  Systolic BP [id number]  Diastolic BP 97 73 97  Wt. (Lbs) 276.6 264 266  BMI 50.58 48.27 48.64    Physical Exam  Constitutional: She is oriented to person, place, and time. She appears well-developed and well-nourished. No distress.  Morbid obesity   HENT:  Head: Normocephalic and atraumatic.  Cardiovascular: Normal rate, regular rhythm, normal heart sounds and intact distal pulses.   Pulmonary/Chest: Effort normal and breath sounds normal.  Musculoskeletal: She exhibits no edema.  Exam difficult as patient unable to climb onto exam table.  No skin lesions No  tenderness. Pain is overly R posterior lateral thigh.  No joint swelling   Neurological: She is alert and oriented to person, place, and time.  Skin: Skin is warm and dry. No rash noted.     Psychiatric: She has a normal mood and affect.    Lab Results  Component Value Date   HGBA1C 7.10 09/28/2015   Lab Results  Component Value Date   HGBA1C 7.8 05/05/2016    CBG 137  Assessment & Plan:   There are no diagnoses linked to this encounter. [name] was seen today for hip pain.  Diagnoses and all orders for this visit:  HLD (hyperlipidemia) -     pravastatin (PRAVACHOL) 20 MG tablet; Take 1  tablet (20 mg total) by mouth at bedtime. -     Lipid Panel  Controlled type 2 diabetes mellitus without complication, unspecified long term insulin use status (HCC) -     aspirin EC 81 MG tablet; Take 1 tablet (81 mg total) by mouth every morning. -     glipiZIDE (GLUCOTROL) 5 MG tablet; Take 1 tablet (5 mg total) by mouth daily before breakfast. -     metFORMIN (GLUCOPHAGE) 500 MG tablet; TAKE 1 TABLET TWICE DAILY WITH A MEAL -     Multiple Vitamin (MULTIVITAMIN WITH MINERALS) TABS tablet; Take 1 tablet by mouth daily. -     HgB A1c -     Glucose (CBG)  HYPERTENSION, BENIGN -     aspirin EC 81 MG tablet; Take 1 tablet (81 mg total) by mouth every morning. -     diltiazem (TIAZAC) 420 MG 24 hr capsule; Take 1 capsule (420 mg total) by mouth daily. -     olmesartan (BENICAR) 20 MG tablet; Take 1 tablet (20 mg total) by mouth daily. -     BASIC METABOLIC PANEL WITH GFR -     Magnesium  Hyperlipemia -     pravastatin (PRAVACHOL) 20 MG tablet; Take 1 tablet (20 mg total) by mouth at bedtime.  ANXIETY DEPRESSION -     sertraline (ZOLOFT) 100 MG tablet; Take 1 tablet (100 mg total) by mouth every morning.  Right knee pain -     acetaminophen (TYLENOL 8 HOUR) 650 MG CR tablet; Take 1 tablet (650 mg total) by mouth every 8 (eight) hours as needed for pain. -     naproxen sodium (ALEVE) 220 MG tablet; Take 1 tablet (220 mg total) by mouth 2 (two) times daily with a meal.  Right-sided low back pain without sciatica -     acetaminophen (TYLENOL 8 HOUR) 650 MG CR tablet; Take 1 tablet (650 mg total) by mouth every 8 (eight) hours as needed for pain. -     naproxen sodium (ALEVE) 220 MG tablet; Take 1 tablet (220 mg total) by mouth 2 (two) times daily with a meal.  Vitiligo -     triamcinolone cream (KENALOG) 0.1 %; Apply 1 application topically 2 (two) times daily. Apply to light areas on face  Need for hepatitis C screening test -     Hepatitis C antibody, reflex  Other orders -      Hepatitis C antibody   Meds ordered this encounter  Medications  . aspirin EC 81 MG tablet    Sig: Take 1 tablet (81 mg total) by mouth every morning.    Dispense:  90 tablet    Refill:  3  . diltiazem (TIAZAC) 420 MG 24 hr capsule    Sig: Take  1 capsule (420 mg total) by mouth daily.    Dispense:  90 capsule    Refill:  3  . glipiZIDE (GLUCOTROL) 5 MG tablet    Sig: Take 1 tablet (5 mg total) by mouth daily before breakfast.    Dispense:  90 tablet    Refill:  3  . metFORMIN (GLUCOPHAGE) 500 MG tablet    Sig: TAKE 1 TABLET TWICE DAILY WITH A MEAL    Dispense:  180 tablet    Refill:  3  . Multiple Vitamin (MULTIVITAMIN WITH MINERALS) TABS tablet    Sig: Take 1 tablet by mouth daily.    Dispense:  90 tablet    Refill:  3  . olmesartan (BENICAR) 20 MG tablet    Sig: Take 1 tablet (20 mg total) by mouth daily.    Dispense:  90 tablet    Refill:  3  . pravastatin (PRAVACHOL) 20 MG tablet    Sig: Take 1 tablet (20 mg total) by mouth at bedtime.    Dispense:  90 tablet    Refill:  3  . sertraline (ZOLOFT) 100 MG tablet    Sig: Take 1 tablet (100 mg total) by mouth every morning.    Dispense:  90 tablet    Refill:  1  . acetaminophen (TYLENOL 8 HOUR) 650 MG CR tablet    Sig: Take 1 tablet (650 mg total) by mouth every 8 (eight) hours as needed for pain.  . naproxen sodium (ALEVE) 220 MG tablet    Sig: Take 1 tablet (220 mg total) by mouth 2 (two) times daily with a meal.  . triamcinolone cream (KENALOG) 0.1 %    Sig: Apply 1 application topically 2 (two) times daily. Apply to light areas on face    Dispense:  60 g    Refill:  0    Follow-up: No Follow-up on file.   Boykin Nearing MD

## 2016-05-05 NOTE — Patient Instructions (Addendum)
Allison Caldwell was seen today for hip pain.  Diagnoses and all orders for this visit:  HLD (hyperlipidemia) -     pravastatin (PRAVACHOL) 20 MG tablet; Take 1 tablet (20 mg total) by mouth at bedtime.  Controlled type 2 diabetes mellitus without complication, unspecified long term insulin use status (HCC) -     aspirin EC 81 MG tablet; Take 1 tablet (81 mg total) by mouth every morning. -     glipiZIDE (GLUCOTROL) 5 MG tablet; Take 1 tablet (5 mg total) by mouth daily before breakfast. -     metFORMIN (GLUCOPHAGE) 500 MG tablet; TAKE 1 TABLET TWICE DAILY WITH A MEAL -     Multiple Vitamin (MULTIVITAMIN WITH MINERALS) TABS tablet; Take 1 tablet by mouth daily.  HYPERTENSION, BENIGN -     aspirin EC 81 MG tablet; Take 1 tablet (81 mg total) by mouth every morning. -     diltiazem (TIAZAC) 420 MG 24 hr capsule; Take 1 capsule (420 mg total) by mouth daily. -     olmesartan (BENICAR) 20 MG tablet; Take 1 tablet (20 mg total) by mouth daily.  Hyperlipemia -     pravastatin (PRAVACHOL) 20 MG tablet; Take 1 tablet (20 mg total) by mouth at bedtime.  ANXIETY DEPRESSION -     sertraline (ZOLOFT) 100 MG tablet; Take 1 tablet (100 mg total) by mouth every morning.  Right knee pain -     acetaminophen (TYLENOL 8 HOUR) 650 MG CR tablet; Take 1 tablet (650 mg total) by mouth every 8 (eight) hours as needed for pain. -     naproxen sodium (ALEVE) 220 MG tablet; Take 1 tablet (220 mg total) by mouth 2 (two) times daily with a meal.  Right-sided low back pain without sciatica -     acetaminophen (TYLENOL 8 HOUR) 650 MG CR tablet; Take 1 tablet (650 mg total) by mouth every 8 (eight) hours as needed for pain. -     naproxen sodium (ALEVE) 220 MG tablet; Take 1 tablet (220 mg total) by mouth 2 (two) times daily with a meal.  Vitiligo -     triamcinolone cream (KENALOG) 0.1 %; Apply 1 application topically 2 (two) times daily. Apply to light areas on face   Please sign up for silver sneakers at your local  [employer]  F/u in 3 months for HTN and diabetes  Dr. Adrian Blackwater

## 2016-05-05 NOTE — Progress Notes (Signed)
Pt here for pain in R hip. Pt denies pain currently. Pt reports the pain has been present for a year now, but comes and go. Pt reports it hurts after standing for long periods and being active on it. Pt has taken medications today and has eaten breakfast. Pt needs a refill on all medications. Pt CBG is 137 and A1C is 7.8.

## 2016-05-06 ENCOUNTER — Telehealth: Payer: Self-pay

## 2016-05-06 ENCOUNTER — Emergency Department (HOSPITAL_COMMUNITY)
Admission: EM | Admit: 2016-05-06 | Discharge: 2016-05-06 | Disposition: A | Discharge status: Home / Self Care | Payer: Commercial Managed Care - HMO | Attending: Emergency Medicine | Admitting: Emergency Medicine

## 2016-05-06 ENCOUNTER — Encounter (HOSPITAL_COMMUNITY): Payer: Self-pay | Admitting: Emergency Medicine

## 2016-05-06 DIAGNOSIS — N390 Urinary tract infection, site not specified: Secondary | ICD-10-CM

## 2016-05-06 DIAGNOSIS — Z7984 Long term (current) use of oral hypoglycemic drugs: Secondary | ICD-10-CM | POA: Insufficient documentation

## 2016-05-06 DIAGNOSIS — I10 Essential (primary) hypertension: Secondary | ICD-10-CM | POA: Insufficient documentation

## 2016-05-06 DIAGNOSIS — R799 Abnormal finding of blood chemistry, unspecified: Secondary | ICD-10-CM | POA: Diagnosis present

## 2016-05-06 DIAGNOSIS — Z8673 Personal history of transient ischemic attack (TIA), and cerebral infarction without residual deficits: Secondary | ICD-10-CM | POA: Diagnosis not present

## 2016-05-06 DIAGNOSIS — E119 Type 2 diabetes mellitus without complications: Secondary | ICD-10-CM | POA: Insufficient documentation

## 2016-05-06 DIAGNOSIS — Z7982 Long term (current) use of aspirin: Secondary | ICD-10-CM | POA: Insufficient documentation

## 2016-05-06 DIAGNOSIS — Z87891 Personal history of nicotine dependence: Secondary | ICD-10-CM | POA: Diagnosis not present

## 2016-05-06 DIAGNOSIS — N289 Disorder of kidney and ureter, unspecified: Secondary | ICD-10-CM | POA: Insufficient documentation

## 2016-05-06 LAB — URINALYSIS, ROUTINE W REFLEX MICROSCOPIC
Glucose, UA: 100 mg/dL — AB
Hgb urine dipstick: NEGATIVE
Ketones, ur: 15 mg/dL — AB
Nitrite: POSITIVE — AB
Protein, ur: 30 mg/dL — AB
Specific Gravity, Urine: 1.029 (ref 1.005–1.030)
pH: 5.5 (ref 5.0–8.0)

## 2016-05-06 LAB — BASIC METABOLIC PANEL
Anion gap: 14 (ref 5–15)
BUN: 14 mg/dL (ref 6–20)
CO2: 25 mmol/L (ref 22–32)
Calcium: 9.4 mg/dL (ref 8.9–10.3)
Chloride: 102 mmol/L (ref 101–111)
Creatinine, Ser: 0.82 mg/dL (ref 0.44–1.00)
GFR calc Af Amer: >60 mL/min (ref >60)
GFR calc non Af Amer: >60 mL/min (ref >60)
Glucose, Bld: 172 mg/dL — ABNORMAL HIGH (ref 65–99)
Potassium: 3.4 mmol/L — ABNORMAL LOW (ref 3.5–5.1)
Sodium: 141 mmol/L (ref 135–145)

## 2016-05-06 LAB — CBC
HCT: 41.3 % (ref 36.0–46.0)
Hemoglobin: 13.8 g/dL (ref 12.0–15.0)
MCH: 30.5 pg (ref 26.0–34.0)
MCHC: 33.4 g/dL (ref 30.0–36.0)
MCV: 91.2 fL (ref 78.0–100.0)
Platelets: 264 10*3/uL (ref 150–400)
RBC: 4.53 MIL/uL (ref 3.87–5.11)
RDW: 14.1 % (ref 11.5–15.5)
WBC: 10.4 10*3/uL (ref 4.0–10.5)

## 2016-05-06 LAB — URINE MICROSCOPIC-ADD ON: RBC / HPF: NONE SEEN RBC/hpf (ref 0–5)

## 2016-05-06 LAB — HEPATITIS C ANTIBODY: HCV Ab: NEGATIVE

## 2016-05-06 MED ORDER — CEPHALEXIN 500 MG PO CAPS: 500.0000 mg | ORAL_CAPSULE | Freq: Three times a day (TID) | ORAL | Status: DC

## 2016-05-06 MED ORDER — DEXTROSE 5 % IV SOLN
1.0000 g | Freq: Once | INTRAVENOUS | Status: AC
  Administered 2016-05-06: 1 g via INTRAVENOUS
  Filled 2016-05-06: qty 10

## 2016-05-06 NOTE — Assessment & Plan Note (Signed)
A: CKD confirmed on screening labs with elevated Cr to 3, GFR 14, CO2 16. This is new as of 01/2016.  P: No NSAID Patient advised to go to ED for urgent evaluation

## 2016-05-06 NOTE — Telephone Encounter (Signed)
Unsuccessful Call attempt x 1:  Rn Left voice mail message requesting return call. Priscille Heidelberg, RN, BSN   Attempted to advise patient: Please call patient, needs ED evaluation of kidneys   Patient with severe kidney disease with Cr of 3, GFR of 14, CO2 of 16.   Patient advised to not take naproxen or any other NSAID, tylenol only for R hip, back and knee pain.   Patient asked to go to ED for evaluation of kidney disease   Hep C screen negative  Lipids normal

## 2016-05-06 NOTE — ED Provider Notes (Signed)
CSN: [id number]     Arrival date & time 05/06/16  1619 History   First MD Initiated Contact with Patient 05/06/16 2015     Chief Complaint  Patient presents with  . Abnormal Lab     (Consider location/radiation/quality/duration/timing/severity/associated sxs/prior Treatment) The history is provided by the patient.  Allison Caldwell is a 70 y.o. female hx of HTN, DM, TIA, ETOH abuse, Here presents with possible renal failure. She had follow-[facility] for a [facility] checkup. Her labs showed Cr 3.0 with a baseline Cr 1.0. Denies trouble urinating or shortness of breath or abdominal pain. Denies dysuria. Denies fever or chills    Past Medical History  Diagnosis Date  . Hypertension   . Diabetes mellitus     NIDDM  . Obesities, morbid ([place])   . TIA (transient ischemic attack)   . Depression   . Degenerative joint disease   . ETOH abuse   . Cardiomyopathy   . Anxiety    Past Surgical History  Procedure Laterality Date  . Orif ankle fracture      right  . Cesarean section      times 2  . Eye surgery      laser - right eye 2010  . Left wrist      surgical repair   Family History  Problem Relation Age of Onset  . Heart disease Mother   . Cancer Father   . Diabetes Sister   . Kidney disease Brother   . Stroke Brother    Social History  Substance Use Topics  . Smoking status: Former Smoker    Types: Cigarettes  . Smokeless tobacco: None     Comment: Quit x 2 yrrs.  . Alcohol Use: No   OB History    No data available     Review of Systems  Genitourinary: Negative for decreased urine volume.  All other systems reviewed and are negative.     Allergies  Ace inhibitors and Penicillins  Home Medications   Prior to Admission medications   Medication Sig Start Date End Date Taking? Authorizing Provider  acetaminophen (TYLENOL) 500 MG tablet Take 1,000 mg by mouth daily as needed for mild pain.   Yes  Historical Provider, MD  aspirin EC 81 MG tablet Take 1 tablet (81 mg total) by mouth every morning. 05/05/16  Yes Josalyn Funches, MD  diltiazem (TIAZAC) 420 MG 24 hr capsule Take 1 capsule (420 mg total) by mouth daily. 05/05/16  Yes Josalyn Funches, MD  glipiZIDE (GLUCOTROL) 5 MG tablet Take 1 tablet (5 mg total) by mouth daily before breakfast. 05/05/16  Yes Josalyn Funches, MD  metFORMIN (GLUCOPHAGE) 500 MG tablet TAKE 1 TABLET TWICE DAILY WITH A MEAL 05/05/16  Yes Josalyn Funches, MD  Multiple Vitamin (MULTIVITAMIN WITH MINERALS) TABS tablet Take 1 tablet by mouth daily. 05/05/16  Yes Josalyn Funches, MD  olmesartan (BENICAR) 20 MG tablet Take 1 tablet (20 mg total) by mouth daily. 05/05/16  Yes Josalyn Funches, MD  pravastatin (PRAVACHOL) 20 MG tablet Take 1 tablet (20 mg total) by mouth at bedtime. 05/05/16  Yes Josalyn Funches, MD  sertraline (ZOLOFT) 100 MG tablet Take 1 tablet (100 mg total) by mouth every morning. 05/05/16  Yes Josalyn Funches, MD  acetaminophen (TYLENOL 8 HOUR) 650 MG CR tablet Take 1 tablet (650 mg total) by mouth every 8 (eight) hours as needed for pain. 05/05/16   Boykin Nearing, MD  naproxen sodium (  ANAPROX) 220 MG tablet Take 220 mg by mouth 2 (two) times daily with a meal.    Historical Provider, MD  triamcinolone cream (KENALOG) 0.1 % Apply 1 application topically 2 (two) times daily. Apply to light areas on face 05/05/16   Josalyn Funches, MD   BP 158/87 mmHg  Pulse 79  Temp(Src) 98.7 F (37.1 C) (Oral)  Resp 18  SpO2 97% Physical Exam  Constitutional: She is oriented to person, place, and time. She appears well-developed and well-nourished.  HENT:  Head: Normocephalic.  Mouth/Throat: Oropharynx is clear and moist.  Eyes: Conjunctivae are normal. Pupils are equal, round, and reactive to light.  Neck: Normal range of motion. Neck supple.  Cardiovascular: Normal rate, regular rhythm and normal heart sounds.   Pulmonary/Chest: Effort normal and breath sounds normal. No  respiratory distress. She has no wheezes. She has no rales.  Abdominal: Soft. Bowel sounds are normal. She exhibits no distension. There is no tenderness. There is no rebound.  No CVAT   Musculoskeletal: Normal range of motion. She exhibits no edema or tenderness.  Neurological: She is alert and oriented to person, place, and time. No cranial nerve deficit. Coordination normal.  Skin: Skin is warm and dry.  Psychiatric: She has a normal mood and affect. Her behavior is normal. Judgment and thought content normal.  Nursing note and vitals reviewed.   ED Course  Procedures (including critical care time) Labs Review Labs Reviewed  BASIC METABOLIC PANEL - Abnormal; Notable for the following:    Potassium 3.4 (*)    Glucose, Bld 172 (*)    All other components within normal limits  URINALYSIS, ROUTINE W REFLEX MICROSCOPIC (NOT AT [facility]) - Abnormal; Notable for the following:    Color, Urine AMBER (*)    APPearance TURBID (*)    Glucose, UA 100 (*)    Bilirubin Urine SMALL (*)    Ketones, ur 15 (*)    Protein, ur 30 (*)    Nitrite POSITIVE (*)    Leukocytes, UA LARGE (*)    All other components within normal limits  URINE MICROSCOPIC-ADD ON - Abnormal; Notable for the following:    Squamous Epithelial / LPF 6-30 (*)    Bacteria, UA MANY (*)    Crystals CA OXALATE CRYSTALS (*)    All other components within normal limits  CBC    Imaging Review No results found. I have personally reviewed and evaluated these images and lab results as part of my medical decision-making.   EKG Interpretation None      MDM   Final diagnoses:  None   Allison Caldwell is a 70 y.o. female here with possible acute renal failure. Denies abdominal pain or trouble urinating or fevers. Will recheck chemistry and get UA.   9:40pm Cr 0.8. I wonder if Cr yesterday was a lab error. UA however showed + nitrate and leuk with many bacteria. Has no dysuria but given positive UA, given rocephin (has PCN  but had no reactions to rocephin in the ED), will dc home with keflex. Vitals stable. Will dc home.       Wandra Arthurs, MD 05/06/16 [phone]

## 2016-05-06 NOTE — Telephone Encounter (Signed)
-----   Message from Boykin Nearing, MD sent at 05/06/2016  9:12 AM EDT ----- Please call patient, needs ED evaluation of kidneys  Patient with severe kidney disease with Cr of 3, GFR of 14, CO2 of 16.  Patient advised to not take naproxen or any other NSAID, tylenol only for R hip, back and knee pain.  Patient asked to go to ED for evaluation of kidney disease  Hep C screen negative Lipids normal.

## 2016-05-06 NOTE — Telephone Encounter (Signed)
Pt. Returned call. Please f/u with pt. °

## 2016-05-06 NOTE — Telephone Encounter (Signed)
Return call to patient, Patient information verified per hipaa guidelines.  Patient advised per Dr. Adrian Blackwater:   Please call patient, needs ED evaluation of kidneys   Patient with severe kidney disease with Cr of 3, GFR of 14, CO2 of 16.   Patient advised to not take naproxen or any other NSAID, tylenol only for R hip, back and knee pain.   Patient asked to go to ED for evaluation of kidney disease   Hep C screen negative  Lipids normal         Patient verbalized understanding. Priscille Heidelberg, RN, BSN

## 2016-05-06 NOTE — Assessment & Plan Note (Signed)
A: vitiligo P: Topical steroid Cautioned patient to stop use of skin lightening worsens

## 2016-05-06 NOTE — ED Notes (Signed)
Pt sts ready for d/c.  Rocephin verified to be completed.  MD made aware.

## 2016-05-06 NOTE — Assessment & Plan Note (Signed)
Elevated BP Goal is < 140/90 as patient has diabetes Plan for labs Consider increase benicar

## 2016-05-06 NOTE — Assessment & Plan Note (Signed)
Diabetes remains well controlled Continue current regimen

## 2016-05-06 NOTE — ED Notes (Signed)
Pt states "I went to my doctor yesterday and they took blood work and they sent me here because i had kidney disease". Pt denies any symptoms, went for a regular checkup. Ambulatory with steady gait. Pt in NAD.

## 2016-05-06 NOTE — Discharge Instructions (Signed)
Take keflex three times daily for 5 days.   Your kidney function is normal today.   See your doctor in a week   Return to ER if you have vomiting, fever, abdominal pain, chest pain, shortness of breath

## 2016-05-06 NOTE — ED Notes (Signed)
This RN attempted IV x 2.  Will seek additional assistance.

## 2016-05-06 NOTE — Assessment & Plan Note (Signed)
Chronic pain in low back and knee Plan Advised increase exercise, recommended silver sneakers Scheduled tylenol Naproxen prn break through pain initially recommended but then patient advised to stop as she has new onset kidney disease noted on screening labs

## 2016-05-09 ENCOUNTER — Telehealth: Payer: Self-pay | Admitting: Family Medicine

## 2016-05-09 LAB — URINE CULTURE: Culture: >=100000 — AB

## 2016-05-09 LAB — LIPID PANEL
Cholesterol: 191 mg/dL (ref 125–200)
HDL: 103 mg/dL (ref >=46)
LDL Cholesterol: 74 mg/dL (ref <130)
Total CHOL/HDL Ratio: 1.9 Ratio (ref <=5.0)
Triglycerides: 72 mg/dL (ref <150)
VLDL: 14 mg/dL (ref <30)

## 2016-05-09 LAB — BASIC METABOLIC PANEL WITH GFR
BUN: 11 mg/dL (ref 7–25)
CO2: 16 mmol/L — ABNORMAL LOW (ref 20–31)
Calcium: 9 mg/dL (ref 8.6–10.4)
Chloride: 100 mmol/L (ref 98–110)
Creat: 0.5 mg/dL — ABNORMAL LOW (ref 0.60–0.93)
GFR, Est African American: >89 mL/min (ref >=60)
GFR, Est Non African American: >89 mL/min (ref >=60)
Glucose, Bld: 124 mg/dL — ABNORMAL HIGH (ref 65–99)
Potassium: 3.6 mmol/L (ref 3.5–5.3)
Sodium: 142 mmol/L (ref 135–146)

## 2016-05-09 LAB — MAGNESIUM: Magnesium: 1.1 mg/dL — ABNORMAL LOW (ref 1.5–2.5)

## 2016-05-09 NOTE — Telephone Encounter (Signed)
Pt. Returned call. Please f/u with pt. °

## 2016-05-09 NOTE — Telephone Encounter (Signed)
Unsuccessful call attempt x 1. Left voicemail advising return call attempted and will try back tomorrow 05/09/16.  Attempted to advise patient per Dr. Adrian Blackwater:

## 2016-05-09 NOTE — Telephone Encounter (Signed)
Attempted to call patient. Left VM requesting a call back.   Elevated Cr and GFR resulted last week was actually a lab error. Labs have been corrected today.   Wanted to apologize to patient for  any worry and for the unnecessary ED visit.  Of note, repeat labs in ED were WNL which is patient's baseline.

## 2016-05-09 NOTE — Telephone Encounter (Signed)
Left voicemail message advising will try back tomorrow 05/10/16.  Attempted to advise patient of message from Dr. Adrian Blackwater on 05/09/2016.

## 2016-05-09 NOTE — Telephone Encounter (Signed)
Phone call received from [place] "Allison Caldwell" states amending Creatine to 0.5 (normal).  Review of chart showed ordering provider Dr Adrian Blackwater has already been notified and addressed. Priscille Heidelberg, RN, BSN

## 2016-05-10 ENCOUNTER — Telehealth (HOSPITAL_COMMUNITY): Payer: Self-pay

## 2016-05-10 ENCOUNTER — Telehealth: Payer: Self-pay

## 2016-05-10 NOTE — Telephone Encounter (Signed)
Telephone call to patient, patient information verified per hipaa guidelines, RN advised pt per Dr. Funches:Elevated Cr and GFR resulted last week was actually a lab error. Labs have been corrected today. Wanted to apologize to patient for  any worry and for the unnecessary ED visit. Of note, repeat labs in ED were WNL which is patient's baseline.   Patient verbalized understanding

## 2016-05-10 NOTE — Telephone Encounter (Signed)
Post ED Visit - Positive Culture Follow-up  Culture report reviewed by antimicrobial stewardship pharmacist:  []  Elenor Quinones, Pharm.D. []  Heide Guile, Pharm.D., BCPS []  Parks Neptune, Pharm.D. [x]  Alycia Rossetti, Pharm.D., BCPS []  Kaycee, Pharm.D., BCPS, AAHIVP []  Legrand Como, Pharm.D., BCPS, AAHIVP []  Cassie Stewart, Pharm.D. []  Rob Evette Doffing, Pharm.D.  Positive urineculture Treated with cephalexin, organism sensitive to the same and no further patient follow-up is required at this time.  Ileene Musa 05/10/2016, 10:35 AM

## 2016-05-23 ENCOUNTER — Other Ambulatory Visit: Payer: Self-pay | Admitting: Family Medicine

## 2016-05-23 DIAGNOSIS — E119 Type 2 diabetes mellitus without complications: Secondary | ICD-10-CM

## 2016-05-23 NOTE — Telephone Encounter (Signed)
Rx Requests 

## 2016-10-10 ENCOUNTER — Encounter: Payer: Self-pay | Admitting: Family Medicine

## 2016-10-10 ENCOUNTER — Ambulatory Visit: Payer: Commercial Managed Care - HMO | Attending: Family Medicine | Admitting: Family Medicine

## 2016-10-10 VITALS — BP 144/85 | HR 83 | Temp 98.0°F | Ht 62.0 in | Wt 270.6 lb

## 2016-10-10 DIAGNOSIS — I10 Essential (primary) hypertension: Secondary | ICD-10-CM | POA: Insufficient documentation

## 2016-10-10 DIAGNOSIS — E669 Obesity, unspecified: Secondary | ICD-10-CM | POA: Insufficient documentation

## 2016-10-10 DIAGNOSIS — R52 Pain, unspecified: Secondary | ICD-10-CM

## 2016-10-10 DIAGNOSIS — E119 Type 2 diabetes mellitus without complications: Secondary | ICD-10-CM

## 2016-10-10 DIAGNOSIS — M109 Gout, unspecified: Secondary | ICD-10-CM | POA: Diagnosis not present

## 2016-10-10 DIAGNOSIS — Z7982 Long term (current) use of aspirin: Secondary | ICD-10-CM | POA: Diagnosis not present

## 2016-10-10 DIAGNOSIS — Z7984 Long term (current) use of oral hypoglycemic drugs: Secondary | ICD-10-CM | POA: Diagnosis not present

## 2016-10-10 DIAGNOSIS — Z87891 Personal history of nicotine dependence: Secondary | ICD-10-CM | POA: Insufficient documentation

## 2016-10-10 LAB — GLUCOSE, POCT (MANUAL RESULT ENTRY): POC Glucose: 125 mg/dl — AB (ref 70–99)

## 2016-10-10 LAB — POCT GLYCOSYLATED HEMOGLOBIN (HGB A1C): Hemoglobin A1C: 7.4

## 2016-10-10 MED ORDER — OLMESARTAN MEDOXOMIL 40 MG PO TABS: 40.0000 mg | ORAL_TABLET | Freq: Every day | ORAL | 2 refills | Status: DC

## 2016-10-10 MED ORDER — GLIPIZIDE 10 MG PO TABS: 10.0000 mg | ORAL_TABLET | Freq: Every day | ORAL | 3 refills | Status: DC

## 2016-10-10 MED ORDER — COLCHICINE 0.6 MG PO TABS: 0.6000 mg | ORAL_TABLET | Freq: Every day | ORAL | 0 refills | Status: DC

## 2016-10-10 NOTE — Addendum Note (Signed)
Addended by: Gomez Cleverly on: 10/10/2016 03:33 PM   Modules accepted: Orders

## 2016-10-10 NOTE — Assessment & Plan Note (Signed)
Elevated above goal Compliant  Increase losartan to 40 mg daily

## 2016-10-10 NOTE — Progress Notes (Signed)
Subjective:  Patient ID: Allison Caldwell, female    DOB: 06/22/1946  Age: 70 y.o. MRN: [id number]  CC: Gout   HPI Allison Caldwell  Has diabetes, HTN, obesity she presents for   1. Joint swelling: started with pain in left first finger  DIP joint, then swelling of R great toe with pain and warmth started 2 months ago. Also having some pain in L toes. She reports that she was drinking beer when the pain started. She is pain free today.  2. HTN: taking diltiazem and losartan 20 mg. No dizziness. No swelling in legs. No CP or SOB.   3. Diabetes: taking metformin and glipizide. Had GI upset in past with 1000 mg metformin. No exercise.    Social History  Substance Use Topics  . Smoking status: Former Smoker    Types: Cigarettes  . Smokeless tobacco: Not on file     Comment: Quit x 2 yrrs.  . Alcohol use No   Outpatient Medications Prior to Visit  Medication Sig Dispense Refill  . acetaminophen (TYLENOL 8 HOUR) 650 MG CR tablet Take 1 tablet (650 mg total) by mouth every 8 (eight) hours as needed for pain.    Marland Kitchen acetaminophen (TYLENOL) 500 MG tablet Take 1,000 mg by mouth daily as needed for mild pain.    Marland Kitchen aspirin EC 81 MG tablet Take 1 tablet (81 mg total) by mouth every morning. 90 tablet 3  . diltiazem (TIAZAC) 420 MG 24 hr capsule Take 1 capsule (420 mg total) by mouth daily. 90 capsule 3  . glipiZIDE (GLUCOTROL) 5 MG tablet Take 1 tablet (5 mg total) by mouth daily before breakfast. 90 tablet 3  . metFORMIN (GLUCOPHAGE) 500 MG tablet TAKE 1 TABLET TWICE DAILY WITH A MEAL 180 tablet 3  . Multiple Vitamin (MULTIVITAMIN WITH MINERALS) TABS tablet Take 1 tablet by mouth daily. 90 tablet 3  . naproxen sodium (ANAPROX) 220 MG tablet Take 220 mg by mouth 2 (two) times daily with a meal.    . olmesartan (BENICAR) 20 MG tablet Take 1 tablet (20 mg total) by mouth daily. 90 tablet 3  . pravastatin (PRAVACHOL) 20 MG tablet Take 1 tablet (20 mg total) by mouth at bedtime. 90 tablet  3  . sertraline (ZOLOFT) 100 MG tablet Take 1 tablet (100 mg total) by mouth every morning. 90 tablet 1  . cephALEXin (KEFLEX) 500 MG capsule Take 1 capsule (500 mg total) by mouth 3 (three) times daily. (Patient not taking: Reported on 10/10/2016) 15 capsule 0  . triamcinolone cream (KENALOG) 0.1 % Apply 1 application topically 2 (two) times daily. Apply to light areas on face (Patient not taking: Reported on 10/10/2016) 60 g 0   No facility-administered medications prior to visit.     ROS Review of Systems  Constitutional: Negative for chills and fever.  Eyes: Negative for visual disturbance.  Respiratory: Negative for shortness of breath.   Cardiovascular: Negative for chest pain.  Gastrointestinal: Negative for abdominal pain and blood in stool.  Musculoskeletal: Positive for arthralgias and joint swelling. Negative for back pain.  Skin: Negative for rash.  Allergic/Immunologic: Negative for immunocompromised state.  Hematological: Negative for adenopathy. Does not bruise/bleed easily.  Psychiatric/Behavioral: Negative for dysphoric mood and suicidal ideas.    Objective:  BP (!) 144/85 (BP Location: Left Arm, Patient Position: Sitting, Cuff Size: Large)   Pulse 83   Temp 98 F (36.7 C) (Oral)   Ht 5\' 2"  (1.575 m)   Wt  270 lb 9.6 oz (122.7 kg)   SpO2 97%   BMI 49.49 kg/m   BP/Weight 10/10/2016 0000000 0000000  Systolic BP 123456 0000000 [id number]  Diastolic BP 85 82 97  Wt. (Lbs) 270.6 - 276.6  BMI 49.49 - 50.58   Physical Exam  Constitutional: She is oriented to person, place, and time. She appears well-developed and well-nourished. No distress.  HENT:  Head: Normocephalic and atraumatic.  Cardiovascular: Normal rate, regular rhythm, normal heart sounds and intact distal pulses.   Pulmonary/Chest: Effort normal and breath sounds normal.  Musculoskeletal: She exhibits no edema.       Hands:      Feet:  Neurological: She is alert and oriented to person, place, and time.    Skin: Skin is warm and dry. No rash noted.  Psychiatric: She has a normal mood and affect.   Lab Results  Component Value Date   HGBA1C 7.4 10/10/2016   CBG 125  Assessment & Plan:   Allison Caldwell was seen today for gout.  Diagnoses and all orders for this visit:  Controlled type 2 diabetes mellitus without complication, unspecified long term insulin use status (HCC) -     POCT glucose (manual entry) -     POCT glycosylated hemoglobin (Hb A1C)  Migratory pain -     Uric Acid; Future -     colchicine 0.6 MG tablet; Take 1 tablet (0.6 mg total) by mouth daily. Take 1.2 mg then 0.6 mg 1 hr later, then 0.6 mg twice daily  HYPERTENSION, BENIGN -     olmesartan (BENICAR) 40 MG tablet; Take 1 tablet (40 mg total) by mouth daily.    No orders of the defined types were placed in this encounter.   Follow-up: No Follow-up on file.   Boykin Nearing MD

## 2016-10-10 NOTE — Progress Notes (Signed)
Pt is here today for swelling in finger and big toe on right foot.  Pt declined flu shot.

## 2016-10-10 NOTE — Patient Instructions (Addendum)
Allison Caldwell was seen today for gout.  Diagnoses and all orders for this visit:  Controlled type 2 diabetes mellitus without complication, unspecified long term insulin use status (HCC) -     POCT glucose (manual entry) -     POCT glycosylated hemoglobin (Hb A1C) -     glipiZIDE (GLUCOTROL) 10 MG tablet; Take 1 tablet (10 mg total) by mouth daily before breakfast.  Migratory pain -     Uric Acid; Future -     colchicine 0.6 MG tablet; Take 1 tablet (0.6 mg total) by mouth daily. Take 1.2 mg then 0.6 mg 1 hr later, then 0.6 mg twice daily -     ANA,IFA RA Diag Pnl w/rflx Tit/Patn; Future  HYPERTENSION, BENIGN -     olmesartan (BENICAR) 40 MG tablet; Take 1 tablet (40 mg total) by mouth daily.   F/u in  6 weeks for suspected gout   Dr. Adrian Blackwater  Low-Purine Diet Purines are compounds that affect the level of uric acid in your body. A low-purine diet is a diet that is low in purines. Eating a low-purine diet can prevent the level of uric acid in your body from getting too high and causing gout or kidney stones or both. What do I need to know about this diet?  Choose low-purine foods. Examples of low-purine foods are listed in the next section.  Drink plenty of fluids, especially water. Fluids can help remove uric acid from your body. Try to drink 8-16 cups (1.9-3.8 L) a day.  Limit foods high in fat, especially saturated fat, as fat makes it harder for the body to get rid of uric acid. Foods high in saturated fat include pizza, cheese, ice cream, whole milk, fried foods, and gravies. Choose foods that are lower in fat and lean sources of protein. Use olive oil when cooking as it contains healthy fats that are not high in saturated fat.  Limit alcohol. Alcohol interferes with the elimination of uric acid from your body. If you are having a gout attack, avoid all alcohol.  Keep in mind that different people's bodies react differently to different foods. You will probably learn over time which foods  do or do not affect you. If you discover that a food tends to cause your gout to flare up, avoid eating that food. You can more freely enjoy foods that do not cause problems. If you have any questions about a food item, talk to your dietitian or health care provider. Which foods are low, moderate, and high in purines? The following is a list of foods that are low, moderate, and high in purines. You can eat any amount of the foods that are low in purines. You may be able to have small amounts of foods that are moderate in purines. Ask your health care provider how much of a food moderate in purines you can have. Avoid foods high in purines. Grains  Foods low in purines: Enriched white bread, pasta, rice, cake, cornbread, popcorn.  Foods moderate in purines: Whole-grain breads and cereals, wheat germ, bran, oatmeal. Uncooked oatmeal. Dry wheat bran or wheat germ.  Foods high in purines: Pancakes, [place] toast, biscuits, muffins. Vegetables  Foods low in purines: All vegetables, except those that are moderate in purines.  Foods moderate in purines: Asparagus, cauliflower, spinach, mushrooms, green peas. Fruits  All fruits are low in purines. Meats and other Protein Foods  Foods low in purines: Eggs, nuts, peanut butter.  Foods moderate in purines: 80-90% lean  beef, lamb, veal, pork, poultry, fish, eggs, peanut butter, nuts. Crab, lobster, oysters, and shrimp. Cooked dried beans, peas, and lentils.  Foods high in purines: Anchovies, sardines, herring, mussels, tuna, codfish, scallops, trout, and haddock. Berniece Salines. Organ meats (such as liver or kidney). Tripe. Game meat. Goose. Sweetbreads. Dairy  All dairy foods are low in purines. Low-fat and fat-free dairy products are best because they are low in saturated fat. Beverages  Drinks low in purines: Water, carbonated beverages, tea, coffee, cocoa.  Drinks moderate in purines: Soft drinks and other drinks sweetened with high-fructose corn  syrup. Juices. To find whether a food or drink is sweetened with high-fructose corn syrup, look at the ingredients list.  Drinks high in purines: Alcoholic beverages (such as beer). Condiments  Foods low in purines: Salt, herbs, olives, pickles, relishes, vinegar.  Foods moderate in purines: Butter, margarine, oils, mayonnaise. Fats and Oils  Foods low in purines: All types, except gravies and sauces made with meat.  Foods high in purines: Gravies and sauces made with meat. Other Foods  Foods low in purines: Sugars, sweets, gelatin. Cake. Soups made without meat.  Foods moderate in purines: Meat-based or fish-based soups, broths, or bouillons. Foods and drinks sweetened with high-fructose corn syrup.  Foods high in purines: High-fat desserts (such as ice cream, cookies, cakes, pies, doughnuts, and chocolate). Contact your dietitian for more information on foods that are not listed here.  This information is not intended to replace advice given to you by your health care provider. Make sure you discuss any questions you have with your health care provider. Document Released: 02/11/2011 Document Revised: 03/24/2016 Document Reviewed: 09/23/2013 Elsevier Interactive Patient Education  2017 [employer].

## 2016-10-10 NOTE — Assessment & Plan Note (Signed)
Controlled A1c slightly above goal Compliant with meds Non compliant with exercise  Increase glipizide to 10 mg daily

## 2016-10-10 NOTE — Assessment & Plan Note (Signed)
Suspect gout Plan:  Uric acid check Rheumatoid factor check colchicine Low purine diet

## 2016-10-13 ENCOUNTER — Other Ambulatory Visit: Payer: Self-pay | Admitting: Family Medicine

## 2016-10-13 ENCOUNTER — Ambulatory Visit: Payer: Commercial Managed Care - HMO | Attending: Family Medicine

## 2016-10-13 DIAGNOSIS — R52 Pain, unspecified: Secondary | ICD-10-CM

## 2016-10-13 DIAGNOSIS — R768 Other specified abnormal immunological findings in serum: Secondary | ICD-10-CM | POA: Insufficient documentation

## 2016-10-13 DIAGNOSIS — M25562 Pain in left knee: Secondary | ICD-10-CM | POA: Insufficient documentation

## 2016-10-13 DIAGNOSIS — I10 Essential (primary) hypertension: Secondary | ICD-10-CM

## 2016-10-13 DIAGNOSIS — R7689 Other specified abnormal immunological findings in serum: Secondary | ICD-10-CM

## 2016-10-13 DIAGNOSIS — M25561 Pain in right knee: Secondary | ICD-10-CM | POA: Insufficient documentation

## 2016-10-13 DIAGNOSIS — M549 Dorsalgia, unspecified: Secondary | ICD-10-CM | POA: Insufficient documentation

## 2016-10-13 MED ORDER — COLCHICINE 0.6 MG PO TABS: ORAL_TABLET | ORAL | 0 refills | Status: DC

## 2016-10-13 NOTE — Progress Notes (Signed)
Patient here for lab visit only 

## 2016-10-14 LAB — BASIC METABOLIC PANEL WITH GFR
BUN: 10 mg/dL (ref 7–25)
CO2: 26 mmol/L (ref 20–31)
Calcium: 9 mg/dL (ref 8.6–10.4)
Chloride: 103 mmol/L (ref 98–110)
Creat: 0.61 mg/dL (ref 0.60–0.93)
GFR, Est African American: >89 mL/min (ref >=60)
GFR, Est Non African American: >89 mL/min (ref >=60)
Glucose, Bld: 239 mg/dL — ABNORMAL HIGH (ref 65–99)
Potassium: 3.5 mmol/L (ref 3.5–5.3)
Sodium: 144 mmol/L (ref 135–146)

## 2016-10-14 LAB — ANA,IFA RA DIAG PNL W/RFLX TIT/PATN
Anti Nuclear Antibody(ANA): NEGATIVE
Cyclic Citrullin Peptide Ab: <16 Units
Rhuematoid fact SerPl-aCnc: 17 IU/mL — ABNORMAL HIGH (ref <14)

## 2016-10-14 LAB — URIC ACID: Uric Acid, Serum: 5.4 mg/dL (ref 2.5–7.0)

## 2016-10-16 DIAGNOSIS — R768 Other specified abnormal immunological findings in serum: Secondary | ICD-10-CM | POA: Insufficient documentation

## 2016-10-16 MED ORDER — NAPROXEN 500 MG PO TABS: 500.0000 mg | ORAL_TABLET | Freq: Two times a day (BID) | ORAL | 0 refills | Status: DC

## 2016-10-16 NOTE — Assessment & Plan Note (Signed)
Uric acid normal Rheumatoid factor elevated CRP and sed rate ordered Naproxen Rheumatology referral

## 2016-10-16 NOTE — Assessment & Plan Note (Signed)
Elevated rheumatoid factor with joint pain (knees, back) for past 2 years Normal ANA and CCP Sed rate an CRP ordered

## 2016-10-16 NOTE — Addendum Note (Signed)
Addended by: Boykin Nearing on: 10/16/2016 12:39 PM   Modules accepted: Orders

## 2016-10-17 ENCOUNTER — Telehealth: Payer: Self-pay | Admitting: Family Medicine

## 2016-10-17 NOTE — Telephone Encounter (Signed)
Called patient's Allison Caldwell discontinued colchicine. Call completed  [name] discontinued colchicine   Called patient Verified name and DOB Gave lab results   Normal uric acid, no need to take colchicine  Rheumatoid factor elevated concerning for possible rheumatoid arthritis, patient asked to return for sed rate and CRP  Naproxen ordered, take with food. This is for pain and inflammation Rheumatology referral placed  Normal CMP except high sugar

## 2016-11-11 ENCOUNTER — Telehealth: Payer: Self-pay | Admitting: Family Medicine

## 2016-11-11 DIAGNOSIS — E119 Type 2 diabetes mellitus without complications: Secondary | ICD-10-CM

## 2016-11-11 DIAGNOSIS — I10 Essential (primary) hypertension: Secondary | ICD-10-CM

## 2016-11-11 NOTE — Telephone Encounter (Signed)
Patient states that her new prescription orders have started to make her feel sick and sleep all day. Would like someone to please call and speak with her to change her dosage. olmesartan (BENICAR) 40 MG tablet and glipiZIDE (GLUCOTROL) 10 MG tablet

## 2016-11-14 NOTE — Telephone Encounter (Signed)
Will route to PCP 

## 2016-11-14 NOTE — Telephone Encounter (Signed)
  Called patient Left VM Will try back tomorrow In meantime take 1/2 tab of glipizide, so reduce to 5 mg

## 2016-11-15 MED ORDER — OLMESARTAN MEDOXOMIL 40 MG PO TABS: 40.0000 mg | ORAL_TABLET | Freq: Every day | ORAL | 2 refills | Status: DC

## 2016-11-15 MED ORDER — GLIPIZIDE 10 MG PO TABS: 5.0000 mg | ORAL_TABLET | Freq: Every day | ORAL | 11 refills | Status: DC

## 2016-11-15 MED ORDER — GLIPIZIDE 5 MG PO TABS: 5.0000 mg | ORAL_TABLET | Freq: Every day | ORAL | 3 refills | Status: DC

## 2016-11-15 NOTE — Addendum Note (Signed)
Addended by: Boykin Nearing on: 11/15/2016 05:07 PM   Modules accepted: Orders

## 2016-11-15 NOTE — Telephone Encounter (Signed)
Called patient Verified name and DOB She feels tired and dizzy after taking glipizide 10 mg and benicar 40 mg in AM.  Lab Results  Component Value Date   HGBA1C 7.4 10/10/2016   BP Readings from Last 3 Encounters:  10/10/16 (!) 144/85  05/06/16 158/82  05/05/16 (!) 147/97    Plan: Decrease glipizide to 5 mg in AM Change benicar to 40 mg at bedtime   She also reports hoarseness, Wonders if the medication  Advised gargling with Listerine or warm salt water Lozenges  She can also take Corcidin HBP

## 2016-12-20 ENCOUNTER — Other Ambulatory Visit: Payer: Self-pay | Admitting: Family Medicine

## 2016-12-20 DIAGNOSIS — F341 Dysthymic disorder: Secondary | ICD-10-CM

## 2016-12-20 DIAGNOSIS — E785 Hyperlipidemia, unspecified: Secondary | ICD-10-CM

## 2017-03-15 ENCOUNTER — Encounter: Payer: Self-pay | Admitting: Family Medicine

## 2017-08-16 ENCOUNTER — Telehealth: Payer: Self-pay

## 2017-08-16 DIAGNOSIS — E785 Hyperlipidemia, unspecified: Secondary | ICD-10-CM

## 2017-08-16 DIAGNOSIS — F341 Dysthymic disorder: Secondary | ICD-10-CM

## 2017-08-16 DIAGNOSIS — I10 Essential (primary) hypertension: Secondary | ICD-10-CM

## 2017-08-16 MED ORDER — METFORMIN HCL 500 MG PO TABS: ORAL_TABLET | ORAL | 0 refills | Status: DC

## 2017-08-16 MED ORDER — OLMESARTAN MEDOXOMIL 40 MG PO TABS: 40.0000 mg | ORAL_TABLET | Freq: Every day | ORAL | 0 refills | Status: DC

## 2017-08-16 MED ORDER — PRAVASTATIN SODIUM 20 MG PO TABS: 20.0000 mg | ORAL_TABLET | Freq: Every day | ORAL | 0 refills | Status: DC

## 2017-08-16 MED ORDER — DILTIAZEM HCL ER BEADS 420 MG PO CP24: 420.0000 mg | ORAL_CAPSULE | Freq: Every day | ORAL | 0 refills | Status: DC

## 2017-08-16 MED ORDER — SERTRALINE HCL 100 MG PO TABS: 100.0000 mg | ORAL_TABLET | Freq: Every morning | ORAL | 0 refills | Status: DC

## 2017-08-16 MED ORDER — GLIPIZIDE 5 MG PO TABS: 5.0000 mg | ORAL_TABLET | Freq: Every day | ORAL | 0 refills | Status: DC

## 2017-08-16 NOTE — Telephone Encounter (Signed)
Pt contacted the office making an appointment with Dr. Wynetta Emery appointment is schedule for 10-03-17  and is requesting medication refills and would like them sent to [facility]. Please f/u

## 2017-08-16 NOTE — Telephone Encounter (Signed)
Will refill to last until appt. Patient should have had enough based on the scripts Dr. Adrian Blackwater left so will refill to get her enough to last her the amount of time Dr. Adrian Blackwater intended it to last. Must keep appt for further refills.

## 2017-08-17 DIAGNOSIS — E119 Type 2 diabetes mellitus without complications: Secondary | ICD-10-CM | POA: Diagnosis not present

## 2017-08-17 DIAGNOSIS — R6889 Other general symptoms and signs: Secondary | ICD-10-CM | POA: Diagnosis not present

## 2017-08-17 DIAGNOSIS — H2512 Age-related nuclear cataract, left eye: Secondary | ICD-10-CM | POA: Diagnosis not present

## 2017-08-17 DIAGNOSIS — H26491 Other secondary cataract, right eye: Secondary | ICD-10-CM | POA: Diagnosis not present

## 2017-08-17 DIAGNOSIS — H04123 Dry eye syndrome of bilateral lacrimal glands: Secondary | ICD-10-CM | POA: Diagnosis not present

## 2017-08-17 DIAGNOSIS — H40012 Open angle with borderline findings, low risk, left eye: Secondary | ICD-10-CM | POA: Diagnosis not present

## 2017-08-17 DIAGNOSIS — Z961 Presence of intraocular lens: Secondary | ICD-10-CM | POA: Diagnosis not present

## 2017-08-17 DIAGNOSIS — H401113 Primary open-angle glaucoma, right eye, severe stage: Secondary | ICD-10-CM | POA: Diagnosis not present

## 2017-08-28 ENCOUNTER — Emergency Department (HOSPITAL_COMMUNITY): Payer: Commercial Managed Care - HMO

## 2017-08-28 ENCOUNTER — Emergency Department (HOSPITAL_COMMUNITY)
Admission: EM | Admit: 2017-08-28 | Discharge: 2017-08-28 | Disposition: A | Discharge status: Home / Self Care | Payer: Commercial Managed Care - HMO | Attending: Emergency Medicine | Admitting: Emergency Medicine

## 2017-08-28 ENCOUNTER — Encounter (HOSPITAL_COMMUNITY): Payer: Self-pay

## 2017-08-28 DIAGNOSIS — Z87891 Personal history of nicotine dependence: Secondary | ICD-10-CM | POA: Diagnosis not present

## 2017-08-28 DIAGNOSIS — Z79899 Other long term (current) drug therapy: Secondary | ICD-10-CM | POA: Insufficient documentation

## 2017-08-28 DIAGNOSIS — Z7982 Long term (current) use of aspirin: Secondary | ICD-10-CM | POA: Diagnosis not present

## 2017-08-28 DIAGNOSIS — Z7984 Long term (current) use of oral hypoglycemic drugs: Secondary | ICD-10-CM | POA: Diagnosis not present

## 2017-08-28 DIAGNOSIS — M5442 Lumbago with sciatica, left side: Secondary | ICD-10-CM | POA: Diagnosis not present

## 2017-08-28 DIAGNOSIS — I10 Essential (primary) hypertension: Secondary | ICD-10-CM | POA: Insufficient documentation

## 2017-08-28 DIAGNOSIS — Z8673 Personal history of transient ischemic attack (TIA), and cerebral infarction without residual deficits: Secondary | ICD-10-CM | POA: Diagnosis not present

## 2017-08-28 DIAGNOSIS — M25551 Pain in right hip: Secondary | ICD-10-CM | POA: Diagnosis not present

## 2017-08-28 DIAGNOSIS — E119 Type 2 diabetes mellitus without complications: Secondary | ICD-10-CM | POA: Insufficient documentation

## 2017-08-28 MED ORDER — OXYCODONE-ACETAMINOPHEN 5-325 MG PO TABS
ORAL_TABLET | ORAL | Status: AC
  Filled 2017-08-28: qty 1

## 2017-08-28 MED ORDER — OXYCODONE-ACETAMINOPHEN 5-325 MG PO TABS
1.0000 | ORAL_TABLET | ORAL | Status: DC | PRN
  Administered 2017-08-28: 1 via ORAL

## 2017-08-28 MED ORDER — PREDNISONE 20 MG PO TABS: 40.0000 mg | ORAL_TABLET | Freq: Every day | ORAL | 0 refills | Status: AC

## 2017-08-28 NOTE — ED Triage Notes (Signed)
Per Pt, Pt is coming from home with complaints of left hip pain that is radiating down her left leg. Started seven days ago and has become worse. Hx of HTN without taking medication.

## 2017-08-28 NOTE — ED Provider Notes (Signed)
[facility]PART[facility] Provider Note   CSN: [id number] Arrival date & time: 08/28/17  5093     History   Chief Complaint Chief Complaint  Patient presents with  . Hip Pain    HPI Allison Caldwell is a 71 y.o. female.  HPI Patient presents emergency department with approximately 2 weeks of radiating left low back pain radiating down her left hip and down into her left lower leg.  She states her pain is been worsening over the past 7 days.  Denies abdominal pain.  No fevers or chills.  No weakness of her left lower extremity.  No recent injury or trauma.  Symptoms are moderate in severity.  Worse with movement.   Past Medical History:  Diagnosis Date  . Anxiety   . Cardiomyopathy   . Degenerative joint disease   . Depression   . Diabetes mellitus    NIDDM  . ETOH abuse   . Hypertension   . Obesities, morbid ([place])   . TIA (transient ischemic attack)     Patient Active Problem List   Diagnosis Date Noted  . Elevated rheumatoid factor 10/16/2016  . Migratory pain 10/10/2016  . Kidney disease 05/06/2016  . HLD (hyperlipidemia) 05/06/2016  . Vitiligo 05/05/2016  . Right knee pain 09/28/2015  . Low back pain 09/28/2015  . Onychomycosis of toenail 09/28/2015  . Hypomagnesemia 04/29/2014  . HYPERTENSION, BENIGN 05/12/2010  . ANXIETY DEPRESSION 04/06/2010  . GLAUCOMA 01/20/2009  . Hyperlipemia 11/20/2008  . Diabetes type 2, controlled ([place]) 12/27/2006  . Body mass index (BMI) of 45.0-49.9 in adult [facility]) 12/27/2006    Past Surgical History:  Procedure Laterality Date  . CESAREAN SECTION     times 2  . EYE SURGERY     laser - right eye 2010  . left wrist     surgical repair  . ORIF ANKLE FRACTURE     right    OB History    No data available       Home Medications    Prior to Admission medications   Medication Sig Start Date End Date Taking? Authorizing Provider  acetaminophen (TYLENOL 8 HOUR) 650 MG CR tablet Take 1  tablet (650 mg total) by mouth every 8 (eight) hours as needed for pain. 05/05/16   Funches, Adriana Mccallum, MD  acetaminophen (TYLENOL) 500 MG tablet Take 1,000 mg by mouth daily as needed for mild pain.    [provider]  aspirin EC 81 MG tablet Take 1 tablet (81 mg total) by mouth every morning. 05/05/16   Funches, Adriana Mccallum, MD  diltiazem (TIAZAC) 420 MG 24 hr capsule Take 1 capsule (420 mg total) by mouth daily. 08/16/17   Arnoldo Morale, MD  glipiZIDE (GLUCOTROL) 5 MG tablet Take 1 tablet (5 mg total) by mouth daily before breakfast. 08/16/17   Arnoldo Morale, MD  metFORMIN (GLUCOPHAGE) 500 MG tablet TAKE 1 TABLET TWICE DAILY WITH A MEAL 08/16/17   Arnoldo Morale, MD  Multiple Vitamin (MULTIVITAMIN WITH MINERALS) TABS tablet Take 1 tablet by mouth daily. 05/05/16   Funches, Adriana Mccallum, MD  naproxen (NAPROSYN) 500 MG tablet Take 1 tablet (500 mg total) by mouth 2 (two) times daily with a meal. 10/16/16   Funches, Josalyn, MD  olmesartan (BENICAR) 40 MG tablet Take 1 tablet (40 mg total) by mouth at bedtime. 08/16/17   Arnoldo Morale, MD  pravastatin (PRAVACHOL) 20 MG tablet Take 1 tablet (20 mg total) by mouth at bedtime. 08/16/17   Arnoldo Morale,  MD  predniSONE (DELTASONE) 20 MG tablet Take 2 tablets (40 mg total) by mouth daily. 08/28/17 09/02/17  Jola Schmidt, MD  sertraline (ZOLOFT) 100 MG tablet Take 1 tablet (100 mg total) by mouth every morning. 08/16/17   Arnoldo Morale, MD    Family History Family History  Problem Relation Age of Onset  . Heart disease Mother   . Cancer Father   . Diabetes Sister   . Kidney disease Brother   . Stroke Brother     Social History Social History  Substance Use Topics  . Smoking status: Former Smoker    Types: Cigarettes  . Smokeless tobacco: Never Used     Comment: Quit x 2 yrrs.  . Alcohol use No     Allergies   Ace inhibitors and Penicillins   Review of Systems Review of Systems  All other systems reviewed and are negative.    Physical  Exam Updated Vital Signs BP (!) 208/113 (BP Location: Left Arm)   Pulse 94   Temp 97.7 F (36.5 C) (Oral)   Resp 16   Ht 5\' 2"  (1.575 m)   Wt 122.5 kg (270 lb)   SpO2 100%   BMI 49.38 kg/m   Physical Exam  Constitutional: She is oriented to person, place, and time. She appears well-developed and well-nourished.  HENT:  Head: Normocephalic.  Eyes: EOM are normal.  Neck: Normal range of motion.  Pulmonary/Chest: Effort normal.  Abdominal: Soft. She exhibits no distension.  Musculoskeletal: Normal range of motion.  Full range of motion of the left hip, left knee, left ankle.  Normal PT and DP pulse in the left foot.  No swelling of the left lower extremity as compared to the right.  Normal strength in major muscle groups of the left lower extremity.  Neurological: She is alert and oriented to person, place, and time.  Psychiatric: She has a normal mood and affect.  Nursing note and vitals reviewed.    ED Treatments / Results  Labs (all labs ordered are listed, but only abnormal results are displayed) Labs Reviewed - No data to display  EKG  EKG Interpretation None       Radiology Dg Hip Unilat With Pelvis 2-3 Views Left  Result Date: 08/28/2017 CLINICAL DATA:  Radiating right hip pain EXAM: DG HIP (WITH OR WITHOUT PELVIS) 2-3V LEFT COMPARISON:  None available FINDINGS: Osteopenia and degenerative changes noted of the lumbosacral spine, pelvis and both hips. No malalignment or acute fracture. Bony pelvis and hips appear symmetric. No displaced hip fracture. IMPRESSION: Osteopenia and degenerative changes. No acute finding by plain radiography. Electronically Signed   By: Jerilynn Mages.  Shick M.D.   On: 08/28/2017 10:12    Procedures Procedures (including critical care time)  Medications Ordered in ED Medications  oxyCODONE-acetaminophen (PERCOCET/ROXICET) 5-325 MG per tablet 1 tablet (1 tablet Oral Given 08/28/17 0944)  oxyCODONE-acetaminophen (PERCOCET/ROXICET) 5-325 MG per  tablet (not administered)     Initial Impression / Assessment and Plan / ED Course  I have reviewed the triage vital signs and the nursing notes.  Pertinent labs & imaging results that were available during my care of the patient were reviewed by me and considered in my medical decision making (see chart for details).     Left sided sciatica.  Recommended anti-inflammatories.  Short course of prednisone.  Primary care follow-up.  Patient understands return to the ER for new or worsening symptoms.  Doubt DVT.  Normal arterial pulses.  No signs of infection.  Final Clinical Impressions(s) / ED Diagnoses   Final diagnoses:  Acute left-sided low back pain with left-sided sciatica    New Prescriptions New Prescriptions   PREDNISONE (DELTASONE) 20 MG TABLET    Take 2 tablets (40 mg total) by mouth daily.     Jola Schmidt, MD 08/28/17 [phone]

## 2017-08-28 NOTE — ED Notes (Signed)
Pt wheeled to hallway bed and was able to stand and pivot herself independently from chair to bed.

## 2017-10-03 ENCOUNTER — Ambulatory Visit: Payer: Commercial Managed Care - HMO | Admitting: Internal Medicine

## 2017-10-18 ENCOUNTER — Other Ambulatory Visit: Payer: Self-pay | Admitting: Family Medicine

## 2017-10-18 DIAGNOSIS — I10 Essential (primary) hypertension: Secondary | ICD-10-CM

## 2017-10-18 DIAGNOSIS — F341 Dysthymic disorder: Secondary | ICD-10-CM

## 2017-11-14 ENCOUNTER — Other Ambulatory Visit: Payer: Self-pay | Admitting: Family Medicine

## 2017-12-06 ENCOUNTER — Ambulatory Visit (INDEPENDENT_AMBULATORY_CARE_PROVIDER_SITE_OTHER): Payer: Medicare HMO | Admitting: Internal Medicine

## 2017-12-06 ENCOUNTER — Other Ambulatory Visit: Payer: Self-pay

## 2017-12-06 ENCOUNTER — Encounter: Payer: Self-pay | Admitting: Internal Medicine

## 2017-12-06 VITALS — BP 130/84 | HR 90 | Temp 98.8°F | Ht 62.0 in | Wt 261.7 lb

## 2017-12-06 DIAGNOSIS — F339 Major depressive disorder, recurrent, unspecified: Secondary | ICD-10-CM

## 2017-12-06 DIAGNOSIS — F341 Dysthymic disorder: Secondary | ICD-10-CM

## 2017-12-06 DIAGNOSIS — E1169 Type 2 diabetes mellitus with other specified complication: Secondary | ICD-10-CM

## 2017-12-06 DIAGNOSIS — G8911 Acute pain due to trauma: Secondary | ICD-10-CM | POA: Diagnosis not present

## 2017-12-06 DIAGNOSIS — E785 Hyperlipidemia, unspecified: Secondary | ICD-10-CM | POA: Diagnosis not present

## 2017-12-06 DIAGNOSIS — R6889 Other general symptoms and signs: Secondary | ICD-10-CM | POA: Diagnosis not present

## 2017-12-06 DIAGNOSIS — H409 Unspecified glaucoma: Secondary | ICD-10-CM

## 2017-12-06 DIAGNOSIS — I10 Essential (primary) hypertension: Secondary | ICD-10-CM

## 2017-12-06 DIAGNOSIS — Z1231 Encounter for screening mammogram for malignant neoplasm of breast: Secondary | ICD-10-CM

## 2017-12-06 DIAGNOSIS — Z7984 Long term (current) use of oral hypoglycemic drugs: Secondary | ICD-10-CM | POA: Diagnosis not present

## 2017-12-06 DIAGNOSIS — Z87891 Personal history of nicotine dependence: Secondary | ICD-10-CM | POA: Diagnosis not present

## 2017-12-06 DIAGNOSIS — H42 Glaucoma in diseases classified elsewhere: Secondary | ICD-10-CM | POA: Diagnosis not present

## 2017-12-06 DIAGNOSIS — I152 Hypertension secondary to endocrine disorders: Secondary | ICD-10-CM

## 2017-12-06 DIAGNOSIS — E119 Type 2 diabetes mellitus without complications: Secondary | ICD-10-CM | POA: Diagnosis not present

## 2017-12-06 DIAGNOSIS — Z6841 Body Mass Index (BMI) 40.0 and over, adult: Secondary | ICD-10-CM | POA: Diagnosis not present

## 2017-12-06 DIAGNOSIS — E1139 Type 2 diabetes mellitus with other diabetic ophthalmic complication: Secondary | ICD-10-CM | POA: Diagnosis not present

## 2017-12-06 DIAGNOSIS — E1159 Type 2 diabetes mellitus with other circulatory complications: Secondary | ICD-10-CM

## 2017-12-06 DIAGNOSIS — M79605 Pain in left leg: Secondary | ICD-10-CM | POA: Insufficient documentation

## 2017-12-06 DIAGNOSIS — M79662 Pain in left lower leg: Secondary | ICD-10-CM

## 2017-12-06 DIAGNOSIS — E2839 Other primary ovarian failure: Secondary | ICD-10-CM

## 2017-12-06 DIAGNOSIS — Z79899 Other long term (current) drug therapy: Secondary | ICD-10-CM

## 2017-12-06 LAB — GLUCOSE, CAPILLARY: Glucose-Capillary: 108 mg/dL — ABNORMAL HIGH (ref 65–99)

## 2017-12-06 LAB — POCT GLYCOSYLATED HEMOGLOBIN (HGB A1C): Hemoglobin A1C: 8

## 2017-12-06 MED ORDER — ACETAMINOPHEN 500 MG PO TABS: 1000.0000 mg | ORAL_TABLET | Freq: Four times a day (QID) | ORAL | 0 refills | Status: DC | PRN

## 2017-12-06 MED ORDER — OLMESARTAN MEDOXOMIL 40 MG PO TABS: 40.0000 mg | ORAL_TABLET | Freq: Every day | ORAL | 0 refills | Status: DC

## 2017-12-06 MED ORDER — PRAVASTATIN SODIUM 20 MG PO TABS: 20.0000 mg | ORAL_TABLET | Freq: Every day | ORAL | 0 refills | Status: DC

## 2017-12-06 MED ORDER — DILTIAZEM HCL ER BEADS 420 MG PO CP24: 420.0000 mg | ORAL_CAPSULE | Freq: Every day | ORAL | 0 refills | Status: DC

## 2017-12-06 MED ORDER — METFORMIN HCL 500 MG PO TABS: ORAL_TABLET | ORAL | 0 refills | Status: DC

## 2017-12-06 MED ORDER — SERTRALINE HCL 100 MG PO TABS: 100.0000 mg | ORAL_TABLET | Freq: Every morning | ORAL | 0 refills | Status: DC

## 2017-12-06 NOTE — Patient Instructions (Addendum)
It was a pleasure to meet you today Ms. Gillum   I will send in refills for all of your medications   For your diabetes, because you are having some low blood sugars lets try stopping the glipizide today. If your A1c ( the check of your blood glucose over the last 3 months) comes back elevated I will call you with a different medication plan.   Your blood pressure is not quite at goal but you are close, please try to eat a low salt diet and stay active, please continue to take your diltiazem and olmesartan every day    For the pain in your leg please take Tylenol 1000 mg every 6 hours for the next week, we are also ordering an xray, please reach out to the clinic if you have not heard from someone about scheduling the xray within the next two weeks   please reach out to the clinic if you have not heard from someone about scheduling the bone density and mammogram within the next two weeks   FOLLOW-UP INSTRUCTIONS When: one - two months, with your new primary care doctor  For: to meet your doctor and get a check up  What to bring: all of your medication bottles

## 2017-12-06 NOTE — Assessment & Plan Note (Signed)
In October she began to have an aching pain on the lateral side of her left leg. This began after she had to ascend from a lifted Lucianne Lei, she did not fall she thinks the pain is related to this trauma.  Pain becomes worse with walking, not relieved by topical analgesics or Tylenol 500 mg.  She does have lateral hip pain but denies symptoms of radiculopathy. No tenderness over the area on exam. No knee tenderness but exam is consistent with osteomyelitis and she describes sounds of snapping and popping of her knees without locking. The leg pain is in for injury related to some impact trauma sustained in October. -trial of Tylenol 1000 mg every 6 hours -Order placed for x-ray of the tibia and fibula

## 2017-12-06 NOTE — Assessment & Plan Note (Signed)
PHQ- 2 is zero today, she does not report difficulties with mood.  - refilled sertraline 100 mg qd

## 2017-12-06 NOTE — Assessment & Plan Note (Signed)
BMI is 47 with multiple comorbidities including type 2 diabetes, hypertension, and osteoarthritis.  Today we had a discussion about how loosing weight through healthy eating and light exercise could help to improve lower body joint pains. - Will need ongoing encouragement

## 2017-12-06 NOTE — Progress Notes (Signed)
CC: Establishing care in the internal medicine clinic, also having left lower leg pain   HPI:  Allison Caldwell is a 72 y.o. with PMH Type 2 diabetes mellitus, Hypertension,  depression, glaucoma (s/p laser surgery in 2010), former smoker as listed below who presents for follow up of hypertension, depression, and type 2 diabetes with  acute concern of left lower leg pain. Please see the assessment and plans for the status of the patient chronic medical problems.   Past Medical History:  Diagnosis Date  . Anxiety   . Cardiomyopathy   . Degenerative joint disease   . Depression   . Diabetes mellitus    NIDDM  . Hypertension   . Obesities, morbid ([place])   . TIA (transient ischemic attack)    Past Surgical History:  Procedure Laterality Date  . CESAREAN SECTION     times 2  . EYE SURGERY     laser - right eye 2010  . left wrist     surgical repair  . ORIF ANKLE FRACTURE     right   Social History   Socioeconomic History  . Marital status: Divorced    Spouse name: Not on file  . Number of children: Not on file  . Years of education: Not on file  . Highest education level: Not on file  Social Needs  . Financial resource strain: Not on file  . Food insecurity - worry: Not on file  . Food insecurity - inability: Not on file  . Transportation needs - medical: Not on file  . Transportation needs - non-medical: Not on file  Occupational History  . Occupation: House keeping-retired since 2006    Employer: RETIRED  Tobacco Use  . Smoking status: Former Smoker    Types: Cigarettes  . Smokeless tobacco: Never Used  . Tobacco comment: Quit x 2 yrrs.  Substance and Sexual Activity  . Alcohol use: Yes    Frequency: Never    Comment: vodka with water on occasions   . Drug use: No    Comment: THC occasionally.  . Sexual activity: Not on file  Other Topics Concern  . Not on file  Social History Narrative   Lives in [place].   Review of Systems:  Refer to  history of present illness and assessment and plans for pertinent review of systems, all others reviewed and negative  Physical Exam:  Vitals:   12/06/17 1329 12/06/17 1330  BP: 130/84   Pulse: 90   Temp: 98.8 F (37.1 C)   TempSrc: Oral   SpO2: 98% 98%  Weight: 261 lb 11.2 oz (118.7 kg)   Height: 5\' 2"  (1.575 m)    General: morbidly obese, well appearing, no acute distress  Cardiac: RRR, no murmur appreciated, no peripheral edema  Pulm: lungs clear to auscultation, no respiratory distress Musculoskeletal: Bilateral knee exam without erythema, warmth, or tenderness over the joint line, she does have bilateral crepitus and bony enlargement. In the left leg I am not able to elicit tenderness in the popliteal fossa, lower leg, or ankle, the ankle ROM is intact and not limited by pain. Palpation of lateral hips bilaterally illicits mild tenderness. She is not able to ascend to the exam table for further hip testing.   Skin: Foot exam reveals strong pulses bilaterally, warm well perfused skin, clean and well groomed without signs of injury or ulceration  Assessment & Plan:   Leg pain  In October she began to have an aching pain on  the lateral side of her left leg. This began after she had to ascend from a lifted Lucianne Lei, she did not fall she thinks the pain is related to this trauma.  Pain becomes worse with walking, not relieved by topical analgesics or Tylenol 500 mg.  She does have lateral hip pain but denies symptoms of radiculopathy. No tenderness over the area on exam. No knee tenderness but exam is consistent with osteomyelitis and she describes sounds of snapping and popping of her knees without locking. The leg pain is in for injury related to some impact trauma sustained in October. -trial of Tylenol 1000 mg every 6 hours -Order placed for x-ray of the tibia and fibula  Type 2 Diabetes mellitus  Last A1c 7.4 in December of 2017.  She reports symptoms of hypoglycemia when she skips  meals.  She reports good compliance with her current prescriptions, glipizide 5 mg daily and Metformin 500 mg twice daily.  - A1c today 8.0, she is at goal for the elderly  - discontinue glipizide 5 mg qAM for hypoglycemia   - refilled metformin 500 BID  - ASCVD 10 year risk of stroke 36%, refilled high intensity Pravastatin 20 mg qd, last LDL 74  - on aspirin for stroke prevention, she believes this was started 7 years ago and may have a risk of stroke with discontinuation at this time  - foot exam today: feet are well groomed, continue to monitor  - last eye exam was in October 2018 and had no retinopathy, she will be due for follow-up eye exam in October  Morbid obesity  BMI is 47 with multiple comorbidities including type 2 diabetes, hypertension, and osteoarthritis.  Today we had a discussion about how loosing weight through healthy eating and light exercise could help to improve lower body joint pains. - Will need ongoing encouragement   Depression  PHQ- 2 is zero today, she does not report difficulties with mood.  - refilled sertraline 100 mg qd   Hypertension  BP Readings from Last 3 Encounters:  12/06/17 130/84  08/28/17 (!) 142/100  10/10/16 (!) 144/85  Blood pressure close to goal today.  - encouraged low sodium diet and increasing activity level  - refilled diltiazem 420 mg qd and olmesartan 40 mg qd   Health maintenance  - declines flu and pna vaccine  - referral for bone density and mammogram placed today   See Encounters Tab for problem based charting.  Patient discussed with Dr. Lynnae January

## 2017-12-06 NOTE — Assessment & Plan Note (Signed)
Last A1c 7.4 in December of 2017.  She reports symptoms of hypoglycemia when she skips meals.  She reports good compliance with her current prescriptions, glipizide 5 mg daily and Metformin 500 mg twice daily.  - A1c today 8.0, she is at goal for the elderly  - discontinue glipizide 5 mg qAM for hypoglycemia   - refilled metformin 500 BID  - ASCVD 10 year risk of stroke 36%, refilled high intensity Pravastatin 20 mg qd, last LDL 74  - on aspirin for stroke prevention, she believes this was started 7 years ago and may have a risk of stroke with discontinuation at this time  - foot exam today: feet are well groomed, continue to monitor  - last eye exam was in October 2018 and had no retinopathy, she will be due for follow-up eye exam in October

## 2017-12-06 NOTE — Assessment & Plan Note (Signed)
BP Readings from Last 3 Encounters:  12/06/17 130/84  08/28/17 (!) 142/100  10/10/16 (!) 144/85  Blood pressure close to goal today.  - encouraged low sodium diet and increasing activity level  - refilled diltiazem 420 mg qd and olmesartan 40 mg qd

## 2017-12-06 NOTE — Assessment & Plan Note (Signed)
No reported problems with atorvastatin.   - ASCVD 10 year risk of stroke 36%, refilled high intensity Pravastatin 20 mg qd, last LDL 74

## 2017-12-07 LAB — BMP8+ANION GAP
Anion Gap: 21 mmol/L — ABNORMAL HIGH (ref 10.0–18.0)
BUN/Creatinine Ratio: 17 (ref 12–28)
BUN: 12 mg/dL (ref 8–27)
CO2: 24 mmol/L (ref 20–29)
Calcium: 9.8 mg/dL (ref 8.7–10.3)
Chloride: 101 mmol/L (ref 96–106)
Creatinine, Ser: 0.71 mg/dL (ref 0.57–1.00)
GFR calc Af Amer: 97 mL/min/{1.73_m2} (ref >59)
GFR calc non Af Amer: 84 mL/min/{1.73_m2} (ref >59)
Glucose: 110 mg/dL — ABNORMAL HIGH (ref 65–99)
Potassium: 3.6 mmol/L (ref 3.5–5.2)
Sodium: 146 mmol/L — ABNORMAL HIGH (ref 134–144)

## 2017-12-08 NOTE — Progress Notes (Signed)
Internal Medicine Clinic Attending  Case discussed with Dr. Blum at the time of the visit.  We reviewed the resident's history and exam and pertinent patient test results.  I agree with the assessment, diagnosis, and plan of care documented in the resident's note. 

## 2018-01-08 ENCOUNTER — Encounter: Payer: Medicare HMO | Admitting: Internal Medicine

## 2018-01-11 ENCOUNTER — Encounter: Payer: Medicare HMO | Admitting: Internal Medicine

## 2018-02-08 ENCOUNTER — Other Ambulatory Visit: Payer: Self-pay | Admitting: Internal Medicine

## 2018-02-08 DIAGNOSIS — E119 Type 2 diabetes mellitus without complications: Secondary | ICD-10-CM

## 2018-02-08 DIAGNOSIS — I10 Essential (primary) hypertension: Secondary | ICD-10-CM

## 2018-02-08 DIAGNOSIS — F341 Dysthymic disorder: Secondary | ICD-10-CM

## 2018-03-27 DIAGNOSIS — R6889 Other general symptoms and signs: Secondary | ICD-10-CM | POA: Diagnosis not present

## 2018-03-27 DIAGNOSIS — L820 Inflamed seborrheic keratosis: Secondary | ICD-10-CM | POA: Diagnosis not present

## 2018-03-27 DIAGNOSIS — D22122 Melanocytic nevi of left lower eyelid, including canthus: Secondary | ICD-10-CM | POA: Diagnosis not present

## 2018-04-05 ENCOUNTER — Encounter: Payer: Self-pay | Admitting: Internal Medicine

## 2018-04-05 ENCOUNTER — Other Ambulatory Visit: Payer: Self-pay

## 2018-04-05 ENCOUNTER — Ambulatory Visit (INDEPENDENT_AMBULATORY_CARE_PROVIDER_SITE_OTHER): Payer: Medicare HMO | Admitting: Internal Medicine

## 2018-04-05 VITALS — BP 152/89 | HR 88 | Temp 98.2°F | Ht 62.0 in | Wt 256.5 lb

## 2018-04-05 DIAGNOSIS — I10 Essential (primary) hypertension: Secondary | ICD-10-CM | POA: Diagnosis not present

## 2018-04-05 DIAGNOSIS — Z79899 Other long term (current) drug therapy: Secondary | ICD-10-CM

## 2018-04-05 DIAGNOSIS — Z7984 Long term (current) use of oral hypoglycemic drugs: Secondary | ICD-10-CM

## 2018-04-05 DIAGNOSIS — Z6841 Body Mass Index (BMI) 40.0 and over, adult: Secondary | ICD-10-CM

## 2018-04-05 DIAGNOSIS — R42 Dizziness and giddiness: Secondary | ICD-10-CM | POA: Diagnosis not present

## 2018-04-05 DIAGNOSIS — E119 Type 2 diabetes mellitus without complications: Secondary | ICD-10-CM | POA: Diagnosis not present

## 2018-04-05 DIAGNOSIS — E1159 Type 2 diabetes mellitus with other circulatory complications: Secondary | ICD-10-CM

## 2018-04-05 DIAGNOSIS — M79605 Pain in left leg: Secondary | ICD-10-CM

## 2018-04-05 DIAGNOSIS — R6889 Other general symptoms and signs: Secondary | ICD-10-CM | POA: Diagnosis not present

## 2018-04-05 DIAGNOSIS — H8110 Benign paroxysmal vertigo, unspecified ear: Secondary | ICD-10-CM | POA: Insufficient documentation

## 2018-04-05 DIAGNOSIS — I152 Hypertension secondary to endocrine disorders: Secondary | ICD-10-CM

## 2018-04-05 LAB — POCT GLYCOSYLATED HEMOGLOBIN (HGB A1C): Hemoglobin A1C: 10 % — AB (ref 4.0–5.6)

## 2018-04-05 LAB — GLUCOSE, CAPILLARY: Glucose-Capillary: 341 mg/dL — ABNORMAL HIGH (ref 65–99)

## 2018-04-05 MED ORDER — GLIPIZIDE 5 MG PO TABS: 5.0000 mg | ORAL_TABLET | Freq: Every day | ORAL | 1 refills | Status: DC

## 2018-04-05 NOTE — Progress Notes (Signed)
   CC: High blood pressure  HPI:  [name] is a 72 y.o. female who presented to the clinic for continued evaluation and management of her chronic medical illnesses. For a detailed assessment and plan please refer to problem based charting.   Past Medical History:  Diagnosis Date  . Anxiety   . Cardiomyopathy   . Degenerative joint disease   . Depression   . Diabetes mellitus    NIDDM  . Hypertension   . Obesities, morbid ([place])   . TIA (transient ischemic attack)    Review of Systems:   Denies CP, SOB Denies Abdominal pain, N/V  Physical Exam: Vitals:   04/05/18 1318  BP: (!) 152/89  Pulse: 88  Temp: 98.2 F (36.8 C)  TempSrc: Oral  SpO2: 99%  Weight: 256 lb 8 oz (116.3 kg)  Height: 5\' 2"  (1.575 m)   General: Obese female in no acute distress Pulm: Good air movement with no wheezing or crackles  CV: RRR, no murmurs, no rubs  Abdomen: Active bowel sounds, soft, non-distended, no tenderness to palpation  Extremities: No LE edema  Assessment & Plan:   See Encounters Tab for problem based charting.  Patient discussed with Dr. Rebeca Alert

## 2018-04-05 NOTE — Assessment & Plan Note (Signed)
Patient's BP is above goal today. She is on diltiazem 420 mg QD and olmesartan 40 mg QD. She denies adverse effects of these medications. She denies orthostatic symptoms. Review of previous BP readings indicate that approximately 50% of the time she is at goal. We discussed starting another BP medication but it was deferred until our follow-up in 3 months.   Plan: - Continue diltiazem 420 mg QD and olmesartan 40 mg QD

## 2018-04-05 NOTE — Progress Notes (Signed)
Internal Medicine Clinic Attending  Case discussed with Dr. Tarri Abernethy  at the time of the visit.  We reviewed the resident's history and exam and pertinent patient test results.  I agree with the assessment, diagnosis, and plan of care documented in the resident's note.  A1C is rising after stopping glipizide. She would prefer to resume glipizide, but may be worth considering getting her on one of the novel agents such as GLP-1 agonist that could help with weight loss and blood pressure as well with lower risk of hypoglycemia.   For her probable BPPV, she can try home Epley maneuver as well and if resolved, could avoid the expense and time of going to PT.   Oda Kilts, MD

## 2018-04-05 NOTE — Assessment & Plan Note (Signed)
Patient have been experiencing periodic episodes of vertigo over the last couple of months. Episodes last <60 seconds and occur with rapid head movements. She has been evaluated for this in the past. She does not have any gait or truncal ataxia. She denies tinnitus or recent URI. We discussed that this is likely 2/2/ BPPV. We have referred her to PT for vestibular rehab.   Plan: -  Referred her to PT for vestibular rehab

## 2018-04-05 NOTE — Patient Instructions (Addendum)
GREAT JOB ON THE WEIGHT LOSS! In the last 12 months you have lost 21 pounds. That is amazing. Keep up the great work.   Today we made the following changes:  1. START glipizide 5 mg once daily   2. Try the Epley maneuver for your dizziness. If this does not help I have sent out a referral to physical therapy   3. Please come back in 3 months   How to Perform the Epley Maneuver The Epley maneuver is an exercise that relieves symptoms of vertigo. Vertigo is the feeling that you or your surroundings are moving when they are not. When you feel vertigo, you may feel like the room is spinning and have trouble walking. Dizziness is a little different than vertigo. When you are dizzy, you may feel unsteady or light-headed. You can do this maneuver at home whenever you have symptoms of vertigo. You can do it up to 3 times a day until your symptoms go away. Even though the Epley maneuver may relieve your vertigo for a few weeks, it is possible that your symptoms will return. This maneuver relieves vertigo, but it does not relieve dizziness. What are the risks? If it is done correctly, the Epley maneuver is considered safe. Sometimes it can lead to dizziness or nausea that goes away after a short time. If you develop other symptoms, such as changes in vision, weakness, or numbness, stop doing the maneuver and call your health care provider. How to perform the Epley maneuver 1. Sit on the edge of a bed or table with your back straight and your legs extended or hanging over the edge of the bed or table. 2. Turn your head halfway toward the affected ear or side. 3. Lie backward quickly with your head turned until you are lying flat on your back. You may want to position a pillow under your shoulders. 4. Hold this position for 30 seconds. You may experience an attack of vertigo. This is normal. 5. Turn your head to the opposite direction until your unaffected ear is facing the floor. 6. Hold this position for  30 seconds. You may experience an attack of vertigo. This is normal. Hold this position until the vertigo stops. 7. Turn your whole body to the same side as your head. Hold for another 30 seconds. 8. Sit back up. You can repeat this exercise up to 3 times a day. Follow these instructions at home:  After doing the Epley maneuver, you can return to your normal activities.  Ask your health care provider if there is anything you should do at home to prevent vertigo. He or she may recommend that you: ? Keep your head raised (elevated) with two or more pillows while you sleep. ? Do not sleep on the side of your affected ear. ? Get up slowly from bed. ? Avoid sudden movements during the day. ? Avoid extreme head movement, like looking up or bending over. Contact a health care provider if:  Your vertigo gets worse.  You have other symptoms, including: ? Nausea. ? Vomiting. ? Headache. Get help right away if:  You have vision changes.  You have a severe or worsening headache or neck pain.  You cannot stop vomiting.  You have new numbness or weakness in any part of your body. Summary  Vertigo is the feeling that you or your surroundings are moving when they are not.  The Epley maneuver is an exercise that relieves symptoms of vertigo.  If the Epley  maneuver is done correctly, it is considered safe. You can do it up to 3 times a day. This information is not intended to replace advice given to you by your health care provider. Make sure you discuss any questions you have with your health care provider. Document Released: 10/22/2013 Document Revised: 09/06/2016 Document Reviewed: 09/06/2016 Elsevier Interactive Patient Education  2017 [employer].

## 2018-04-05 NOTE — Assessment & Plan Note (Signed)
Patient seen in 12/2017. At that point her A1c was 8.0 but she was having some dizziness, weakness, and fatigue. It was felt this could be secondary to hypoglycemia and her glypizide was stopped. She has since been experiencing elevated CBGs and polyuria/polydipsia. She has lost 21 pounds in the past 12 months by dietary changes including cutting out beer, rice, pasta, and breads. She does not exercise daily.   A1c today is elevated to 10.0 and CBG was >300. We discussed restarting the glypizide for better glycemic control. She would prefer this. She will follow-up in 3 months and if her A1c remains high we will start a GLP-1 or increase her metformin.   Plan: - Continue Metformin 500 mg BID  - Continue Glypizide 5 mg QD  - Follow-up in 3 months. If A1c still elevated will start a GLP-1 or increase her metformin.

## 2018-04-05 NOTE — Assessment & Plan Note (Signed)
Resolved

## 2018-04-05 NOTE — Assessment & Plan Note (Signed)
Patient continues to work on weight loss. She has lost 21 lbs in the past 12 months. I congratulated her on this accomplishment and encouraged her to keep up the good work.

## 2018-04-25 ENCOUNTER — Telehealth: Payer: Self-pay | Admitting: *Deleted

## 2018-04-25 ENCOUNTER — Encounter: Payer: Self-pay | Admitting: *Deleted

## 2018-04-25 NOTE — Telephone Encounter (Signed)
Phone note opened in error - no call made or received at this time. Allison Caldwell, 04/25/18, 7:20P

## 2018-05-24 NOTE — Addendum Note (Signed)
Addended by: Hulan Fray on: 05/24/2018 03:08 PM   Modules accepted: Orders

## 2018-06-07 ENCOUNTER — Other Ambulatory Visit: Payer: Self-pay | Admitting: Internal Medicine

## 2018-06-07 DIAGNOSIS — I10 Essential (primary) hypertension: Secondary | ICD-10-CM

## 2018-06-08 ENCOUNTER — Other Ambulatory Visit: Payer: Self-pay | Admitting: Internal Medicine

## 2018-06-08 DIAGNOSIS — F341 Dysthymic disorder: Secondary | ICD-10-CM

## 2018-06-08 DIAGNOSIS — I10 Essential (primary) hypertension: Secondary | ICD-10-CM

## 2018-07-11 ENCOUNTER — Other Ambulatory Visit: Payer: Self-pay | Admitting: Internal Medicine

## 2018-07-11 DIAGNOSIS — E119 Type 2 diabetes mellitus without complications: Secondary | ICD-10-CM

## 2018-08-15 ENCOUNTER — Other Ambulatory Visit: Payer: Self-pay | Admitting: Internal Medicine

## 2018-08-15 DIAGNOSIS — F341 Dysthymic disorder: Secondary | ICD-10-CM

## 2018-08-23 DIAGNOSIS — H40012 Open angle with borderline findings, low risk, left eye: Secondary | ICD-10-CM | POA: Diagnosis not present

## 2018-08-23 DIAGNOSIS — H04123 Dry eye syndrome of bilateral lacrimal glands: Secondary | ICD-10-CM | POA: Diagnosis not present

## 2018-08-23 DIAGNOSIS — E119 Type 2 diabetes mellitus without complications: Secondary | ICD-10-CM | POA: Diagnosis not present

## 2018-08-23 DIAGNOSIS — H26491 Other secondary cataract, right eye: Secondary | ICD-10-CM | POA: Diagnosis not present

## 2018-08-23 DIAGNOSIS — R6889 Other general symptoms and signs: Secondary | ICD-10-CM | POA: Diagnosis not present

## 2018-08-23 DIAGNOSIS — H2512 Age-related nuclear cataract, left eye: Secondary | ICD-10-CM | POA: Diagnosis not present

## 2018-08-23 DIAGNOSIS — Z961 Presence of intraocular lens: Secondary | ICD-10-CM | POA: Diagnosis not present

## 2018-08-23 DIAGNOSIS — H401113 Primary open-angle glaucoma, right eye, severe stage: Secondary | ICD-10-CM | POA: Diagnosis not present

## 2018-09-05 ENCOUNTER — Other Ambulatory Visit: Payer: Self-pay | Admitting: Internal Medicine

## 2018-09-05 DIAGNOSIS — E119 Type 2 diabetes mellitus without complications: Secondary | ICD-10-CM

## 2018-10-15 ENCOUNTER — Encounter: Payer: Medicare HMO | Admitting: Internal Medicine

## 2018-11-12 ENCOUNTER — Other Ambulatory Visit: Payer: Self-pay | Admitting: Internal Medicine

## 2018-11-12 DIAGNOSIS — E119 Type 2 diabetes mellitus without complications: Secondary | ICD-10-CM

## 2018-12-20 ENCOUNTER — Encounter: Payer: Medicare HMO | Admitting: Internal Medicine

## 2019-01-20 ENCOUNTER — Other Ambulatory Visit: Payer: Self-pay | Admitting: Internal Medicine

## 2019-01-20 DIAGNOSIS — E119 Type 2 diabetes mellitus without complications: Secondary | ICD-10-CM

## 2019-01-29 ENCOUNTER — Telehealth: Payer: Self-pay | Admitting: Dietician

## 2019-01-29 DIAGNOSIS — E119 Type 2 diabetes mellitus without complications: Secondary | ICD-10-CM

## 2019-01-29 NOTE — Telephone Encounter (Signed)
Allison Caldwell is a 73 y.o. female who was contacted on behalf of [facility] Geriatrics Task Force.  Left voicemail 01/29/19 and 01/30/19.[place] with [name] by phone today. Her last a1c was 10% in June 2019. Lab Results  Component Value Date   HGBA1C 10.0 (A) 04/05/2018    She agreed to begin checking her blood sugar and says she knows how. She agreed to write the results down so she can tell us what they are over the phone more easily.   Diabetes Assessment  DM meds and BS checks -  "What medications are you taking for diabetes?" metformin- 500 mg twice a day, glipizide- 30 minutes before breakfast -  "How often do you check your blood sugars at home?" doesn't - "What have your blood sugars been?" been diabetic, can tell, low- feel a certain way sandwich or milk, 1/2 glass pop/cola, does not happen often- She agrees to have ameter sent to [facility] to begin checking her blood sugar one time a day and any time she feels funny.   High Blood Pressure Assessment  BP meds & BP checks -  "What medications are you taking for high blood pressure?" benicar, diltiazim - "How often do you check your blood pressure at home?" has a machine but need batteries - "What have your blood pressure readings been?" n/a  Coping with DM and BP - What else are you doing to help with your DM and BP -  - Diet? No sweets, moderate meals, 3 times a day, grits and eggs is a favorite, Her son is doing the shopping for her now.  - Exercise? None but stay in the bed. Encouraged her to do exercise in her bed, chair and to move/walk around the house to help her arthritis.   Medication Access Issues  Medication Issues? -  "Are you getting your medicines refilled on time without skipping any doses?" yes - "Are you having any problems getting/taking your meds (cost, timing, transportation)?" no- they  - Do you need any meds refilled? no  Conclusion  Close the call - Date of follow-up visit scheduled - 02/21/19- with Dr.  Tarri Abernethy - "Any other questions or concerns?" no.  Debera Lat, RD 01/31/2019 10:54 AM.

## 2019-01-31 MED ORDER — ACCU-CHEK GUIDE W/DEVICE KIT: 1.0000 | PACK | Freq: Every day | 1 refills | Status: AC

## 2019-01-31 MED ORDER — GLUCOSE BLOOD VI STRP: ORAL_STRIP | 4 refills | Status: DC

## 2019-01-31 MED ORDER — ACCU-CHEK FASTCLIX LANCETS MISC: 4 refills | Status: DC

## 2019-01-31 NOTE — Telephone Encounter (Signed)
Thanks for your help Butch Penny.

## 2019-02-21 ENCOUNTER — Ambulatory Visit: Payer: Medicare HMO | Admitting: Internal Medicine

## 2019-02-21 ENCOUNTER — Other Ambulatory Visit: Payer: Self-pay

## 2019-02-21 DIAGNOSIS — I10 Essential (primary) hypertension: Principal | ICD-10-CM

## 2019-02-21 DIAGNOSIS — E1159 Type 2 diabetes mellitus with other circulatory complications: Secondary | ICD-10-CM

## 2019-02-21 DIAGNOSIS — I152 Hypertension secondary to endocrine disorders: Secondary | ICD-10-CM

## 2019-02-21 NOTE — Progress Notes (Signed)
Attempted to call patient multiple times for continuity visit. No answer. 

## 2019-03-14 ENCOUNTER — Telehealth: Payer: Self-pay

## 2019-03-20 NOTE — Telephone Encounter (Signed)
Unable to reach.

## 2019-05-09 ENCOUNTER — Other Ambulatory Visit: Payer: Self-pay | Admitting: Internal Medicine

## 2019-05-09 DIAGNOSIS — I10 Essential (primary) hypertension: Secondary | ICD-10-CM

## 2019-05-21 ENCOUNTER — Other Ambulatory Visit: Payer: Self-pay | Admitting: Internal Medicine

## 2019-05-21 DIAGNOSIS — E119 Type 2 diabetes mellitus without complications: Secondary | ICD-10-CM

## 2019-07-01 ENCOUNTER — Other Ambulatory Visit: Payer: Self-pay | Admitting: Internal Medicine

## 2019-07-01 DIAGNOSIS — F341 Dysthymic disorder: Secondary | ICD-10-CM

## 2019-07-06 ENCOUNTER — Other Ambulatory Visit: Payer: Self-pay | Admitting: Internal Medicine

## 2019-07-06 DIAGNOSIS — I10 Essential (primary) hypertension: Secondary | ICD-10-CM

## 2019-07-09 NOTE — Telephone Encounter (Signed)
Approved 1 month refill of omlesartan. Patient needs follow up for further refills.

## 2019-07-16 ENCOUNTER — Other Ambulatory Visit: Payer: Self-pay | Admitting: Internal Medicine

## 2019-07-16 DIAGNOSIS — I10 Essential (primary) hypertension: Secondary | ICD-10-CM

## 2019-08-08 ENCOUNTER — Other Ambulatory Visit: Payer: Self-pay

## 2019-08-08 ENCOUNTER — Encounter: Payer: Self-pay | Admitting: Internal Medicine

## 2019-08-08 ENCOUNTER — Ambulatory Visit (INDEPENDENT_AMBULATORY_CARE_PROVIDER_SITE_OTHER): Payer: Medicare HMO | Admitting: Internal Medicine

## 2019-08-08 VITALS — BP 160/91 | HR 96 | Temp 98.7°F | Ht 62.0 in | Wt 239.5 lb

## 2019-08-08 DIAGNOSIS — R6889 Other general symptoms and signs: Secondary | ICD-10-CM | POA: Diagnosis not present

## 2019-08-08 DIAGNOSIS — F418 Other specified anxiety disorders: Secondary | ICD-10-CM | POA: Diagnosis not present

## 2019-08-08 DIAGNOSIS — I10 Essential (primary) hypertension: Secondary | ICD-10-CM

## 2019-08-08 DIAGNOSIS — E119 Type 2 diabetes mellitus without complications: Secondary | ICD-10-CM

## 2019-08-08 DIAGNOSIS — E1159 Type 2 diabetes mellitus with other circulatory complications: Secondary | ICD-10-CM

## 2019-08-08 DIAGNOSIS — Z7984 Long term (current) use of oral hypoglycemic drugs: Secondary | ICD-10-CM

## 2019-08-08 DIAGNOSIS — Z79899 Other long term (current) drug therapy: Secondary | ICD-10-CM

## 2019-08-08 DIAGNOSIS — I152 Hypertension secondary to endocrine disorders: Secondary | ICD-10-CM

## 2019-08-08 DIAGNOSIS — F341 Dysthymic disorder: Secondary | ICD-10-CM

## 2019-08-08 LAB — POCT GLYCOSYLATED HEMOGLOBIN (HGB A1C): Hemoglobin A1C: 5.7 % — AB (ref 4.0–5.6)

## 2019-08-08 LAB — GLUCOSE, CAPILLARY: Glucose-Capillary: 184 mg/dL — ABNORMAL HIGH (ref 70–99)

## 2019-08-08 MED ORDER — BUSPIRONE HCL 10 MG PO TABS: 10.0000 mg | ORAL_TABLET | Freq: Three times a day (TID) | ORAL | 0 refills | Status: DC

## 2019-08-08 NOTE — Assessment & Plan Note (Addendum)
HPI:  Patient presents for continued evaluation of her diabetes. Her last visit her hemoglobin A1c was found to be at 10. At that point glipizide 5 mg was started in addition to her metformin.  Since her last visit the patient has lost approximately 20 pounds. She states that she is cutting out high carb foods including bread, pastas, potatoes. She continues to live a sedentary lifestyle. She is been taking her medications as prescribed without any apparent side effects.  A/P: - Controlled. A1c down to 5.7 today. Will continue current medications including glipizide 5 mg daily and metformin 500 mg twice daily. - Continue olmesartan - Continue pravastatin  - Foot exam today  - Patient will call for diabetic eye exam  - Needs her PNA and influenza vaccine

## 2019-08-08 NOTE — Progress Notes (Signed)
   CC: HTN, DM, Anxiety  HPI:  [name] is a 73 y.o. female with PMHx listed below presenting for HTN, DM, Anxiety. Please see the A&P for the status of the patient's chronic medical problems.  Past Medical History:  Diagnosis Date  . Anxiety   . Cardiomyopathy   . Degenerative joint disease   . Depression   . Diabetes mellitus    NIDDM  . Hypertension   . Obesities, morbid ([place])   . TIA (transient ischemic attack)    Review of Systems:  Performed and all others negative.  Physical Exam: Vitals:   08/08/19 0924  BP: (!) 178/104  Pulse: (!) 102  Temp: 98.7 F (37.1 C)  TempSrc: Oral  SpO2: 100%  Weight: 239 lb 8 oz (108.6 kg)  Height: 5\' 2"  (1.575 m)   General: Obese female in no acute distress Pulm: Good air movement with no wheezing or crackles  CV: RRR, no murmurs, no rubs   Assessment & Plan:   See Encounters Tab for problem based charting.  Patient discussed with Dr. Daryll Drown

## 2019-08-08 NOTE — Progress Notes (Signed)
Internal Medicine Clinic Attending  Case discussed with Dr. Helberg  soon after the resident saw the patient.  We reviewed the resident's history and exam and pertinent patient test results.  I agree with the assessment, diagnosis, and plan of care documented in the resident's note.  

## 2019-08-08 NOTE — Patient Instructions (Addendum)
Thank you for allowing me to provide your care. Today we are:  1) Check some blood work to determine what blood pressure medication we should put you on.   2) Keep up the great work with your diabetes!  3) If you decide you would like to talk with someone about your anxiety please let me know. We are also starting a medication called buspirone 10 mg three times daily.   4) Please call [phone] to make your yearly eye appointment.   Please come back to see me in 3 months or sooner if there are any issues.

## 2019-08-08 NOTE — Assessment & Plan Note (Signed)
HPI:  Patient presents for continued evaluation of her hypertension. She is currently on diltiazem 420 mg QD and Olmesartan 40 mg QD. She is taking both these medications as prescribed. She denies any apparent side effects. She denies orthostatic symptoms.  A/P: - Uncontrolled. Will continue diltiazem 420 mg QD and Olmesartan 40 mg QD but will need another medication. We will check a BMP today. Likely start hydrochlorothiazide if the BMP is within normal limits.

## 2019-08-08 NOTE — Assessment & Plan Note (Signed)
HPI:  Patient presents for continued evaluation of her anxiety/depression. She is currently on sertraline 100 mg daily. This previously helped to control her anxiety and depression however over the last couple months she has noticed worsening anxiety. As result she has had a decreased appetite, inability to sleep, and periodic panic attacks. She lives alone and does not have much of a support system.  A/P: - Discussed referral to psychiatry. She is declined at this point. - Continue sertraline 100 mg daily. Will add augmentation therapy with buspirone 10 mg TID

## 2019-08-09 ENCOUNTER — Telehealth: Payer: Self-pay | Admitting: Internal Medicine

## 2019-08-09 DIAGNOSIS — I152 Hypertension secondary to endocrine disorders: Secondary | ICD-10-CM

## 2019-08-09 DIAGNOSIS — E1159 Type 2 diabetes mellitus with other circulatory complications: Secondary | ICD-10-CM

## 2019-08-09 LAB — BMP8+ANION GAP
Anion Gap: 19 mmol/L — ABNORMAL HIGH (ref 10.0–18.0)
BUN/Creatinine Ratio: 19 (ref 12–28)
BUN: 11 mg/dL (ref 8–27)
CO2: 22 mmol/L (ref 20–29)
Calcium: 9.2 mg/dL (ref 8.7–10.3)
Chloride: 102 mmol/L (ref 96–106)
Creatinine, Ser: 0.58 mg/dL (ref 0.57–1.00)
GFR calc Af Amer: 104 mL/min/{1.73_m2} (ref >59)
GFR calc non Af Amer: 90 mL/min/{1.73_m2} (ref >59)
Glucose: 109 mg/dL — ABNORMAL HIGH (ref 65–99)
Potassium: 3.6 mmol/L (ref 3.5–5.2)
Sodium: 143 mmol/L (ref 134–144)

## 2019-08-09 MED ORDER — HYDROCHLOROTHIAZIDE 25 MG PO TABS: 25.0000 mg | ORAL_TABLET | Freq: Every day | ORAL | 1 refills | Status: DC

## 2019-08-09 NOTE — Telephone Encounter (Signed)
Called patient about her lab work. We will start HCTZ today for BP control. She will need repeat labs in 4 weeks.   Allison Homes, MD

## 2019-08-12 ENCOUNTER — Telehealth: Payer: Self-pay

## 2019-08-12 ENCOUNTER — Other Ambulatory Visit: Payer: Self-pay | Admitting: Internal Medicine

## 2019-08-12 DIAGNOSIS — I10 Essential (primary) hypertension: Secondary | ICD-10-CM

## 2019-08-12 DIAGNOSIS — R52 Pain, unspecified: Secondary | ICD-10-CM

## 2019-08-12 NOTE — Telephone Encounter (Signed)
Declining medication. This is an old prescription from 2017.

## 2019-08-12 NOTE — Telephone Encounter (Signed)
naproxen (NAPROSYN) 500 MG tablet   Is not at [facility], please call pt back.

## 2019-08-23 ENCOUNTER — Other Ambulatory Visit: Payer: Self-pay | Admitting: Internal Medicine

## 2019-08-23 DIAGNOSIS — E119 Type 2 diabetes mellitus without complications: Secondary | ICD-10-CM

## 2019-11-21 ENCOUNTER — Other Ambulatory Visit: Payer: Self-pay | Admitting: Internal Medicine

## 2019-11-21 DIAGNOSIS — E119 Type 2 diabetes mellitus without complications: Secondary | ICD-10-CM

## 2019-11-24 ENCOUNTER — Other Ambulatory Visit: Payer: Self-pay | Admitting: Internal Medicine

## 2019-11-24 DIAGNOSIS — F341 Dysthymic disorder: Secondary | ICD-10-CM

## 2019-12-09 ENCOUNTER — Other Ambulatory Visit: Payer: Self-pay | Admitting: Internal Medicine

## 2019-12-09 DIAGNOSIS — E119 Type 2 diabetes mellitus without complications: Secondary | ICD-10-CM

## 2019-12-09 MED ORDER — METFORMIN HCL 500 MG PO TABS: 500.0000 mg | ORAL_TABLET | Freq: Two times a day (BID) | ORAL | 0 refills | Status: DC

## 2019-12-09 MED ORDER — GLIPIZIDE 5 MG PO TABS: 5.0000 mg | ORAL_TABLET | Freq: Every day | ORAL | 0 refills | Status: DC

## 2019-12-09 NOTE — Telephone Encounter (Signed)
Overdue for PCP DM appt. Pls sch

## 2019-12-09 NOTE — Telephone Encounter (Signed)
Refill Request-  Pt is in [place] for a funeral and the patient forgot her Diabetes medication. Please call daughter back as te patient has been without her medication for 1 day or so.   metFORMIN (GLUCOPHAGE) 500 MG tablet  Please send prescription to   [facility] Address: [address], [place] [address] Departments: [place]   Phone: [phone]

## 2019-12-13 NOTE — Telephone Encounter (Signed)
Appt has been sch for 01/09/2020 with Dr. Tarri Abernethy.

## 2020-01-02 ENCOUNTER — Other Ambulatory Visit: Payer: Self-pay | Admitting: Internal Medicine

## 2020-01-02 DIAGNOSIS — E1159 Type 2 diabetes mellitus with other circulatory complications: Secondary | ICD-10-CM

## 2020-01-02 DIAGNOSIS — I152 Hypertension secondary to endocrine disorders: Secondary | ICD-10-CM

## 2020-01-09 ENCOUNTER — Ambulatory Visit: Payer: Medicare HMO

## 2020-02-15 ENCOUNTER — Other Ambulatory Visit: Payer: Self-pay | Admitting: Internal Medicine

## 2020-02-15 DIAGNOSIS — I10 Essential (primary) hypertension: Secondary | ICD-10-CM

## 2020-04-10 ENCOUNTER — Other Ambulatory Visit: Payer: Self-pay | Admitting: Internal Medicine

## 2020-04-10 DIAGNOSIS — F341 Dysthymic disorder: Secondary | ICD-10-CM

## 2020-04-23 ENCOUNTER — Encounter: Payer: Medicare HMO | Admitting: Internal Medicine

## 2020-04-23 NOTE — Progress Notes (Deleted)
   CC: HTN, DM, Anxiety/depression  HPI:  Ms.Nakeita Claryssa Sandner is a 74 y.o. female with PMHx listed below presenting for HTN, DM, anxiety/depression. Please see the A&P for the status of the patient's chronic medical problems.  Past Medical History:  Diagnosis Date  . Anxiety   . Cardiomyopathy   . Degenerative joint disease   . Depression   . Diabetes mellitus    NIDDM  . Hypertension   . Obesities, morbid ([place])   . TIA (transient ischemic attack)    Review of Systems: Performed and all others negative.  Physical Exam: There were no vitals filed for this visit. General: Well nourished *** in no acute distress HENT: Normocephalic, atraumatic, moist mucus membranes Pulm: Good air movement with no wheezing or crackles  CV: RRR, no murmurs, no rubs  Abdomen: Active bowel sounds, soft, non-distended, no tenderness to palpation  Extremities: Pulses palpable in all extremities, no LE edema  Skin: Warm and dry  Neuro: Alert and oriented x 3  Assessment & Plan:   See Encounters Tab for problem based charting.  Patient {GC/GE:3044014::"discussed with","seen with"} Dr. {NAMES:[id number]::"Butcher","Guilloud","Hoffman","Mullen","Narendra","Raines","Vincent"}

## 2020-04-27 ENCOUNTER — Encounter: Payer: Self-pay | Admitting: *Deleted

## 2020-05-14 ENCOUNTER — Other Ambulatory Visit: Payer: Self-pay | Admitting: Internal Medicine

## 2020-05-14 DIAGNOSIS — E119 Type 2 diabetes mellitus without complications: Secondary | ICD-10-CM

## 2020-05-30 ENCOUNTER — Other Ambulatory Visit: Payer: Self-pay | Admitting: Internal Medicine

## 2020-05-30 DIAGNOSIS — I10 Essential (primary) hypertension: Secondary | ICD-10-CM

## 2020-06-30 ENCOUNTER — Encounter: Payer: Medicare HMO | Admitting: Student

## 2020-07-30 ENCOUNTER — Other Ambulatory Visit: Payer: Self-pay

## 2020-07-30 DIAGNOSIS — I10 Essential (primary) hypertension: Secondary | ICD-10-CM

## 2020-07-30 MED ORDER — DILTIAZEM HCL ER BEADS 420 MG PO CP24: 420.0000 mg | ORAL_CAPSULE | Freq: Every day | ORAL | 1 refills | Status: DC

## 2020-07-30 MED ORDER — METFORMIN HCL 500 MG PO TABS: 500.0000 mg | ORAL_TABLET | Freq: Two times a day (BID) | ORAL | 2 refills | Status: DC

## 2020-08-04 ENCOUNTER — Other Ambulatory Visit: Payer: Self-pay

## 2020-08-04 DIAGNOSIS — E1159 Type 2 diabetes mellitus with other circulatory complications: Secondary | ICD-10-CM

## 2020-08-04 DIAGNOSIS — I152 Hypertension secondary to endocrine disorders: Secondary | ICD-10-CM

## 2020-08-04 MED ORDER — HYDROCHLOROTHIAZIDE 25 MG PO TABS: 25.0000 mg | ORAL_TABLET | Freq: Every day | ORAL | 1 refills | Status: DC

## 2020-09-01 ENCOUNTER — Encounter: Payer: Medicare HMO | Admitting: Student

## 2020-09-01 ENCOUNTER — Other Ambulatory Visit: Payer: Self-pay

## 2020-09-01 NOTE — Progress Notes (Signed)
This encounter was created in error - please disregard.

## 2020-09-01 NOTE — Progress Notes (Deleted)
CC: ***  HPI:  [name] is a 74 y.o. female with a past medical history stated below and presents today for ***. Please see problem based assessment and plan for additional details.  Past Medical History:  Diagnosis Date  . Anxiety   . Cardiomyopathy   . Degenerative joint disease   . Depression   . Diabetes mellitus    NIDDM  . Hypertension   . Obesities, morbid ([place])   . TIA (transient ischemic attack)     Current Outpatient Medications on File Prior to Visit  Medication Sig Dispense Refill  . Accu-Chek FastClix Lancets MISC Check blood sugar 1 time a day 102 each 4  . acetaminophen (TYLENOL) 500 MG tablet Take 2 tablets (1,000 mg total) by mouth every 6 (six) hours as needed. 120 tablet 0  . aspirin EC 81 MG tablet Take 1 tablet (81 mg total) by mouth every morning. 90 tablet 3  . Blood Glucose Monitoring Suppl (ACCU-CHEK GUIDE) w/Device KIT 1 each by Does not apply route daily. Check blood sugar 1 time a day 1 kit 1  . busPIRone (BUSPAR) 10 MG tablet Take 1 tablet (10 mg total) by mouth 3 (three) times daily. 90 tablet 0  . diltiazem (TIAZAC) 420 MG 24 hr capsule Take 1 capsule (420 mg total) by mouth daily. 90 capsule 1  . glipiZIDE (GLUCOTROL) 5 MG tablet Take 1 tablet (5 mg total) by mouth daily before breakfast. 15 tablet 0  . glucose blood (ACCU-CHEK GUIDE) test strip Check blood sugar 1 time per day 100 each 4  . hydrochlorothiazide (HYDRODIURIL) 25 MG tablet Take 1 tablet (25 mg total) by mouth daily. 90 tablet 1  . metFORMIN (GLUCOPHAGE) 500 MG tablet Take 1 tablet (500 mg total) by mouth 2 (two) times daily with a meal. 60 tablet 2  . Multiple Vitamin (MULTIVITAMIN WITH MINERALS) TABS tablet Take 1 tablet by mouth daily. 90 tablet 3  . naproxen (NAPROSYN) 500 MG tablet Take 1 tablet (500 mg total) by mouth 2 (two) times daily with a meal. 60 tablet 0  . olmesartan (BENICAR) 40 MG tablet TAKE 1 TABLET AT BEDTIME 90 tablet 3  . pravastatin (PRAVACHOL)  20 MG tablet TAKE 1 TABLET (20 MG TOTAL) BY MOUTH AT BEDTIME. 90 tablet 1  . sertraline (ZOLOFT) 100 MG tablet TAKE 1 TABLET EVERY MORNING 90 tablet 1   No current facility-administered medications on file prior to visit.    Family History  Problem Relation Age of Onset  . Heart disease Mother   . Cancer Father   . Diabetes Sister   . Kidney disease Brother   . Stroke Brother     Social History   Socioeconomic History  . Marital status: Divorced    Spouse name: Not on file  . Number of children: Not on file  . Years of education: Not on file  . Highest education level: Not on file  Occupational History  . Occupation: House keeping-retired since 2006    Employer: RETIRED  Tobacco Use  . Smoking status: Former Smoker    Types: Cigarettes  . Smokeless tobacco: Never Used  . Tobacco comment: Quit x 2 yrrs.  Substance and Sexual Activity  . Alcohol use: Yes    Comment: vodka with water on occasions   . Drug use: No    Comment: THC occasionally.  . Sexual activity: Not on file  Other Topics Concern  . Not on file  Social History Narrative  Lives in [place].   Social Determinants of Health   Financial Resource Strain:   . Difficulty of Paying Living Expenses: Not on file  Food Insecurity:   . Worried About Charity fundraiser in the Last Year: Not on file  . Ran Out of Food in the Last Year: Not on file  Transportation Needs:   . Lack of Transportation (Medical): Not on file  . Lack of Transportation (Non-Medical): Not on file  Physical Activity:   . Days of Exercise per Week: Not on file  . Minutes of Exercise per Session: Not on file  Stress:   . Feeling of Stress : Not on file  Social Connections:   . Frequency of Communication with Friends and Family: Not on file  . Frequency of Social Gatherings with Friends and Family: Not on file  . Attends Religious Services: Not on file  . Active Member of Clubs or Organizations: Not on file  . Attends Theatre manager Meetings: Not on file  . Marital Status: Not on file  Intimate Partner Violence:   . Fear of Current or Ex-Partner: Not on file  . Emotionally Abused: Not on file  . Physically Abused: Not on file  . Sexually Abused: Not on file    Review of Systems: ROS negative except for what is noted on the assessment and plan.  There were no vitals filed for this visit.   Physical Exam: Physical Exam   Assessment & Plan:   See Encounters Tab for problem based charting.  Patient {GC/GE:3044014::"discussed with","seen with"} Dr. {HUDJS:[id number]::"CHYIFOYD","X. Hoffman","Mullen","Narendra","Raines","Vincent","Guilloud"}  Sanjuana Letters, D.O. [facility], PGY-2 Pager: [phone], Phone: [phone] Date 09/01/2020 Time 7:03 AM

## 2020-09-21 ENCOUNTER — Encounter: Payer: Medicare HMO | Admitting: Student

## 2020-09-28 ENCOUNTER — Other Ambulatory Visit: Payer: Self-pay

## 2020-09-28 ENCOUNTER — Ambulatory Visit (INDEPENDENT_AMBULATORY_CARE_PROVIDER_SITE_OTHER): Payer: Medicare HMO | Admitting: Student

## 2020-09-28 VITALS — BP 151/83 | HR 80 | Temp 98.4°F | Wt 250.7 lb

## 2020-09-28 DIAGNOSIS — I152 Hypertension secondary to endocrine disorders: Secondary | ICD-10-CM | POA: Diagnosis not present

## 2020-09-28 DIAGNOSIS — E119 Type 2 diabetes mellitus without complications: Secondary | ICD-10-CM

## 2020-09-28 DIAGNOSIS — R011 Cardiac murmur, unspecified: Secondary | ICD-10-CM

## 2020-09-28 DIAGNOSIS — E1159 Type 2 diabetes mellitus with other circulatory complications: Secondary | ICD-10-CM | POA: Diagnosis not present

## 2020-09-28 LAB — GLUCOSE, CAPILLARY: Glucose-Capillary: 179 mg/dL — ABNORMAL HIGH (ref 70–99)

## 2020-09-28 LAB — POCT GLYCOSYLATED HEMOGLOBIN (HGB A1C): Hemoglobin A1C: 5.9 % — AB (ref 4.0–5.6)

## 2020-09-28 MED ORDER — ACCU-CHEK FASTCLIX LANCETS MISC: 4 refills | Status: AC

## 2020-09-28 MED ORDER — ACCU-CHEK GUIDE VI STRP: ORAL_STRIP | 4 refills | Status: AC

## 2020-09-28 NOTE — Progress Notes (Signed)
CC: hyperglycemia  HPI:  [name] is a 74 y.o. female with a past medical history stated below and presents today for hyperglycemia. Please see problem based assessment and plan for additional details.  Past Medical History:  Diagnosis Date   Anxiety    Cardiomyopathy    Degenerative joint disease    Depression    Diabetes mellitus    NIDDM   Hypertension    Obesities, morbid ([facility])    TIA (transient ischemic attack)     Current Outpatient Medications on File Prior to Visit  Medication Sig Dispense Refill   acetaminophen (TYLENOL) 500 MG tablet Take 2 tablets (1,000 mg total) by mouth every 6 (six) hours as needed. 120 tablet 0   aspirin EC 81 MG tablet Take 1 tablet (81 mg total) by mouth every morning. 90 tablet 3   Blood Glucose Monitoring Suppl (ACCU-CHEK GUIDE) w/Device KIT 1 each by Does not apply route daily. Check blood sugar 1 time a day 1 kit 1   busPIRone (BUSPAR) 10 MG tablet Take 1 tablet (10 mg total) by mouth 3 (three) times daily. 90 tablet 0   diltiazem (TIAZAC) 420 MG 24 hr capsule Take 1 capsule (420 mg total) by mouth daily. 90 capsule 1   hydrochlorothiazide (HYDRODIURIL) 25 MG tablet Take 1 tablet (25 mg total) by mouth daily. 90 tablet 1   metFORMIN (GLUCOPHAGE) 500 MG tablet Take 1 tablet (500 mg total) by mouth 2 (two) times daily with a meal. 60 tablet 2   Multiple Vitamin (MULTIVITAMIN WITH MINERALS) TABS tablet Take 1 tablet by mouth daily. 90 tablet 3   naproxen (NAPROSYN) 500 MG tablet Take 1 tablet (500 mg total) by mouth 2 (two) times daily with a meal. 60 tablet 0   olmesartan (BENICAR) 40 MG tablet TAKE 1 TABLET AT BEDTIME 90 tablet 3   pravastatin (PRAVACHOL) 20 MG tablet TAKE 1 TABLET (20 MG TOTAL) BY MOUTH AT BEDTIME. 90 tablet 1   sertraline (ZOLOFT) 100 MG tablet TAKE 1 TABLET EVERY MORNING 90 tablet 1   No current facility-administered medications on file prior to visit.    Family History  Problem  Relation Age of Onset   Heart disease Mother    Cancer Father    Diabetes Sister    Kidney disease Brother    Stroke Brother     Social History   Socioeconomic History   Marital status: Divorced    Spouse name: Not on file   Number of children: Not on file   Years of education: Not on file   Highest education level: Not on file  Occupational History   Occupation: House keeping-retired since 2006    Employer: RETIRED  Tobacco Use   Smoking status: Former Smoker    Types: Cigarettes   Smokeless tobacco: Never Used   Tobacco comment: Quit x 2 yrrs.  Substance and Sexual Activity   Alcohol use: Yes    Comment: vodka with water on occasions    Drug use: No    Comment: THC occasionally.   Sexual activity: Not on file  Other Topics Concern   Not on file  Social History Narrative   Lives in [place].   Social Determinants of Health   Financial Resource Strain:    Difficulty of Paying Living Expenses: Not on file  Food Insecurity:    Worried About Charity fundraiser in the Last Year: Not on file   [employer] of Food in the Last Year:  Not on file  Transportation Needs:    Lack of Transportation (Medical): Not on file   Lack of Transportation (Non-Medical): Not on file  Physical Activity:    Days of Exercise per Week: Not on file   Minutes of Exercise per Session: Not on file  Stress:    Feeling of Stress : Not on file  Social Connections:    Frequency of Communication with Friends and Family: Not on file   Frequency of Social Gatherings with Friends and Family: Not on file   Attends Religious Services: Not on file   Active Member of Clubs or Organizations: Not on file   Attends Archivist Meetings: Not on file   Marital Status: Not on file  Intimate Partner Violence:    Fear of Current or Ex-Partner: Not on file   Emotionally Abused: Not on file   Physically Abused: Not on file   Sexually Abused: Not on file    Review  of Systems: ROS negative except for what is noted on the assessment and plan.  Vitals:   09/28/20 1544  BP: (!) 151/83  Pulse: 80  Temp: 98.4 F (36.9 C)  TempSrc: Oral  SpO2: 98%  Weight: 250 lb 11.2 oz (113.7 kg)     Physical Exam: Physical Exam Vitals and nursing note reviewed.  Constitutional:      Appearance: Normal appearance.  HENT:     Head: Normocephalic and atraumatic.  Cardiovascular:     Rate and Rhythm: Normal rate and regular rhythm.     Heart sounds: Murmur (3/6 systolic, best heard over right 2nd intercostal space) heard.   Pulmonary:     Effort: No respiratory distress.     Breath sounds: Normal breath sounds. No stridor. No wheezing or rales.  Musculoskeletal:     Right lower leg: No edema.     Left lower leg: No edema.  Skin:    General: Skin is warm and dry.  Neurological:     Mental Status: She is alert and oriented to person, place, and time. Mental status is at baseline.  Psychiatric:        Mood and Affect: Mood normal.        Behavior: Behavior normal.      Assessment & Plan:   See Encounters Tab for problem based charting.  Patient seen with Dr. Caffie Damme, D.O. [facility], PGY-1 Pager: [phone], Phone: [phone] Date 09/28/2020 Time 10:23 PM

## 2020-09-28 NOTE — Assessment & Plan Note (Addendum)
Assessment: BP today of 151/83. Current regimen of diltiazem 420 mg daily, HCTZ 25 mg daily, and olmesartan 40 mg tab. Patient has not taken HCTZ for the past few months, she started retaking the medicine last week.   She notes that moving forward she will be compliant with her blood pressure medication. Patient currently on maximum dose of olmesartan and HCTZ. If patient compliant with medication and continues to be hypertensive, will consider alternative regimen.  Plan: - continue current therapy of HCTZ, olmesartan, and diltiazem - if continues to be hypertensive, consider alternative agents.  - patient to follow up in one month

## 2020-09-28 NOTE — Assessment & Plan Note (Addendum)
Assessment: Lab Results  Component Value Date   HGBA1C 5.9 (A) 09/28/2020  A1c elevated from 5.7. today. Patient continues to be below goal of 7.0. Patient notes that she has not been taking glipizide recently. Per patient's prior notes, it appears she had episodes of hypoglycemia with glipizide and that is why it was discontinued. Because of how well her A1c has done and with a decrease of 5% over the past year, concerned she may be having episodes of hypoglycemia.  As such, will discontinue glipizide. Will continue metformin 500 mg BID. Patient given glucose monitor and strips/lancets ordered.   Patient instructed to check sugars daily.   Plan: - Discontinue glipizide 5 mg - Continue metformin 500 mg BID - Patient given glucose monitor, instructed to check sugars daily. - Recheck A1c in 3 months - BM8 ordered

## 2020-09-28 NOTE — Patient Instructions (Signed)
Thank you, [name] for allowing [place] to provide your care today. Today we discussed;  Diabetes - Your A1c was 5.9 today from 5.7. We will be stopping your glipizide. Please continue to take your metformin. We will also be giving you a glucometer to check your sugars. It comes with 10 strips and lancets, I will be ordering more for you to pickup at your pharmacy.   Blood pressure- Please take all of your blood pressure medications, your olmesartan, diltiazem, and hydrochlorothiazide. Please check your blood pressure daily and bring these in next month. We may change your blood pressure regimen.   Heart Murmur - Today we detected a heart murmur. We will be ordering an echocardiogram (picture of your heart) to better assess this.   I have ordered the following labs for you:   Lab Orders     Glucose, capillary     BMP8+Anion Gap     POC Hbg A1C    Referrals ordered today:   Referral Orders  No referral(s) requested today     I have ordered the following medication/changed the following medications:   Stop the following medications: Medications Discontinued During This Encounter  Medication Reason  . glipiZIDE (GLUCOTROL) 5 MG tablet Side effect (s)     Start the following medications: No orders of the defined types were placed in this encounter.    Follow up: 1 month    Remember: Check your blood pressure twice daily and record them. Bring them in to your next visit in a month.   Should you have any questions or concerns please call the internal medicine clinic at [phone].     Sanjuana Letters, D.O. [place]

## 2020-09-28 NOTE — Assessment & Plan Note (Signed)
Assessment: 3/6 systolic murmur appreciated, best heard over right second intercostal space. Patient's last echo in 2011; Aortic valve: Trivial regurgitation and mitral valve: Mild regurgitation.   Patient does not appear to be having symptoms of valvular insufficiency, however, due to prior echocardiogram showing some valvular disease, will order repeat echocardiogram for further evaluation.   Plan: - Echocardiogram ordered

## 2020-09-29 ENCOUNTER — Other Ambulatory Visit: Payer: Self-pay | Admitting: Internal Medicine

## 2020-09-29 LAB — BMP8+ANION GAP
Anion Gap: 20 mmol/L — ABNORMAL HIGH (ref 10.0–18.0)
BUN/Creatinine Ratio: 25 (ref 12–28)
BUN: 17 mg/dL (ref 8–27)
CO2: 24 mmol/L (ref 20–29)
Calcium: 9.3 mg/dL (ref 8.7–10.3)
Chloride: 100 mmol/L (ref 96–106)
Creatinine, Ser: 0.69 mg/dL (ref 0.57–1.00)
GFR calc Af Amer: 98 mL/min/{1.73_m2} (ref >59)
GFR calc non Af Amer: 85 mL/min/{1.73_m2} (ref >59)
Glucose: 162 mg/dL — ABNORMAL HIGH (ref 65–99)
Potassium: 3.9 mmol/L (ref 3.5–5.2)
Sodium: 144 mmol/L (ref 134–144)

## 2020-09-30 NOTE — Progress Notes (Signed)
Internal Medicine Clinic Attending  I saw and evaluated the patient.  I personally confirmed the key portions of the history and exam documented by Dr. Katsadouros and I reviewed pertinent patient test results.  The assessment, diagnosis, and plan were formulated together and I agree with the documentation in the resident's note.  

## 2020-10-02 ENCOUNTER — Telehealth: Payer: Self-pay | Admitting: Internal Medicine

## 2020-10-02 ENCOUNTER — Telehealth: Payer: Self-pay

## 2020-10-02 NOTE — Telephone Encounter (Signed)
Called Ms. Skluzacek to discuss labs. Overall stable BMP and A1C

## 2020-10-02 NOTE — Telephone Encounter (Signed)
TC received from patient who states she missed a call from [facility].  She thinks it is about her lab results and is asking for MD to call her back to discuss results from 09/28/20 visit. SChaplin, RN,BSN

## 2020-10-20 ENCOUNTER — Other Ambulatory Visit: Payer: Self-pay

## 2020-10-20 DIAGNOSIS — F341 Dysthymic disorder: Secondary | ICD-10-CM

## 2020-10-20 MED ORDER — SERTRALINE HCL 100 MG PO TABS: 100.0000 mg | ORAL_TABLET | Freq: Every morning | ORAL | 1 refills | Status: DC

## 2020-11-20 ENCOUNTER — Ambulatory Visit (HOSPITAL_COMMUNITY): Payer: Medicare HMO

## 2020-12-04 ENCOUNTER — Encounter: Payer: Self-pay | Admitting: *Deleted

## 2020-12-04 NOTE — Progress Notes (Signed)

## 2020-12-08 NOTE — Progress Notes (Signed)
Things That May Be Affecting Your Health:  Alcohol  Hearing loss  Pain    Depression  Home Safety  Sexual Health  X Diabetes X Lack of physical activity  Stress   Difficulty with daily activities  Loneliness  Tiredness   Drug use  Medicines  Tobacco use   Falls  Motor Vehicle Safety X Weight   Food choices  Oral Health  Other    YOUR PERSONALIZED HEALTH PLAN : 1. Schedule your next subsequent Medicare Wellness visit in one year 2. Attend all of your regular appointments to address your medical issues 3. Complete the preventative screenings and services   Annual Wellness Visit   Medicare Covered Preventative Screenings and [place] Men and Women Who How Often Need? Date of Last Service Action  Abdominal Aortic Aneurysm Adults with AAA risk factors Once     Alcohol Misuse and Counseling All Adults Screening once a year if no alcohol misuse. Counseling up to 4 face to face sessions.     Bone Density Measurement  Adults at risk for osteoporosis Once every 2 yrs X    Lipid Panel Z13.6 All adults without CV disease Once every 5 yrs     Colorectal Cancer   Stool sample or  Colonoscopy All adults 35 and older   Once every year  Every 10 years     Depression All Adults Once a year  Today   Diabetes Screening Blood glucose, post glucose load, or GTT Z13.1  All adults at risk  Pre-diabetics  Once per year  Twice per year     Diabetes  Self-Management Training All adults Diabetics 10 hrs first year; 2 hours subsequent years. Requires Copay     Glaucoma  Diabetics  Family history of glaucoma  African Americans 8 yrs +  Hispanic Americans 43 yrs + Annually - requires coppay     Hepatitis C Z72.89 or F19.20  High Risk for HCV  Born between 1945 and 1965  Annually  Once     HIV Z11.4 All adults based on risk  Annually btw ages 32 & 60 regardless of risk  Annually > 65 yrs if at increased risk     Lung Cancer Screening Asymptomatic adults aged  17-77 with 30 pack yr history and current smoker OR quit within the last 15 yrs Annually Must have counseling and shared decision making documentation before first screen     Medical Nutrition Therapy Adults with   Diabetes  Renal disease  Kidney transplant within past 3 yrs 3 hours first year; 2 hours subsequent years X    Obesity and Counseling All adults Screening once a year Counseling if BMI 30 or higher X Today   Tobacco Use Counseling Adults who use tobacco  Up to 8 visits in one year     Vaccines Z23  Hepatitis B  Influenza   Pneumonia  Adults   Once  Once every flu season  Two different vaccines separated by one year     Next Annual Wellness Visit People with Medicare Every year  Today     Services & Screenings Women Who How Often Need  Date of Last Service Action  Mammogram  Z12.31 Women over 32 One baseline ages 38-39. Annually ager 40 yrs+     Pap tests All women Annually if high risk. Every 2 yrs for normal risk women     Screening for cervical cancer with   Pap (Z01.419 nl or Z01.411abnl) &  HPV Z11.51 Women aged 52 to 70 Once every 5 yrs X    Screening pelvic and breast exams All women Annually if high risk. Every 2 yrs for normal risk women     Sexually Transmitted Diseases  Chlamydia  Gonorrhea  Syphilis All at risk adults Annually for non pregnant females at increased risk         [place] Men Who How Ofter Need  Date of Last Service Action  Prostate Cancer - DRE & PSA Men over 50 Annually.  DRE might require a copay.     Sexually Transmitted Diseases  Syphilis All at risk adults Annually for men at increased risk

## 2020-12-18 ENCOUNTER — Other Ambulatory Visit: Payer: Self-pay | Admitting: Internal Medicine

## 2020-12-18 DIAGNOSIS — E1159 Type 2 diabetes mellitus with other circulatory complications: Secondary | ICD-10-CM

## 2020-12-18 DIAGNOSIS — I152 Hypertension secondary to endocrine disorders: Secondary | ICD-10-CM

## 2020-12-18 DIAGNOSIS — I10 Essential (primary) hypertension: Secondary | ICD-10-CM

## 2020-12-21 ENCOUNTER — Ambulatory Visit (HOSPITAL_COMMUNITY): Admission: RE | Admit: 2020-12-21 | Payer: Medicare HMO | Source: Ambulatory Visit

## 2021-02-16 ENCOUNTER — Other Ambulatory Visit: Payer: Self-pay | Admitting: Internal Medicine

## 2021-02-16 DIAGNOSIS — E119 Type 2 diabetes mellitus without complications: Secondary | ICD-10-CM

## 2021-04-15 ENCOUNTER — Other Ambulatory Visit: Payer: Self-pay | Admitting: Student

## 2021-04-15 DIAGNOSIS — F341 Dysthymic disorder: Secondary | ICD-10-CM

## 2021-04-21 ENCOUNTER — Other Ambulatory Visit: Payer: Self-pay | Admitting: Internal Medicine

## 2021-05-12 ENCOUNTER — Other Ambulatory Visit: Payer: Self-pay | Admitting: Internal Medicine

## 2021-05-12 DIAGNOSIS — I10 Essential (primary) hypertension: Secondary | ICD-10-CM

## 2021-05-12 DIAGNOSIS — E1159 Type 2 diabetes mellitus with other circulatory complications: Secondary | ICD-10-CM

## 2021-05-12 DIAGNOSIS — I152 Hypertension secondary to endocrine disorders: Secondary | ICD-10-CM

## 2021-05-12 NOTE — Telephone Encounter (Signed)
Can we please schedule Allison Caldwell to be seen for a blood pressure check?  Thanks! Dr Raliegh Ip

## 2021-05-12 NOTE — Telephone Encounter (Signed)
[place] office - can you schedule pt an appt for BP check per Dr Johnney Ou  Thanks

## 2021-05-13 ENCOUNTER — Encounter: Payer: Self-pay | Admitting: Student

## 2021-05-13 NOTE — Telephone Encounter (Signed)
Appointment has been scheduled for 05/27/2021 at 10:45 am with Dr. Johnney Ou and mailed to patient.

## 2021-05-19 ENCOUNTER — Other Ambulatory Visit: Payer: Self-pay | Admitting: Internal Medicine

## 2021-05-19 DIAGNOSIS — I10 Essential (primary) hypertension: Secondary | ICD-10-CM

## 2021-05-25 ENCOUNTER — Other Ambulatory Visit: Payer: Self-pay

## 2021-05-25 ENCOUNTER — Telehealth: Payer: Self-pay | Admitting: Pharmacist

## 2021-05-25 ENCOUNTER — Ambulatory Visit: Payer: Medicare HMO | Admitting: Pharmacist

## 2021-05-25 NOTE — Telephone Encounter (Signed)
Attempted to call patient x3 for telephone AWV. Patient did not answer and unable to leave voicemail.

## 2021-05-27 ENCOUNTER — Encounter: Payer: Self-pay | Admitting: Student

## 2021-05-27 ENCOUNTER — Ambulatory Visit (INDEPENDENT_AMBULATORY_CARE_PROVIDER_SITE_OTHER): Payer: Medicare HMO | Admitting: Student

## 2021-05-27 ENCOUNTER — Other Ambulatory Visit: Payer: Self-pay

## 2021-05-27 VITALS — BP 146/76 | HR 83 | Temp 99.0°F | Ht 62.0 in | Wt 256.4 lb

## 2021-05-27 DIAGNOSIS — Z0000 Encounter for general adult medical examination without abnormal findings: Secondary | ICD-10-CM

## 2021-05-27 DIAGNOSIS — I152 Hypertension secondary to endocrine disorders: Secondary | ICD-10-CM | POA: Diagnosis not present

## 2021-05-27 DIAGNOSIS — R234 Changes in skin texture: Secondary | ICD-10-CM

## 2021-05-27 DIAGNOSIS — M25559 Pain in unspecified hip: Secondary | ICD-10-CM

## 2021-05-27 DIAGNOSIS — E1159 Type 2 diabetes mellitus with other circulatory complications: Secondary | ICD-10-CM

## 2021-05-27 DIAGNOSIS — R011 Cardiac murmur, unspecified: Secondary | ICD-10-CM

## 2021-05-27 DIAGNOSIS — Z6841 Body Mass Index (BMI) 40.0 and over, adult: Secondary | ICD-10-CM

## 2021-05-27 DIAGNOSIS — E119 Type 2 diabetes mellitus without complications: Secondary | ICD-10-CM | POA: Diagnosis not present

## 2021-05-27 DIAGNOSIS — H6123 Impacted cerumen, bilateral: Secondary | ICD-10-CM

## 2021-05-27 DIAGNOSIS — R6889 Other general symptoms and signs: Secondary | ICD-10-CM | POA: Diagnosis not present

## 2021-05-27 DIAGNOSIS — G8929 Other chronic pain: Secondary | ICD-10-CM

## 2021-05-27 LAB — GLUCOSE, CAPILLARY: Glucose-Capillary: 134 mg/dL — ABNORMAL HIGH (ref 70–99)

## 2021-05-27 LAB — POCT GLYCOSYLATED HEMOGLOBIN (HGB A1C): Hemoglobin A1C: 6.8 % — AB (ref 4.0–5.6)

## 2021-05-27 NOTE — Assessment & Plan Note (Signed)
Patient states she has difficulty getting in and out of shower, will order tub transfer bench.  Patient also states that she has difficulty getting up and down her two-story apartment.  I discussed with her that if she feels though she needs any assistance with this to reach out to our clinic and that I will be placing a consult to our chronic care management team to assist her with these matters.

## 2021-05-27 NOTE — Progress Notes (Signed)
CC: Hypertension, Diabetes Mellitus  HPI:  [name] is a 75 y.o. female with a past medical history stated below and presents today to discuss her hypertension, diabetes, and other chronic medical conditions. Please see problem based assessment and plan for additional details.  Past Medical History:  Diagnosis Date   Anxiety    Cardiomyopathy    Degenerative joint disease    Depression    Diabetes mellitus    NIDDM   Hypertension    Obesities, morbid (HCC)    TIA (transient ischemic attack)     Current Outpatient Medications on File Prior to Visit  Medication Sig Dispense Refill   olmesartan (BENICAR) 40 MG tablet TAKE 1 TABLET AT BEDTIME 90 tablet 3   Accu-Chek FastClix Lancets MISC Check blood sugar 1 time a day 102 each 4   acetaminophen (TYLENOL) 500 MG tablet Take 2 tablets (1,000 mg total) by mouth every 6 (six) hours as needed. 120 tablet 0   aspirin EC 81 MG tablet Take 1 tablet (81 mg total) by mouth every morning. 90 tablet 3   Blood Glucose Monitoring Suppl (ACCU-CHEK GUIDE) w/Device KIT 1 each by Does not apply route daily. Check blood sugar 1 time a day 1 kit 1   busPIRone (BUSPAR) 10 MG tablet Take 1 tablet (10 mg total) by mouth 3 (three) times daily. 90 tablet 0   glucose blood (ACCU-CHEK GUIDE) test strip Check blood sugar 1 time per day 100 each 4   hydrochlorothiazide (HYDRODIURIL) 25 MG tablet TAKE 1 TABLET EVERY DAY 90 tablet 1   metFORMIN (GLUCOPHAGE) 500 MG tablet TAKE 1 TABLET TWICE DAILY  WITH  MEALS 180 tablet 1   Multiple Vitamin (MULTIVITAMIN WITH MINERALS) TABS tablet Take 1 tablet by mouth daily. 90 tablet 3   naproxen (NAPROSYN) 500 MG tablet Take 1 tablet (500 mg total) by mouth 2 (two) times daily with a meal. 60 tablet 0   pravastatin (PRAVACHOL) 20 MG tablet TAKE 1 TABLET (20 MG TOTAL) BY MOUTH AT BEDTIME. 90 tablet 1   sertraline (ZOLOFT) 100 MG tablet TAKE 1 TABLET EVERY MORNING 90 tablet 1   TIADYLT ER 420 MG 24 hr capsule  TAKE 1 CAPSULE EVERY DAY 90 capsule 1   No current facility-administered medications on file prior to visit.    Family History  Problem Relation Age of Onset   Heart disease Mother    Cancer Father    Diabetes Sister    Kidney disease Brother    Stroke Brother     Social History   Socioeconomic History   Marital status: Divorced    Spouse name: Not on file   Number of children: Not on file   Years of education: Not on file   Highest education level: Not on file  Occupational History   Occupation: House keeping-retired since 2006    Employer: RETIRED  Tobacco Use   Smoking status: Former    Types: Cigarettes   Smokeless tobacco: Never   Tobacco comments:    Quit x 2 yrrs.  Substance and Sexual Activity   Alcohol use: Yes    Comment: vodka with water on occasions    Drug use: No    Comment: THC occasionally.   Sexual activity: Not on file  Other Topics Concern   Not on file  Social History Narrative   Lives in [place].   Social Determinants of Health   Financial Resource Strain: Not on file  Food Insecurity: Not on file  Transportation Needs: Not on file  Physical Activity: Not on file  Stress: Not on file  Social Connections: Not on file  Intimate Partner Violence: Not on file    Review of Systems: ROS negative except for what is noted on the assessment and plan.  Vitals:   05/27/21 1309 05/27/21 1321 05/27/21 1343  BP: (!) 148/79 136/69 (!) 146/76  Pulse: 86 83 83  Temp: 99 F (37.2 C)    TempSrc: Oral    SpO2: 99%    Weight: 256 lb 6.4 oz (116.3 kg)    Height: '5\' 2"'  (1.575 m)       Physical Exam: Constitutional: well appearing in no acute distress HENT: normocephalic atraumatic, mucous membranes moist. Bilateral cerumen impaction Eyes: conjunctiva non-erythematous Neck: supple Cardiovascular: regular rate and rhythm, 3/6 systolic murmur best heard over right 2nd intercostal space. Radiates to carotids Pulmonary/Chest: normal work of  breathing on room air MSK: normal bulk and tone Neurological: alert & oriented x 3 Skin: warm and dry. Peeling of distal portion of bilateral digits.  Psych: normal mood and thought process  Assessment & Plan:   See Encounters Tab for problem based charting.  Patient discussed with Dr. Caffie Damme, D.O. [facility], PGY-2 Pager: [phone], Phone: [phone] Date 05/27/2021 Time 5:04 PM

## 2021-05-27 NOTE — Assessment & Plan Note (Signed)
Assessment: BP of 146/76. Current BP regimen of olmesartan 40 mg daily, HCTZ 25 mg daily, dilt 420 mg capsule daily. She endorses taking her medications daily and does not check her blood pressures at home.  Discussed the importance of checking at least once or twice a week to assure that her blood pressure is not dropping too low.  She denies any symptoms of lightheadedness or dizziness.  We will continue on current regimen.  Plan: -Continue olmesartan, HCTZ, dilt daily as per above

## 2021-05-27 NOTE — Assessment & Plan Note (Signed)
Assessment: Patient unfortunately did not follow up with echocardiogram ordered during last encounter. On repeat exam today, still has 3/6 systolic murmur that does radiate to her carotids. Discussed with her importance of this exam and patient acknowledges understanding of this and will follow up with echocardiogram  Plan: -echocardiogram pending

## 2021-05-27 NOTE — Assessment & Plan Note (Addendum)
Assessment: Patient states for the past few years she has had episodes of painless skin peeling of her bilateral distal digits and hands. She notes it only occurs in the summer and then goes away. Denies any other symptoms. She denies changes in any substance (soaps, perfumes). She denies a history of smoking. She denies any change in color of the finger tips.   Overall low suspician for vasculitis or other inflammatory etiology. If becomes more persistent can work up further but at this time will continue to monitor.   Plan: - continue to monitor

## 2021-05-27 NOTE — Patient Instructions (Signed)
Thank you, Ms.[name] for allowing [place] to provide your care today. Today we discussed   High Blood Pressure Please continue to take your blood pressure medications and check your pressure once a week if possible.   Diabetes You are doing well overall, please continue to take your metformin daily  Ear Popping We have used water to remove the wax from your years. You may feel a bit dizzy after this so be careful with your balance and walking. Please continue to use your cane/walker.  Hand Peeling Please continue to monitor, if you notice it more frequently please call our clinic.  Murmur Please call and have your echocardiogram scheduled  I have ordered the following labs for you:   Lab Orders  Glucose, capillary  POC Hbg A1C     Referrals ordered today:   Referral Orders  No referral(s) requested today     I have ordered the following medication/changed the following medications:   Stop the following medications: There are no discontinued medications.   Start the following medications: No orders of the defined types were placed in this encounter.    Follow up:  Please return in November to see me and discuss echocardiogram results     Should you have any questions or concerns please call the internal medicine clinic at [phone].     Sanjuana Letters, D.O. [facility]

## 2021-05-27 NOTE — Assessment & Plan Note (Signed)
Assessment: Patient endorses bilateral ear popping and difficulty hearing the past few months. On exam, patient with bilateral cerumen impaction. This was resolved with warm water flushes by the nursing staff. Patient was initially dizzy after the procedure, but noted significant improvement in her hearing. After a few minutes of sitting, the dizziness resolved. Repeat ear exam, no longer having impaction.   Instructed patient to purchase ove the counter debrox drops if this occurred again to help loosen up the cerumen. Also discouraged her from using q tips as they push cerumen further into the canal.   Plan: -post water flushes, hearing improved -debrox as needed in the future

## 2021-05-27 NOTE — Assessment & Plan Note (Signed)
Assessment: A1c of 6.8% from 5.9%.  During her last appointment, patient's glipizide was discontinued as it was thought that she was having hypoglycemic events.  Our goal for her is an A1c below 7% and we are currently meeting that with metformin only.  We will continue on current regimen.  Can repeat A1c every 6 months to a year.  Plan: -Continue metformin 500 mg twice daily

## 2021-05-28 ENCOUNTER — Telehealth: Payer: Self-pay | Admitting: *Deleted

## 2021-06-01 ENCOUNTER — Telehealth: Payer: Self-pay | Admitting: *Deleted

## 2021-06-01 NOTE — Telephone Encounter (Signed)
Carola Rhine, RN; Eugenia Pancoast; Miquel Dunn; 1 other  Got it! Thank you!

## 2021-06-01 NOTE — Telephone Encounter (Signed)
CM sent to Stephannie Peters at [facility] for Tub transfer bench. F2F was 05/27/21.

## 2021-06-01 NOTE — Telephone Encounter (Signed)
   Telephone encounter was:  Unsuccessful.  06/01/2021 Name: Allison Caldwell MRN: [id number] DOB: 30-Jul-1946  Unsuccessful outbound call made today to assist with:  Transportation Needs  and Food Insecurity  Outreach Attempt:  2nd Attempt  A HIPAA compliant voice message was left requesting a return call.  Instructed patient to call back at  Instructed patient to call back at [phone]  at their earliest convenience.   [place], Care Management  [phone] [address] , [place] [address] Email : [email or web address] [email or web address]

## 2021-06-13 NOTE — Progress Notes (Signed)
Internal Medicine Clinic Attending  Case discussed with Dr. Katsadouros  At the time of the visit.  We reviewed the resident's history and exam and pertinent patient test results.  I agree with the assessment, diagnosis, and plan of care documented in the resident's note.  

## 2021-06-16 ENCOUNTER — Telehealth: Payer: Self-pay | Admitting: Student

## 2021-06-16 ENCOUNTER — Telehealth: Payer: Self-pay | Admitting: *Deleted

## 2021-06-16 ENCOUNTER — Other Ambulatory Visit: Payer: Self-pay | Admitting: Student

## 2021-06-16 DIAGNOSIS — R011 Cardiac murmur, unspecified: Secondary | ICD-10-CM

## 2021-06-16 NOTE — Telephone Encounter (Signed)
-----   Message from Riesa Pope, MD sent at 06/16/2021  9:28 AM EDT ----- Regarding: Shortness of breath appointment Good Morning,   Can we please schedule Ms. Ratterman to be seen in our clinic for these episodes of shortness of breath with exertion?   Thanks!  Vasili Katsadouros

## 2021-06-16 NOTE — Telephone Encounter (Signed)
Please refer to message below.  Spoke with patient this morning and she agreed to an appointment for next Wednesday 06/23/2021 at 1:45 pm.

## 2021-06-16 NOTE — Telephone Encounter (Signed)
   Telephone encounter was:  Successful.  06/16/2021 Name: Allison Caldwell MRN: [id number] DOB: 10/03/1946  Allison Caldwell is a 75 y.o. year old female who is a primary care patient of Katsadouros, Candace Gallus, MD . The community resource team was consulted for assistance with patient has not received tub bench , needs rollator and contacted dr to find out where she needs to go for an echo  Care guide performed the following interventions: Patient provided with information about care guide support team and interviewed to confirm resource needs Follow up call placed to community resources to determine status of patients referral.  Follow Up Plan: relayed drs message that office would be schedul;ing an appt with her to recheck   [place], Care Management  [phone] [address] , [place] [address] Email : Ashby [email or web address]-moran [email or web address]

## 2021-06-23 ENCOUNTER — Encounter: Payer: Self-pay | Admitting: Student

## 2021-06-23 ENCOUNTER — Ambulatory Visit (INDEPENDENT_AMBULATORY_CARE_PROVIDER_SITE_OTHER): Payer: Medicare HMO | Admitting: Student

## 2021-06-23 ENCOUNTER — Other Ambulatory Visit: Payer: Self-pay

## 2021-06-23 VITALS — BP 159/90 | HR 82 | Temp 98.5°F | Resp 20 | Ht 62.0 in | Wt 263.1 lb

## 2021-06-23 DIAGNOSIS — M1611 Unilateral primary osteoarthritis, right hip: Secondary | ICD-10-CM

## 2021-06-23 DIAGNOSIS — R6889 Other general symptoms and signs: Secondary | ICD-10-CM | POA: Diagnosis not present

## 2021-06-23 DIAGNOSIS — F341 Dysthymic disorder: Secondary | ICD-10-CM

## 2021-06-23 NOTE — Patient Instructions (Signed)
Mrs. Hoffner for your hip pain please use diclofenac gel to help with the pain. You can also try short courses of ibuprofen.    Please call use if you have any questions or concerns.

## 2021-06-25 NOTE — Progress Notes (Signed)
Allison Caldwell was initially scheduled for an appointment regarding shortness of breath. She states that there may have been miscommunication, but she notes she does not have any shortness of breath but rather she has to take breaks with walking due to R hip pain. Patient had ECHO ordered to evaluate a murmur, they were unfortunately unable to perform that today. Patient will follow up with her PCP at a later date once her ECHO is obtained. Gave patient instructions on topical creams/gels for her hip, for what is likely OA. She was agreeable to this plan and will not be charged for this visit.

## 2021-06-29 NOTE — Progress Notes (Signed)
Internal Medicine Clinic Attending  Case discussed with Dr. Carter  At the time of the visit.  We reviewed the resident's history and exam and pertinent patient test results.  I agree with the assessment, diagnosis, and plan of care documented in the resident's note.  

## 2021-07-07 ENCOUNTER — Telehealth: Payer: Self-pay

## 2021-07-07 NOTE — Telephone Encounter (Signed)
Patient had AWV on 05/23/2021.

## 2021-07-08 ENCOUNTER — Other Ambulatory Visit: Payer: Self-pay | Admitting: Internal Medicine

## 2021-07-08 DIAGNOSIS — E119 Type 2 diabetes mellitus without complications: Secondary | ICD-10-CM

## 2021-07-12 ENCOUNTER — Encounter: Payer: Self-pay | Admitting: Student

## 2021-07-27 ENCOUNTER — Ambulatory Visit: Payer: Medicare HMO | Admitting: Behavioral Health

## 2021-07-27 DIAGNOSIS — F419 Anxiety disorder, unspecified: Secondary | ICD-10-CM

## 2021-07-27 DIAGNOSIS — F331 Major depressive disorder, recurrent, moderate: Secondary | ICD-10-CM

## 2021-07-27 NOTE — BH Specialist Note (Signed)
Integrated Behavioral Health via Telemedicine Visit  07/27/2021 Allison Caldwell [id number]  Number of [place] visits: 1/6 Session Start time: 9:30am  Session End time: 10:00am Total time: 30  Referring Provider: Dr. Carlisle Beers, MD Patient/Family location: Pt is home in private [facility] Provider location: [facility] Office All persons participating in visit: Pt & Clinician Types of Service: Individual psychotherapy  I connected with Allison Caldwell and/or Allison Caldwell's  self  via  Telephone or Video Enabled Telemedicine Application  (Video is Caregility application) and verified that I am speaking with the correct person using two identifiers. Discussed confidentiality: Yes   I discussed the limitations of telemedicine and the availability of in person appointments.  Discussed there is a possibility of technology failure and discussed alternative modes of communication if that failure occurs.  I discussed that engaging in this telemedicine visit, they consent to the provision of behavioral healthcare and the services will be billed under their insurance.  Patient and/or legal guardian expressed understanding and consented to Telemedicine visit: Yes   Presenting Concerns: Patient and/or family reports the following symptoms/concerns: elevated anx/dep due to both Sons Hx of incarceration. One Son is out of prison & the other will be incarcerated until 2024. Duration of problem: for years; Severity of problem: moderate  Patient and/or Family's Strengths/Protective Factors: Social connections, Social and Emotional competence, and Sense of purpose  Goals Addressed: Patient will:  Reduce symptoms of: anxiety, depression, and stress   Increase knowledge and/or ability of: coping skills, healthy habits, and stress reduction   Demonstrate ability to: Increase healthy adjustment to current life circumstances and Begin healthy grieving over loss  Progress  towards Goals: Estb'd today; Pt requests ck-in visits for mental health wellness  Interventions: Interventions utilized:  Solution-Focused Strategies and Supportive Counseling Standardized Assessments completed:  screeners prn  Patient and/or Family Response: Pt receptive to call today & requests future appt  Assessment: Patient currently experiencing elevated anx/dep due to recent health issues she needs to have evaluated. Pt surmises she has cardiac issues due to 20+ yrs of both Sons being incarcerated. Pt was a Single Parent & had Sons later in life (58yo &  22yo births). One Son is out in [place], working [employer] w/GF. He is doing well. She speaks to him daily & still worries about him. Other Son is incarcerated until 2024. He is not served well by the prison system. Pt wants to speak w/his Case Worker & does not know what else to do for him.   Patient may benefit from cont'd support to process her feelings & emotional connection w/both her Sons & their situations.  Plan: Follow up with behavioral health clinician on : 2-3 wks on telehealth in the am for 30 min. Behavioral recommendations: Reach out for any help you need to Family members. They have shown they care & want to be there for you. Referral(s): [place] (In Clinic)  I discussed the assessment and treatment plan with the patient and/or parent/guardian. They were provided an opportunity to ask questions and all were answered. They agreed with the plan and demonstrated an understanding of the instructions.   They were advised to call back or seek an in-person evaluation if the symptoms worsen or if the condition fails to improve as anticipated.  Allison Hutching, LMFT

## 2021-09-13 ENCOUNTER — Ambulatory Visit (HOSPITAL_COMMUNITY): Admission: RE | Admit: 2021-09-13 | Payer: Medicare HMO | Source: Ambulatory Visit

## 2021-09-29 ENCOUNTER — Encounter: Payer: Medicare HMO | Admitting: Student

## 2021-12-03 ENCOUNTER — Other Ambulatory Visit: Payer: Self-pay | Admitting: Internal Medicine

## 2021-12-03 ENCOUNTER — Other Ambulatory Visit: Payer: Self-pay | Admitting: Student

## 2021-12-03 DIAGNOSIS — F341 Dysthymic disorder: Secondary | ICD-10-CM

## 2022-01-06 ENCOUNTER — Other Ambulatory Visit: Payer: Self-pay | Admitting: Student

## 2022-01-06 DIAGNOSIS — I10 Essential (primary) hypertension: Secondary | ICD-10-CM

## 2022-01-06 DIAGNOSIS — I152 Hypertension secondary to endocrine disorders: Secondary | ICD-10-CM

## 2022-01-17 ENCOUNTER — Encounter: Payer: Medicare HMO | Admitting: Student

## 2022-01-17 ENCOUNTER — Encounter: Payer: Self-pay | Admitting: Student

## 2022-03-08 ENCOUNTER — Other Ambulatory Visit: Payer: Self-pay | Admitting: Student

## 2022-03-08 DIAGNOSIS — E119 Type 2 diabetes mellitus without complications: Secondary | ICD-10-CM

## 2022-05-06 ENCOUNTER — Emergency Department (HOSPITAL_COMMUNITY): Payer: Medicare HMO

## 2022-05-06 ENCOUNTER — Observation Stay (HOSPITAL_COMMUNITY)
Admission: EM | Admit: 2022-05-06 | Discharge: 2022-05-08 | Disposition: A | Discharge status: Home Health | Payer: Medicare HMO | Attending: Internal Medicine | Admitting: Internal Medicine

## 2022-05-06 ENCOUNTER — Encounter (HOSPITAL_COMMUNITY): Payer: Self-pay

## 2022-05-06 ENCOUNTER — Other Ambulatory Visit: Payer: Self-pay

## 2022-05-06 DIAGNOSIS — E785 Hyperlipidemia, unspecified: Secondary | ICD-10-CM | POA: Insufficient documentation

## 2022-05-06 DIAGNOSIS — I10 Essential (primary) hypertension: Secondary | ICD-10-CM | POA: Insufficient documentation

## 2022-05-06 DIAGNOSIS — Z5911 Inadequate housing environmental temperature: Secondary | ICD-10-CM | POA: Insufficient documentation

## 2022-05-06 DIAGNOSIS — R011 Cardiac murmur, unspecified: Secondary | ICD-10-CM | POA: Diagnosis not present

## 2022-05-06 DIAGNOSIS — R2689 Other abnormalities of gait and mobility: Secondary | ICD-10-CM | POA: Diagnosis not present

## 2022-05-06 DIAGNOSIS — Z87891 Personal history of nicotine dependence: Secondary | ICD-10-CM | POA: Diagnosis not present

## 2022-05-06 DIAGNOSIS — R1110 Vomiting, unspecified: Secondary | ICD-10-CM | POA: Insufficient documentation

## 2022-05-06 DIAGNOSIS — E1165 Type 2 diabetes mellitus with hyperglycemia: Secondary | ICD-10-CM | POA: Diagnosis not present

## 2022-05-06 DIAGNOSIS — Z7984 Long term (current) use of oral hypoglycemic drugs: Secondary | ICD-10-CM | POA: Diagnosis not present

## 2022-05-06 DIAGNOSIS — H109 Unspecified conjunctivitis: Secondary | ICD-10-CM | POA: Insufficient documentation

## 2022-05-06 DIAGNOSIS — R109 Unspecified abdominal pain: Secondary | ICD-10-CM | POA: Diagnosis not present

## 2022-05-06 DIAGNOSIS — R1111 Vomiting without nausea: Secondary | ICD-10-CM | POA: Diagnosis not present

## 2022-05-06 DIAGNOSIS — E876 Hypokalemia: Secondary | ICD-10-CM | POA: Diagnosis not present

## 2022-05-06 DIAGNOSIS — E860 Dehydration: Secondary | ICD-10-CM | POA: Diagnosis not present

## 2022-05-06 DIAGNOSIS — Z7982 Long term (current) use of aspirin: Secondary | ICD-10-CM | POA: Insufficient documentation

## 2022-05-06 DIAGNOSIS — E871 Hypo-osmolality and hyponatremia: Secondary | ICD-10-CM | POA: Diagnosis not present

## 2022-05-06 DIAGNOSIS — E119 Type 2 diabetes mellitus without complications: Secondary | ICD-10-CM | POA: Diagnosis not present

## 2022-05-06 DIAGNOSIS — T675XXA Heat exhaustion, unspecified, initial encounter: Principal | ICD-10-CM | POA: Insufficient documentation

## 2022-05-06 DIAGNOSIS — R509 Fever, unspecified: Secondary | ICD-10-CM | POA: Insufficient documentation

## 2022-05-06 DIAGNOSIS — Z8673 Personal history of transient ischemic attack (TIA), and cerebral infarction without residual deficits: Secondary | ICD-10-CM | POA: Diagnosis not present

## 2022-05-06 DIAGNOSIS — R531 Weakness: Secondary | ICD-10-CM | POA: Diagnosis present

## 2022-05-06 DIAGNOSIS — Z79899 Other long term (current) drug therapy: Secondary | ICD-10-CM | POA: Diagnosis not present

## 2022-05-06 DIAGNOSIS — N281 Cyst of kidney, acquired: Secondary | ICD-10-CM | POA: Diagnosis not present

## 2022-05-06 DIAGNOSIS — R000 Tachycardia, unspecified: Secondary | ICD-10-CM | POA: Diagnosis not present

## 2022-05-06 LAB — URINALYSIS, ROUTINE W REFLEX MICROSCOPIC
Bilirubin Urine: NEGATIVE
Glucose, UA: NEGATIVE mg/dL
Hgb urine dipstick: NEGATIVE
Ketones, ur: NEGATIVE mg/dL
Leukocytes,Ua: NEGATIVE
Nitrite: NEGATIVE
Protein, ur: 100 mg/dL — AB
Specific Gravity, Urine: 1.02 (ref 1.005–1.030)
pH: 7 (ref 5.0–8.0)

## 2022-05-06 LAB — URINALYSIS, MICROSCOPIC (REFLEX): RBC / HPF: NONE SEEN RBC/hpf (ref 0–5)

## 2022-05-06 LAB — CBC WITH DIFFERENTIAL/PLATELET
Abs Immature Granulocytes: 0.04 10*3/uL (ref 0.00–0.07)
Basophils Absolute: 0 10*3/uL (ref 0.0–0.1)
Basophils Relative: 0 %
Eosinophils Absolute: 0 10*3/uL (ref 0.0–0.5)
Eosinophils Relative: 0 %
HCT: 39.9 % (ref 36.0–46.0)
Hemoglobin: 13.5 g/dL (ref 12.0–15.0)
Immature Granulocytes: 1 %
Lymphocytes Relative: 21 %
Lymphs Abs: 1.4 10*3/uL (ref 0.7–4.0)
MCH: 30.4 pg (ref 26.0–34.0)
MCHC: 33.8 g/dL (ref 30.0–36.0)
MCV: 89.9 fL (ref 80.0–100.0)
Monocytes Absolute: 0.2 10*3/uL (ref 0.1–1.0)
Monocytes Relative: 3 %
Neutro Abs: 5.3 10*3/uL (ref 1.7–7.7)
Neutrophils Relative %: 75 %
Platelets: 249 10*3/uL (ref 150–400)
RBC: 4.44 MIL/uL (ref 3.87–5.11)
RDW: 14.3 % (ref 11.5–15.5)
WBC: 7 10*3/uL (ref 4.0–10.5)
nRBC: 0 % (ref 0.0–0.2)

## 2022-05-06 LAB — MAGNESIUM: Magnesium: 0.9 mg/dL — CL (ref 1.7–2.4)

## 2022-05-06 LAB — COMPREHENSIVE METABOLIC PANEL
ALT: 15 U/L (ref 0–44)
AST: 22 U/L (ref 15–41)
Albumin: 3.7 g/dL (ref 3.5–5.0)
Alkaline Phosphatase: 38 U/L (ref 38–126)
Anion gap: 15 (ref 5–15)
BUN: 6 mg/dL — ABNORMAL LOW (ref 8–23)
CO2: 24 mmol/L (ref 22–32)
Calcium: 8.4 mg/dL — ABNORMAL LOW (ref 8.9–10.3)
Chloride: 90 mmol/L — ABNORMAL LOW (ref 98–111)
Creatinine, Ser: 0.73 mg/dL (ref 0.44–1.00)
GFR, Estimated: >60 mL/min (ref >60)
Glucose, Bld: 219 mg/dL — ABNORMAL HIGH (ref 70–99)
Potassium: 2.7 mmol/L — CL (ref 3.5–5.1)
Sodium: 129 mmol/L — ABNORMAL LOW (ref 135–145)
Total Bilirubin: 1 mg/dL (ref 0.3–1.2)
Total Protein: 7.1 g/dL (ref 6.5–8.1)

## 2022-05-06 LAB — LIPASE, BLOOD: Lipase: 29 U/L (ref 11–51)

## 2022-05-06 LAB — LACTIC ACID, PLASMA: Lactic Acid, Venous: 1.8 mmol/L (ref 0.5–1.9)

## 2022-05-06 MED ORDER — POTASSIUM CHLORIDE 10 MEQ/100ML IV SOLN
10.0000 meq | Freq: Once | INTRAVENOUS | Status: AC
  Administered 2022-05-06: 10 meq via INTRAVENOUS
  Filled 2022-05-06: qty 100

## 2022-05-06 MED ORDER — SODIUM CHLORIDE 0.9 % IV BOLUS
1000.0000 mL | Freq: Once | INTRAVENOUS | Status: AC
  Administered 2022-05-06: 1000 mL via INTRAVENOUS

## 2022-05-06 MED ORDER — IOHEXOL 300 MG/ML  SOLN
100.0000 mL | Freq: Once | INTRAMUSCULAR | Status: AC | PRN
  Administered 2022-05-06: 100 mL via INTRAVENOUS

## 2022-05-06 MED ORDER — ACETAMINOPHEN 650 MG RE SUPP
650.0000 mg | Freq: Once | RECTAL | Status: AC
  Administered 2022-05-06: 650 mg via RECTAL
  Filled 2022-05-06: qty 1

## 2022-05-06 MED ORDER — POTASSIUM CHLORIDE CRYS ER 20 MEQ PO TBCR
40.0000 meq | EXTENDED_RELEASE_TABLET | Freq: Once | ORAL | Status: AC
  Administered 2022-05-07: 40 meq via ORAL
  Filled 2022-05-06: qty 2

## 2022-05-06 MED ORDER — ONDANSETRON HCL 4 MG/2ML IJ SOLN
4.0000 mg | Freq: Once | INTRAMUSCULAR | Status: AC
  Administered 2022-05-06: 4 mg via INTRAVENOUS
  Filled 2022-05-06: qty 2

## 2022-05-06 MED ORDER — MAGNESIUM SULFATE 2 GM/50ML IV SOLN
2.0000 g | Freq: Once | INTRAVENOUS | Status: AC
  Administered 2022-05-07: 2 g via INTRAVENOUS
  Filled 2022-05-06: qty 50

## 2022-05-06 NOTE — ED Provider Notes (Signed)
[facility] EMERGENCY DEPARTMENT Provider Note   CSN: [id number] Arrival date & time: 05/06/22  1956     History  Chief Complaint  Patient presents with   Heat Exposure   Vomiting    Allison Caldwell is a 76 y.o. female.  Pt is a 76 yo female with a pmhx significant for htn, dm, obesity, tia, depression, CM, and anxiety.  Pt lives alone.  She said she has not had power for 2 weeks.  She started vomiting today. EMS was called to her house and said the house was extremely hot.  Pt has had some abd pain.         Home Medications Prior to Admission medications   Medication Sig Start Date End Date Taking? Authorizing Provider  olmesartan (BENICAR) 40 MG tablet TAKE 1 TABLET AT BEDTIME 05/20/21   Riesa Pope, MD  Accu-Chek FastClix Lancets MISC Check blood sugar 1 time a day 09/28/20   Riesa Pope, MD  acetaminophen (TYLENOL) 500 MG tablet Take 2 tablets (1,000 mg total) by mouth every 6 (six) hours as needed. 12/06/17   Ledell Noss, MD  aspirin EC 81 MG tablet Take 1 tablet (81 mg total) by mouth every morning. 05/05/16   Funches, Adriana Mccallum, MD  Blood Glucose Monitoring Suppl (ACCU-CHEK GUIDE) w/Device KIT 1 each by Does not apply route daily. Check blood sugar 1 time a day 01/31/19   Ina Homes, MD  busPIRone (BUSPAR) 10 MG tablet Take 1 tablet (10 mg total) by mouth 3 (three) times daily. 08/08/19   Ina Homes, MD  glucose blood (ACCU-CHEK GUIDE) test strip Check blood sugar 1 time per day 09/28/20   Riesa Pope, MD  hydrochlorothiazide (HYDRODIURIL) 25 MG tablet TAKE 1 TABLET EVERY DAY 01/06/22   Katsadouros, Vasilios, MD  metFORMIN (GLUCOPHAGE) 500 MG tablet TAKE 1 TABLET TWICE DAILY  WITH  MEALS 12/06/21   Katsadouros, Vasilios, MD  Multiple Vitamin (MULTIVITAMIN WITH MINERALS) TABS tablet Take 1 tablet by mouth daily. 05/05/16   Funches, Adriana Mccallum, MD  naproxen (NAPROSYN) 500 MG tablet Take 1 tablet (500 mg total) by mouth 2 (two) times  daily with a meal. 10/16/16   Funches, Josalyn, MD  pravastatin (PRAVACHOL) 20 MG tablet TAKE 1 TABLET AT BEDTIME 03/09/22   Katsadouros, Vasilios, MD  sertraline (ZOLOFT) 100 MG tablet TAKE 1 TABLET EVERY MORNING 12/06/21   Riesa Pope, MD  TIADYLT ER 420 MG 24 hr capsule TAKE 1 CAPSULE EVERY DAY 01/06/22   Riesa Pope, MD      Allergies    Ace inhibitors and Penicillins    Review of Systems   Review of Systems  Gastrointestinal:  Positive for abdominal pain, nausea and vomiting.  All other systems reviewed and are negative.   Physical Exam Updated Vital Signs BP 117/69   Pulse 76   Temp (!) 102.3 F (39.1 C) (Oral)   Resp (!) 25   Ht $R'5\' 2"'oj$  (1.575 m)   Wt 122.5 kg   SpO2 93%   BMI 49.38 kg/m  Physical Exam Vitals and nursing note reviewed.  Constitutional:      Appearance: Normal appearance. She is obese.  HENT:     Head: Normocephalic and atraumatic.     Right Ear: External ear normal.     Left Ear: External ear normal.     Nose: Nose normal.     Mouth/Throat:     Mouth: Mucous membranes are dry.  Eyes:     Extraocular Movements: Extraocular movements  intact.     Conjunctiva/sclera: Conjunctivae normal.     Pupils: Pupils are equal, round, and reactive to light.  Cardiovascular:     Rate and Rhythm: Normal rate and regular rhythm.     Pulses: Normal pulses.     Heart sounds: Normal heart sounds.  Pulmonary:     Effort: Pulmonary effort is normal.     Breath sounds: Normal breath sounds.  Abdominal:     General: Abdomen is flat. Bowel sounds are normal.     Palpations: Abdomen is soft.     Tenderness: There is generalized abdominal tenderness.  Musculoskeletal:        General: Normal range of motion.     Cervical back: Normal range of motion and neck supple.  Skin:    General: Skin is warm.     Capillary Refill: Capillary refill takes less than 2 seconds.  Neurological:     General: No focal deficit present.     Mental Status: She is alert  and oriented to person, place, and time.  Psychiatric:        Mood and Affect: Mood normal.        Behavior: Behavior normal.     ED Results / Procedures / Treatments   Labs (all labs ordered are listed, but only abnormal results are displayed) Labs Reviewed  COMPREHENSIVE METABOLIC PANEL - Abnormal; Notable for the following components:      Result Value   Sodium 129 (*)    Potassium 2.7 (*)    Chloride 90 (*)    Glucose, Bld 219 (*)    BUN 6 (*)    Calcium 8.4 (*)    All other components within normal limits  URINALYSIS, ROUTINE W REFLEX MICROSCOPIC - Abnormal; Notable for the following components:   APPearance HAZY (*)    Protein, ur 100 (*)    All other components within normal limits  URINALYSIS, MICROSCOPIC (REFLEX) - Abnormal; Notable for the following components:   Bacteria, UA MANY (*)    All other components within normal limits  MAGNESIUM - Abnormal; Notable for the following components:   Magnesium 0.9 (*)    All other components within normal limits  URINE CULTURE  CULTURE, BLOOD (ROUTINE X 2)  CULTURE, BLOOD (ROUTINE X 2)  CBC WITH DIFFERENTIAL/PLATELET  LIPASE, BLOOD  LACTIC ACID, PLASMA  LACTIC ACID, PLASMA    EKG EKG Interpretation  Date/Time:  Friday May 06 2022 20:05:27 EDT Ventricular Rate:  92 PR Interval:  116 QRS Duration: 107 QT Interval:  330 QTC Calculation: 409 R Axis:   -84 Text Interpretation: Sinus tachycardia Multiple premature complexes, vent & supraven Borderline short PR interval Left anterior fascicular block Probable anteroseptal infarct, old ST elevation, consider inferior injury No significant change since last tracing Confirmed by Isla Pence [phone]) on 05/06/2022 9:15:29 PM  Radiology DG Chest Portable 1 View  Result Date: 05/06/2022 CLINICAL DATA:  Fever EXAM: PORTABLE CHEST 1 VIEW COMPARISON:  01/30/2016 FINDINGS: Marked cardiomegaly. No focal airspace disease or effusion. No visible pneumothorax. IMPRESSION: No active  disease.  Cardiomegaly. Electronically Signed   By: Donavan Foil M.D.   On: 05/06/2022 23:01   CT ABDOMEN PELVIS W CONTRAST  Result Date: 05/06/2022 CLINICAL DATA:  Nausea and vomiting, abdominal pain, heat exposure EXAM: CT ABDOMEN AND PELVIS WITH CONTRAST TECHNIQUE: Multidetector CT imaging of the abdomen and pelvis was performed using the standard protocol following bolus administration of intravenous contrast. RADIATION DOSE REDUCTION: This exam was performed according to  the departmental dose-optimization program which includes automated exposure control, adjustment of the mA and/or kV according to patient size and/or use of iterative reconstruction technique. CONTRAST:  194m OMNIPAQUE IOHEXOL 300 MG/ML  SOLN COMPARISON:  11/20/2011 FINDINGS: Lower chest: Heart is enlarged without pericardial effusion. No acute pleural or parenchymal lung disease. Hepatobiliary: No focal liver abnormality is seen. No gallstones, gallbladder wall thickening, or biliary dilatation. Pancreas: Unremarkable. No pancreatic ductal dilatation or surrounding inflammatory changes. Spleen: Normal in size without focal abnormality. Adrenals/Urinary Tract: 1.5 cm simple appearing cyst lower pole right kidney does not require specific follow-up. Otherwise the kidneys enhance normally and symmetrically. No urinary tract calculi or obstructive uropathy. The adrenals are unremarkable. Small amount of gas within the bladder lumen could be due to recent catheterization. Stomach/Bowel: No bowel obstruction or ileus. Normal appendix right lower quadrant. No bowel wall thickening or inflammatory change. Vascular/Lymphatic: Aortic atherosclerosis. No enlarged abdominal or pelvic lymph nodes. Reproductive: Uterus is unremarkable. Simple appearing bilateral adnexal cysts, measuring 1.9 cm on the right and 3.1 cm on the left. Other: No free fluid or free intraperitoneal gas. No abdominal wall hernia. Musculoskeletal: No acute or destructive bony  lesions. Chronic sclerotic foci within the bony pelvis and sacrum consistent with bone islands. 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 IMPRESSION: 1. No acute intra-abdominal or intrapelvic process. 2. Multiple ovarian cysts. Most severe: 3.1 cm left adnexal simple-appearing cyst. Recommend follow-up pelvic UK[place]ain 6-12 months. Reference: JACR 2020 Feb;17(2):248-254 3.  Aortic Atherosclerosis (ICD10-I70.0). Electronically Signed   By: MRanda NgoM.D.   On: 05/06/2022 22:09    Procedures Procedures    Medications Ordered in ED Medications  potassium chloride 10 mEq in 100 mL IVPB (10 mEq Intravenous New Bag/Given 05/06/22 2212)  magnesium sulfate IVPB 2 g 50 mL (has no administration in time range)  potassium chloride SA (KLOR-CON M) CR tablet 40 mEq (has no administration in time range)  sodium chloride 0.9 % bolus 1,000 mL (0 mLs Intravenous Stopped 05/06/22 2209)  ondansetron (ZOFRAN) injection 4 mg (4 mg Intravenous Given 05/06/22 2035)  acetaminophen (TYLENOL) suppository 650 mg (650 mg Rectal Given 05/06/22 2035)  iohexol (OMNIPAQUE) 300 MG/ML solution 100 mL (100 mLs Intravenous Contrast Given 05/06/22 2154)    ED Course/ Medical Decision Making/ A&P                           Medical Decision Making Amount and/or Complexity of Data Reviewed Labs: ordered. Radiology: ordered.  Risk OTC drugs. Prescription drug management. Decision regarding hospitalization.   This patient presents to the ED for concern of n/v, this involves an extensive number of treatment options, and is a complaint that carries with it a high risk of complications and morbidity.  The differential diagnosis includes heat exhaustion, infection, electrolyte abn   Co morbidities that complicate the patient evaluation   htn, dm, obesity, tia, depression, CM, and anxiety   Additional history obtained:  Additional history obtained from epic chart review External  records from outside source obtained and reviewed including EMS report   Lab Tests:  I Ordered, and personally interpreted labs.  The pertinent results include:  cbc is nl, cmp with k low at 2.7, glucose low at 219; ua neg, lactic neg, mg low at 0.9   Imaging Studies ordered:  I ordered imaging studies including ct abd/pelvis and cxr  I independently visualized and interpreted imaging which showed  CT abd/pelvis: IMPRESSION:  1. No acute intra-abdominal or intrapelvic process.  2. Multiple ovarian cysts. Most severe: 3.1 cm left adnexal  simple-appearing cyst. Recommend follow-up pelvic US in 6-12 months.  Reference: JACR 2020 Feb;17(2):248-254  3.  Aortic Atherosclerosis (ICD10-I70.0).  CXR: IMPRESSION:  No active disease.  Cardiomegaly.   I agree with the radiologist interpretation   Cardiac Monitoring:  The patient was maintained on a cardiac monitor.  I personally viewed and interpreted the cardiac monitored which showed an underlying rhythm of: nsr   Medicines ordered and prescription drug management:  I ordered medication including kcl  for hypokalemia, magnesium for hypomag; ivfs for dehydration, tylenol for fever, zofran for nausea Reevaluation of the patient after these medicines showed that the patient improved I have reviewed the patients home medicines and have made adjustments as needed   Test Considered:  ct   Critical Interventions:  ivfs   Consultations Obtained:  I requested consultation with the imts,  and discussed lab and imaging findings as well as pertinent plan - they will admit   Problem List / ED Course:  Hypokalemia:  iv and oral K given to pt Hypomag:  iv mg given to pt Heat exhaustion:  pt has no power to her home and nowhere to go.  SW will need to get involved. N/v:  improved after fluids and zofran Fever:  I suspect elevated temp was from the ambient temp in her house being extremely elevated.  Pt will need to see sw while here  to see if she can get some help getting her power turned back on.   Reevaluation:  After the interventions noted above, I reevaluated the patient and found that they have :improved   Social Determinants of Health:  No power at home   Dispostion:  After consideration of the diagnostic results and the patients response to treatment, I feel that the patent would benefit from admission.          Final Clinical Impression(s) / ED Diagnoses Final diagnoses:  Hypokalemia  Hypomagnesemia  Dehydration  Heat exhaustion, initial encounter    Rx / DC Orders ED Discharge Orders     None         Isla Pence, MD 05/06/22 2312

## 2022-05-06 NOTE — H&P (Incomplete)
Date: 05/07/2022               Patient Name:  Allison Caldwell MRN: [id number]  DOB: 1946-10-06 Age / Sex: 76 y.o., female   PCP: Riesa Pope, MD         Medical Service: Internal Medicine Teaching Service         Attending Physician: Dr. Sid Falcon, MD    First Contact: Rema Fendt Pager: [phone]  Second Contact: Linwood Dibbles Pager: [phone]       After Hours (After 5p/  First Contact Pager: [phone]  weekends / holidays): Second Contact Pager: [phone]   SUBJECTIVE   Chief Complaint: weakness  History of Present Illness:  76 year old female with a hx of HTN, anxiety and depression, DM2, and morbid obesity who presented to the ED with complaints of progressive weakness.   Patient reports that several weeks ago her air conditioning stopped working. She has been using a fan and keeping her windows open to keep house cooled, but family report the house to be very hot. EMS was called to house today for weakness and vomiting and also reported the house to be very hot. She states that she has been sweating more at home throughout the day and night and has had to change her clothes multiple times per day due to sweating. She states that she has been drinking a lot of water, juice, and coffee. She reports that her urine has been darker recently and that she is urinating less.   Her weakness has progressed over the last several weeks and has become debilitating in the past 2-3 days.  Feels her whole body feels weak,  especially her legs and she endorses dyspnea with exertion. PO intake has decreased as weakness progressed. She is normally able to prepare her own food, but has been relying on son to bring her food recently as she has not been able to walk from her bedroom to her kitchen (approx. 15 feet).  She also reports decrease in her appetite over past several days and endorses one episode of vomiting today. Denies diarrhea.   No dizziness or orthopnea,  hematochezia, or melena. No pain or cough.  She has been compliant with her home medications  Since arriving in ED, patient states she has begun to feel better, is asking for food, and family at bedside state she looks much improved (previously very red)  Meds:  No outpatient medications have been marked as taking for the 05/06/22 encounter [facility] Encounter).  Olmesartan 40 mg nightly Aspirin 81 mg BuSpar 10 mg HCTZ 25 mg daily Metformin 500 mg twice daily Pravastatin 20 mg nightly Sertraline 100 mg daily Diltiazem 420 mg daily  Past Medical History:  Diagnosis Date   Anxiety    Cardiomyopathy    Degenerative joint disease    Depression    Diabetes mellitus    NIDDM   Hypertension    Obesities, morbid (HCC)    TIA (transient ischemic attack)   Arrhythmia, glaucoma, Htn, DM  Past Surgical History:  Procedure Laterality Date   CESAREAN SECTION     times 2   EYE SURGERY     laser - right eye 2010   left wrist     surgical repair   ORIF ANKLE FRACTURE     right    Social:  Lives With: Alone 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   OBJECTIVE:   Physical Exam: Blood pressure 117/69, pulse 76, temperature (!) 102.3 F (39.1 C), temperature source Oral, resp. rate (!) 25, height '5\' 2"'$  (1.575 m), weight 122.5 kg, SpO2 93 %.  Constitutional: well-appearing female sitting in bed, in no acute  distress HENT: normocephalic atraumatic, mucous membranes moist Eyes: R eye with conjunctival erythema and drainage Neck: supple, no jvd Cardiovascular: regular rate and rhythm, 3/6 systolic murmur Pulmonary/Chest: normal work of breathing on room air, lungs clear to auscultation bilaterally Abdominal: soft, non-tender, non-distended, +BS MSK: normal bulk and tone, RLE shortened (prior ankle surgery) Neurological: alert & oriented x 3, follows commands appropriately. 5/5 strength in bilateral upper and lower extremities, sensations grossly intact. Speech fluent. CN grossly intact Skin: warm and dry Psych: mood and affect appropriate  Labs: CBC    Component Value Date/Time   WBC 7.0 05/06/2022 2030   RBC 4.44 05/06/2022 2030   HGB 13.5 05/06/2022 2030   HCT 39.9 05/06/2022 2030   PLT 249 05/06/2022 2030   MCV 89.9 05/06/2022 2030   MCH 30.4 05/06/2022 2030   MCHC 33.8 05/06/2022 2030   RDW 14.3 05/06/2022 2030   LYMPHSABS 1.4 05/06/2022 2030   MONOABS 0.2 05/06/2022 2030   EOSABS 0.0 05/06/2022 2030   BASOSABS 0.0 05/06/2022 2030     CMP     Component Value Date/Time   NA 129 (L) 05/06/2022 2030   NA 144 09/28/2020 1700   K 2.7 (LL) 05/06/2022 2030   CL 90 (L) 05/06/2022 2030   CO2 24 05/06/2022 2030   GLUCOSE 219 (H) 05/06/2022 2030   BUN 6 (L) 05/06/2022 2030   BUN 17 09/28/2020 1700   CREATININE 0.73 05/06/2022 2030   CREATININE 0.61 10/13/2016 1109   CALCIUM 8.4 (L) 05/06/2022 2030   PROT 7.1 05/06/2022 2030   ALBUMIN 3.7 05/06/2022 2030   AST 22 05/06/2022 2030   ALT 15 05/06/2022 2030   ALKPHOS 38 05/06/2022 2030   BILITOT 1.0 05/06/2022 2030   GFRNONAA >60 05/06/2022 2030   GFRNONAA >89 10/13/2016 1109   GFRAA 98 09/28/2020 1700   GFRAA >89 10/13/2016 1109    Imaging: DG Chest Portable 1 View  Result Date: 05/06/2022 CLINICAL DATA:  Fever EXAM: PORTABLE CHEST 1 VIEW COMPARISON:  01/30/2016 FINDINGS: Marked cardiomegaly. No focal airspace disease or  effusion. No visible pneumothorax. IMPRESSION: No active disease.  Cardiomegaly. Electronically Signed   By: Donavan Foil M.D.   On: 05/06/2022 23:01   CT ABDOMEN PELVIS W CONTRAST  Result Date: 05/06/2022 CLINICAL DATA:  Nausea and vomiting, abdominal pain, heat exposure EXAM: CT ABDOMEN AND PELVIS WITH CONTRAST TECHNIQUE: Multidetector CT imaging of the abdomen and pelvis was performed using the standard protocol following bolus administration of intravenous contrast. RADIATION DOSE REDUCTION: This exam was performed according to the departmental dose-optimization program which includes automated exposure control, adjustment of the mA and/or kV according to patient size and/or use of iterative reconstruction technique. CONTRAST:  181m OMNIPAQUE IOHEXOL 300 MG/ML  SOLN COMPARISON:  11/20/2011 FINDINGS: Lower chest: Heart is enlarged without  pericardial effusion. No acute pleural or parenchymal lung disease. Hepatobiliary: No focal liver abnormality is seen. No gallstones, gallbladder wall thickening, or biliary dilatation. Pancreas: Unremarkable. No pancreatic ductal dilatation or surrounding inflammatory changes. Spleen: Normal in size without focal abnormality. Adrenals/Urinary Tract: 1.5 cm simple appearing cyst lower pole right kidney does not require specific follow-up. Otherwise the kidneys enhance normally and symmetrically. No urinary tract calculi or obstructive uropathy. The adrenals are unremarkable. Small amount of gas within the bladder lumen could be due to recent catheterization. Stomach/Bowel: No bowel obstruction or ileus. Normal appendix right lower quadrant. No bowel wall thickening or inflammatory change. Vascular/Lymphatic: Aortic atherosclerosis. No enlarged abdominal or pelvic lymph nodes. Reproductive: Uterus is unremarkable. Simple appearing bilateral adnexal cysts, measuring 1.9 cm on the right and 3.1 cm on the left. Other: No free fluid or free intraperitoneal gas. No abdominal wall  hernia. Musculoskeletal: No acute or destructive bony lesions. Chronic sclerotic foci within the bony pelvis and sacrum consistent with bone islands. 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 IMPRESSION: 1. No acute intra-abdominal or intrapelvic process. 2. Multiple ovarian cysts. Most severe: 3.1 cm left adnexal simple-appearing cyst. Recommend follow-up pelvic US in 6-12 months. Reference: JACR 2020 Feb;17(2):248-254 3.  Aortic Atherosclerosis (ICD10-I70.0). Electronically Signed   By: Randa Ngo M.D.   On: 05/06/2022 22:09    EKG: personally reviewed my interpretation is prolonged QT, t wave inversions   ASSESSMENT & PLAN:    Assessment & Plan by Problem: Active Problems:   Hypokalemia   Kimberlynn Lumbra is a 76 y.o. with pertinent PMH of HTN, morbid obesity who presented with weakness, nausea and admitted for symptomatic hypokalemia on hospital day 0  # Hypokalemia # Hypomagnesemia  # Generalized weakness - K 2.7, received 50 meq in ED. Mag 0.9, received 2g - profuse sweating and low mag possible cause for low K - repeat EKG showing ectopic atrial rhythm, t wave inversions, QT prolongation - recheck mag and potassium, replete as necessary - holding hctz - am labs - telemetry - PT/OT  # Fever - initial temp 102.3, now resolved. - Heat exposure vs infection vs thyroid vs serotonin syndrome  - cxr and ct abd negative - check TSH - f/u ucx and bcx  # Hyponatremia - 129 with correction to 131 - hypovolemic vs hypotonic component - volume status appeared euvolemic - check urine osms, urine sodium, serum osm, though may be low yield as patient received 1L NS bolus in ED - IVFs as necessary - AM bmp  # Unsafe living environment - patient without working [facility]. Also has difficulty with steps. - consult social work  # Diabetes type 2 Takes metformin at home.  - A1c - CBG monitoring - SSI  # Bacterial  conjunctivitis - right eye with erythema and purulent drainage - erythromycin eye drops  # Hypertension - home regimen dil '420mg'$  and olmesartan - started on dilt and irbesartan  # Hx cardiac murmur - ECHO ordered  Diet: Carb-Modified VTE: Lovenox IVF: None,None Code: Full  Prior to Admission Living Arrangement: Home, living by self Anticipated Discharge Location: Home Barriers to Discharge: medical stability and safe living environment  Dispo: Admit patient to Observation with expected length of stay less than 2 midnights.  Signed: Delene Ruffini, MD Internal Medicine Resident PGY-1 05/07/2022, 2:25 AM

## 2022-05-06 NOTE — ED Triage Notes (Signed)
Arrives EMS from home with vomiting that began today,   Pt admits to air conditioner x 2 weeks.

## 2022-05-07 DIAGNOSIS — T675XXA Heat exhaustion, unspecified, initial encounter: Secondary | ICD-10-CM | POA: Diagnosis not present

## 2022-05-07 DIAGNOSIS — E876 Hypokalemia: Secondary | ICD-10-CM | POA: Diagnosis not present

## 2022-05-07 LAB — BASIC METABOLIC PANEL WITH GFR
Anion gap: 9 (ref 5–15)
BUN: 6 mg/dL — ABNORMAL LOW (ref 8–23)
CO2: 26 mmol/L (ref 22–32)
Calcium: 7.9 mg/dL — ABNORMAL LOW (ref 8.9–10.3)
Chloride: 96 mmol/L — ABNORMAL LOW (ref 98–111)
Creatinine, Ser: 0.58 mg/dL (ref 0.44–1.00)
GFR, Estimated: >60 mL/min
Glucose, Bld: 210 mg/dL — ABNORMAL HIGH (ref 70–99)
Potassium: 3.1 mmol/L — ABNORMAL LOW (ref 3.5–5.1)
Sodium: 131 mmol/L — ABNORMAL LOW (ref 135–145)

## 2022-05-07 LAB — CBC
HCT: 37.5 % (ref 36.0–46.0)
Hemoglobin: 12.8 g/dL (ref 12.0–15.0)
MCH: 30.5 pg (ref 26.0–34.0)
MCHC: 34.1 g/dL (ref 30.0–36.0)
MCV: 89.5 fL (ref 80.0–100.0)
Platelets: 243 K/uL (ref 150–400)
RBC: 4.19 MIL/uL (ref 3.87–5.11)
RDW: 14.3 % (ref 11.5–15.5)
WBC: 8.5 K/uL (ref 4.0–10.5)
nRBC: 0 % (ref 0.0–0.2)

## 2022-05-07 LAB — GLUCOSE, CAPILLARY
Glucose-Capillary: 156 mg/dL — ABNORMAL HIGH (ref 70–99)
Glucose-Capillary: 256 mg/dL — ABNORMAL HIGH (ref 70–99)

## 2022-05-07 LAB — HEMOGLOBIN A1C
Hgb A1c MFr Bld: 9.3 % — ABNORMAL HIGH (ref 4.8–5.6)
Mean Plasma Glucose: 220.21 mg/dL

## 2022-05-07 LAB — BASIC METABOLIC PANEL
Anion gap: 10 (ref 5–15)
BUN: 8 mg/dL (ref 8–23)
CO2: 28 mmol/L (ref 22–32)
Calcium: 8.3 mg/dL — ABNORMAL LOW (ref 8.9–10.3)
Chloride: 98 mmol/L (ref 98–111)
Creatinine, Ser: 0.58 mg/dL (ref 0.44–1.00)
GFR, Estimated: >60 mL/min (ref >60)
Glucose, Bld: 182 mg/dL — ABNORMAL HIGH (ref 70–99)
Potassium: 3.6 mmol/L (ref 3.5–5.1)
Sodium: 136 mmol/L (ref 135–145)

## 2022-05-07 LAB — TSH: TSH: 0.737 u[IU]/mL (ref 0.350–4.500)

## 2022-05-07 LAB — SODIUM, URINE, RANDOM: Sodium, Ur: 23 mmol/L

## 2022-05-07 LAB — CBG MONITORING, ED
Glucose-Capillary: 166 mg/dL — ABNORMAL HIGH (ref 70–99)
Glucose-Capillary: 167 mg/dL — ABNORMAL HIGH (ref 70–99)

## 2022-05-07 LAB — OSMOLALITY: Osmolality: 280 mOsm/kg (ref 275–295)

## 2022-05-07 LAB — MAGNESIUM
Magnesium: 1.4 mg/dL — ABNORMAL LOW (ref 1.7–2.4)
Magnesium: 1.6 mg/dL — ABNORMAL LOW (ref 1.7–2.4)

## 2022-05-07 LAB — CK: Total CK: 93 U/L (ref 38–234)

## 2022-05-07 LAB — OSMOLALITY, URINE: Osmolality, Ur: 158 mOsm/kg — ABNORMAL LOW (ref 300–900)

## 2022-05-07 MED ORDER — DILTIAZEM HCL ER BEADS 300 MG PO CP24: 420.0000 mg | ORAL_CAPSULE | Freq: Every day | ORAL | Status: DC

## 2022-05-07 MED ORDER — ERYTHROMYCIN 5 MG/GM OP OINT
TOPICAL_OINTMENT | Freq: Three times a day (TID) | OPHTHALMIC | Status: DC
  Filled 2022-05-07 (×2): qty 3.5

## 2022-05-07 MED ORDER — IRBESARTAN 300 MG PO TABS
300.0000 mg | ORAL_TABLET | Freq: Every day | ORAL | Status: DC
  Administered 2022-05-08: 300 mg via ORAL
  Filled 2022-05-07: qty 1

## 2022-05-07 MED ORDER — ACETAMINOPHEN 650 MG RE SUPP: 650.0000 mg | Freq: Four times a day (QID) | RECTAL | Status: DC | PRN

## 2022-05-07 MED ORDER — ASPIRIN 81 MG PO TBEC
81.0000 mg | DELAYED_RELEASE_TABLET | Freq: Every morning | ORAL | Status: DC
  Administered 2022-05-07 – 2022-05-08 (×2): 81 mg via ORAL
  Filled 2022-05-07 (×2): qty 1

## 2022-05-07 MED ORDER — METFORMIN HCL 500 MG PO TABS: 500.0000 mg | ORAL_TABLET | Freq: Two times a day (BID) | ORAL | Status: DC

## 2022-05-07 MED ORDER — ACETAMINOPHEN 325 MG PO TABS: 650.0000 mg | ORAL_TABLET | Freq: Four times a day (QID) | ORAL | Status: DC | PRN

## 2022-05-07 MED ORDER — MAGNESIUM SULFATE 2 GM/50ML IV SOLN
2.0000 g | Freq: Once | INTRAVENOUS | Status: AC
  Administered 2022-05-07: 2 g via INTRAVENOUS
  Filled 2022-05-07: qty 50

## 2022-05-07 MED ORDER — POTASSIUM CHLORIDE 10 MEQ/100ML IV SOLN
10.0000 meq | INTRAVENOUS | Status: AC
  Administered 2022-05-07 (×3): 10 meq via INTRAVENOUS
  Filled 2022-05-07 (×3): qty 100

## 2022-05-07 MED ORDER — POTASSIUM CHLORIDE CRYS ER 20 MEQ PO TBCR
40.0000 meq | EXTENDED_RELEASE_TABLET | Freq: Two times a day (BID) | ORAL | Status: DC
Start: 2022-05-07 — End: 2022-05-08
  Administered 2022-05-07 – 2022-05-08 (×3): 40 meq via ORAL
  Filled 2022-05-07 (×3): qty 2

## 2022-05-07 MED ORDER — INSULIN ASPART 100 UNIT/ML IJ SOLN
0.0000 [IU] | Freq: Three times a day (TID) | INTRAMUSCULAR | Status: DC
  Administered 2022-05-07 – 2022-05-08 (×5): 2 [IU] via SUBCUTANEOUS
  Administered 2022-05-08: 3 [IU] via SUBCUTANEOUS

## 2022-05-07 MED ORDER — PRAVASTATIN SODIUM 10 MG PO TABS
20.0000 mg | ORAL_TABLET | Freq: Every day | ORAL | Status: DC
  Administered 2022-05-07: 20 mg via ORAL
  Filled 2022-05-07: qty 2

## 2022-05-07 MED ORDER — ENOXAPARIN SODIUM 40 MG/0.4ML IJ SOSY: 40.0000 mg | PREFILLED_SYRINGE | INTRAMUSCULAR | Status: DC

## 2022-05-07 MED ORDER — DILTIAZEM HCL ER COATED BEADS 180 MG PO CP24
420.0000 mg | ORAL_CAPSULE | Freq: Every day | ORAL | Status: DC
  Administered 2022-05-07 – 2022-05-08 (×2): 420 mg via ORAL
  Filled 2022-05-07: qty 2
  Filled 2022-05-07: qty 1
  Filled 2022-05-07: qty 2

## 2022-05-07 MED ORDER — POTASSIUM CHLORIDE CRYS ER 20 MEQ PO TBCR: 40.0000 meq | EXTENDED_RELEASE_TABLET | Freq: Three times a day (TID) | ORAL | Status: DC

## 2022-05-07 MED ORDER — MAGNESIUM SULFATE 2 GM/50ML IV SOLN
2.0000 g | Freq: Once | INTRAVENOUS | Status: AC
Start: 2022-05-07 — End: 2022-05-07
  Administered 2022-05-07: 2 g via INTRAVENOUS
  Filled 2022-05-07: qty 50

## 2022-05-07 NOTE — Progress Notes (Signed)
Subjective:   Hospital day:1  Overnight event: Patient admitted overnight.  Interim History: Patient evaluated on the bedside laying comfortably in bed.  Patient states she feels better overall but is still very tired as she was unable to sleep much last night.  Thinks her AC works fine but someone left the door open which caused her house to be warm. She does not remember much of the events leading up to the admission.  She denies any localized pain.  Objective:  Vital signs in last 24 hours: Vitals:   05/07/22 1000 05/07/22 1100 05/07/22 1300 05/07/22 1400  BP: (!) 128/92 139/86 (!) 136/122 (!) 136/122  Pulse: 70 64 80 75  Resp: 19 14 (!) 21 13  Temp:      TempSrc:      SpO2: 98% 97% 98% 100%  Weight:      Height:        Filed Weights   05/06/22 2013  Weight: 122.5 kg     Intake/Output Summary (Last 24 hours) at 05/07/2022 1535 Last data filed at 05/07/2022 0704 Gross per 24 hour  Intake --  Output 1000 ml  Net -1000 ml   Net IO Since Admission: -1,000 mL [05/07/22 1535]  Recent Labs    05/07/22 0809 05/07/22 1204  GLUCAP 166* 167*     Pertinent Labs:    Latest Ref Rng & Units 05/07/2022    3:03 AM 05/06/2022    8:30 PM 05/06/2016    4:34 PM  CBC  WBC 4.0 - 10.5 K/uL 8.5  7.0  10.4   Hemoglobin 12.0 - 15.0 g/dL 12.8  13.5  13.8   Hematocrit 36.0 - 46.0 % 37.5  39.9  41.3   Platelets 150 - 400 K/uL 243  249  264        Latest Ref Rng & Units 05/07/2022    1:00 PM 05/07/2022    3:03 AM 05/06/2022    8:30 PM  CMP  Glucose 70 - 99 mg/dL 182  210  219   BUN 8 - 23 mg/dL '8  6  6   '$ Creatinine 0.44 - 1.00 mg/dL 0.58  0.58  0.73   Sodium 135 - 145 mmol/L 136  131  129   Potassium 3.5 - 5.1 mmol/L 3.6  3.1  2.7   Chloride 98 - 111 mmol/L 98  96  90   CO2 22 - 32 mmol/L '28  26  24   '$ Calcium 8.9 - 10.3 mg/dL 8.3  7.9  8.4   Total Protein 6.5 - 8.1 g/dL   7.1   Total Bilirubin 0.3 - 1.2 mg/dL   1.0   Alkaline Phos 38 - 126 U/L   38   AST 15 - 41 U/L   22   ALT 0 -  44 U/L   15     Imaging: DG Chest Portable 1 View  Result Date: 05/06/2022 CLINICAL DATA:  Fever EXAM: PORTABLE CHEST 1 VIEW COMPARISON:  01/30/2016 FINDINGS: Marked cardiomegaly. No focal airspace disease or effusion. No visible pneumothorax. IMPRESSION: No active disease.  Cardiomegaly. Electronically Signed   By: Donavan Foil M.D.   On: 05/06/2022 23:01   CT ABDOMEN PELVIS W CONTRAST  Result Date: 05/06/2022 CLINICAL DATA:  Nausea and vomiting, abdominal pain, heat exposure EXAM: CT ABDOMEN AND PELVIS WITH CONTRAST TECHNIQUE: Multidetector CT imaging of the abdomen and pelvis was performed using the standard protocol following bolus administration of intravenous contrast. RADIATION DOSE REDUCTION: This exam was  performed according to the departmental dose-optimization program which includes automated exposure control, adjustment of the mA and/or kV according to patient size and/or use of iterative reconstruction technique. CONTRAST:  158m OMNIPAQUE IOHEXOL 300 MG/ML  SOLN COMPARISON:  11/20/2011 FINDINGS: Lower chest: Heart is enlarged without pericardial effusion. No acute pleural or parenchymal lung disease. Hepatobiliary: No focal liver abnormality is seen. No gallstones, gallbladder wall thickening, or biliary dilatation. Pancreas: Unremarkable. No pancreatic ductal dilatation or surrounding inflammatory changes. Spleen: Normal in size without focal abnormality. Adrenals/Urinary Tract: 1.5 cm simple appearing cyst lower pole right kidney does not require specific follow-up. Otherwise the kidneys enhance normally and symmetrically. No urinary tract calculi or obstructive uropathy. The adrenals are unremarkable. Small amount of gas within the bladder lumen could be due to recent catheterization. Stomach/Bowel: No bowel obstruction or ileus. Normal appendix right lower quadrant. No bowel wall thickening or inflammatory change. Vascular/Lymphatic: Aortic atherosclerosis. No enlarged abdominal or pelvic  lymph nodes. Reproductive: Uterus is unremarkable. Simple appearing bilateral adnexal cysts, measuring 1.9 cm on the right and 3.1 cm on the left. Other: No free fluid or free intraperitoneal gas. No abdominal wall hernia. Musculoskeletal: No acute or destructive bony lesions. Chronic sclerotic foci within the bony pelvis and sacrum consistent with bone islands. 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 IMPRESSION: 1. No acute intra-abdominal or intrapelvic process. 2. Multiple ovarian cysts. Most severe: 3.1 cm left adnexal simple-appearing cyst. Recommend follow-up pelvic [place] 6-12 months. Reference: JACR 2020 Feb;17(2):248-254 3.  Aortic Atherosclerosis (ICD10-I70.0). Electronically Signed   By: MRanda NgoM.D.   On: 05/06/2022 22:09    Physical Exam  General: Pleasant, morbidly obese woman laying in bed. No acute distress. HEENT: [place]/AT. Mild erythema in the R eye.  CV: RRR. III/VI systolic murmur.  No rubs or gallops. Trace BLE edema up to mid leg.  Pulmonary: Lungs CTAB. Normal effort. No wheezing or rales. Abdominal: Soft, nontender, nondistended. Normal bowel sounds. Extremities: 2+ distal pulses. Normal ROM. Skin: Warm and dry. No obvious rash or lesions. Neuro: A&Ox3. Moves all extremities. Normal sensation to gross touch.  Psych: Normal mood and affect   Assessment/Plan: Allison Henneyis a 76y.o. female with hx of HTN, anxiety, depression, T2DM and class III obesity who presented for progressive weakness found to have electrolyte abnormalities secondary to heat exhaustion.  Active Problems:   Hypokalemia  #Heat exhaustion #Generalized weakness #Hypokalemia #Hypomagnesemia Patient remains little fatigued this morning but feels symptoms have improved compared to how she felt on admission. Electrolytes have been repleted.  Potassium up to 3.6 this afternoon. Magnesium improved to 1.6.  PT/OT evaluated patient and  recommended home health with PT/OT.  We will work with CSW to ensure patient's home is safe for her to go back tomorrow. -Give additional KCl 40 mEq x 1 dose today -IV mag sulfate 2 mg x1 dose -Continue to encourage p.o. intake -Trend electrolytes, BMP -Pending discharge home tomorrow with home health PT/OT/aide/social worker  #Systolic murmur #Hypertension Patient found to have 3/6 systolic murmur on admission. Patient with lower signs of heart failure exacerbation. She does report some dyspnea on exertion at home making it difficult for her to go upstairs. No recent echo on file. BP still slightly elevated with SBP in the 130s to 150s. -Follow-up echo -Continue diltiazem for 420 mg daily and irbesartan 300 mg daily  #T2DM #Class III obesity Repeat A1c on admission 9.3%. This is an increase from 6.8% 11 months ago. CBGs  in the 160s since admission.  Patient has a BMI of 49.38.  She would likely benefit from starting a GLP-1 agonist and SGLT2 inhibitor in the outpatient. -SSI with meals -CBG monitoring  #Conjunctivitis Patient found to have mild erythema in the right eye with slight purulent drainage. -Continue erythromycin eyedrops  #Hyperlipidemia -Continue pravastatin 20 mg daily and ASA 81 mg  Diet: CM IVF: N/A VTE: Lovenox CODE: Full  Prior to Admission Living Arrangement: Home Anticipated Discharge Location: Home with home health Barriers to Discharge: Medical stability Dispo: Anticipated discharge in approximately 1-2 day(s).   Signed: Lacinda Axon, MD 05/07/2022, 3:35 PM  Pager: [phone] Internal Medicine Teaching Service After 5pm on weekdays and 1pm on weekends: On Call pager: [phone]

## 2022-05-07 NOTE — Evaluation (Signed)
Physical Therapy Evaluation Patient Details Name: Allison Caldwell MRN: [id number] DOB: [birth year] Today's Date: 05/07/2022  History of Present Illness  76 year old female with a hx of HTN, anxiety and depression, DM2, and morbid obesity who presented to the ED with complaints of progressive weakness. Her air conditioning is out at home and she has been declining over past two weeks.  Clinical Impression   Pt admitted secondary to problem above with deficits below. PTA patient was living alone in an apartment where she has 2 steps to enter (pt reports they are sort of tall and she has to use 2 walking sticks to ascend or descend). Denies use of walking sticks inside her home.  Pt currently requires minguard assist for all mobility without a device. Orthostatic BPs done and negative for orthostasis. Patient agrees with plan to return home.  Anticipate patient will benefit from PT to address problems listed below.Will continue to follow acutely to maximize functional mobility independence and safety.          Recommendations for follow up therapy are one component of a multi-disciplinary discharge planning process, led by the attending physician.  Recommendations may be updated based on patient status, additional functional criteria and insurance authorization.  Follow Up Recommendations Home health PT      Assistance Recommended at Discharge PRN  Patient can return home with the following  Help with stairs or ramp for entrance;Assistance with cooking/housework    Equipment Recommendations None recommended by PT  Recommendations for Other Services       Functional Status Assessment Patient has had a recent decline in their functional status and demonstrates the ability to make significant improvements in function in a reasonable and predictable amount of time.     Precautions / Restrictions Precautions Precautions: Fall Restrictions Weight Bearing Restrictions: No       Mobility  Bed Mobility Overal bed mobility: Modified Independent             General bed mobility comments: Increased time and some difficulty due to height of stretcher in ED; both supine to sit and sit to supine    Transfers Overall transfer level: Needs assistance Equipment used: None Transfers: Sit to/from Stand Sit to Stand: Min guard           General transfer comment: Min guard for safety due to pt feeling woozy.    Ambulation/Gait Ambulation/Gait assistance: Min guard Gait Distance (Feet): 2 Feet Assistive device: None Gait Pattern/deviations: Step-to pattern       General Gait Details: side steps to [facility] after completing orthostatic vital signs  Stairs            Wheelchair Mobility    Modified Rankin (Stroke Patients Only)       Balance Overall balance assessment: Needs assistance Sitting-balance support: No upper extremity supported, Feet unsupported Sitting balance-Leahy Scale: Fair     Standing balance support: No upper extremity supported Standing balance-Leahy Scale: Fair                               Pertinent Vitals/Pain Pain Assessment Pain Assessment: No/denies pain    Home Living Family/patient expects to be discharged to:: Private residence Living Arrangements: Alone Available Help at Discharge: Family;Available PRN/intermittently Type of Home: Apartment Home Access: Stairs to enter Entrance Stairs-Rails: None Entrance Stairs-Number of Steps: 2   Home Layout: One level Home Equipment: Cane - single point;Shower seat  Prior Function Prior Level of Function : Needs assist             Mobility Comments: uses 2 walking sticks in community; no device inside home ADLs Comments: usually sits at sink to bathe; son does grocery shopping     Hand Dominance   Dominant Hand: Right    Extremity/Trunk Assessment   Upper Extremity Assessment Upper Extremity Assessment: Defer to OT evaluation     Lower Extremity Assessment Lower Extremity Assessment: Generalized weakness    Cervical / Trunk Assessment Cervical / Trunk Assessment: Other exceptions Cervical / Trunk Exceptions: overweight  Communication   Communication: No difficulties  Cognition Arousal/Alertness: Awake/alert Behavior During Therapy: WFL for tasks assessed/performed Overall Cognitive Status: Within Functional Limits for tasks assessed                                 General Comments: Pt appears near her baseline, seems to have poor awareness of her deficits, focused on not wanting to worry her son.        General Comments General comments (skin integrity, edema, etc.): Patient reported feeling woozy from morning meds (although RN reports no meds should have caused wooziness). BP stable with orthostatics (see flowsheet)    Exercises     Assessment/Plan    PT Assessment Patient needs continued PT services  PT Problem List Decreased strength;Decreased balance;Decreased mobility;Obesity       PT Treatment Interventions Gait training;DME instruction;Stair training;Functional mobility training;Therapeutic activities;Therapeutic exercise;Balance training;Patient/family education    PT Goals (Current goals can be found in the Care Plan section)  Acute Rehab PT Goals Patient Stated Goal: return home PT Goal Formulation: With patient Time For Goal Achievement: 05/21/22 Potential to Achieve Goals: Good    Frequency Min 3X/week     Co-evaluation               AM-PAC PT "6 Clicks" Mobility  Outcome Measure Help needed turning from your back to your side while in a flat bed without using bedrails?: None Help needed moving from lying on your back to sitting on the side of a flat bed without using bedrails?: None Help needed moving to and from a bed to a chair (including a wheelchair)?: A Little Help needed standing up from a chair using your arms (e.g., wheelchair or bedside chair)?:  A Little Help needed to walk in hospital room?: A Little Help needed climbing 3-5 steps with a railing? : A Little 6 Click Score: 20    End of Session   Activity Tolerance: Patient tolerated treatment well Patient left: in bed;with call bell/phone within reach (on ED stretcher) Nurse Communication: Mobility status PT Visit Diagnosis: Muscle weakness (generalized) (M62.81)    Time: 3710-6269 PT Time Calculation (min) (ACUTE ONLY): 27 min   Charges:   PT Evaluation $PT Eval Moderate Complexity: [place], PT Acute Rehabilitation Services  Office [phone]   Rexanne Mano 05/07/2022, 12:12 PM

## 2022-05-07 NOTE — Evaluation (Signed)
Occupational Therapy Evaluation Patient Details Name: Allison Caldwell MRN: [id number] DOB: 08-Nov-1945 Today's Date: 05/07/2022   History of Present Illness 76 year old female with a hx of HTN, anxiety and depression, DM2, and morbid obesity who presented to the ED with complaints of progressive weakness. Her air conditioning is out at home and she has been declining over past two weeks.   Clinical Impression   Pt admitted for concerns listed above. PTA pt reported that she was fairly independent with all ADL's and IADL's. At this time, pt is overall requiring min guard to min A for all ADL's and functional mobility. She demonstrates increased weakness and decreased activity tolerance. Recommending HHOT to maximize her independence and safety OT will follow acutely.       Recommendations for follow up therapy are one component of a multi-disciplinary discharge planning process, led by the attending physician.  Recommendations may be updated based on patient status, additional functional criteria and insurance authorization.   Follow Up Recommendations  Home health OT    Assistance Recommended at Discharge Frequent or constant Supervision/Assistance  Patient can return home with the following A little help with walking and/or transfers;A little help with bathing/dressing/bathroom;Help with stairs or ramp for entrance;Assist for transportation    Functional Status Assessment  Patient has had a recent decline in their functional status and demonstrates the ability to make significant improvements in function in a reasonable and predictable amount of time.  Equipment Recommendations  Tub/shower bench    Recommendations for Other Services       Precautions / Restrictions Precautions Precautions: Fall Restrictions Weight Bearing Restrictions: No      Mobility Bed Mobility Overal bed mobility: Modified Independent             General bed mobility comments: Increased time and  some difficulty due to height of stretcher in ED    Transfers Overall transfer level: Needs assistance Equipment used: None Transfers: Sit to/from Stand Sit to Stand: Min guard           General transfer comment: Min guard for safety due to pt feeling woozy.      Balance                                           ADL either performed or assessed with clinical judgement   ADL Overall ADL's : Needs assistance/impaired Eating/Feeding: Set up;Sitting   Grooming: Min guard;Standing   Upper Body Bathing: Supervision/ safety;Sitting   Lower Body Bathing: Minimal assistance;Sitting/lateral leans;Sit to/from stand   Upper Body Dressing : Supervision/safety;Sitting   Lower Body Dressing: Minimal assistance;Sitting/lateral leans;Sit to/from stand   Toilet Transfer: Min guard;Minimal assistance;Ambulation   Toileting- Clothing Manipulation and Hygiene: Minimal assistance;Sitting/lateral lean;Sit to/from stand       Functional mobility during ADLs: Min guard General ADL Comments: Pt presents with weakness and pain in her R hip, limiting her ability to complete ADL's without assist.     Vision Baseline Vision/History: 1 Wears glasses Ability to See in Adequate Light: 0 Adequate Patient Visual Report: No change from baseline Vision Assessment?: No apparent visual deficits     Perception     Praxis      Pertinent Vitals/Pain Pain Assessment Pain Assessment: No/denies pain     Hand Dominance Right   Extremity/Trunk Assessment Upper Extremity Assessment Upper Extremity Assessment: Defer to OT evaluation   Lower  Extremity Assessment Lower Extremity Assessment: Generalized weakness   Cervical / Trunk Assessment Cervical / Trunk Assessment: Kyphotic;Other exceptions Cervical / Trunk Exceptions: Body habitus   Communication Communication Communication: No difficulties   Cognition Arousal/Alertness: Awake/alert Behavior During Therapy: WFL for  tasks assessed/performed Overall Cognitive Status: Within Functional Limits for tasks assessed                                 General Comments: Pt appears near her baseline, seems to have poor awareness of her deficits, focused on not wanting to worry her son.     General Comments  VSS, Pt reports feeling woozy with postural changes    Exercises     Shoulder Instructions      Home Living Family/patient expects to be discharged to:: Private residence Living Arrangements: Alone Available Help at Discharge: Family;Available PRN/intermittently Type of Home: Apartment Home Access: Stairs to enter Entrance Stairs-Number of Steps: 2 Entrance Stairs-Rails: None Home Layout: One level     Bathroom Shower/Tub: Teacher, early years/pre: Standard     Home Equipment: Cane - single point;Shower seat          Prior Functioning/Environment Prior Level of Function : Needs assist             Mobility Comments: uses 2 walking sticks are for in community ADLs Comments: usually sits at sink to bathe; son does grocery shopping        OT Problem List: Decreased strength;Decreased activity tolerance;Impaired balance (sitting and/or standing);Cardiopulmonary status limiting activity      OT Treatment/Interventions: Self-care/ADL training;Therapeutic exercise;Energy conservation;DME and/or AE instruction;Therapeutic activities;Patient/family education;Balance training    OT Goals(Current goals can be found in the care plan section) Acute Rehab OT Goals Patient Stated Goal: To go home OT Goal Formulation: With patient Time For Goal Achievement: 05/21/22 Potential to Achieve Goals: Good ADL Goals Pt Will Perform Grooming: with modified independence;standing Pt Will Perform Lower Body Bathing: with modified independence;sitting/lateral leans;sit to/from stand Pt Will Perform Lower Body Dressing: with modified independence;sitting/lateral leans;sit to/from  stand Pt Will Transfer to Toilet: with modified independence;ambulating Pt Will Perform Toileting - Clothing Manipulation and hygiene: with modified independence;sitting/lateral leans;sit to/from stand  OT Frequency: Min 2X/week    Co-evaluation              AM-PAC OT "6 Clicks" Daily Activity     Outcome Measure Help from another person eating meals?: A Little Help from another person taking care of personal grooming?: A Little Help from another person toileting, which includes using toliet, bedpan, or urinal?: A Little Help from another person bathing (including washing, rinsing, drying)?: A Little Help from another person to put on and taking off regular upper body clothing?: A Little Help from another person to put on and taking off regular lower body clothing?: A Little 6 Click Score: 18   End of Session Equipment Utilized During Treatment: Gait belt Nurse Communication: Mobility status  Activity Tolerance: Patient tolerated treatment well Patient left: in bed;with call bell/phone within reach  OT Visit Diagnosis: Unsteadiness on feet (R26.81);Other abnormalities of gait and mobility (R26.89);Muscle weakness (generalized) (M62.81)                Time: 6433-2951 OT Time Calculation (min): 21 min Charges:  OT General Charges $OT Visit: 1 Visit OT Evaluation $OT Eval Moderate Complexity: [place]., OTR/L Acute Rehabilitation  Laiba Fuerte Elane Yolanda Bonine  05/07/2022, 12:47 PM

## 2022-05-07 NOTE — TOC Initial Note (Addendum)
Transition of Care [facility]) - Initial/Assessment Note    Patient Details  Name: Allison Caldwell MRN: [id number] Date of Birth: 02-Aug-1946  Transition of Care [facility]) CM/SW Contact:    Verdell Carmine, RN Phone Number: 05/07/2022, 11:53 AM  Clinical Narrative:                 Patient was getting cleaned up, spoke with son who was here last night. He states he sees him mother every other day and she is getting more weak. She has lived in her aprtment for 23 years. They are not addressing things like the air conditioner situation, he has called and is planning to go over to the office on Monday. PT will be assessing patient, the patient may need SNF to  improve weakness. Son is planning on coming in soon . He does not have a lower bedroom at his home and she cannot walk steps. Any more, that is why she stays in her apartment all the time.  She is having trouble with ADL's . If not SNF then Home Health PT OT aide and social work. May need DME as well.  CM will follow for needs 1230 PT assessment revealed Home Health recommendation. Looked up housing to see if there was a office for apartments, could not find it . Will wait for son to come, he has the number. He bought her a fan prior to assist, but the air condition needs to be addressed before patient returning.    Barriers to Discharge: Continued Medical Work up   Patient Goals and CMS Choice        Expected Discharge Plan and Services   In-house Referral: Clinical Social Work Discharge Planning Services: CM Consult                                          Prior Living Arrangements/Services   Lives with:: Self Patient language and need for interpreter reviewed:: Yes Do you feel safe going back to the place where you live?: No   Air conditioner broken  Need for Family Participation in Patient Care: Yes (Comment) Care giver support system in place?: Yes (comment)   Criminal Activity/Legal Involvement Pertinent to  Current Situation/Hospitalization: No - Comment as needed  Activities of Daily Living      Permission Sought/Granted Permission sought to share information with : Case Manager                Emotional Assessment       Orientation: : Oriented to Self Alcohol / Substance Use: Not Applicable Psych Involvement: No (comment)  Admission diagnosis:  Hypokalemia [E87.6] Patient Active Problem List   Diagnosis Date Noted   Hypokalemia 05/07/2022   Bilateral hearing loss due to cerumen impaction 05/27/2021   Peeling skin 19/14/7829   Systolic murmur 56/21/3086   Benign paroxysmal positional vertigo 04/05/2018   Elevated rheumatoid factor 10/16/2016   Migratory pain 10/10/2016   Vitiligo 05/05/2016   Healthcare maintenance 12/23/2010   Hypertension associated with diabetes ([place]) 05/12/2010   ANXIETY DEPRESSION 04/06/2010   GLAUCOMA 01/20/2009   Hyperlipidemia associated with type 2 diabetes mellitus ([place]) 11/20/2008   Diabetes type 2, controlled ([place]) 12/27/2006   Morbid obesity with BMI of 45.0-49.9, adult ([place]) 12/27/2006   PCP:  Riesa Pope, MD Pharmacy:   [place] - [address] [place] [address] Phone: [phone] Fax: [phone]  [facility] [place], [place] [address] Phone: [phone] Fax: [id number] [address], [address] [place] [address] Phone: [phone] Fax: [phone]     Social Determinants of Health (SDOH) Interventions    Readmission Risk Interventions     No data to display

## 2022-05-08 ENCOUNTER — Observation Stay (HOSPITAL_BASED_OUTPATIENT_CLINIC_OR_DEPARTMENT_OTHER): Payer: Medicare HMO

## 2022-05-08 DIAGNOSIS — R011 Cardiac murmur, unspecified: Secondary | ICD-10-CM

## 2022-05-08 DIAGNOSIS — E876 Hypokalemia: Secondary | ICD-10-CM | POA: Diagnosis not present

## 2022-05-08 DIAGNOSIS — T675XXA Heat exhaustion, unspecified, initial encounter: Secondary | ICD-10-CM | POA: Diagnosis not present

## 2022-05-08 LAB — BASIC METABOLIC PANEL
Anion gap: 10 (ref 5–15)
BUN: 11 mg/dL (ref 8–23)
CO2: 24 mmol/L (ref 22–32)
Calcium: 8.6 mg/dL — ABNORMAL LOW (ref 8.9–10.3)
Chloride: 100 mmol/L (ref 98–111)
Creatinine, Ser: 0.54 mg/dL (ref 0.44–1.00)
GFR, Estimated: >60 mL/min (ref >60)
Glucose, Bld: 177 mg/dL — ABNORMAL HIGH (ref 70–99)
Potassium: 4.3 mmol/L (ref 3.5–5.1)
Sodium: 134 mmol/L — ABNORMAL LOW (ref 135–145)

## 2022-05-08 LAB — GLUCOSE, CAPILLARY
Glucose-Capillary: 231 mg/dL — ABNORMAL HIGH (ref 70–99)
Glucose-Capillary: 184 mg/dL — ABNORMAL HIGH (ref 70–99)
Glucose-Capillary: 179 mg/dL — ABNORMAL HIGH (ref 70–99)

## 2022-05-08 LAB — ECHOCARDIOGRAM COMPLETE
Calc EF: 51.6 %
Height: 62 in
S' Lateral: 2.5 cm
Single Plane A2C EF: 52.9 %
Single Plane A4C EF: 48.8 %
Weight: 4261.05 oz

## 2022-05-08 LAB — MAGNESIUM: Magnesium: 1.7 mg/dL (ref 1.7–2.4)

## 2022-05-08 MED ORDER — ERYTHROMYCIN 5 MG/GM OP OINT: TOPICAL_OINTMENT | Freq: Three times a day (TID) | OPHTHALMIC | 0 refills | Status: DC

## 2022-05-08 MED ORDER — PERFLUTREN LIPID MICROSPHERE
1.0000 mL | INTRAVENOUS | Status: AC | PRN
  Administered 2022-05-08: 2 mL via INTRAVENOUS
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
## 2022-05-08 NOTE — Care Management Obs Status (Signed)
[place] NOTIFICATION   Patient Details  Name: Allison Caldwell MRN: [id number] Date of Birth: Jul 17, 1946   Medicare Observation Status Notification Given:  Yes    Bartholomew Crews, RN 05/08/2022, 9:30 AM

## 2022-05-08 NOTE — Discharge Summary (Signed)
Name: Allison Caldwell MRN: [id number] DOB: [birth year] 76 y.o. PCP: Riesa Pope, MD  Date of Admission: 05/06/2022  7:56 PM Date of Discharge:   05/09/2019 Attending Physician: Dr. Daryll Drown  Discharge Diagnosis: Heat exhaustion with associated electrolyte abnormalities     Discharge Medications: Allergies as of 05/08/2022       Reactions   Ace Inhibitors Other (See Comments)    cough   Penicillins Other (See Comments)   Muscle spasms, Has patient had a PCN reaction causing immediate rash, facial/tongue/throat swelling, SOB or lightheadedness with hypotension: Yes Has patient had a PCN reaction causing severe rash involving mucus membranes or skin necrosis: No Has patient had a PCN reaction that required hospitalization No Has patient had a PCN reaction occurring within the last 10 years: Yes If all of the above answers are "NO", then may proceed with Cephalosporin use.        Medication List     TAKE these medications    Accu-Chek FastClix Lancets Misc Check blood sugar 1 time a day   Accu-Chek Guide test strip Generic drug: glucose blood Check blood sugar 1 time per day   Accu-Chek Guide w/Device Kit 1 each by Does not apply route daily. Check blood sugar 1 time a day   aspirin EC 81 MG tablet Take 1 tablet (81 mg total) by mouth every morning.   busPIRone 10 MG tablet Commonly known as: BUSPAR Take 1 tablet (10 mg total) by mouth 3 (three) times daily.   erythromycin ophthalmic ointment Place into the right eye every 8 (eight) hours.   hydrochlorothiazide 25 MG tablet Commonly known as: HYDRODIURIL TAKE 1 TABLET EVERY DAY   metFORMIN 500 MG tablet Commonly known as: GLUCOPHAGE TAKE 1 TABLET TWICE DAILY  WITH  MEALS   multivitamin with minerals Tabs tablet Take 1 tablet by mouth daily.   olmesartan 40 MG tablet Commonly known as: BENICAR TAKE 1 TABLET AT BEDTIME What changed: when to take this   pravastatin 20 MG tablet Commonly known  as: PRAVACHOL TAKE 1 TABLET AT BEDTIME What changed: when to take this   sertraline 100 MG tablet Commonly known as: ZOLOFT TAKE 1 TABLET EVERY MORNING What changed: when to take this   Tiadylt ER 420 MG 24 hr capsule Generic drug: diltiazem TAKE 1 CAPSULE EVERY DAY What changed: how much to take        Disposition and follow-up:   Allison Caldwell was discharged from [facility] in Stable condition.  At the hospital follow up visit please address:  1.  Follow-up:  a.  Please reassess whether Allison Caldwell continues to be in a safe home environment.  Specifically whether her air conditioning has been fixed.    b.  A1c went 9.3 on this admission and blood sugars elevated.  She could benefit from tighter control of her blood sugars in the outpatient setting.   c.  Confirm resolution of her conjunctivitis.   d.  Family is requesting for her to have a life alert  2.  Labs / imaging needed at time of follow-up: CBC, BMP, CBG  3.  Pending labs/ test needing follow-up:  Follow-up Appointments:  Follow-up Information     Care, [facility] Follow up.   Specialty: Home Health Services Why: Someone from the office will call to schedule home health visits Contact information: [address] [place] [address] [phone]  Front desk contacted for follow up appt with [facility] Course by problem list: Allison Caldwell is a 76 y.o. female with pertinent past medical history of hypertension, anxiety, depression, type 2 diabetes, class III obesity presented for progressive weakness and vomiting and found to have electrolyte abnormalities secondary to heat exhaustion.  Lab abnormalities and symptoms quickly resolved with electrolyte repletion and aggressive fluid resuscitation and patient discharged to safe home environment.  #Heat exhaustion #Generalized  weakness #Hypokalemia #Hypomagnesemia #Hyponatremia Patient brought to ED by EMS with complaints of generalized weakness/vomiting in the context of living in a very hot home with family members also endorsing possible heat exhaustion.  On initial examination she was febrile at 102.3, with elevated respiratory rate of 25 and profusely sweating. Upon initial ED evaluation patient had K 2.7, mag 0.9, corrected sodium 131, and initial EKG showing T wave inversions and QT prolongation.  Infectious etiologies were ruled out.  HCTZ was held in the ED and electrolytes were repleted as necessary with IV fluid resuscitation as well.  Patient quickly stabilized with subsequent K 4.3, Na 134, Mg 1.7 on the day of discharge.  Discharge plan with patient and social work to confirm safe environment upon release from hospital.  #Systolic murmur #Hypertension 3 out of 6 systolic murmur heard on initial exam with reports of generalized weakness and dyspnea on exertion as well.  Echo largely normal except for aortic stenosis.  Blood pressures stable in the hospital on regimen of diltiazem 420 mg daily and irbesartan 300 mg daily.  #T2DM #Class III obesity A1c on admission 9.3.  Blood sugars in the 160s to 250s during admission. Likely would benefit from better control in the outpatient setting.  #Conjunctivitis Right conjunctival erythema with purulent drainage noted on initial examination.  Patient placed on erythromycin eyedrops with subsequent improvement in symptoms.  #Hyperlipidemia: continued home pravastatin 20 mg daily and ASA 81 mg  Discharge subjective: Patient reporting improved strength and reduce confusion today.  Denies chest pain, shortness of breath, nausea or vomiting today.  Discussed safe disposition plan patient understands that her air conditioning at home needs to be fixed before she can go back.  She shows reluctance to go stay with her son's home but she was able to be  convinced.  Discharge Vitals:   BP 127/70 (BP Location: Right Arm)   Pulse 63   Temp 98.2 F (36.8 C) (Oral)   Resp 18   Ht '5\' 2"'  (1.575 m)   Wt 120.8 kg   SpO2 99%   BMI 48.71 kg/m   Discharge exam: General: Pleasant, morbidly obese woman laying in bed. No acute distress. CV: RRR. III/VI systolic murmur.  No rubs or gallops. Pulmonary: Normal respiratory effort.  Lungs clear to auscultation bilaterally Abdominal: Soft nontender nondistended. Extremities: 2+ distal pulses.  No lower extremity edema noted. Neuro: Alert and oriented x3.  Moves all extremities spontaneously. Psych: Normal mood and affect.  Pertinent Labs, Studies, and Procedures:     Latest Ref Rng & Units 05/07/2022    3:03 AM 05/06/2022    8:30 PM 05/06/2016    4:34 PM  CBC  WBC 4.0 - 10.5 K/uL 8.5  7.0  10.4   Hemoglobin 12.0 - 15.0 g/dL 12.8  13.5  13.8   Hematocrit 36.0 - 46.0 % 37.5  39.9  41.3   Platelets 150 - 400 K/uL 243  249  264        Latest Ref Rng & Units 05/08/2022  2:06 PM 05/07/2022    1:00 PM 05/07/2022    3:03 AM  CMP  Glucose 70 - 99 mg/dL 177  182  210   BUN 8 - 23 mg/dL '11  8  6   ' Creatinine 0.44 - 1.00 mg/dL 0.54  0.58  0.58   Sodium 135 - 145 mmol/L 134  136  131   Potassium 3.5 - 5.1 mmol/L 4.3  3.6  3.1   Chloride 98 - 111 mmol/L 100  98  96   CO2 22 - 32 mmol/L '24  28  26   ' Calcium 8.9 - 10.3 mg/dL 8.6  8.3  7.9    Initial mag 0.9, mag on hospital day one 1.6, mag on hospital day 2 1.7  A1c 9.3%  DG Chest Portable 1 View  Result Date: 05/06/2022 CLINICAL DATA:  Fever EXAM: PORTABLE CHEST 1 VIEW COMPARISON:  01/30/2016 FINDINGS: Marked cardiomegaly. No focal airspace disease or effusion. No visible pneumothorax. IMPRESSION: No active disease.  Cardiomegaly. Electronically Signed   By: Donavan Foil M.D.   On: 05/06/2022 23:01   CT ABDOMEN PELVIS W CONTRAST  Result Date: 05/06/2022 CLINICAL DATA:  Nausea and vomiting, abdominal pain, heat exposure EXAM: CT ABDOMEN AND PELVIS  WITH CONTRAST TECHNIQUE: Multidetector CT imaging of the abdomen and pelvis was performed using the standard protocol following bolus administration of intravenous contrast. RADIATION DOSE REDUCTION: This exam was performed according to the departmental dose-optimization program which includes automated exposure control, adjustment of the mA and/or kV according to patient size and/or use of iterative reconstruction technique. CONTRAST:  133m OMNIPAQUE IOHEXOL 300 MG/ML  SOLN COMPARISON:  11/20/2011 FINDINGS: Lower chest: Heart is enlarged without pericardial effusion. No acute pleural or parenchymal lung disease. Hepatobiliary: No focal liver abnormality is seen. No gallstones, gallbladder wall thickening, or biliary dilatation. Pancreas: Unremarkable. No pancreatic ductal dilatation or surrounding inflammatory changes. Spleen: Normal in size without focal abnormality. Adrenals/Urinary Tract: 1.5 cm simple appearing cyst lower pole right kidney does not require specific follow-up. Otherwise the kidneys enhance normally and symmetrically. No urinary tract calculi or obstructive uropathy. The adrenals are unremarkable. Small amount of gas within the bladder lumen could be due to recent catheterization. Stomach/Bowel: No bowel obstruction or ileus. Normal appendix right lower quadrant. No bowel wall thickening or inflammatory change. Vascular/Lymphatic: Aortic atherosclerosis. No enlarged abdominal or pelvic lymph nodes. Reproductive: Uterus is unremarkable. Simple appearing bilateral adnexal cysts, measuring 1.9 cm on the right and 3.1 cm on the left. Other: No free fluid or free intraperitoneal gas. No abdominal wall hernia. Musculoskeletal: No acute or destructive bony lesions. Chronic sclerotic foci within the bony pelvis and sacrum consistent with bone islands. 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 IMPRESSION: 1. No acute intra-abdominal or intrapelvic  process. 2. Multiple ovarian cysts. Most severe: 3.1 cm left adnexal simple-appearing cyst. Recommend follow-up pelvic UKoreain 6-12 months. Reference: JACR 2020 Feb;17(2):248-254 3.  Aortic Atherosclerosis (ICD10-I70.0). Electronically Signed   By: MRanda NgoM.D.   On: 05/06/2022 22:09     Discharge Instructions: Discharge Instructions     Call MD for:  difficulty breathing, headache or visual disturbances   Complete by: As directed    Call MD for:  persistant dizziness or light-headedness   Complete by: As directed    Call MD for:  persistant nausea and vomiting   Complete by: As directed    Call MD for:  temperature >100.4   Complete by: As directed  Diet - low sodium heart healthy   Complete by: As directed    Diet Carb Modified   Complete by: As directed    Discharge instructions   Complete by: As directed    If you feel weak, confused, or have nausea or vomiting after returning home please come back to our ED for further evaluation.  1.  Please take your blood sugars at home and your doctor will discuss changing your diabetes regimen with you if necessary at your follow-up appointment. 2.  Continue to take your antibiotic eyedrops as prescribed 3.  Please take your blood pressure at home if possible and show your doctor at your follow-up appointment  You were admitted for heat exhaustion and associated electrolyte abnormalities.  These quickly returned to normal with fluids and supportive measures.  Please make sure you are staying in a safe environment until your home condition is fixed.   Increase activity slowly   Complete by: As directed        Signed: Linus Galas, MD 05/08/2022, 4:27 PM   Pager: [phone]

## 2022-05-08 NOTE — Progress Notes (Signed)
DISCHARGE NOTE HOME Allison Caldwell to be discharged Home per MD order. Discussed prescriptions and follow up appointments with the patient. Prescriptions given to patient; medication list explained in detail. Patient verbalized understanding.  Skin clean, dry and intact without evidence of skin break down, no evidence of skin tears noted. IV catheter discontinued intact. Site without signs and symptoms of complications. Dressing and pressure applied. Pt denies pain at the site currently. No complaints noted.  Patient free of lines, drains, and wounds.   An After Visit Summary (AVS) was printed and given to the patient. Patient escorted via wheelchair, and discharged home via private auto.  Berneta Levins, RN

## 2022-05-09 ENCOUNTER — Other Ambulatory Visit: Payer: Self-pay

## 2022-05-09 ENCOUNTER — Telehealth: Payer: Self-pay

## 2022-05-09 DIAGNOSIS — E119 Type 2 diabetes mellitus without complications: Secondary | ICD-10-CM

## 2022-05-09 DIAGNOSIS — I152 Hypertension secondary to endocrine disorders: Secondary | ICD-10-CM

## 2022-05-09 LAB — URINE CULTURE: Culture: >=100000 — AB

## 2022-05-09 NOTE — Telephone Encounter (Signed)
   Telephone encounter was:  Unsuccessful.  05/09/2022 Name: Allison Caldwell MRN: [id number] DOB: Oct 16, 1946  Unsuccessful outbound call made today to assist with:  Financial Difficulties related to financial strain  Outreach Attempt:  1st Attempt  A HIPAA compliant voice message was left requesting a return call.  Instructed patient to call back at earliest convenience.     [place], Care Management  [phone] [address], [place], [place] [address] Phone: [phone] Email: [email or web address]

## 2022-05-10 NOTE — Progress Notes (Signed)
Urine cultures collected during hospital admission. Patient was asymptomatic, unclear why urine cultures done initially. Will continue to monitor and reach out to front desk staff for hospital follow up and reassess if having symptoms of UTI.

## 2022-05-11 ENCOUNTER — Telehealth: Payer: Self-pay

## 2022-05-11 LAB — CULTURE, BLOOD (ROUTINE X 2)
Culture: NO GROWTH
Culture: NO GROWTH
Special Requests: ADEQUATE
Special Requests: ADEQUATE

## 2022-05-11 NOTE — Telephone Encounter (Signed)
   Telephone encounter was:  Unsuccessful.  05/11/2022 Name: Allison Caldwell MRN: [id number] DOB: 02-Mar-1946  Unsuccessful outbound call made today to assist with:  Financial Difficulties related to financial strain  Outreach Attempt:  2nd Attempt  A HIPAA compliant voice message was left requesting a return call.  Instructed patient to call back at earliest convenience. [place], Care Management  [phone] [address], [place], [place] [address] Phone: [phone] Email: [email or web address]

## 2022-05-13 ENCOUNTER — Telehealth: Payer: Self-pay

## 2022-05-13 NOTE — Telephone Encounter (Signed)
   Telephone encounter was:  Unsuccessful.  05/13/2022 Name: Allison Caldwell MRN: [id number] DOB: [birth year]  Unsuccessful outbound call made today to assist with:  Financial Difficulties related to financial strain  Outreach Attempt:  3rd Attempt.  Referral closed unable to contact patient.  A HIPAA compliant voice message was left requesting a return call.  Instructed patient to call back at earliest convenience.  [facility], Care Management  [phone] [address], [place],  [address] Phone: [phone] Email: [email or web address]

## 2022-05-23 ENCOUNTER — Encounter: Payer: Medicare HMO | Admitting: Student

## 2022-05-26 ENCOUNTER — Encounter: Payer: Medicare HMO | Admitting: Student

## 2022-06-20 ENCOUNTER — Encounter: Payer: Medicare HMO | Admitting: Internal Medicine

## 2022-06-20 ENCOUNTER — Ambulatory Visit: Payer: Medicare HMO

## 2022-06-22 ENCOUNTER — Other Ambulatory Visit: Payer: Self-pay | Admitting: Student

## 2022-06-22 ENCOUNTER — Encounter: Payer: Self-pay | Admitting: Student

## 2022-06-22 DIAGNOSIS — I10 Essential (primary) hypertension: Secondary | ICD-10-CM

## 2022-06-22 NOTE — Telephone Encounter (Signed)
Called pt to schedule an appt - no answer; left message to call the office to schedule an appt.

## 2022-07-26 ENCOUNTER — Other Ambulatory Visit: Payer: Self-pay | Admitting: Student

## 2022-07-26 DIAGNOSIS — F341 Dysthymic disorder: Secondary | ICD-10-CM

## 2022-07-28 NOTE — Telephone Encounter (Signed)
Can we please get her scheduled for an appointment? Thank you

## 2022-09-05 ENCOUNTER — Encounter: Payer: Medicare HMO | Admitting: Student

## 2022-09-22 ENCOUNTER — Emergency Department (HOSPITAL_COMMUNITY): Payer: Medicare HMO

## 2022-09-22 ENCOUNTER — Other Ambulatory Visit: Payer: Self-pay

## 2022-09-22 ENCOUNTER — Observation Stay (HOSPITAL_COMMUNITY)
Admission: EM | Admit: 2022-09-22 | Discharge: 2022-09-26 | Disposition: A | Discharge status: Home / Self Care | Payer: Medicare HMO | Attending: Infectious Diseases | Admitting: Infectious Diseases

## 2022-09-22 ENCOUNTER — Encounter (HOSPITAL_COMMUNITY): Payer: Self-pay

## 2022-09-22 DIAGNOSIS — I110 Hypertensive heart disease with heart failure: Secondary | ICD-10-CM | POA: Diagnosis not present

## 2022-09-22 DIAGNOSIS — I959 Hypotension, unspecified: Secondary | ICD-10-CM | POA: Diagnosis not present

## 2022-09-22 DIAGNOSIS — Z87891 Personal history of nicotine dependence: Secondary | ICD-10-CM | POA: Diagnosis not present

## 2022-09-22 DIAGNOSIS — J9811 Atelectasis: Secondary | ICD-10-CM | POA: Diagnosis not present

## 2022-09-22 DIAGNOSIS — E876 Hypokalemia: Secondary | ICD-10-CM | POA: Diagnosis not present

## 2022-09-22 DIAGNOSIS — I272 Pulmonary hypertension, unspecified: Secondary | ICD-10-CM | POA: Diagnosis not present

## 2022-09-22 DIAGNOSIS — R0902 Hypoxemia: Secondary | ICD-10-CM

## 2022-09-22 DIAGNOSIS — R062 Wheezing: Secondary | ICD-10-CM | POA: Insufficient documentation

## 2022-09-22 DIAGNOSIS — J9601 Acute respiratory failure with hypoxia: Secondary | ICD-10-CM | POA: Diagnosis present

## 2022-09-22 DIAGNOSIS — Z79899 Other long term (current) drug therapy: Secondary | ICD-10-CM | POA: Diagnosis not present

## 2022-09-22 DIAGNOSIS — R0602 Shortness of breath: Secondary | ICD-10-CM | POA: Insufficient documentation

## 2022-09-22 DIAGNOSIS — T68XXXA Hypothermia, initial encounter: Secondary | ICD-10-CM | POA: Diagnosis not present

## 2022-09-22 DIAGNOSIS — J205 Acute bronchitis due to respiratory syncytial virus: Principal | ICD-10-CM | POA: Diagnosis present

## 2022-09-22 DIAGNOSIS — E118 Type 2 diabetes mellitus with unspecified complications: Secondary | ICD-10-CM | POA: Insufficient documentation

## 2022-09-22 DIAGNOSIS — Z7984 Long term (current) use of oral hypoglycemic drugs: Secondary | ICD-10-CM | POA: Diagnosis not present

## 2022-09-22 DIAGNOSIS — R2689 Other abnormalities of gait and mobility: Secondary | ICD-10-CM | POA: Insufficient documentation

## 2022-09-22 DIAGNOSIS — Z1152 Encounter for screening for COVID-19: Secondary | ICD-10-CM | POA: Insufficient documentation

## 2022-09-22 DIAGNOSIS — E119 Type 2 diabetes mellitus without complications: Secondary | ICD-10-CM | POA: Insufficient documentation

## 2022-09-22 DIAGNOSIS — Z7982 Long term (current) use of aspirin: Secondary | ICD-10-CM | POA: Diagnosis not present

## 2022-09-22 DIAGNOSIS — I509 Heart failure, unspecified: Secondary | ICD-10-CM | POA: Diagnosis not present

## 2022-09-22 DIAGNOSIS — R059 Cough, unspecified: Secondary | ICD-10-CM | POA: Diagnosis present

## 2022-09-22 DIAGNOSIS — R0689 Other abnormalities of breathing: Secondary | ICD-10-CM | POA: Diagnosis not present

## 2022-09-22 LAB — RESP PANEL BY RT-PCR (FLU A&B, COVID) ARPGX2
Influenza A by PCR: NEGATIVE
Influenza B by PCR: NEGATIVE
SARS Coronavirus 2 by RT PCR: NEGATIVE

## 2022-09-22 LAB — CBC WITH DIFFERENTIAL/PLATELET
Abs Immature Granulocytes: 0.03 10*3/uL (ref 0.00–0.07)
Basophils Absolute: 0 10*3/uL (ref 0.0–0.1)
Basophils Relative: 0 %
Eosinophils Absolute: 0.1 10*3/uL (ref 0.0–0.5)
Eosinophils Relative: 1 %
HCT: 42.8 % (ref 36.0–46.0)
Hemoglobin: 13.8 g/dL (ref 12.0–15.0)
Immature Granulocytes: 0 %
Lymphocytes Relative: 34 %
Lymphs Abs: 2.4 10*3/uL (ref 0.7–4.0)
MCH: 30.5 pg (ref 26.0–34.0)
MCHC: 32.2 g/dL (ref 30.0–36.0)
MCV: 94.5 fL (ref 80.0–100.0)
Monocytes Absolute: 1 10*3/uL (ref 0.1–1.0)
Monocytes Relative: 14 %
Neutro Abs: 3.5 10*3/uL (ref 1.7–7.7)
Neutrophils Relative %: 51 %
Platelets: 157 10*3/uL (ref 150–400)
RBC: 4.53 MIL/uL (ref 3.87–5.11)
RDW: 15.2 % (ref 11.5–15.5)
WBC: 7.1 10*3/uL (ref 4.0–10.5)
nRBC: 0 % (ref 0.0–0.2)

## 2022-09-22 LAB — BASIC METABOLIC PANEL
Anion gap: 19 — ABNORMAL HIGH (ref 5–15)
BUN: 5 mg/dL — ABNORMAL LOW (ref 8–23)
CO2: 25 mmol/L (ref 22–32)
Calcium: 8.5 mg/dL — ABNORMAL LOW (ref 8.9–10.3)
Chloride: 91 mmol/L — ABNORMAL LOW (ref 98–111)
Creatinine, Ser: 0.86 mg/dL (ref 0.44–1.00)
GFR, Estimated: >60 mL/min (ref >60)
Glucose, Bld: 247 mg/dL — ABNORMAL HIGH (ref 70–99)
Potassium: 3 mmol/L — ABNORMAL LOW (ref 3.5–5.1)
Sodium: 135 mmol/L (ref 135–145)

## 2022-09-22 LAB — I-STAT CHEM 8, ED
BUN: 5 mg/dL — ABNORMAL LOW (ref 8–23)
Calcium, Ion: 0.95 mmol/L — ABNORMAL LOW (ref 1.15–1.40)
Chloride: 90 mmol/L — ABNORMAL LOW (ref 98–111)
Creatinine, Ser: 0.6 mg/dL (ref 0.44–1.00)
Glucose, Bld: 264 mg/dL — ABNORMAL HIGH (ref 70–99)
HCT: 44 % (ref 36.0–46.0)
Hemoglobin: 15 g/dL (ref 12.0–15.0)
Potassium: 3.8 mmol/L (ref 3.5–5.1)
Sodium: 131 mmol/L — ABNORMAL LOW (ref 135–145)
TCO2: 27 mmol/L (ref 22–32)

## 2022-09-22 LAB — BRAIN NATRIURETIC PEPTIDE: B Natriuretic Peptide: 372.1 pg/mL — ABNORMAL HIGH (ref 0.0–100.0)

## 2022-09-22 MED ORDER — FUROSEMIDE 10 MG/ML IJ SOLN
20.0000 mg | Freq: Once | INTRAMUSCULAR | Status: AC
  Administered 2022-09-22: 20 mg via INTRAVENOUS
  Filled 2022-09-22: qty 2

## 2022-09-22 MED ORDER — ALBUTEROL SULFATE (2.5 MG/3ML) 0.083% IN NEBU
5.0000 mg/h | INHALATION_SOLUTION | Freq: Once | RESPIRATORY_TRACT | Status: AC
  Administered 2022-09-22: 5 mg/h via RESPIRATORY_TRACT
  Filled 2022-09-22: qty 3
  Filled 2022-09-22: qty 6

## 2022-09-22 MED ORDER — POTASSIUM CHLORIDE CRYS ER 20 MEQ PO TBCR
40.0000 meq | EXTENDED_RELEASE_TABLET | Freq: Once | ORAL | Status: AC
  Administered 2022-09-22: 40 meq via ORAL
  Filled 2022-09-22: qty 2

## 2022-09-22 MED ORDER — MAGNESIUM SULFATE 2 GM/50ML IV SOLN
2.0000 g | Freq: Once | INTRAVENOUS | Status: AC
  Administered 2022-09-22: 2 g via INTRAVENOUS
  Filled 2022-09-22: qty 50

## 2022-09-22 MED ORDER — IOHEXOL 350 MG/ML SOLN
75.0000 mL | Freq: Once | INTRAVENOUS | Status: AC | PRN
  Administered 2022-09-22: 75 mL via INTRAVENOUS

## 2022-09-22 MED ORDER — IPRATROPIUM-ALBUTEROL 0.5-2.5 (3) MG/3ML IN SOLN
3.0000 mL | Freq: Once | RESPIRATORY_TRACT | Status: AC
  Administered 2022-09-22: 3 mL via RESPIRATORY_TRACT
  Filled 2022-09-22: qty 3

## 2022-09-22 NOTE — ED Provider Notes (Signed)
[facility] EMERGENCY DEPARTMENT Provider Note   CSN: [id number] Arrival date & time: 09/22/22  1922     History  Chief Complaint  Patient presents with   Shortness of Allison Caldwell is a 76 y.o. female.  76 year old female with past medical history of diabetes, hypertension, TIA presents with cough and difficulty breathing.  States has been coughing for the past few days, cough is productive with white frothy sputum.  Cough became worse today prompting her to call EMS.  On EMS arrival, EMS notes O2 sat 88% on room air.  Patient was given a nebulizer treatment and placed on 15 L nonrebreather and arrives in the department with O2 sat of 100% on supplemental O2.  She denies fevers, chills, body aches, changes in bowel or bladder habits, nausea or vomiting.  No lower extremity edema, no chest pain.  Patient is a former smoker, no history of asthma or COPD.       Home Medications Prior to Admission medications   Medication Sig Start Date End Date Taking? Authorizing Provider  Accu-Chek FastClix Lancets MISC Check blood sugar 1 time a day 09/28/20   Riesa Pope, MD  aspirin EC 81 MG tablet Take 1 tablet (81 mg total) by mouth every morning. 05/05/16   Funches, Adriana Mccallum, MD  Blood Glucose Monitoring Suppl (ACCU-CHEK GUIDE) w/Device KIT 1 each by Does not apply route daily. Check blood sugar 1 time a day 01/31/19   Ina Homes, MD  busPIRone (BUSPAR) 10 MG tablet Take 1 tablet (10 mg total) by mouth 3 (three) times daily. Patient not taking: Reported on 05/07/2022 08/08/19   Ina Homes, MD  erythromycin ophthalmic ointment Place into the right eye every 8 (eight) hours. 05/08/22   Linus Galas, MD  glucose blood (ACCU-CHEK GUIDE) test strip Check blood sugar 1 time per day 09/28/20   Riesa Pope, MD  hydrochlorothiazide (HYDRODIURIL) 25 MG tablet TAKE 1 TABLET EVERY DAY Patient taking differently: Take 25 mg by mouth daily.  01/06/22   Riesa Pope, MD  metFORMIN (GLUCOPHAGE) 500 MG tablet Take 1 tablet (500 mg total) by mouth 2 (two) times daily with a meal. 07/28/22   Katsadouros, Vasilios, MD  Multiple Vitamin (MULTIVITAMIN WITH MINERALS) TABS tablet Take 1 tablet by mouth daily. 05/05/16   Funches, Adriana Mccallum, MD  olmesartan (BENICAR) 40 MG tablet TAKE 1 TABLET AT BEDTIME 06/23/22   Katsadouros, Vasilios, MD  pravastatin (PRAVACHOL) 20 MG tablet TAKE 1 TABLET AT BEDTIME Patient taking differently: Take 20 mg by mouth daily. 03/09/22   Katsadouros, Vasilios, MD  sertraline (ZOLOFT) 100 MG tablet Take 1 tablet (100 mg total) by mouth daily. 07/28/22   Katsadouros, Vasilios, MD  TIADYLT ER 420 MG 24 hr capsule TAKE 1 CAPSULE EVERY DAY Patient taking differently: Take 420 mg by mouth daily. 01/06/22   Riesa Pope, MD      Allergies    Ace inhibitors and Penicillins    Review of Systems   Review of Systems Negative except as per HPI Physical Exam Updated Vital Signs BP 123/61   Pulse 72   Temp 98.7 F (37.1 C) (Rectal)   Resp 18   Ht _0  (1.626 m)   Wt 113.4 kg   SpO2 96%   BMI 42.91 kg/m  Physical Exam Vitals and nursing note reviewed.  Constitutional:      General: She is not in acute distress.    Appearance: She is well-developed. She is not diaphoretic.  HENT:     Head: Normocephalic and atraumatic.  Cardiovascular:     Rate and Rhythm: Normal rate and regular rhythm.  Pulmonary:     Effort: Pulmonary effort is normal.     Breath sounds: Wheezing present.  Abdominal:     Palpations: Abdomen is soft.     Tenderness: There is no abdominal tenderness.  Musculoskeletal:     Cervical back: Neck supple.     Right lower leg: No tenderness. No edema.     Left lower leg: No tenderness. No edema.  Skin:    General: Skin is warm and dry.  Neurological:     Mental Status: She is alert and oriented to person, place, and time.  Psychiatric:        Behavior: Behavior normal.     ED  Results / Procedures / Treatments   Labs (all labs ordered are listed, but only abnormal results are displayed) Labs Reviewed  BASIC METABOLIC PANEL - Abnormal; Notable for the following components:      Result Value   Potassium 3.0 (*)    Chloride 91 (*)    Glucose, Bld 247 (*)    BUN 5 (*)    Calcium 8.5 (*)    Anion gap 19 (*)    All other components within normal limits  BRAIN NATRIURETIC PEPTIDE - Abnormal; Notable for the following components:   B Natriuretic Peptide 372.1 (*)    All other components within normal limits  I-STAT CHEM 8, ED - Abnormal; Notable for the following components:   Sodium 131 (*)    Chloride 90 (*)    BUN 5 (*)    Glucose, Bld 264 (*)    Calcium, Ion 0.95 (*)    All other components within normal limits  RESP PANEL BY RT-PCR (FLU A&B, COVID) ARPGX2  CBC WITH DIFFERENTIAL/PLATELET    EKG EKG Interpretation  Date/Time:  Thursday September 22 2022 21:43:50 EST Ventricular Rate:  63 PR Interval:  197 QRS Duration: 113 QT Interval:  522 QTC Calculation: 535 R Axis:   -84 Text Interpretation: Sinus rhythm Left anterior fascicular block Abnormal R-wave progression, late transition Abnormal T, consider ischemia, diffuse leads Prolonged QT interval When compared with ECG of EARLIER SAME DATE No significant change was found Confirmed by Delora Fuel ([id number]) on 09/22/2022 11:08:20 PM  Radiology CT Angio Chest PE W/Cm &/Or Wo Cm  Result Date: 09/22/2022 CLINICAL DATA:  Pulmonary embolism (PE) suspected, high prob. Shortness of breath EXAM: CT ANGIOGRAPHY CHEST WITH CONTRAST TECHNIQUE: Multidetector CT imaging of the chest was performed using the standard protocol during bolus administration of intravenous contrast. Multiplanar CT image reconstructions and MIPs were obtained to evaluate the vascular anatomy. RADIATION DOSE REDUCTION: This exam was performed according to the departmental dose-optimization program which includes automated exposure control,  adjustment of the mA and/or kV according to patient size and/or use of iterative reconstruction technique. CONTRAST:  20m OMNIPAQUE IOHEXOL 350 MG/ML SOLN COMPARISON:  None Available. FINDINGS: Cardiovascular: Markedly dilated main pulmonary artery measuring up to 6.6 cm compatible with pulmonary arterial hypertension. Cardiomegaly. No filling defects in the pulmonary arteries to suggest pulmonary emboli. Aorta normal caliber with scattered calcifications. Mediastinum/Nodes: No mediastinal, hilar, or axillary adenopathy. Trachea and esophagus are unremarkable. Thyroid unremarkable. Lungs/Pleura: Linear areas of atelectasis or scarring in the left lower lobe and right mid lung. No effusions. Upper Abdomen: No acute findings Musculoskeletal: Chest wall soft tissues are unremarkable. No acute bony abnormality. Review of the MIP images confirms  the above findings. IMPRESSION: No evidence of pulmonary embolus. Markedly dilated main pulmonary artery most compatible with pulmonary arterial hypertension. No acute cardiopulmonary disease. Aortic Atherosclerosis (ICD10-I70.0). Electronically Signed   By: Rolm Baptise M.D.   On: 09/22/2022 22:54   DG Chest Port 1 View  Result Date: 09/22/2022 CLINICAL DATA:  Shortness of breath. EXAM: PORTABLE CHEST 1 VIEW COMPARISON:  05/06/2022. FINDINGS: Heart is enlarged and the mediastinal contour stable. There is atherosclerotic calcification of the aorta. Minimal subsegmental atelectasis in the mid left lung. No consolidation, effusion, or pneumothorax. No acute osseous abnormality. IMPRESSION: Cardiomegaly with no active disease. Electronically Signed   By: Brett Fairy M.D.   On: 09/22/2022 20:06    Procedures .Critical Care  Performed by: Tacy Learn, PA-C Authorized by: Tacy Learn, PA-C   Critical care provider statement:    Critical care time (minutes):  30   Critical care was time spent personally by me on the following activities:  Development of  treatment plan with patient or surrogate, discussions with consultants, evaluation of patient's response to treatment, examination of patient, ordering and review of laboratory studies, ordering and review of radiographic studies, ordering and performing treatments and interventions, pulse oximetry, re-evaluation of patient's condition and review of old charts     Medications Ordered in ED Medications  furosemide (LASIX) injection 20 mg (has no administration in time range)  ipratropium-albuterol (DUONEB) 0.5-2.5 (3) MG/3ML nebulizer solution 3 mL (3 mLs Nebulization Given 09/22/22 1951)  potassium chloride SA (KLOR-CON M) CR tablet 40 mEq (40 mEq Oral Given 09/22/22 2127)  magnesium sulfate IVPB 2 g 50 mL (0 g Intravenous Stopped 09/22/22 2228)  albuterol (PROVENTIL) (2.5 MG/3ML) 0.083% nebulizer solution (5 mg/hr Nebulization Given 09/22/22 2159)  iohexol (OMNIPAQUE) 350 MG/ML injection 75 mL (75 mLs Intravenous Contrast Given 09/22/22 2248)    ED Course/ Medical Decision Making/ A&P       CHA2DS2-VASc Score: 8                    Medical Decision Making Amount and/or Complexity of Data Reviewed Labs: ordered. Radiology: ordered.  Risk Prescription drug management.   This patient presents to the ED for concern of wheezing, shortness of breath, hypoxia, this involves an extensive number of treatment options, and is a complaint that carries with it a high risk of complications and morbidity.  The differential diagnosis includes but not limited to COPD, CHF, COVID, Flu, viral URI   Co morbidities that complicate the patient evaluation  Diabetes, hypertension, TIA   Additional history obtained:  Additional history obtained from EMS who notes O2 sat 88% on room air, given neb treatment as well as supplemental O2 via 15 L nonrebreather External records from outside source obtained and reviewed including echo dated 05/08/2022 with EF of 50 to 55% Discharge summary dated 05/08/2022 where  patient was admitted for heat exhaustion   Lab Tests:  I Ordered, and personally interpreted labs.  The pertinent results include:  CBC unremarkable, BMP with K 3.0, glucose 247, anion gap 19 with normal CO2. Covid, flu negative.    Imaging Studies ordered:  I ordered imaging studies including CXR, CT PE study   I independently visualized and interpreted imaging which showed cardiomegaly  I agree with the radiologist interpretation CT PE study pending   Cardiac Monitoring: / EKG:  The patient was maintained on a cardiac monitor.  I personally viewed and interpreted the cardiac monitored which showed an underlying rhythm of: sinus  rhythm, rate 75 Repeat EKG concerning for a fib vs flutter, rate in the 63   Consultations Obtained:  I requested consultation with the internal medicine service,  and discussed lab and imaging findings as well as pertinent plan - they recommend: Will consult for admission   Problem List / ED Course / Critical interventions / Medication management  76 year old female brought in by EMS with complaint of cough for the past 3 days, progressively worsening until she could not tolerate anymore today and called EMS.  She was found hypoxic with EMS with an O2 sat of 88% on room air.  Was provided with a DuoNeb as well as nonrebreather at 15 L and presented to the emergency room with O2 sat 100% on her supplemental O2.  She had significant wheezing throughout.  She denied any associated symptoms.  She does not have any lower extremity edema.  Is not having chest pain.  After DuoNeb, patient continues to have significant wheezing, she continues to feel to maintain O2 sats in the upper 90s on 2 L nasal cannula.  She is provided with continuous neb and magnesium for her wheezing and cough.  EKG repeat concerning for atrial flutter, new diagnosis, not anticoagulated.  Plan is to CT chest with concern for possible PE with plan to admit after CT. CT negative for PE, is  concerning for pulmonary arterial hypertension. Discussed with ER attending Dr. Zenia Resides, patient given Lasix 30m for possible CHF exacerbation. Admit for further work up.  I ordered medication including kdur for hypokalemia, duoneb, continuous neb with magnesium for bronchospasm/wheezing, cough    Reevaluation of the patient after these medicines showed that the patient stayed the same I have reviewed the patients home medicines and have made adjustments as needed   Social Determinants of Health:  PCP IM resident clinic, states her doctor left in July and she is waiting for her next appointment. States she is not out of any of her medications and is compliant with her meds.  Is prescribed diltiazem which she states that she takes because she was told at 1 point she had an irregular heartbeat.   Test / Admission - Considered:  Plan is to admit after CT PE study.         Final Clinical Impression(s) / ED Diagnoses Final diagnoses:  SOB (shortness of breath)  Wheezing  Hypokalemia  Acute congestive heart failure, unspecified heart failure type ([place]  Hypoxia    Rx / DC Orders ED Discharge Orders     None         MRoque Lias11/23/23 2324    ALacretia Leigh MD 09/23/22 [phone]

## 2022-09-22 NOTE — ED Triage Notes (Signed)
Pt BIB EMS. Per EMS, pt has had increasing SOB over the last several days. Pt's family has been dealing with respiratory symptoms over the course of the week. No diagnoses from other family members. Pt RA O2 sats at 88%. Pt placed on non re-breather at 15L Pt refused BiPap. Currently A/O x 4

## 2022-09-22 NOTE — ED Notes (Signed)
This RN was informed that the pt's daughter called for an update. Attempted to call pt's daughter with phone number provided. No answer at this time.

## 2022-09-22 NOTE — ED Provider Notes (Signed)
I provided a substantive portion of the care of this patient.  I personally performed the entirety of the medical decision making for this encounter.  EKG Interpretation  Date/Time:  Thursday September 22 2022 19:31:21 EST Ventricular Rate:  75 PR Interval:  197 QRS Duration: 113 QT Interval:  470 QTC Calculation: 525 R Axis:   -70 Text Interpretation: Sinus rhythm Left anterior fascicular block Abnormal R-wave progression, late transition LVH w/ repol abnormalities, possible ischemia Prolonged QT interval No significant change since last tracing Confirmed by Lacretia Leigh ([id number]) on 09/22/2022 9:05:28 PM   Patient's EKG per interpretation shows normal sinus rhythm.  Patient presented with increased shortness of breath times several days.  Positive sick exposures.  Concern for possible COVID.  Chest x-ray shows cardiomegaly per interpretation.  Patient's BNP is slightly elevated.  Will order chest CT to rule out PE.  She will require admission   Lacretia Leigh, MD 09/22/22 2139

## 2022-09-22 NOTE — ED Notes (Signed)
Patient transported to CT 

## 2022-09-22 NOTE — ED Notes (Signed)
Pt placed on 2L [place] at this time. 

## 2022-09-23 DIAGNOSIS — I272 Pulmonary hypertension, unspecified: Secondary | ICD-10-CM | POA: Diagnosis present

## 2022-09-23 DIAGNOSIS — J9601 Acute respiratory failure with hypoxia: Secondary | ICD-10-CM

## 2022-09-23 DIAGNOSIS — J205 Acute bronchitis due to respiratory syncytial virus: Principal | ICD-10-CM

## 2022-09-23 LAB — GLUCOSE, CAPILLARY
Glucose-Capillary: 329 mg/dL — ABNORMAL HIGH (ref 70–99)
Glucose-Capillary: 131 mg/dL — ABNORMAL HIGH (ref 70–99)
Glucose-Capillary: 101 mg/dL — ABNORMAL HIGH (ref 70–99)

## 2022-09-23 LAB — BASIC METABOLIC PANEL
Anion gap: 17 — ABNORMAL HIGH (ref 5–15)
BUN: <5 mg/dL — ABNORMAL LOW (ref 8–23)
CO2: 28 mmol/L (ref 22–32)
Calcium: 8.7 mg/dL — ABNORMAL LOW (ref 8.9–10.3)
Chloride: 93 mmol/L — ABNORMAL LOW (ref 98–111)
Creatinine, Ser: 0.67 mg/dL (ref 0.44–1.00)
GFR, Estimated: >60 mL/min (ref >60)
Glucose, Bld: 208 mg/dL — ABNORMAL HIGH (ref 70–99)
Potassium: 3.2 mmol/L — ABNORMAL LOW (ref 3.5–5.1)
Sodium: 138 mmol/L (ref 135–145)

## 2022-09-23 LAB — RESPIRATORY PANEL BY PCR

## 2022-09-23 LAB — CBC
HCT: 40 % (ref 36.0–46.0)
Hemoglobin: 13.2 g/dL (ref 12.0–15.0)
MCH: 30.8 pg (ref 26.0–34.0)
MCHC: 33 g/dL (ref 30.0–36.0)
MCV: 93.5 fL (ref 80.0–100.0)
Platelets: 136 10*3/uL — ABNORMAL LOW (ref 150–400)
RBC: 4.28 MIL/uL (ref 3.87–5.11)
RDW: 15.6 % — ABNORMAL HIGH (ref 11.5–15.5)
WBC: 6.4 10*3/uL (ref 4.0–10.5)
nRBC: 0 % (ref 0.0–0.2)

## 2022-09-23 LAB — MAGNESIUM: Magnesium: 1.4 mg/dL — ABNORMAL LOW (ref 1.7–2.4)

## 2022-09-23 LAB — CBG MONITORING, ED: Glucose-Capillary: 274 mg/dL — ABNORMAL HIGH (ref 70–99)

## 2022-09-23 MED ORDER — HYDROCHLOROTHIAZIDE 25 MG PO TABS: 25.0000 mg | ORAL_TABLET | Freq: Every day | ORAL | Status: DC

## 2022-09-23 MED ORDER — INSULIN ASPART 100 UNIT/ML IJ SOLN: 0.0000 [IU] | Freq: Every day | INTRAMUSCULAR | Status: DC

## 2022-09-23 MED ORDER — MAGNESIUM SULFATE 2 GM/50ML IV SOLN
2.0000 g | Freq: Once | INTRAVENOUS | Status: AC
  Administered 2022-09-23: 2 g via INTRAVENOUS
  Filled 2022-09-23: qty 50

## 2022-09-23 MED ORDER — AZITHROMYCIN 250 MG PO TABS
500.0000 mg | ORAL_TABLET | Freq: Once | ORAL | Status: AC
  Administered 2022-09-23: 500 mg via ORAL
  Filled 2022-09-23: qty 2

## 2022-09-23 MED ORDER — PREDNISONE 20 MG PO TABS
40.0000 mg | ORAL_TABLET | Freq: Every day | ORAL | Status: DC
  Administered 2022-09-23 – 2022-09-26 (×4): 40 mg via ORAL
  Filled 2022-09-23 (×4): qty 2

## 2022-09-23 MED ORDER — BUSPIRONE HCL 10 MG PO TABS
10.0000 mg | ORAL_TABLET | Freq: Three times a day (TID) | ORAL | Status: DC
  Administered 2022-09-23 – 2022-09-26 (×10): 10 mg via ORAL
  Filled 2022-09-23 (×10): qty 1

## 2022-09-23 MED ORDER — INSULIN ASPART 100 UNIT/ML IJ SOLN
0.0000 [IU] | Freq: Three times a day (TID) | INTRAMUSCULAR | Status: DC
  Administered 2022-09-23: 15 [IU] via SUBCUTANEOUS
  Administered 2022-09-23: 11 [IU] via SUBCUTANEOUS
  Administered 2022-09-23: 3 [IU] via SUBCUTANEOUS
  Administered 2022-09-24: 20 [IU] via SUBCUTANEOUS
  Administered 2022-09-24 (×2): 7 [IU] via SUBCUTANEOUS
  Administered 2022-09-25: 11 [IU] via SUBCUTANEOUS
  Administered 2022-09-25: 20 [IU] via SUBCUTANEOUS
  Administered 2022-09-26: 4 [IU] via SUBCUTANEOUS
  Administered 2022-09-26: 7 [IU] via SUBCUTANEOUS

## 2022-09-23 MED ORDER — POTASSIUM CHLORIDE CRYS ER 20 MEQ PO TBCR
40.0000 meq | EXTENDED_RELEASE_TABLET | Freq: Once | ORAL | Status: AC
  Administered 2022-09-23: 40 meq via ORAL
  Filled 2022-09-23: qty 2

## 2022-09-23 MED ORDER — REVEFENACIN 175 MCG/3ML IN SOLN
175.0000 ug | Freq: Every day | RESPIRATORY_TRACT | Status: DC
  Administered 2022-09-23 – 2022-09-26 (×4): 175 ug via RESPIRATORY_TRACT
  Filled 2022-09-23 (×5): qty 3

## 2022-09-23 MED ORDER — DILTIAZEM HCL ER COATED BEADS 300 MG PO CP24
420.0000 mg | ORAL_CAPSULE | Freq: Every day | ORAL | Status: DC
  Administered 2022-09-23 – 2022-09-26 (×4): 420 mg via ORAL
  Filled 2022-09-23 (×4): qty 1

## 2022-09-23 MED ORDER — IPRATROPIUM-ALBUTEROL 0.5-2.5 (3) MG/3ML IN SOLN
3.0000 mL | Freq: Three times a day (TID) | RESPIRATORY_TRACT | Status: DC
  Administered 2022-09-23 – 2022-09-25 (×5): 3 mL via RESPIRATORY_TRACT
  Filled 2022-09-23 (×5): qty 3

## 2022-09-23 MED ORDER — IRBESARTAN 300 MG PO TABS
300.0000 mg | ORAL_TABLET | Freq: Every day | ORAL | Status: DC
  Administered 2022-09-23 – 2022-09-26 (×4): 300 mg via ORAL
  Filled 2022-09-23 (×4): qty 1

## 2022-09-23 MED ORDER — SERTRALINE HCL 100 MG PO TABS
100.0000 mg | ORAL_TABLET | Freq: Every day | ORAL | Status: DC
  Administered 2022-09-23 – 2022-09-26 (×4): 100 mg via ORAL
  Filled 2022-09-23 (×4): qty 1

## 2022-09-23 MED ORDER — ARFORMOTEROL TARTRATE 15 MCG/2ML IN NEBU
15.0000 ug | INHALATION_SOLUTION | Freq: Two times a day (BID) | RESPIRATORY_TRACT | Status: DC
  Administered 2022-09-23 – 2022-09-26 (×8): 15 ug via RESPIRATORY_TRACT
  Filled 2022-09-23 (×8): qty 2

## 2022-09-23 MED ORDER — IPRATROPIUM-ALBUTEROL 0.5-2.5 (3) MG/3ML IN SOLN
3.0000 mL | RESPIRATORY_TRACT | Status: DC
  Administered 2022-09-23 (×3): 3 mL via RESPIRATORY_TRACT
  Filled 2022-09-23 (×4): qty 3

## 2022-09-23 MED ORDER — ENOXAPARIN SODIUM 40 MG/0.4ML IJ SOSY
40.0000 mg | PREFILLED_SYRINGE | INTRAMUSCULAR | Status: DC
  Administered 2022-09-23 – 2022-09-25 (×3): 40 mg via SUBCUTANEOUS
  Filled 2022-09-23 (×3): qty 0.4

## 2022-09-23 MED ORDER — GUAIFENESIN 100 MG/5ML PO LIQD
5.0000 mL | ORAL | Status: DC | PRN
  Administered 2022-09-23 – 2022-09-24 (×3): 5 mL via ORAL
  Filled 2022-09-23 (×3): qty 15

## 2022-09-23 MED ORDER — PRAVASTATIN SODIUM 40 MG PO TABS
20.0000 mg | ORAL_TABLET | Freq: Every day | ORAL | Status: DC
  Administered 2022-09-23 – 2022-09-25 (×3): 20 mg via ORAL
  Filled 2022-09-23 (×3): qty 1

## 2022-09-23 NOTE — Evaluation (Signed)
Physical Therapy Evaluation/ Discharge Patient Details Name: Mariyah Upshaw MRN: [id number] DOB: 11-Nov-1945 Today's Date: 09/23/2022  History of Present Illness  76 yo female admitted 11/23 with acute hypoxic respiratory failure due to RSV. PMhx: T2DM, obesity, HTN, HLD  Clinical Impression  Pt pleasant and reports she lives at home alone and uses cane outside at times to be able to get up the steps into her apartment. Pt on 3L on arrival with SPO2 100% but able to wean to RA with SPO2 90-95% throughout activity including hall ambulation. Pt reports difficulty with being able to perform pericare and that she fatigue with gait. Pt would benefit from rollator for community mobility to progress ambulation distance and activity tolerance. No further acute therapy needs will sign off with pt aware and agreeable.        Recommendations for follow up therapy are one component of a multi-disciplinary discharge planning process, led by the attending physician.  Recommendations may be updated based on patient status, additional functional criteria and insurance authorization.  Follow Up Recommendations No PT follow up      Assistance Recommended at Discharge PRN  Patient can return home with the following  Help with stairs or ramp for entrance    Equipment Recommendations Rollator (4 wheels)  Recommendations for Other Services       Functional Status Assessment Patient has not had a recent decline in their functional status     Precautions / Restrictions Precautions Precautions: Other (comment) Precaution Comments: watch sats      Mobility  Bed Mobility Overal bed mobility: Modified Independent                  Transfers Overall transfer level: Modified independent                 General transfer comment: pt able to stand up from bed, toilet, chair without assist    Ambulation/Gait Ambulation/Gait assistance: Independent Gait Distance (Feet): 150  Feet Assistive device: None Gait Pattern/deviations: Step-through pattern, Decreased stride length   Gait velocity interpretation: 1.31 - 2.62 ft/sec, indicative of limited community ambulator   General Gait Details: pt with increased BOS with slight sway able to walk on RA with SpO2 >90%. pt walked 150' x 2 with seated rest  Stairs            Wheelchair Mobility    Modified Rankin (Stroke Patients Only)       Balance Overall balance assessment: Mild deficits observed, not formally tested                                           Pertinent Vitals/Pain Pain Assessment Pain Assessment: No/denies pain    Home Living Family/patient expects to be discharged to:: Private residence Living Arrangements: Alone Available Help at Discharge: Family;Available PRN/intermittently Type of Home: Apartment Home Access: Stairs to enter   Entrance Stairs-Number of Steps: 3   Home Layout: One level Home Equipment: Cane - single point;Shower seat;Hand held shower head      Prior Function               Mobility Comments: sometimes uses cane, mostly for stepping up curb and stairs       Hand Dominance        Extremity/Trunk Assessment   Upper Extremity Assessment Upper Extremity Assessment: Overall WFL for tasks assessed  Lower Extremity Assessment Lower Extremity Assessment: Overall WFL for tasks assessed    Cervical / Trunk Assessment Cervical / Trunk Assessment: Kyphotic  Communication   Communication: No difficulties  Cognition Arousal/Alertness: Awake/alert Behavior During Therapy: WFL for tasks assessed/performed Overall Cognitive Status: Within Functional Limits for tasks assessed                                          General Comments      Exercises     Assessment/Plan    PT Assessment Patient does not need any further PT services  PT Problem List         PT Treatment Interventions      PT Goals  (Current goals can be found in the Care Plan section)  Acute Rehab PT Goals PT Goal Formulation: All assessment and education complete, DC therapy    Frequency       Co-evaluation               AM-PAC PT "6 Clicks" Mobility  Outcome Measure Help needed turning from your back to your side while in a flat bed without using bedrails?: None Help needed moving from lying on your back to sitting on the side of a flat bed without using bedrails?: None Help needed moving to and from a bed to a chair (including a wheelchair)?: None Help needed standing up from a chair using your arms (e.g., wheelchair or bedside chair)?: None Help needed to walk in hospital room?: None Help needed climbing 3-5 steps with a railing? : A Little 6 Click Score: 23    End of Session   Activity Tolerance: Patient tolerated treatment well Patient left: in chair;with call bell/phone within reach Nurse Communication: Mobility status PT Visit Diagnosis: Other abnormalities of gait and mobility (R26.89)    Time: 1245-1314 PT Time Calculation (min) (ACUTE ONLY): 29 min   Charges:   PT Evaluation $PT Eval Low Complexity: [place], PT Acute Rehabilitation Services Office: [place] Amunique Neyra 09/23/2022, 1:28 PM

## 2022-09-23 NOTE — Progress Notes (Signed)
Subjective:   Patient continues to experience shortness of breath, wheezing, and cough.  Discussed that she is positive for RSV which may be worsening some undiagnosed underlying obstructive lung condition. She states that she believes one of her family members got her sick recently.  Discussed plan to continue with steroids, inhalers, and breathing treatments. Patient agrees with the plan.  Objective:  Vital signs in last 24 hours: Vitals:   09/23/22 0200 09/23/22 0400 09/23/22 0439 09/23/22 0445  BP: (!) 141/77 (!) 154/107  122/79  Pulse: 68 86  67  Resp: 20 (!) 22  20  Temp:   98.8 F (37.1 C)   TempSrc:   Oral   SpO2: 96% 96%  100%  Weight:      Height:       Physical Exam: General: Actively coughing Pulmonary: Increased work of breathing, audible wheezing, diffuse inspiratory and expiratory wheezes in all lung fields.  No crackles. Cardiovascular: Irregularly irregular rhythm, normal rate. No murmurs, rubs, or gallops.  Musculoskeletal: Normal range of motion. Trace lower extremity edema bilaterally. Skin: Warm and dry Neurological: Alert and oriented to person, place, and time. No focal deficit.  Psychiatric: Normal behavior.   Assessment/Plan:  Principal Problem:   Acute hypoxic respiratory failure ([place])  Allison Caldwell is a 76 y/o female with a past medical history of HTN, HLD, T2DM, anxiety, and depression who presents with 2 days of productive cough and fatigue and was admitted for acute hypoxic respiratory failure.  # Acute hypoxic respiratory failure Presented w/ 2 days of fatigue and productive cough. With EMS, O2 saturation was 88% requiring 15L on non-rebreather. She is currently satting > 95% on 1L. Vital signs WNL. CXR and CT PE unremarkable. Covid/influenza negative. Positive for RSV. Mildly elevated BNP at 327. Low suspicion for acute HF exacerbation given normal volume status on exam and lack of pulmonary edema on imaging studies. Also echo from 04/2022  demonstrates EF 50-55%. Acute hypoxic respiratory failure is likely secondary to exacerbation of underlying COPD vs reactive airway disease in the setting of RSV. Elevated BNP with enlarged pulm artery may indicate pulmonary artery hypertension secondary to obstructive lung disease. Would recommend PFTs in the outpatient setting. Patient has received 1X dose of azithromycin 500 mg and IV lasix 20 mg. Low suspicion for pneumonia at this time given that patient is afebrile, no crackles on exam, and no consolidation on CXR. On exam, patient has increased work of breathing, diffuse wheezes, and trace bilateral lower extremity edema.  Will continue to monitor her respiratory status, give supplemental oxygen, inhalers, DuoNebs, and prednisone. -Prednisone '40mg'$  x 5 days -Brovana nebulizer BID -Revenacin nebulizer daily -Duonebs q4 hours   # HTN # Hypokalemia # Hypomagnesemia Presented with hypokalemia and hypomagnesia on admission. Potentially related to HCTZ 25 mg daily use at home. Will replete potassium and magnesium today. BP WNL today.  -Continue home olmesartan 40 mg -Continue home diltiazem '420mg'$  daily -Hold HCTZ for hypokalemia -Trend BMP   # T2DM A1c 6.8% in 04/2021. Home regimen includes metformin '500mg'$  BID. -SSI   # Anxiety # Depression Home regimen includes Sertraline '100mg'$  daily and Buspirone '10mg'$  TID. -Continue Sertraline '100mg'$  daily and Buspirone '10mg'$  TID   # HLD Home regimen includes Pravastatin 20 mg. -Continue Pravastatin 20 mg   Diet: HH Bowel: none VTE: Lovenox IVF: none Code: FULL  Prior to Admission Living Arrangement: home Anticipated Discharge Location: TBD Barriers to Discharge: continued management Dispo: Anticipated discharge in approximately less than 2 day(s).  Starlyn Skeans, MD 09/23/2022, 6:15 AM Pager: [phone] After 5pm on weekdays and 1pm on weekends: On Call pager [phone]

## 2022-09-23 NOTE — Plan of Care (Addendum)
1040: Pt admitted to [facility], pt alert and oriented x4, pt on droplet precautions for RSV, pt came up on 3L [place], Pt was eventually able to be weaned to room air, pt bed alarm in place, due to hx of falls, pt daughter came to bedside. Bed in lowest position and call bell within reach   Pt worked with OT, Pt up with x1 assist and walker  Pt having coughing episodes, MD ordered PRN robitussin   1800:Per pt daughter Tillie Rung, pt hurt her Left great toe recently, and daughter concerned because Left great toe darker than other toes, RN assessed toes, no wounds at this time, pulses palpable and pt having sensation in L great toe, per daughter request RN Notified MD team, per Dr.Katsadouros Since pulses are palpable, will leave note for day team to assess in AM  1830:Pt vitals stable, pt on room air, pt denies pain at this time, pt resting in bed, pt bed alarm in place, bed in lowest position, and call bell within reach

## 2022-09-23 NOTE — H&P (Addendum)
Date: 09/23/2022               Patient Name:  Allison Caldwell MRN: [id number]  DOB: 01-12-1946 Age / Sex: 76 y.o., female   PCP: Angelica Pou, MD         Medical Service: Internal Medicine Teaching Service         Attending Physician: Dr. Evette Doffing, Mallie Mussel, *    First Contact: Dr. Starlyn Skeans, MD Pager: [phone]  Second Contact: Dr. Idamae Schuller, MD  Pager: [phone]        After Hours (After 5p/  First Contact Pager: [phone]  weekends / holidays): Second Contact Pager: [phone]   Chief Complaint: cough and fatigue  History of Present Illness:  Allison Caldwell is a 76 y/o female with a pmh of T2DM, anxiety, depression, HLD, HTN who presents with 2 days of productive cough and fatigue.Productive cough since Tuesday with associated fatigue. Says at baseline she does not have a cough, sputum production, wheezing or chest tightness.Usually able to walk down the stairs and to the mailbox, can't do this now. Says not due to SOB but just not feeling well. Never happened before. Says her dad came in town from [place] and was sick with fever.Otherwise denies,fever,chills,lightheadedness/dizziness, sneezing, sore throat, CP, SOB,orthopnea,PND,N/V, Diarrhea, myalgia, arthralgia, LE edema. Has not smoked in 15 years and even then was not a daily smoker.  Meds:  Accu-Chek FastClix Lancets MISC  aspirin EC 81 MG tablet  Blood Glucose Monitoring Suppl (ACCU-CHEK GUIDE) w/Device KIT  busPIRone (BUSPAR) 10 MG tablet  glucose blood (ACCU-CHEK GUIDE) test strip  hydrochlorothiazide (HYDRODIURIL) 25 MG tablet  metFORMIN (GLUCOPHAGE) 500 MG tablet  Multiple Vitamin (MULTIVITAMIN WITH MINERALS) TABS tablet  olmesartan (BENICAR) 40 MG tablet  pravastatin (PRAVACHOL) 20 MG tablet  sertraline (ZOLOFT) 100 MG tablet  TIADYLT ER 420 MG 24 hr capsule     Allergies: Allergies as of 09/22/2022 - Review Complete 09/22/2022  Allergen Reaction Noted   Ace inhibitors Other (See Comments)  04/06/2010   Penicillins Other (See Comments) 12/13/2011   Past Medical History:  Diagnosis Date   Anxiety    Cardiomyopathy    Degenerative joint disease    Depression    Diabetes mellitus    NIDDM   Hypertension    Obesities, morbid (HCC)    TIA (transient ischemic attack)     Family History:  Family History  Problem Relation Age of Onset   Heart disease Mother    Cancer Father    Diabetes Sister    Kidney disease Brother    Stroke Brother      Social History:  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 Son lives near by. Social security for income. Needs assistance with shopping and driving, otherwise does all IADLs/ADLs. Uses two canes for ambulation.No smoking in past 15 years. Drinks alcohol every couple of months.  Review of Systems: A complete ROS was negative except as per HPI.   Physical Exam: Blood pressure 121/77, pulse 64, temperature 98.7 F (37.1 C), temperature source Rectal, resp. rate 19, height _0  (1.626 m), weight 113.4 kg, SpO2 99 %. Gen:Non-toxic, some acute distress,obese, cooperative and pleasant HEENT: No ptp over the sinuses, oropharynx clear Pulm: Increased work of breathing, audible wheezing, diffuse inspiratory and expiratory wheezing at all posterior and anterior listening posts LO:VFIEPP s1/s2, irregularly irregular rhythm, normal rate, no murmurs appreciated although difficult to hear with loud and continuous wheezes, 2+ bilateral radial and pedal pulses, capillary refill 3-4  seconds Extremities: Warm, dry,poor skin turgor of the chest wall, no LE edema, no digital clubbing   EKG: personally reviewed my interpretation is: T wave inversion II,II,aVF, v2-6, irregularly irregular rhythm with varying P wave morphology and regular rate consistent with wandering pacemaker,QT prolongation, no ST segment changes  CXR: personally reviewed my interpretation DU:PBDHDI lung volumes, no consolidation or effusion, no pulmonary edema  Assessment & Plan by  Problem:  Allison Caldwell is a 76 y/o female with a pmh of T2DM, anxiety, depression, HLD, HTN who presents with 2 days of productive cough and fatigue.  Acute hypoxic respiratory failure Patient reports 2 days of productive cough and fatigue and presented by EMS with O2 sat of 88% requiring non-rebreather of 15L, now satting 96-100% on 1-2L [place]. She has been afebrile, hemodynamically stable with minimal tachypnea,normal wbc, no consolidation on CXR, no intrapulmonary process on CTA chest and also negative for PE, COVID/Flu (-) but RSV (+), BNP minimally elevated to 327 without prior comparison.On exam she was found to have increased work of breathing with diffuse inspiratory and expiratory wheezing without crackles, lower extremity edema. Given normal volume exam without crackles or pulmonary edema, this is not consistent with HF. Of note most recent echo 04/2022 with LVEF 50-55% and indeterminate diastolic parameters.Her diffuse acute hypoxic respiratory failure with diffuse wheezing in the setting of RSV may be indicative of exacerbation of underlying COPD or reactive airway disease. Alternatively this could all just be sequelae of RSV.There has been question of this in the past. Of note, she has not smoked in 15 years, was never a daily smoker, and has normal lung volumes on CXR. Will treat as an exacerbation and should have PFTs done in the outpatient setting. Elevated BNP with enlarged pulm artery may indicate pulmonary artery hypertension secondary to obstructive lung disease, should see pulm for further evaluation. -prednisone 32m x 5 days -Brovana nebulizer BID -revenacin nebulizer daily -duonebs q4hrs  HTN Hypokalemia Found to have hypokalemia to 3 and magnesemia to 1.4 in the setting of HCTZ use. Potassium found to be 3.2 s/p IV Mg and potassium 40. -continue olmesartan 40 -continue diltiazem 4252mdaily -Hold HCTZ 2525maily in s/o hypokalemia -daily BMP, replete electrolytes as  needed  T2DM Most recent A1c 04/2021 6.8%. Home regimen includes metformin 500m78mD. BG elevated to the low to mid 200s. -SSI  Anxiety Depression Sertraline 100mg29mly Buspirone 10mg 69m HLD Pravastatin 20  Code Status: Full code  Dispo: Admit patient to Inpatient with expected length of stay greater than 2 midnights.  Signed: RogersIona Coach1/24/2023, 12:54 AM  Pager: [phone] 5pm on weekdays and 1pm on weekends: On Call pager: [phone]

## 2022-09-23 NOTE — ED Notes (Signed)
Pt received breakfast tray and was happy to eat it.

## 2022-09-23 NOTE — Evaluation (Signed)
Occupational Therapy Evaluation and Discharge Patient Details Name: Allison Caldwell MRN: [id number] DOB: 1946/07/13 Today's Date: 09/23/2022   History of Present Illness 76 yo female admitted 11/23 with acute hypoxic respiratory failure due to RSV. PMhx: T2DM, obesity, HTN, HLD   Clinical Impression   This 76 yo female admitted with above presents to acute OT with all education completed and toileting tongs issued, acute OT will sign off.      Recommendations for follow up therapy are one component of a multi-disciplinary discharge planning process, led by the attending physician.  Recommendations may be updated based on patient status, additional functional criteria and insurance authorization.   Follow Up Recommendations  No OT follow up     Assistance Recommended at Discharge None     Functional Status Assessment  Patient has not had a recent decline in their functional status  Equipment Recommendations  None recommended by OT       Precautions / Restrictions Precautions Precautions: Other (comment) Precaution Comments: watch sats             ADL either performed or assessed with clinical judgement   ADL                                         General ADL Comments: Per secure chat with PT that the only trouble pt has is with back peri care and pt agrees this is indeed an issue. Showed her a toilet aid (toilet toings) and showed her how to use it with a wet washcloth (and that she can use it with a wet wipes as well). We also discussed the use of an add on bidet to a toilet. Pt politely declined practicing with toilet aid and said she would use it at home.     Vision Patient Visual Report: No change from baseline              Pertinent Vitals/Pain Pain Assessment Pain Assessment: No/denies pain     Hand Dominance Right   Extremity/Trunk Assessment Upper Extremity Assessment Upper Extremity Assessment: Overall WFL for tasks assessed       Communication Communication Communication: No difficulties   Cognition Arousal/Alertness: Awake/alert Behavior During Therapy: WFL for tasks assessed/performed Overall Cognitive Status: Within Functional Limits for tasks assessed                                                  Home Living Family/patient expects to be discharged to:: Private residence Living Arrangements: Alone Available Help at Discharge: Family;Available PRN/intermittently Type of Home: Apartment Home Access: Stairs to enter Entrance Stairs-Number of Steps: 3 Entrance Stairs-Rails: None Home Layout: One level     Bathroom Shower/Tub: Teacher, early years/pre: Standard     Home Equipment: Cane - single point;Shower seat;Hand held shower head          Prior Functioning/Environment Prior Level of Function : Needs assist             Mobility Comments: sometimes uses cane, mostly for stepping up curb and stairs ADLs Comments: usually sits at sink to bathe; son does grocery shopping        OT Problem List: Obesity         OT Goals(Current  goals can be found in the care plan section) Acute Rehab OT Goals Patient Stated Goal: to be able to clean herself easier and go home soon         AM-PAC OT "6 Clicks" Daily Activity     Outcome Measure Help from another person eating meals?: None Help from another person taking care of personal grooming?: None Help from another person toileting, which includes using toliet, bedpan, or urinal?: None Help from another person bathing (including washing, rinsing, drying)?: None Help from another person to put on and taking off regular upper body clothing?: None Help from another person to put on and taking off regular lower body clothing?: None 6 Click Score: 24   End of Session Nurse Communication:  (pt feeling nauseated and dizzy just laying in bed (thinks it might be her insulin she just got without much on her stomach).  RN to check on pt right away)  Activity Tolerance: Patient tolerated treatment well Patient left: in bed                   Time: 5597-4163 OT Time Calculation (min): 11 min Charges:  OT General Charges $OT Visit: 1 Visit OT Evaluation $OT Eval Low Complexity: [place], OTR/L Acute Rehab Services [facility] [phone] Office [phone]    Almon Register 09/23/2022, 4:35 PM

## 2022-09-23 NOTE — ED Notes (Signed)
ED TO INPATIENT HANDOFF REPORT  ED Nurse Name and Phone #: Josh  S Name/Age/Gender Allison Caldwell 76 y.o. female Room/Bed: 037C/037C  Code Status   Code Status: Full Code  Home/SNF/Other Home Patient oriented to: self, place, time, and situation Is this baseline? Yes   Triage Complete: Triage complete  Chief Complaint Acute hypoxic respiratory failure ([place]) [J96.01]  Triage Note Pt BIB EMS. Per EMS, pt has had increasing SOB over the last several days. Pt's family has been dealing with respiratory symptoms over the course of the week. No diagnoses from other family members. Pt RA O2 sats at 88%. Pt placed on non re-breather at 15L Pt refused BiPap. Currently A/O x 4     Allergies Allergies  Allergen Reactions   Ace Inhibitors Other (See Comments)     cough   Penicillins Other (See Comments)    Muscle spasms, Has patient had a PCN reaction causing immediate rash, facial/tongue/throat swelling, SOB or lightheadedness with hypotension: Yes Has patient had a PCN reaction causing severe rash involving mucus membranes or skin necrosis: No Has patient had a PCN reaction that required hospitalization No Has patient had a PCN reaction occurring within the last 10 years: Yes If all of the above answers are "NO", then may proceed with Cephalosporin use.     Level of Care/Admitting Diagnosis ED Disposition     ED Disposition  Admit   Condition  --   [place]: [place] [[id number]]  Level of Care: Med-Surg [16]  May place patient in observation at [facility] or [place] if equivalent level of care is available:: No  Covid Evaluation: Asymptomatic - no recent exposure (last 10 days) testing not required  Diagnosis: Acute hypoxic respiratory failure [facility]) [[id number]]  Admitting Physician: Axel Filler [[id number]]  Attending Physician: Axel Filler [phone]          B Medical/Surgery History Past Medical History:   Diagnosis Date   Anxiety    Cardiomyopathy    Degenerative joint disease    Depression    Diabetes mellitus    NIDDM   Hypertension    Obesities, morbid ([place])    TIA (transient ischemic attack)    Past Surgical History:  Procedure Laterality Date   CESAREAN SECTION     times 2   EYE SURGERY     laser - right eye 2010   left wrist     surgical repair   ORIF ANKLE FRACTURE     right     A IV Location/Drains/Wounds Patient Lines/Drains/Airways Status     Active Line/Drains/Airways     Name Placement date Placement time Site Days   Peripheral IV 09/22/22 20 G Right Antecubital 09/22/22  1945  Antecubital  1   External Urinary Catheter 09/23/22  0026  --  less than 1            Intake/Output Last 24 hours  Intake/Output Summary (Last 24 hours) at 09/23/2022 4917 Last data filed at 09/23/2022 9150 Gross per 24 hour  Intake 96.49 ml  Output --  Net 96.49 ml    Labs/Imaging Results for orders placed or performed during the hospital encounter of 09/22/22 (from the past 48 hour(s))  Respiratory (~20 pathogens) panel by PCR     Status: Abnormal   Collection Time: 09/22/22  7:37 PM   Specimen: Nasopharyngeal Swab; Respiratory  Result Value Ref Range   Adenovirus NOT DETECTED NOT DETECTED   Coronavirus 229E NOT  DETECTED NOT DETECTED    Comment: (NOTE) The Coronavirus on the Respiratory Panel, DOES NOT test for the novel  Coronavirus (2019 nCoV)    Coronavirus HKU1 NOT DETECTED NOT DETECTED   Coronavirus NL63 NOT DETECTED NOT DETECTED   Coronavirus OC43 NOT DETECTED NOT DETECTED   Metapneumovirus NOT DETECTED NOT DETECTED   Rhinovirus / Enterovirus NOT DETECTED NOT DETECTED   Influenza A NOT DETECTED NOT DETECTED   Influenza B NOT DETECTED NOT DETECTED   Parainfluenza Virus 1 NOT DETECTED NOT DETECTED   Parainfluenza Virus 2 NOT DETECTED NOT DETECTED   Parainfluenza Virus 3 NOT DETECTED NOT DETECTED   Parainfluenza Virus 4 NOT DETECTED NOT DETECTED    Respiratory Syncytial Virus DETECTED (A) NOT DETECTED   Bordetella pertussis NOT DETECTED NOT DETECTED   Bordetella Parapertussis NOT DETECTED NOT DETECTED   Chlamydophila pneumoniae NOT DETECTED NOT DETECTED   Mycoplasma pneumoniae NOT DETECTED NOT DETECTED    Comment: Performed at [facility], [place] [address] , [place] [address]  Basic metabolic panel     Status: Abnormal   Collection Time: 09/22/22  7:44 PM  Result Value Ref Range   Sodium 135 135 - 145 mmol/L   Potassium 3.0 (L) 3.5 - 5.1 mmol/L   Chloride 91 (L) 98 - 111 mmol/L   CO2 25 22 - 32 mmol/L   Glucose, Bld 247 (H) 70 - 99 mg/dL    Comment: Glucose reference range applies only to samples taken after fasting for at least 8 hours.   BUN 5 (L) 8 - 23 mg/dL   Creatinine, Ser 0.86 0.44 - 1.00 mg/dL   Calcium 8.5 (L) 8.9 - 10.3 mg/dL   GFR, Estimated >60 >60 mL/min    Comment: (NOTE) Calculated using the CKD-EPI Creatinine Equation (2021)    Anion gap 19 (H) 5 - 15    Comment: Performed at [facility] [address] [place], [place] [address]  CBC with Differential     Status: None   Collection Time: 09/22/22  7:44 PM  Result Value Ref Range   WBC 7.1 4.0 - 10.5 K/uL   RBC 4.53 3.87 - 5.11 MIL/uL   Hemoglobin 13.8 12.0 - 15.0 g/dL   HCT 42.8 36.0 - 46.0 %   MCV 94.5 80.0 - 100.0 fL   MCH 30.5 26.0 - 34.0 pg   MCHC 32.2 30.0 - 36.0 g/dL   RDW 15.2 11.5 - 15.5 %   Platelets 157 150 - 400 K/uL   nRBC 0.0 0.0 - 0.2 %   Neutrophils Relative % 51 %   Neutro Abs 3.5 1.7 - 7.7 K/uL   Lymphocytes Relative 34 %   Lymphs Abs 2.4 0.7 - 4.0 K/uL   Monocytes Relative 14 %   Monocytes Absolute 1.0 0.1 - 1.0 K/uL   Eosinophils Relative 1 %   Eosinophils Absolute 0.1 0.0 - 0.5 K/uL   Basophils Relative 0 %   Basophils Absolute 0.0 0.0 - 0.1 K/uL   Immature Granulocytes 0 %   Abs Immature Granulocytes 0.03 0.00 - 0.07 K/uL    Comment: Performed at [facility], [address], [place], [place]  [address]  Brain natriuretic peptide     Status: Abnormal   Collection Time: 09/22/22  7:44 PM  Result Value Ref Range   B Natriuretic Peptide 372.1 (H) 0.0 - 100.0 pg/mL    Comment: Performed at [facility] [address] [place], [place] [address]  Resp Panel by RT-PCR (Flu A&B, Covid) Anterior Nasal Swab     Status: None   Collection Time: 09/22/22  7:44 PM   Specimen: Anterior Nasal Swab  Result Value Ref Range   SARS Coronavirus 2 by RT PCR NEGATIVE NEGATIVE    Comment: (NOTE) SARS-CoV-2 target nucleic acids are NOT DETECTED.  The SARS-CoV-2 RNA is generally detectable in upper respiratory specimens during the acute phase of infection. The lowest concentration of SARS-CoV-2 viral copies this assay can detect is 138 copies/mL. A negative result does not preclude SARS-Cov-2 infection and should not be used as the sole basis for treatment or other patient management decisions. A negative result may occur with  improper specimen collection/handling, submission of specimen other than nasopharyngeal swab, presence of viral mutation(s) within the areas targeted by this assay, and inadequate number of viral copies(<138 copies/mL). A negative result must be combined with clinical observations, patient history, and epidemiological information. The expected result is Negative.  Fact Sheet for Patients:  EntrepreneurPulse.com.au  Fact Sheet for Healthcare Providers:  IncredibleEmployment.be  This test is no t yet approved or cleared by the [place] FDA and  has been authorized for detection and/or diagnosis of SARS-CoV-2 by FDA under an Emergency Use Authorization (EUA). This EUA will remain  in effect (meaning this test can be used) for the duration of the COVID-19 declaration under Section 564(b)(1) of the Act, 21 U.S.C.section 360bbb-3(b)(1), unless the authorization is terminated  or revoked sooner.       Influenza A by PCR NEGATIVE  NEGATIVE   Influenza B by PCR NEGATIVE NEGATIVE    Comment: (NOTE) The Xpert Xpress SARS-CoV-2/FLU/RSV plus assay is intended as an aid in the diagnosis of influenza from Nasopharyngeal swab specimens and should not be used as a sole basis for treatment. Nasal washings and aspirates are unacceptable for Xpert Xpress SARS-CoV-2/FLU/RSV testing.  Fact Sheet for Patients: EntrepreneurPulse.com.au  Fact Sheet for Healthcare Providers: IncredibleEmployment.be  This test is not yet approved or cleared by the [place] FDA and has been authorized for detection and/or diagnosis of SARS-CoV-2 by FDA under an Emergency Use Authorization (EUA). This EUA will remain in effect (meaning this test can be used) for the duration of the COVID-19 declaration under Section 564(b)(1) of the Act, 21 U.S.C. section 360bbb-3(b)(1), unless the authorization is terminated or revoked.  Performed at [facility], [place] [address], [place], [place] [address]   I-stat chem 8, ED     Status: Abnormal   Collection Time: 09/22/22  7:55 PM  Result Value Ref Range   Sodium 131 (L) 135 - 145 mmol/L   Potassium 3.8 3.5 - 5.1 mmol/L   Chloride 90 (L) 98 - 111 mmol/L   BUN 5 (L) 8 - 23 mg/dL   Creatinine, Ser 0.60 0.44 - 1.00 mg/dL   Glucose, Bld 264 (H) 70 - 99 mg/dL    Comment: Glucose reference range applies only to samples taken after fasting for at least 8 hours.   Calcium, Ion 0.95 (L) 1.15 - 1.40 mmol/L   TCO2 27 22 - 32 mmol/L   Hemoglobin 15.0 12.0 - 15.0 g/dL   HCT 44.0 36.0 - 46.0 %  CBC     Status: Abnormal   Collection Time: 09/23/22  4:34 AM  Result Value Ref Range   WBC 6.4 4.0 - 10.5 K/uL   RBC 4.28 3.87 - 5.11 MIL/uL   Hemoglobin 13.2 12.0 - 15.0 g/dL   HCT 40.0 36.0 - 46.0 %  MCV 93.5 80.0 - 100.0 fL   MCH 30.8 26.0 - 34.0 pg   MCHC 33.0 30.0 - 36.0 g/dL   RDW 15.6 (H) 11.5 - 15.5 %   Platelets 136 (L) 150 - 400 K/uL   nRBC 0.0 0.0 - 0.2 %     Comment: Performed at [facility] [address], [place], [place] [address]  Basic metabolic panel     Status: Abnormal   Collection Time: 09/23/22  4:34 AM  Result Value Ref Range   Sodium 138 135 - 145 mmol/L   Potassium 3.2 (L) 3.5 - 5.1 mmol/L   Chloride 93 (L) 98 - 111 mmol/L   CO2 28 22 - 32 mmol/L   Glucose, Bld 208 (H) 70 - 99 mg/dL    Comment: Glucose reference range applies only to samples taken after fasting for at least 8 hours.   BUN <5 (L) 8 - 23 mg/dL   Creatinine, Ser 0.67 0.44 - 1.00 mg/dL   Calcium 8.7 (L) 8.9 - 10.3 mg/dL   GFR, Estimated >60 >60 mL/min    Comment: (NOTE) Calculated using the CKD-EPI Creatinine Equation (2021)    Anion gap 17 (H) 5 - 15    Comment: Performed at [facility] [address] [place], [place] [address]  Magnesium     Status: Abnormal   Collection Time: 09/23/22  4:34 AM  Result Value Ref Range   Magnesium 1.4 (L) 1.7 - 2.4 mg/dL    Comment: Performed at [facility] [address] [place], [place] [address]  CBG monitoring, ED     Status: Abnormal   Collection Time: 09/23/22  7:57 AM  Result Value Ref Range   Glucose-Capillary 274 (H) 70 - 99 mg/dL    Comment: Glucose reference range applies only to samples taken after fasting for at least 8 hours.   CT Angio Chest PE W/Cm &/Or Wo Cm  Result Date: 09/22/2022 CLINICAL DATA:  Pulmonary embolism (PE) suspected, high prob. Shortness of breath EXAM: CT ANGIOGRAPHY CHEST WITH CONTRAST TECHNIQUE: Multidetector CT imaging of the chest was performed using the standard protocol during bolus administration of intravenous contrast. Multiplanar CT image reconstructions and MIPs were obtained to evaluate the vascular anatomy. RADIATION DOSE REDUCTION: This exam was performed according to the departmental dose-optimization program which includes automated exposure control, adjustment of the mA and/or kV according to patient size and/or use of iterative reconstruction  technique. CONTRAST:  63m OMNIPAQUE IOHEXOL 350 MG/ML SOLN COMPARISON:  None Available. FINDINGS: Cardiovascular: Markedly dilated main pulmonary artery measuring up to 6.6 cm compatible with pulmonary arterial hypertension. Cardiomegaly. No filling defects in the pulmonary arteries to suggest pulmonary emboli. Aorta normal caliber with scattered calcifications. Mediastinum/Nodes: No mediastinal, hilar, or axillary adenopathy. Trachea and esophagus are unremarkable. Thyroid unremarkable. Lungs/Pleura: Linear areas of atelectasis or scarring in the left lower lobe and right mid lung. No effusions. Upper Abdomen: No acute findings Musculoskeletal: Chest wall soft tissues are unremarkable. No acute bony abnormality. Review of the MIP images confirms the above findings. IMPRESSION: No evidence of pulmonary embolus. Markedly dilated main pulmonary artery most compatible with pulmonary arterial hypertension. No acute cardiopulmonary disease. Aortic Atherosclerosis (ICD10-I70.0). Electronically Signed   By: KRolm BaptiseM.D.   On: 09/22/2022 22:54   DG Chest Port 1 View  Result Date: 09/22/2022 CLINICAL DATA:  Shortness of breath. EXAM: PORTABLE CHEST 1 VIEW COMPARISON:  05/06/2022. FINDINGS: Heart is enlarged and the mediastinal contour stable. There is  atherosclerotic calcification of the aorta. Minimal subsegmental atelectasis in the mid left lung. No consolidation, effusion, or pneumothorax. No acute osseous abnormality. IMPRESSION: Cardiomegaly with no active disease. Electronically Signed   By: Brett Fairy M.D.   On: 09/22/2022 20:06    Pending Labs Unresulted Labs (From admission, onward)     Start     Ordered   09/23/22 0500  Hemoglobin A1c  Tomorrow morning,   R        09/23/22 0055            Vitals/Pain Today's Vitals   09/23/22 0445 09/23/22 0600 09/23/22 0837 09/23/22 0904  BP: 122/79 130/88  (!) 141/128  Pulse: 67 76  (!) 110  Resp: 20 20  (!) 24  Temp:   98.6 F (37 C)    TempSrc:   Oral   SpO2: 100% 96%  95%  Weight:      Height:      PainSc:        Isolation Precautions Droplet precaution  Medications Medications  enoxaparin (LOVENOX) injection 40 mg (40 mg Subcutaneous Given 09/23/22 0803)  predniSONE (DELTASONE) tablet 40 mg (40 mg Oral Given 09/23/22 0056)  ipratropium-albuterol (DUONEB) 0.5-2.5 (3) MG/3ML nebulizer solution 3 mL (3 mLs Nebulization Given 09/23/22 0755)  arformoterol (BROVANA) nebulizer solution 15 mcg (15 mcg Nebulization Given 09/23/22 0755)  revefenacin (YUPELRI) nebulizer solution 175 mcg (175 mcg Nebulization Given 09/23/22 0755)  sertraline (ZOLOFT) tablet 100 mg (has no administration in time range)  pravastatin (PRAVACHOL) tablet 20 mg (has no administration in time range)  irbesartan (AVAPRO) tablet 300 mg (has no administration in time range)  diltiazem (CARDIZEM CD) 24 hr capsule 420 mg (has no administration in time range)  insulin aspart (novoLOG) injection 0-20 Units (11 Units Subcutaneous Given 09/23/22 0803)  busPIRone (BUSPAR) tablet 10 mg (has no administration in time range)  ipratropium-albuterol (DUONEB) 0.5-2.5 (3) MG/3ML nebulizer solution 3 mL (3 mLs Nebulization Given 09/22/22 1951)  potassium chloride SA (KLOR-CON M) CR tablet 40 mEq (40 mEq Oral Given 09/22/22 2127)  magnesium sulfate IVPB 2 g 50 mL (0 g Intravenous Stopped 09/22/22 2228)  albuterol (PROVENTIL) (2.5 MG/3ML) 0.083% nebulizer solution (5 mg/hr Nebulization Given 09/22/22 2159)  iohexol (OMNIPAQUE) 350 MG/ML injection 75 mL (75 mLs Intravenous Contrast Given 09/22/22 2248)  furosemide (LASIX) injection 20 mg (20 mg Intravenous Given 09/22/22 2338)  azithromycin (ZITHROMAX) tablet 500 mg (500 mg Oral Given 09/23/22 0057)  magnesium sulfate IVPB 2 g 50 mL (0 g Intravenous Stopped 09/23/22 0714)  potassium chloride SA (KLOR-CON M) CR tablet 40 mEq (40 mEq Oral Given 09/23/22 0610)    Mobility walks     Focused Assessments Pulmonary  Assessment Handoff:  Lung sounds: Bilateral Breath Sounds: Diminished, Expiratory wheezes L Breath Sounds: Expiratory wheezes R Breath Sounds: Expiratory wheezes O2 Device: Nasal Cannula O2 Flow Rate (L/min): 2 L/min    R Recommendations: See Admitting Provider Note  Report given to:   Additional Notes:

## 2022-09-23 NOTE — Inpatient Diabetes Management (Signed)
Inpatient Diabetes Program Recommendations  AACE/ADA: New Consensus Statement on Inpatient Glycemic Control (2015)  Target Ranges:  Prepandial:   less than 140 mg/dL      Peak postprandial:   less than 180 mg/dL (1-2 hours)      Critically ill patients:  140 - 180 mg/dL   Lab Results  Component Value Date   GLUCAP 329 (H) 09/23/2022   HGBA1C 9.3 (H) 05/07/2022    Review of Glycemic Control  Latest Reference Range & Units 09/23/22 07:57 09/23/22 11:47  Glucose-Capillary 70 - 99 mg/dL 274 (H) 329 (H)   Diabetes history: DM 2 Outpatient Diabetes medications: metformin 500 mg bid Current orders for Inpatient glycemic control:  Novolog 0-20 units tid   PO prednisone 40 mg Daily  Inpatient Diabetes Program Recommendations:    -   Start Levemir 10 units -   Start Novolog 4 units tid meal coverage if eating >50% of meals  Thanks,  Tama Headings RN, MSN, BC-ADM Inpatient Diabetes Coordinator Team Pager [phone] (8a-5p)

## 2022-09-24 DIAGNOSIS — J205 Acute bronchitis due to respiratory syncytial virus: Secondary | ICD-10-CM | POA: Diagnosis not present

## 2022-09-24 DIAGNOSIS — J9601 Acute respiratory failure with hypoxia: Secondary | ICD-10-CM | POA: Diagnosis not present

## 2022-09-24 LAB — GLUCOSE, CAPILLARY
Glucose-Capillary: 201 mg/dL — ABNORMAL HIGH (ref 70–99)
Glucose-Capillary: 228 mg/dL — ABNORMAL HIGH (ref 70–99)
Glucose-Capillary: 385 mg/dL — ABNORMAL HIGH (ref 70–99)

## 2022-09-24 LAB — MAGNESIUM: Magnesium: 1.7 mg/dL (ref 1.7–2.4)

## 2022-09-24 LAB — BASIC METABOLIC PANEL
Anion gap: 12 (ref 5–15)
BUN: 8 mg/dL (ref 8–23)
CO2: 31 mmol/L (ref 22–32)
Calcium: 8.7 mg/dL — ABNORMAL LOW (ref 8.9–10.3)
Chloride: 95 mmol/L — ABNORMAL LOW (ref 98–111)
Creatinine, Ser: 0.62 mg/dL (ref 0.44–1.00)
GFR, Estimated: >60 mL/min (ref >60)
Glucose, Bld: 161 mg/dL — ABNORMAL HIGH (ref 70–99)
Potassium: 3.5 mmol/L (ref 3.5–5.1)
Sodium: 138 mmol/L (ref 135–145)

## 2022-09-24 LAB — HEMOGLOBIN A1C
Hgb A1c MFr Bld: 9.8 % — ABNORMAL HIGH (ref 4.8–5.6)
Mean Plasma Glucose: 235 mg/dL

## 2022-09-24 MED ORDER — GUAIFENESIN-DM 100-10 MG/5ML PO SYRP
10.0000 mL | ORAL_SOLUTION | ORAL | Status: DC | PRN
  Administered 2022-09-24 (×2): 10 mL via ORAL
  Filled 2022-09-24 (×2): qty 10

## 2022-09-24 MED ORDER — MENTHOL 3 MG MT LOZG
1.0000 | LOZENGE | OROMUCOSAL | Status: DC | PRN
  Filled 2022-09-24: qty 9

## 2022-09-24 MED ORDER — INSULIN ASPART 100 UNIT/ML IJ SOLN
5.0000 [IU] | Freq: Three times a day (TID) | INTRAMUSCULAR | Status: DC
  Administered 2022-09-24: 5 [IU] via SUBCUTANEOUS

## 2022-09-24 NOTE — Progress Notes (Signed)
   Subjective:   Patient doing well. States had coughing overnight but robitussin helped. She feels fatigued and states does not have support at home to be discharged today.   Objective:  Vital signs in last 24 hours: Vitals:   09/23/22 1600 09/23/22 2027 09/23/22 2337 09/24/22 0437  BP:  135/75  137/65  Pulse:  76 83 70  Resp:  '20 20 17  '$ Temp:  98 F (36.7 C)  98 F (36.7 C)  TempSrc:  Oral  Oral  SpO2: 93% 94% 93% 93%  Weight:      Height:       Physical Exam: General: NAD HENT: NCAT, no [place] present Pulmonary: diffuse wheeze present Cardiovascular:  NSR Musculoskeletal: No asymmetry  Skin: no wounds on exposed skin Neurological: alert and oriented x4 Psychiatric: normal mood and normal affect  Assessment/Plan:  Principal Problem:   Acute bronchitis due to respiratory syncytial virus (RSV) Active Problems:   Diabetes type 2, controlled (HCC)   Morbid obesity with BMI of 45.0-49.9, adult (HCC)   Hypokalemia   Acute hypoxic respiratory failure (HCC)   Pulmonary hypertension (HCC)  Allison Caldwell is a 76 y/o female with a past medical history of HTN, HLD, T2DM, anxiety, and depression who presents with 2 days of productive cough and fatigue and was admitted for acute hypoxic respiratory failure.  # Acute hypoxic respiratory failure Improving. Plan to continue treatment inpatient today and plan for discharge tomorrow. Will have OP follow up with plan for PFTs.  -Prednisone '40mg'$  x 5 days (2/5) -Brovana nebulizer BID -Revenacin nebulizer daily -Duonebs q4 hours   # HTN # Hypokalemia # Hypomagnesemia BP WNL today. K improved to 3.5. Will replete today. Mag was 1.4 and repleted. Will check mag level before K repletion.  -Continue home olmesartan 40 mg -Continue home diltiazem '420mg'$  daily -Restart home HCTZ before discharge     # T2DM A1c 6.8% in 04/2021. Home regimen includes metformin '500mg'$  BID. SSI at resistant and pt used 29 units in the last 24 hours. CBG around  200. Will continue rSSI but add 5 units mealtime for better control while on prednisone. Will discontinue insulin at discharge.    # Anxiety # Depression Home regimen includes Sertraline '100mg'$  daily and Buspirone '10mg'$  TID. -Continue Sertraline '100mg'$  daily and Buspirone '10mg'$  TID   # HLD Home regimen includes Pravastatin 20 mg. -Continue Pravastatin 20 mg   Diet: Regular diet Bowel: none VTE: Lovenox IVF: none Code: FULL  Prior to Admission Living Arrangement: home Anticipated Discharge Location: Home Barriers to Discharge: continued management Dispo: Anticipated discharge in approximately less than 1 day(s).   Idamae Schuller, MD 09/24/2022, 6:54 AM Pager: [phone] After 5pm on weekdays and 1pm on weekends: On Call pager [phone]

## 2022-09-24 NOTE — Progress Notes (Signed)
Mobility Specialist Progress Note:   09/24/22 0905  Mobility  Activity Ambulated independently in room  Level of Assistance Independent  Assistive Device None  Distance Ambulated (ft) 25 ft  Activity Response Tolerated well  Mobility Referral Yes  $Mobility charge 1 Mobility   Pt received in bed and agreeable with encouragement. C/o fatigue from sitting in chair and from coughing fits. Deferred hallway ambulation d/t fatigue and BM. Pt left in bathroom with all needs met and call bell in reach.   Andrey Campanile Mobility Specialist Please contact via SecureChat or  Rehab office at [phone]

## 2022-09-24 NOTE — Care Management Obs Status (Signed)
[facility] NOTIFICATION   Patient Details  Name: Iyannah Blake MRN: [id number] Date of Birth: 02-Sep-1946   Medicare Observation Status Notification Given:  Yes    Tiffiney Sparrow G., RN 09/24/2022, 10:18 AM

## 2022-09-25 DIAGNOSIS — J9601 Acute respiratory failure with hypoxia: Secondary | ICD-10-CM | POA: Diagnosis not present

## 2022-09-25 DIAGNOSIS — J205 Acute bronchitis due to respiratory syncytial virus: Secondary | ICD-10-CM | POA: Diagnosis not present

## 2022-09-25 LAB — GLUCOSE, CAPILLARY
Glucose-Capillary: 241 mg/dL — ABNORMAL HIGH (ref 70–99)
Glucose-Capillary: 78 mg/dL (ref 70–99)
Glucose-Capillary: 489 mg/dL — ABNORMAL HIGH (ref 70–99)
Glucose-Capillary: 238 mg/dL — ABNORMAL HIGH (ref 70–99)

## 2022-09-25 LAB — BASIC METABOLIC PANEL
Anion gap: 14 (ref 5–15)
BUN: 14 mg/dL (ref 8–23)
CO2: 26 mmol/L (ref 22–32)
Calcium: 8.7 mg/dL — ABNORMAL LOW (ref 8.9–10.3)
Chloride: 95 mmol/L — ABNORMAL LOW (ref 98–111)
Creatinine, Ser: 0.63 mg/dL (ref 0.44–1.00)
GFR, Estimated: >60 mL/min (ref >60)
Glucose, Bld: 219 mg/dL — ABNORMAL HIGH (ref 70–99)
Potassium: 3.7 mmol/L (ref 3.5–5.1)
Sodium: 135 mmol/L (ref 135–145)

## 2022-09-25 MED ORDER — MAGNESIUM SULFATE 2 GM/50ML IV SOLN
2.0000 g | Freq: Once | INTRAVENOUS | Status: AC
  Administered 2022-09-25: 2 g via INTRAVENOUS
  Filled 2022-09-25: qty 50

## 2022-09-25 MED ORDER — IPRATROPIUM-ALBUTEROL 0.5-2.5 (3) MG/3ML IN SOLN: 3.0000 mL | RESPIRATORY_TRACT | Status: DC

## 2022-09-25 MED ORDER — GUAIFENESIN-DM 100-10 MG/5ML PO SYRP
10.0000 mL | ORAL_SOLUTION | ORAL | Status: DC
  Administered 2022-09-25 – 2022-09-26 (×7): 10 mL via ORAL
  Filled 2022-09-25 (×7): qty 10

## 2022-09-25 MED ORDER — IPRATROPIUM-ALBUTEROL 0.5-2.5 (3) MG/3ML IN SOLN
3.0000 mL | Freq: Two times a day (BID) | RESPIRATORY_TRACT | Status: DC
  Administered 2022-09-25 – 2022-09-26 (×2): 3 mL via RESPIRATORY_TRACT
  Filled 2022-09-25 (×2): qty 3

## 2022-09-25 MED ORDER — INSULIN GLARGINE-YFGN 100 UNIT/ML ~~LOC~~ SOLN
10.0000 [IU] | Freq: Every day | SUBCUTANEOUS | Status: DC
  Administered 2022-09-25: 10 [IU] via SUBCUTANEOUS
  Filled 2022-09-25 (×2): qty 0.1

## 2022-09-25 MED ORDER — INSULIN ASPART 100 UNIT/ML IJ SOLN
8.0000 [IU] | Freq: Three times a day (TID) | INTRAMUSCULAR | Status: DC
  Administered 2022-09-25 – 2022-09-26 (×3): 8 [IU] via SUBCUTANEOUS

## 2022-09-25 MED ORDER — INSULIN ASPART 100 UNIT/ML IJ SOLN
10.0000 [IU] | Freq: Three times a day (TID) | INTRAMUSCULAR | Status: DC
  Administered 2022-09-25: 10 [IU] via SUBCUTANEOUS

## 2022-09-25 MED ORDER — HYDROCHLOROTHIAZIDE 25 MG PO TABS
25.0000 mg | ORAL_TABLET | Freq: Every day | ORAL | Status: DC
  Administered 2022-09-25 – 2022-09-26 (×2): 25 mg via ORAL
  Filled 2022-09-25 (×2): qty 1

## 2022-09-25 MED ORDER — ENSURE ENLIVE PO LIQD
1.0000 | Freq: Three times a day (TID) | ORAL | Status: DC
  Administered 2022-09-25: 1 via ORAL
  Administered 2022-09-25 – 2022-09-26 (×3): 237 mL via ORAL

## 2022-09-25 NOTE — Progress Notes (Signed)
Pt has stridor sounds coming from upper airway. BBS have good ventilation throughout with no history of asthma or COPD. Neb order adjusted based off of respiratory protocol. DuoNebs not clinically helpful for RSV. Spoke with MD about steroids, pt is currently receiving. RRT will continue to monitor.

## 2022-09-25 NOTE — Progress Notes (Addendum)
   Subjective:  Pt reported a lot of coughing overnight.   This morning pt states she is feeling worse primarily due to coughing all night. No chest pain or other acute concerns voiced.   Objective:  Vital signs in last 24 hours: Vitals:   09/24/22 1432 09/24/22 1532 09/24/22 2057 09/25/22 0417  BP:  122/68  (!) 151/95  Pulse:  88  62  Resp:  18  17  Temp:  98 F (36.7 C)  98.8 F (37.1 C)  TempSrc:  Oral    SpO2: 94% 97% 93% 91%  Weight:      Height:       Physical Exam:  General: NAD HENT: NCAT, no [place] present Pulmonary: Wheezing present more prominent in the upper airways Cardiovascular:  NSR Musculoskeletal: No asymmetry  Skin: no wounds on exposed skin Neurological: alert and oriented x4 Psychiatric: normal mood and normal affect  Assessment/Plan:  Principal Problem:   Acute bronchitis due to respiratory syncytial virus (RSV) Active Problems:   Diabetes type 2, controlled (HCC)   Morbid obesity with BMI of 45.0-49.9, adult (HCC)   Hypokalemia   Acute hypoxic respiratory failure (HCC)   Pulmonary hypertension (HCC)  Allison Caldwell is a 76 y/o female with a past medical history of HTN, HLD, T2DM, anxiety, and depression who presents with 2 days of productive cough and fatigue and was admitted for acute hypoxic respiratory failure.  # Acute hypoxic respiratory failure Improving. Plan to continue treatment inpatient today and plan for discharge tomorrow. Will have OP follow up with plan for PFTs.  -Prednisone '40mg'$  x 5 days (3/5) -Brovana nebulizer BID -Revenacin nebulizer daily -Duonebs q4 hours   # HTN # Hypokalemia # Hypomagnesemia -Continue home olmesartan 40 mg -Continue home diltiazem '420mg'$  daily -Restart HCTZ today   # T2DM A1c 11/24 was 9.8. Home regimen includes metformin '500mg'$  BID. SSI at resistant and pt used 39 units in the last 24 hours. CBG over 200. Will continue rSSI but add 8 units mealtime for better control while on prednisone. Given high A1c  pt will need insulin at discharge. Her hyperglycemia is partly explained by her prednisone but this does not explain the elevated A1c.   -Start 10 units semglee, continue to titrate her insulin based on usage.  -Diabetes coordinator consult placed as pt will need insulin, she will benefit from GLP-1 inhibitor given her obesity.    # Anxiety # Depression Home regimen includes Sertraline '100mg'$  daily and Buspirone '10mg'$  TID. -Continue Sertraline '100mg'$  daily and Buspirone '10mg'$  TID   # HLD Home regimen includes Pravastatin 20 mg. -Continue Pravastatin 20 mg   Diet: Regular diet Bowel: none VTE: Lovenox IVF: none Code: FULL  Prior to Admission Living Arrangement: home Anticipated Discharge Location: Home Barriers to Discharge: continued management Dispo: Anticipated discharge today.   Idamae Schuller, MD 09/25/2022, 6:15 AM Pager: [phone] After 5pm on weekdays and 1pm on weekends: On Call pager [phone]

## 2022-09-25 NOTE — Progress Notes (Signed)
Mobility Specialist Progress Note:   09/25/22 1515  Mobility  Activity Ambulated with assistance in room  Level of Assistance Standby assist, set-up cues, supervision of patient - no hands on  Assistive Device None  Distance Ambulated (ft) 50 ft  Activity Response Tolerated well  Mobility Referral Yes  $Mobility charge 1 Mobility   Pt received sitting EOB and agreeable with encouragement. Deferred further mobility d/t fatigue from previous walk to BR. Pt left sitting EOB with all needs met and call bell in reach.   Andrey Campanile Mobility Specialist Please contact via SecureChat or  Rehab office at [phone]

## 2022-09-26 ENCOUNTER — Other Ambulatory Visit (HOSPITAL_COMMUNITY): Payer: Self-pay

## 2022-09-26 DIAGNOSIS — I272 Pulmonary hypertension, unspecified: Secondary | ICD-10-CM | POA: Diagnosis not present

## 2022-09-26 DIAGNOSIS — Z1152 Encounter for screening for COVID-19: Secondary | ICD-10-CM | POA: Diagnosis not present

## 2022-09-26 DIAGNOSIS — E118 Type 2 diabetes mellitus with unspecified complications: Secondary | ICD-10-CM

## 2022-09-26 DIAGNOSIS — J205 Acute bronchitis due to respiratory syncytial virus: Secondary | ICD-10-CM | POA: Diagnosis not present

## 2022-09-26 DIAGNOSIS — J9601 Acute respiratory failure with hypoxia: Secondary | ICD-10-CM | POA: Diagnosis not present

## 2022-09-26 DIAGNOSIS — E119 Type 2 diabetes mellitus without complications: Secondary | ICD-10-CM | POA: Diagnosis not present

## 2022-09-26 DIAGNOSIS — R2689 Other abnormalities of gait and mobility: Secondary | ICD-10-CM | POA: Diagnosis not present

## 2022-09-26 DIAGNOSIS — Z7984 Long term (current) use of oral hypoglycemic drugs: Secondary | ICD-10-CM | POA: Diagnosis not present

## 2022-09-26 DIAGNOSIS — R0602 Shortness of breath: Secondary | ICD-10-CM | POA: Diagnosis not present

## 2022-09-26 DIAGNOSIS — R062 Wheezing: Secondary | ICD-10-CM | POA: Diagnosis not present

## 2022-09-26 DIAGNOSIS — Z6841 Body Mass Index (BMI) 40.0 and over, adult: Secondary | ICD-10-CM | POA: Diagnosis not present

## 2022-09-26 LAB — GLUCOSE, CAPILLARY
Glucose-Capillary: 230 mg/dL — ABNORMAL HIGH (ref 70–99)
Glucose-Capillary: 166 mg/dL — ABNORMAL HIGH (ref 70–99)

## 2022-09-26 MED ORDER — ALBUTEROL SULFATE HFA 108 (90 BASE) MCG/ACT IN AERS
2.0000 | INHALATION_SPRAY | Freq: Four times a day (QID) | RESPIRATORY_TRACT | 2 refills | Status: DC | PRN
  Filled 2022-09-26: qty 18, 25d supply, fill #0

## 2022-09-26 MED ORDER — GLUCOSE BLOOD VI STRP
ORAL_STRIP | 0 refills | Status: AC
  Filled 2022-09-26: qty 100, 30d supply, fill #0

## 2022-09-26 MED ORDER — INSULIN GLARGINE 100 UNIT/ML SOLOSTAR PEN
PEN_INJECTOR | SUBCUTANEOUS | 0 refills | Status: DC
  Filled 2022-09-26: qty 3, 30d supply, fill #0

## 2022-09-26 MED ORDER — NOVOLOG FLEXPEN 100 UNIT/ML ~~LOC~~ SOPN
4.0000 [IU] | PEN_INJECTOR | Freq: Three times a day (TID) | SUBCUTANEOUS | 0 refills | Status: DC
  Filled 2022-09-26: qty 6, 30d supply, fill #0

## 2022-09-26 MED ORDER — BLOOD GLUCOSE MONITOR KIT
PACK | 0 refills | Status: AC
  Filled 2022-09-26: qty 1, 30d supply, fill #0

## 2022-09-26 MED ORDER — INSULIN PEN NEEDLE 31G X 8 MM MISC
1.0000 | Freq: Four times a day (QID) | 1 refills | Status: AC
  Filled 2022-09-26: qty 100, 30d supply, fill #0

## 2022-09-26 MED ORDER — TRELEGY ELLIPTA 100-62.5-25 MCG/ACT IN AEPB
1.0000 | INHALATION_SPRAY | Freq: Every day | RESPIRATORY_TRACT | 1 refills | Status: AC
  Filled 2022-09-26: qty 60, 30d supply, fill #0

## 2022-09-26 MED ORDER — ENOXAPARIN SODIUM 60 MG/0.6ML IJ SOSY
60.0000 mg | PREFILLED_SYRINGE | INTRAMUSCULAR | Status: DC
  Administered 2022-09-26: 60 mg via SUBCUTANEOUS
  Filled 2022-09-26: qty 0.6

## 2022-09-26 MED ORDER — PREDNISONE 20 MG PO TABS
40.0000 mg | ORAL_TABLET | Freq: Every day | ORAL | 0 refills | Status: DC
  Filled 2022-09-26: qty 2, 1d supply, fill #0

## 2022-09-26 MED ORDER — CHLORTHALIDONE 25 MG PO TABS
25.0000 mg | ORAL_TABLET | Freq: Every day | ORAL | 0 refills | Status: DC
  Filled 2022-09-26: qty 30, 30d supply, fill #0

## 2022-09-26 MED ORDER — INSULIN STARTER KIT- PEN NEEDLES (ENGLISH)
1.0000 | Freq: Once | Status: AC
  Administered 2022-09-26: 1
  Filled 2022-09-26: qty 1

## 2022-09-26 MED ORDER — ACCU-CHEK SOFTCLIX LANCETS MISC
0 refills | Status: AC
  Filled 2022-09-26: qty 100, 30d supply, fill #0

## 2022-09-26 NOTE — Inpatient Diabetes Management (Addendum)
Inpatient Diabetes Program Recommendations  AACE/ADA: New Consensus Statement on Inpatient Glycemic Control   Target Ranges:  Prepandial:   less than 140 mg/dL      Peak postprandial:   less than 180 mg/dL (1-2 hours)      Critically ill patients:  140 - 180 mg/dL    Latest Reference Range & Units 09/25/22 08:10 09/25/22 12:05 09/25/22 15:53 09/25/22 19:46  Glucose-Capillary 70 - 99 mg/dL 241 (H) 78 489 (H) 238 (H)    Latest Reference Range & Units 09/23/22 04:34  Hemoglobin A1C 4.8 - 5.6 % 9.8 (H)   Review of Glycemic Control  Diabetes history: DM2 Outpatient Diabetes medications: Metformin 500 mg BID Current orders for Inpatient glycemic control: Semglee 10 units QHS, Novolog 8 units TID with meals, Novolog 0-20 units TID wit meals; Prednisone 40 mg/dl daily  Inpatient Diabetes Program Recommendations:    HbgA1C:  A1C 9.8% on 09/23/22 indicating an average glucose of 235 mg/dl over the past 2-3 months.  Outpatient DM medications: At time of discharge, please discharge on Lantus 10 units QHS and Novolog 4 units TID with meals (along with Metformin as already taking outpatient).  Please provide Rx for Lantus vials (#[id number]), Novolog vials (#[id number]), insulin syringes (#[id number]), and glucose monitoring kit (#[id number])  NOTE: Noted consult for diabetes coordinator for insulin teaching. Ordered insulin starter kit. Will plan to see patient today.  Addendum 09/26/22-Spoke with patient at bedside regarding DM control. Patient reports that she was taking Metformin 500 mg BID and she does not check glucose. Patient reports she has had DM for over 20 years and she took insulin in the past when she was first diagnosed but was able to come off insulin and just use Metformin for DM control. Patient states that her last A1C was in 6% range.  Discussed A1C results (9.8% on 09/23/22) and explained that current A1C indicates an average glucose of 235 mg/dl over the past 2-3 months. Discussed glucose and  A1C goals. Discussed importance of checking CBGs and maintaining good CBG control to prevent long-term and short-term complications. Explained how hyperglycemia leads to damage within blood vessels which lead to the common complications seen with uncontrolled diabetes. Stressed to the patient the importance of improving glycemic control to prevent further complications from uncontrolled diabetes. Discussed impact of nutrition, exercise, stress, sickness, and medications on diabetes control.  Discussed carbohydrates, carbohydrate goals per day and meal, along with portion sizes. Patient admits that she "eat whatever I want in moderation" and drinks mainly water, cranberry juice and apple juice. Discussed eliminating sugary beverages and encouraged patient to drink mainly water and to use diet juices. Patient states that she used vial/syringe when she took insulin in the past. Patient reports that she does not want to use insulin again and she does not want to take steroids because she knows that will make her sugar go up. Discussed need for steroids short term and that insulin will be prescribed to help with hyperglycemia and due to elevated A1C. Discussed glucose monitoring, glucose goals; reviewed hypoglycemia along with treatment. Patient states she will be willing to take insulin if it is short term. Discussed long acting and short acting insulin. Patient states that she took short acting insulin in the past. Discussed insulin administration, injection sites, importance of rotating insulin injection sites. Reviewed and demonstrated how to use insulin pens and vial/syringe. Patient states she would prefer to use vial/syringe since she knows how to do that and did not want to demonstrate how  to use vial/syringe. Patient's oldest son at bedside and stated "She knows how to do that. She doesn't need to practice."   Discussed that once steroids are tapered that she may need to have DM medications adjusted. Asked  patient to be sure to call PCP if she has any issues at all with hypoglycemia so she can be advised on any needed adjustments. Patient encouraged to carry glucose tablets in her purse and have readily available at home to use in event she has hypoglycemia.  Patient verbalized understanding of information discussed and reports no further questions at this time related to diabetes.  Thanks, Barnie Alderman, RN, MSN, [place] Diabetes Coordinator Inpatient Diabetes Program [phone] (Team Pager from 8am to [place])

## 2022-09-26 NOTE — Discharge Summary (Addendum)
Name: Allison Caldwell MRN: [id number] DOB: 13-Dec-1945 76 y.o. PCP: Angelica Pou, MD  Date of Admission: 09/22/2022  7:22 PM Date of Discharge: 09/26/2022 Attending Physician: Campbell Riches, MD  Discharge Diagnosis: 1. Principal Problem:   Acute bronchitis due to respiratory syncytial virus (RSV) Active Problems:   Diabetes type 2, controlled ([place])   Morbid obesity with BMI of 45.0-49.9, adult (HCC)   Hypokalemia   Acute hypoxic respiratory failure (HCC)   Pulmonary hypertension (HCC) Hyperlipidemia  Hypomagnesemia Anxiety Depression  Discharge Medications: Allergies as of 09/26/2022       Reactions   Ace Inhibitors Other (See Comments)    cough   Penicillins Other (See Comments)   Muscle spasms, Has patient had a PCN reaction causing immediate rash, facial/tongue/throat swelling, SOB or lightheadedness with hypotension: Yes Has patient had a PCN reaction causing severe rash involving mucus membranes or skin necrosis: No Has patient had a PCN reaction that required hospitalization No Has patient had a PCN reaction occurring within the last 10 years: Yes If all of the above answers are "NO", then may proceed with Cephalosporin use.        Medication List     STOP taking these medications    hydrochlorothiazide 25 MG tablet Commonly known as: HYDRODIURIL       TAKE these medications    Accu-Chek FastClix Lancets Misc Check blood sugar 1 time a day   Accu-Chek Guide test strip Generic drug: glucose blood Check blood sugar 1 time per day   Accu-Chek Guide w/Device Kit 1 each by Does not apply route daily. Check blood sugar 1 time a day   albuterol 108 (90 Base) MCG/ACT inhaler Commonly known as: VENTOLIN HFA Inhale 2 puffs into the lungs every 6 (six) hours as needed for wheezing or shortness of breath.   aspirin EC 81 MG tablet Take 1 tablet (81 mg total) by mouth every morning.   blood glucose meter kit and supplies Kit Dispense  based on patient and insurance preference. Use up to four times daily as directed.   busPIRone 10 MG tablet Commonly known as: BUSPAR Take 1 tablet (10 mg total) by mouth 3 (three) times daily. What changed: when to take this   chlorthalidone 25 MG tablet Commonly known as: HYGROTON Take 1 tablet (25 mg total) by mouth daily.   insulin aspart 100 UNIT/ML injection Commonly known as: NovoLOG Inject 4 Units into the skin 3 (three) times daily before meals.   insulin glargine 100 UNIT/ML injection Commonly known as: LANTUS Please inject 10 units at bedtime daily   Insulin Syringe/Needle U-500 31G X 6MM 0.5 ML Misc 1 Syringe by Does not apply route 4 (four) times daily.   metFORMIN 500 MG tablet Commonly known as: GLUCOPHAGE Take 1 tablet (500 mg total) by mouth 2 (two) times daily with a meal.   multivitamin with minerals Tabs tablet Take 1 tablet by mouth daily.   olmesartan 40 MG tablet Commonly known as: BENICAR TAKE 1 TABLET AT BEDTIME What changed: when to take this   pravastatin 20 MG tablet Commonly known as: PRAVACHOL TAKE 1 TABLET AT BEDTIME What changed: when to take this   predniSONE 20 MG tablet Commonly known as: DELTASONE Take 2 tablets (40 mg total) by mouth daily with breakfast. Start taking on: September 27, 2022   sertraline 100 MG tablet Commonly known as: ZOLOFT Take 1 tablet (100 mg total) by mouth daily.   Tiadylt ER 420 MG 24 hr  capsule Generic drug: diltiazem TAKE 1 CAPSULE EVERY DAY What changed: how much to take   Trelegy Ellipta 100-62.5-25 MCG/ACT Aepb Generic drug: Fluticasone-Umeclidin-Vilant Inhale 1 puff into the lungs daily.               Durable Medical Equipment  (From admission, onward)           Start     Ordered   09/26/22 1318  DME Walker  Once       Question Answer Comment  Walker: With [place] Wheels   Patient needs a walker to treat with the following condition Physical deconditioning      09/26/22 1318             Disposition and follow-up:   [name] was discharged from [facility] in Good condition.  At the hospital follow up visit please address:  1.    A. Acute Hypoxemic Respiratory Failure  - Started on albuterol, trelegy, and 1 day of prednisone  - Will need pulmonology referral for PFTs    B. Type 2 Diabetes  - Started on insulin  - Consider starting semaglutide   2.  Labs / imaging needed at time of follow-up: BMP  3.  Pending labs/ test needing follow-up: none  Follow-up Appointments: [facility] 09/29/2022 at 2:45 [facility] Course by problem list:  Allison Caldwell is a 76 y/o female with a past medical history of HTN, HLD, T2DM, anxiety, and depression who presents with 2 days of productive cough and fatigue and was admitted for acute hypoxic respiratory failure.   # Acute hypoxic respiratory failure Initially presented with wheezing, increased work of breathing, and oxygen requirements.  She was found to be positive with RSV.  Suspect that RSV is causing exacerbation of undiagnosed underlying obstructive pulmonary disease.  Patient was weaned off of oxygen by the time of discharge. Her respiratory status continued to improve with prednisone, Brovana, Revenacin, and DuoNebs. Patient will go home on albuterol, Trelegy, and prednisone. Patient will follow-up with our internal medicine clinic. Recommend that the patient has referral to pulmonology for PFTs. Patient will also require outpatient workup for pulmonary hypertension noted on CT and potential OSA.   # HTN # Hypokalemia # Hypomagnesemia Initially developed hypokalemia and hypomagnesemia.  Suspected to be secondary to hydrochlorothiazide which was initially held. Hypokalemia and hypomagnesemia resolved.  Hydrochlorothiazide was restarted. Patient was discharged on her home regimen of olmesartan and diltiazem. Hydrochlorothiazide was discontinued upon  discharge and Chlorthalidone was started for better control of her blood pressure. Patient will require BMP to assess electrolytes and renal function at her follow-up appointment in our clinic.     # T2DM A1c 11/24 was 9.8.  Home regimen prior to admission was metformin 500 mg twice daily.  Patient was noted to have CBG values in the 200s-400s throughout her hospital stay requiring increased amounts of insulin.  Suspect this was partially due to steroid use however this would not explain her elevated A1c.  Patient was discharged on Novolog 4 units TID with meals and Lantus 10 units at bedtime. Patient will need follow-up of her insulin regimen in clinic. Can consider starting semaglutide in clinic.     # Anxiety # Depression Home regimen prior to admission was sertraline 156m daily and Buspirone 159mTID. Continued these medications throughout her hospitalization and sent her home on the same regimen.   # HLD Home regimen prior to admission included Pravastatin  20 mg.  Continue this medication throughout her hospitalization and sent her home on the same regimen.  # Disposition Patient was assessed by PT/OT. She was sent home with DME walker to help with mobility around the house.   Discharge Exam:   BP (!) 160/95 (BP Location: Left Arm)   Pulse 77   Temp 98.6 F (37 C) (Oral)   Resp 17   Ht 5' 4" (1.626 m)   Wt 126.5 kg   SpO2 96%   BMI 47.87 kg/m   General: Actively coughing Pulmonary: normal work of breathing, diffuse transmitted lung sounds, no wheezing, no crackles  Cardiovascular: Irregularly irregular rhythm, normal rate. No murmurs, rubs, or gallops.  Musculoskeletal: Normal range of motion. Trace lower extremity edema bilaterally. Skin: Warm and dry Neurological: Alert and oriented to person, place, and time. No focal deficit.  Psychiatric: Normal behavior.   Pertinent Labs, Studies, and Procedures:     Latest Ref Rng & Units 09/23/2022    4:34 AM 09/22/2022    7:55  PM 09/22/2022    7:44 PM  CBC  WBC 4.0 - 10.5 K/uL 6.4   7.1   Hemoglobin 12.0 - 15.0 g/dL 13.2  15.0  13.8   Hematocrit 36.0 - 46.0 % 40.0  44.0  42.8   Platelets 150 - 400 K/uL 136   157        Latest Ref Rng & Units 09/25/2022    7:49 AM 09/24/2022    4:17 AM 09/23/2022    4:34 AM  BMP  Glucose 70 - 99 mg/dL 219  161  208   BUN 8 - 23 mg/dL 14  8  <5   Creatinine 0.44 - 1.00 mg/dL 0.63  0.62  0.67   Sodium 135 - 145 mmol/L 135  138  138   Potassium 3.5 - 5.1 mmol/L 3.7  3.5  3.2   Chloride 98 - 111 mmol/L 95  95  93   CO2 22 - 32 mmol/L _0 Calcium 8.9 - 10.3 mg/dL 8.7  8.7  8.7    DG Chest   IMPRESSION: Cardiomegaly with no active disease.  CT Angio Chest PE  IMPRESSION: No evidence of pulmonary embolus.   Markedly dilated main pulmonary artery most compatible with pulmonary arterial hypertension.   No acute cardiopulmonary disease.   Aortic Atherosclerosis (ICD10-I70.0).  Discharge Instructions:  You were hospitalized for acute respiratory failure likely due to RSV infection.     Hospital Course: You were found to be positive for RSV.  It is likely that this virus caused your breathing to worsen. This is a virus that your body will get rid of on its own. You do not need antibiotics. We gave you inhalers, breathing treatments, and steroids to help with your breathing.  ou will go home with new medications to help with your breathing. You will follow-up with our internal medicine clinic regarding your new medications. Our clinic will refer you to pulmonology to have lung testing performed. We also added insulin to your diabetes medications. We will discuss your insulin regimen at your clinic appointment. We are switching your high blood pressure medication Hydrochlorothiazide to Chlorthalidone for better control of your blood pressure. Please check your blood pressure at home and bring record of these readings to your follow up appointment. We also sent you home  with a walker to help with getting around the house.      Medications:   Please start taking: -  Albuterol inhaler (2 puffs every 6 hours as needed) for difficulty breathing or wheezing -Trelegy inhaler (1 puff each day) for difficulty breathing or wheezing -Prednisone 20 mg tablet (2 tablets for 1 day) for your breathing -Novolog (inject 4 units with meals 3 times daily) for your diabetes -Lantus (inject 10 units at bedtime) for your diabetes -Insulin Syringe (use these to inject your insulin) -Blood glucose meter kit and supplies (please use the lancets to draw blood and the test strips to test your blood sugar with the glucose meter) -Chlorthalidone 25 mg for high blood pressure  Please stop taking: -Hydrochlorothiazide 25 mg tablet   Please continue taking: -Aspirin 81 mg tablet -Metformin 500 mg tablet -Olmesartan 40 mg tablet -Tiadylt 420 mg capsule -Pravastatin 20 mg tablet -Sertraline 100 mg tablet -BuSpar 10 mg tablet -Multivitamin   Follow-up: - Please follow up with your primary care provider Dr. Jimmye Norman at the [facility] on 09/29/2022 at 2:45 PM  Phone: [phone] Address: [facility], [place], [place], [place] [address]  Signed: Starlyn Skeans, MD 09/26/2022, 11:36 AM   Pager: [phone]

## 2022-09-26 NOTE — TOC Benefit Eligibility Note (Signed)
Patient Teacher, English as a foreign language completed.    The patient is currently admitted and upon discharge could be taking Levemir.  The current 30 day co-pay is $0.00.    Patient Teacher, English as a foreign language completed.    The patient is currently admitted and upon discharge could be taking Lantus.  The current 30 day co-pay is $0.00.     Patient Teacher, English as a foreign language completed.    The patient is currently admitted and upon discharge could be taking Novolin-R.  The current 30 day co-pay is $0.00.   The patient is insured through [employer]

## 2022-09-26 NOTE — Discharge Instructions (Addendum)
You were hospitalized for acute respiratory failure likely due to RSV infection.     Hospital Course: You were found to be positive for RSV.  It is likely that this virus caused your breathing to worsen. This is a virus that your body will get rid of on its own. You do not need antibiotics. We gave you inhalers, breathing treatments, and steroids to help with your breathing.  ou will go home with new medications to help with your breathing. You will follow-up with our internal medicine clinic regarding your new medications. Our clinic will refer you to pulmonology to have lung testing performed. We also added insulin to your diabetes medications. We will discuss your insulin regimen at your clinic appointment. We are switching your high blood pressure medication Hydrochlorothiazide to Chlorthalidone for better control of your blood pressure. Please check your blood pressure at home and bring record of these readings to your follow up appointment. We also sent you home with a walker to help with getting around the house.      Medications:   Please start taking: -Albuterol inhaler (2 puffs every 6 hours as needed) for difficulty breathing or wheezing -Trelegy inhaler (1 puff each day) for difficulty breathing or wheezing -Prednisone 20 mg tablet (2 tablets for 1 day) for your breathing -Novolog (inject 4 units with meals 3 times daily) for your diabetes -Lantus (inject 10 units at bedtime) for your diabetes -Insulin Syringe (use these to inject your insulin) -Blood glucose meter kit and supplies (please use the lancets to draw blood and the test strips to test your blood sugar with the glucose meter) -Chlorthalidone 25 mg for high blood pressure   Please stop taking: -Hydrochlorothiazide 25 mg tablet   Please continue taking: -Aspirin 81 mg tablet -Metformin 500 mg tablet -Olmesartan 40 mg tablet -Tiadylt 420 mg capsule -Pravastatin 20 mg tablet -Sertraline 100 mg tablet -BuSpar 10 mg  tablet -Multivitamin   Follow-up: - Please follow up with your primary care provider Dr. Jimmye Norman at the [facility] on 09/29/2022 at 2:45 PM  Phone: [phone] Address: [facility], [place] [address], [place], [place] [address]

## 2022-09-26 NOTE — Plan of Care (Signed)
0700: Pt resting in bed at beginning of shift, pt alert and oriented x4, pt on room air, pt bed in lowest position, and call bell within reach.  0900: Pt sitting on edge of bed eating breakfast, pt given IV starter kit by RN, that was ordered by provider. Pt possible discharge today, pt aware. Pt requesting to RN if pt does get discharged pt was RN to call her son.

## 2022-09-27 ENCOUNTER — Telehealth: Payer: Self-pay

## 2022-09-27 NOTE — Patient Outreach (Signed)
  Care Coordination [facility] Note Transition Care Management Unsuccessful Follow-up Telephone Call  Date of discharge and from where[facility] 09[facility]  Attempts:  1st Attempt  Reason for unsuccessful TCM follow-up call:  No answer/busy  Johnney Killian, RN, BSN, CCM Care Management Coordinator [facility] Phone: [phone]: [phone]

## 2022-09-29 ENCOUNTER — Encounter: Payer: Medicare HMO | Admitting: Student

## 2023-02-01 ENCOUNTER — Other Ambulatory Visit: Payer: Self-pay | Admitting: Student

## 2023-02-01 DIAGNOSIS — I152 Hypertension secondary to endocrine disorders: Secondary | ICD-10-CM

## 2023-02-01 DIAGNOSIS — I10 Essential (primary) hypertension: Secondary | ICD-10-CM

## 2023-02-02 NOTE — Telephone Encounter (Signed)
LOV was 05/2021. I called pt to schedule an appt. Pt stated she has been having trouble with transportation not showing up. Stated she's too old to walk here (stated she did this once) . And she ate salmon last night and now she's having trouble with gout unable to talk. So she will call back to schedule an appt.

## 2023-03-06 ENCOUNTER — Encounter: Payer: Medicare HMO | Admitting: Student

## 2023-03-25 ENCOUNTER — Emergency Department (HOSPITAL_COMMUNITY): Payer: Medicare HMO

## 2023-03-25 ENCOUNTER — Inpatient Hospital Stay (HOSPITAL_COMMUNITY): Payer: Medicare HMO

## 2023-03-25 ENCOUNTER — Other Ambulatory Visit: Payer: Self-pay

## 2023-03-25 ENCOUNTER — Other Ambulatory Visit (HOSPITAL_COMMUNITY): Payer: Medicare HMO

## 2023-03-25 ENCOUNTER — Inpatient Hospital Stay (HOSPITAL_COMMUNITY)
Admission: EM | Admit: 2023-03-25 | Discharge: 2023-04-10 | DRG: 064 | Disposition: A | Discharge status: Skilled Nursing Facility | Payer: Medicare HMO | Attending: Internal Medicine | Admitting: Internal Medicine

## 2023-03-25 DIAGNOSIS — G40919 Epilepsy, unspecified, intractable, without status epilepticus: Secondary | ICD-10-CM | POA: Diagnosis not present

## 2023-03-25 DIAGNOSIS — R29810 Facial weakness: Secondary | ICD-10-CM | POA: Diagnosis present

## 2023-03-25 DIAGNOSIS — I619 Nontraumatic intracerebral hemorrhage, unspecified: Secondary | ICD-10-CM | POA: Diagnosis not present

## 2023-03-25 DIAGNOSIS — I63411 Cerebral infarction due to embolism of right middle cerebral artery: Secondary | ICD-10-CM | POA: Diagnosis not present

## 2023-03-25 DIAGNOSIS — R29704 NIHSS score 4: Secondary | ICD-10-CM | POA: Diagnosis present

## 2023-03-25 DIAGNOSIS — S199XXA Unspecified injury of neck, initial encounter: Secondary | ICD-10-CM | POA: Diagnosis not present

## 2023-03-25 DIAGNOSIS — S0993XA Unspecified injury of face, initial encounter: Secondary | ICD-10-CM | POA: Diagnosis not present

## 2023-03-25 DIAGNOSIS — Z7982 Long term (current) use of aspirin: Secondary | ICD-10-CM

## 2023-03-25 DIAGNOSIS — Z4659 Encounter for fitting and adjustment of other gastrointestinal appliance and device: Secondary | ICD-10-CM | POA: Diagnosis not present

## 2023-03-25 DIAGNOSIS — G40101 Localization-related (focal) (partial) symptomatic epilepsy and epileptic syndromes with simple partial seizures, not intractable, with status epilepticus: Secondary | ICD-10-CM | POA: Diagnosis present

## 2023-03-25 DIAGNOSIS — E118 Type 2 diabetes mellitus with unspecified complications: Secondary | ICD-10-CM | POA: Diagnosis present

## 2023-03-25 DIAGNOSIS — I69354 Hemiplegia and hemiparesis following cerebral infarction affecting left non-dominant side: Secondary | ICD-10-CM | POA: Diagnosis not present

## 2023-03-25 DIAGNOSIS — M79632 Pain in left forearm: Secondary | ICD-10-CM | POA: Diagnosis not present

## 2023-03-25 DIAGNOSIS — Z79899 Other long term (current) drug therapy: Secondary | ICD-10-CM

## 2023-03-25 DIAGNOSIS — W19XXXA Unspecified fall, initial encounter: Secondary | ICD-10-CM | POA: Diagnosis not present

## 2023-03-25 DIAGNOSIS — R253 Fasciculation: Secondary | ICD-10-CM | POA: Diagnosis present

## 2023-03-25 DIAGNOSIS — R569 Unspecified convulsions: Secondary | ICD-10-CM

## 2023-03-25 DIAGNOSIS — I480 Paroxysmal atrial fibrillation: Secondary | ICD-10-CM | POA: Diagnosis present

## 2023-03-25 DIAGNOSIS — F05 Delirium due to known physiological condition: Secondary | ICD-10-CM | POA: Diagnosis not present

## 2023-03-25 DIAGNOSIS — E782 Mixed hyperlipidemia: Secondary | ICD-10-CM | POA: Diagnosis present

## 2023-03-25 DIAGNOSIS — E1169 Type 2 diabetes mellitus with other specified complication: Secondary | ICD-10-CM | POA: Diagnosis present

## 2023-03-25 DIAGNOSIS — W06XXXA Fall from bed, initial encounter: Secondary | ICD-10-CM | POA: Diagnosis present

## 2023-03-25 DIAGNOSIS — G936 Cerebral edema: Secondary | ICD-10-CM | POA: Diagnosis not present

## 2023-03-25 DIAGNOSIS — M6281 Muscle weakness (generalized): Secondary | ICD-10-CM | POA: Diagnosis not present

## 2023-03-25 DIAGNOSIS — Z880 Allergy status to penicillin: Secondary | ICD-10-CM

## 2023-03-25 DIAGNOSIS — I10 Essential (primary) hypertension: Secondary | ICD-10-CM | POA: Diagnosis not present

## 2023-03-25 DIAGNOSIS — I639 Cerebral infarction, unspecified: Secondary | ICD-10-CM | POA: Diagnosis not present

## 2023-03-25 DIAGNOSIS — R1310 Dysphagia, unspecified: Secondary | ICD-10-CM | POA: Diagnosis not present

## 2023-03-25 DIAGNOSIS — E1159 Type 2 diabetes mellitus with other circulatory complications: Secondary | ICD-10-CM | POA: Diagnosis present

## 2023-03-25 DIAGNOSIS — I611 Nontraumatic intracerebral hemorrhage in hemisphere, cortical: Secondary | ICD-10-CM | POA: Diagnosis not present

## 2023-03-25 DIAGNOSIS — R0989 Other specified symptoms and signs involving the circulatory and respiratory systems: Secondary | ICD-10-CM | POA: Diagnosis not present

## 2023-03-25 DIAGNOSIS — E876 Hypokalemia: Secondary | ICD-10-CM | POA: Diagnosis present

## 2023-03-25 DIAGNOSIS — S0990XA Unspecified injury of head, initial encounter: Secondary | ICD-10-CM | POA: Diagnosis not present

## 2023-03-25 DIAGNOSIS — R410 Disorientation, unspecified: Secondary | ICD-10-CM | POA: Diagnosis not present

## 2023-03-25 DIAGNOSIS — E871 Hypo-osmolality and hyponatremia: Secondary | ICD-10-CM | POA: Diagnosis not present

## 2023-03-25 DIAGNOSIS — I152 Hypertension secondary to endocrine disorders: Secondary | ICD-10-CM | POA: Diagnosis present

## 2023-03-25 DIAGNOSIS — Z888 Allergy status to other drugs, medicaments and biological substances status: Secondary | ICD-10-CM

## 2023-03-25 DIAGNOSIS — R1312 Dysphagia, oropharyngeal phase: Secondary | ICD-10-CM | POA: Diagnosis not present

## 2023-03-25 DIAGNOSIS — R609 Edema, unspecified: Secondary | ICD-10-CM | POA: Diagnosis not present

## 2023-03-25 DIAGNOSIS — E11649 Type 2 diabetes mellitus with hypoglycemia without coma: Secondary | ICD-10-CM | POA: Diagnosis not present

## 2023-03-25 DIAGNOSIS — E785 Hyperlipidemia, unspecified: Secondary | ICD-10-CM | POA: Diagnosis not present

## 2023-03-25 DIAGNOSIS — Z833 Family history of diabetes mellitus: Secondary | ICD-10-CM

## 2023-03-25 DIAGNOSIS — E1165 Type 2 diabetes mellitus with hyperglycemia: Secondary | ICD-10-CM | POA: Diagnosis not present

## 2023-03-25 DIAGNOSIS — I4891 Unspecified atrial fibrillation: Secondary | ICD-10-CM | POA: Diagnosis not present

## 2023-03-25 DIAGNOSIS — W2203XA Walked into furniture, initial encounter: Secondary | ICD-10-CM | POA: Diagnosis not present

## 2023-03-25 DIAGNOSIS — Z6841 Body Mass Index (BMI) 40.0 and over, adult: Secondary | ICD-10-CM | POA: Diagnosis not present

## 2023-03-25 DIAGNOSIS — I63421 Cerebral infarction due to embolism of right anterior cerebral artery: Secondary | ICD-10-CM | POA: Diagnosis present

## 2023-03-25 DIAGNOSIS — Z23 Encounter for immunization: Secondary | ICD-10-CM

## 2023-03-25 DIAGNOSIS — Z841 Family history of disorders of kidney and ureter: Secondary | ICD-10-CM

## 2023-03-25 DIAGNOSIS — Z794 Long term (current) use of insulin: Secondary | ICD-10-CM | POA: Diagnosis not present

## 2023-03-25 DIAGNOSIS — Z7401 Bed confinement status: Secondary | ICD-10-CM | POA: Diagnosis not present

## 2023-03-25 DIAGNOSIS — Z7984 Long term (current) use of oral hypoglycemic drugs: Secondary | ICD-10-CM

## 2023-03-25 DIAGNOSIS — F32A Depression, unspecified: Secondary | ICD-10-CM | POA: Diagnosis present

## 2023-03-25 DIAGNOSIS — R471 Dysarthria and anarthria: Secondary | ICD-10-CM | POA: Diagnosis present

## 2023-03-25 DIAGNOSIS — Z043 Encounter for examination and observation following other accident: Secondary | ICD-10-CM | POA: Diagnosis not present

## 2023-03-25 DIAGNOSIS — Z87891 Personal history of nicotine dependence: Secondary | ICD-10-CM

## 2023-03-25 DIAGNOSIS — I272 Pulmonary hypertension, unspecified: Secondary | ICD-10-CM | POA: Diagnosis present

## 2023-03-25 DIAGNOSIS — G319 Degenerative disease of nervous system, unspecified: Secondary | ICD-10-CM | POA: Diagnosis not present

## 2023-03-25 DIAGNOSIS — G8194 Hemiplegia, unspecified affecting left nondominant side: Secondary | ICD-10-CM | POA: Diagnosis not present

## 2023-03-25 DIAGNOSIS — Z823 Family history of stroke: Secondary | ICD-10-CM

## 2023-03-25 DIAGNOSIS — I429 Cardiomyopathy, unspecified: Secondary | ICD-10-CM | POA: Diagnosis present

## 2023-03-25 DIAGNOSIS — R451 Restlessness and agitation: Secondary | ICD-10-CM | POA: Diagnosis not present

## 2023-03-25 DIAGNOSIS — R2681 Unsteadiness on feet: Secondary | ICD-10-CM | POA: Diagnosis not present

## 2023-03-25 DIAGNOSIS — I69391 Dysphagia following cerebral infarction: Secondary | ICD-10-CM | POA: Diagnosis not present

## 2023-03-25 DIAGNOSIS — R531 Weakness: Secondary | ICD-10-CM | POA: Diagnosis not present

## 2023-03-25 DIAGNOSIS — Z781 Physical restraint status: Secondary | ICD-10-CM

## 2023-03-25 DIAGNOSIS — Z8249 Family history of ischemic heart disease and other diseases of the circulatory system: Secondary | ICD-10-CM

## 2023-03-25 DIAGNOSIS — Z8673 Personal history of transient ischemic attack (TIA), and cerebral infarction without residual deficits: Secondary | ICD-10-CM

## 2023-03-25 DIAGNOSIS — J9601 Acute respiratory failure with hypoxia: Secondary | ICD-10-CM | POA: Diagnosis not present

## 2023-03-25 DIAGNOSIS — E119 Type 2 diabetes mellitus without complications: Secondary | ICD-10-CM | POA: Diagnosis not present

## 2023-03-25 DIAGNOSIS — I6389 Other cerebral infarction: Secondary | ICD-10-CM | POA: Diagnosis not present

## 2023-03-25 HISTORY — DX: Morbid (severe) obesity due to excess calories: E66.01

## 2023-03-25 LAB — HEPATIC FUNCTION PANEL
ALT: 13 U/L (ref 0–44)
AST: 22 U/L (ref 15–41)
Albumin: 3.8 g/dL (ref 3.5–5.0)
Alkaline Phosphatase: 41 U/L (ref 38–126)
Bilirubin, Direct: 0.3 mg/dL — ABNORMAL HIGH (ref 0.0–0.2)
Indirect Bilirubin: 0.9 mg/dL (ref 0.3–0.9)
Total Bilirubin: 1.2 mg/dL (ref 0.3–1.2)
Total Protein: 7.2 g/dL (ref 6.5–8.1)

## 2023-03-25 LAB — GLUCOSE, CAPILLARY
Glucose-Capillary: 112 mg/dL — ABNORMAL HIGH (ref 70–99)
Glucose-Capillary: 88 mg/dL (ref 70–99)
Glucose-Capillary: 102 mg/dL — ABNORMAL HIGH (ref 70–99)

## 2023-03-25 LAB — ETHANOL: Alcohol, Ethyl (B): <10 mg/dL (ref <10)

## 2023-03-25 LAB — MRSA NEXT GEN BY PCR, NASAL: MRSA by PCR Next Gen: NOT DETECTED

## 2023-03-25 LAB — URINALYSIS, ROUTINE W REFLEX MICROSCOPIC
Bilirubin Urine: NEGATIVE
Glucose, UA: NEGATIVE mg/dL
Hgb urine dipstick: NEGATIVE
Ketones, ur: NEGATIVE mg/dL
Leukocytes,Ua: NEGATIVE
Nitrite: NEGATIVE
Protein, ur: NEGATIVE mg/dL
Specific Gravity, Urine: 1.004 — ABNORMAL LOW (ref 1.005–1.030)
pH: 8 (ref 5.0–8.0)

## 2023-03-25 LAB — BASIC METABOLIC PANEL
Anion gap: 15 (ref 5–15)
BUN: 6 mg/dL — ABNORMAL LOW (ref 8–23)
CO2: 29 mmol/L (ref 22–32)
Calcium: 9.3 mg/dL (ref 8.9–10.3)
Chloride: 91 mmol/L — ABNORMAL LOW (ref 98–111)
Creatinine, Ser: 0.61 mg/dL (ref 0.44–1.00)
GFR, Estimated: >60 mL/min (ref >60)
Glucose, Bld: 169 mg/dL — ABNORMAL HIGH (ref 70–99)
Potassium: 2.9 mmol/L — ABNORMAL LOW (ref 3.5–5.1)
Sodium: 135 mmol/L (ref 135–145)

## 2023-03-25 LAB — PHOSPHORUS: Phosphorus: 3 mg/dL (ref 2.5–4.6)

## 2023-03-25 LAB — CBC WITH DIFFERENTIAL/PLATELET
Abs Immature Granulocytes: 0.02 10*3/uL (ref 0.00–0.07)
Basophils Absolute: 0 10*3/uL (ref 0.0–0.1)
Basophils Relative: 0 %
Eosinophils Absolute: 0.1 10*3/uL (ref 0.0–0.5)
Eosinophils Relative: 1 %
HCT: 40 % (ref 36.0–46.0)
Hemoglobin: 13.1 g/dL (ref 12.0–15.0)
Immature Granulocytes: 0 %
Lymphocytes Relative: 22 %
Lymphs Abs: 1.6 10*3/uL (ref 0.7–4.0)
MCH: 30.4 pg (ref 26.0–34.0)
MCHC: 32.8 g/dL (ref 30.0–36.0)
MCV: 92.8 fL (ref 80.0–100.0)
Monocytes Absolute: 0.8 10*3/uL (ref 0.1–1.0)
Monocytes Relative: 12 %
Neutro Abs: 4.6 10*3/uL (ref 1.7–7.7)
Neutrophils Relative %: 65 %
Platelets: 161 10*3/uL (ref 150–400)
RBC: 4.31 MIL/uL (ref 3.87–5.11)
RDW: 16.5 % — ABNORMAL HIGH (ref 11.5–15.5)
WBC: 7 10*3/uL (ref 4.0–10.5)
nRBC: 0 % (ref 0.0–0.2)

## 2023-03-25 LAB — CBG MONITORING, ED
Glucose-Capillary: 138 mg/dL — ABNORMAL HIGH (ref 70–99)
Glucose-Capillary: 133 mg/dL — ABNORMAL HIGH (ref 70–99)

## 2023-03-25 LAB — RAPID URINE DRUG SCREEN, HOSP PERFORMED
Amphetamines: NOT DETECTED
Barbiturates: NOT DETECTED
Benzodiazepines: NOT DETECTED
Cocaine: NOT DETECTED
Opiates: NOT DETECTED
Tetrahydrocannabinol: NOT DETECTED

## 2023-03-25 LAB — PROTIME-INR
INR: 1.1 (ref 0.8–1.2)
Prothrombin Time: 14.7 seconds (ref 11.4–15.2)

## 2023-03-25 LAB — POTASSIUM: Potassium: 2.8 mmol/L — ABNORMAL LOW (ref 3.5–5.1)

## 2023-03-25 LAB — APTT: aPTT: 33 seconds (ref 24–36)

## 2023-03-25 MED ORDER — SODIUM CHLORIDE 0.9 % IV SOLN: INTRAVENOUS | Status: DC | PRN

## 2023-03-25 MED ORDER — LOSARTAN POTASSIUM 50 MG PO TABS
25.0000 mg | ORAL_TABLET | Freq: Every day | ORAL | Status: DC
  Administered 2023-03-25: 25 mg via ORAL
  Filled 2023-03-25: qty 1

## 2023-03-25 MED ORDER — INSULIN ASPART 100 UNIT/ML IJ SOLN
0.0000 [IU] | Freq: Three times a day (TID) | INTRAMUSCULAR | Status: DC
  Administered 2023-03-25: 3 [IU] via SUBCUTANEOUS
  Administered 2023-03-26: 4 [IU] via SUBCUTANEOUS
  Administered 2023-03-26: 3 [IU] via SUBCUTANEOUS
  Administered 2023-03-27: 4 [IU] via SUBCUTANEOUS
  Administered 2023-03-27: 3 [IU] via SUBCUTANEOUS

## 2023-03-25 MED ORDER — ACETAMINOPHEN 325 MG PO TABS
650.0000 mg | ORAL_TABLET | ORAL | Status: DC | PRN
  Administered 2023-03-25 – 2023-03-31 (×9): 650 mg via ORAL
  Filled 2023-03-25 (×11): qty 2

## 2023-03-25 MED ORDER — POTASSIUM CHLORIDE CRYS ER 20 MEQ PO TBCR
40.0000 meq | EXTENDED_RELEASE_TABLET | Freq: Two times a day (BID) | ORAL | Status: DC
  Administered 2023-03-25 (×2): 40 meq via ORAL
  Filled 2023-03-25 (×3): qty 2

## 2023-03-25 MED ORDER — ORAL CARE MOUTH RINSE: 15.0000 mL | OROMUCOSAL | Status: DC | PRN

## 2023-03-25 MED ORDER — PANTOPRAZOLE SODIUM 40 MG IV SOLR
40.0000 mg | Freq: Every day | INTRAVENOUS | Status: DC
  Administered 2023-03-25: 40 mg via INTRAVENOUS
  Filled 2023-03-25: qty 10

## 2023-03-25 MED ORDER — ACETAMINOPHEN 650 MG RE SUPP: 650.0000 mg | RECTAL | Status: DC | PRN

## 2023-03-25 MED ORDER — STROKE: EARLY STAGES OF RECOVERY BOOK
Freq: Once | Status: AC
  Filled 2023-03-25: qty 1

## 2023-03-25 MED ORDER — SENNOSIDES-DOCUSATE SODIUM 8.6-50 MG PO TABS
1.0000 | ORAL_TABLET | Freq: Two times a day (BID) | ORAL | Status: DC
  Administered 2023-03-25 – 2023-03-27 (×2): 1 via ORAL
  Filled 2023-03-25 (×5): qty 1

## 2023-03-25 MED ORDER — GADOBUTROL 1 MMOL/ML IV SOLN
10.0000 mL | Freq: Once | INTRAVENOUS | Status: AC | PRN
  Administered 2023-03-25: 10 mL via INTRAVENOUS

## 2023-03-25 MED ORDER — ACETAMINOPHEN 160 MG/5ML PO SOLN
650.0000 mg | ORAL | Status: DC | PRN
  Administered 2023-03-28 – 2023-04-03 (×12): 650 mg
  Filled 2023-03-25 (×12): qty 20.3

## 2023-03-25 MED ORDER — INSULIN ASPART 100 UNIT/ML IJ SOLN: 0.0000 [IU] | Freq: Every day | INTRAMUSCULAR | Status: DC

## 2023-03-25 MED ORDER — POTASSIUM CHLORIDE 10 MEQ/100ML IV SOLN
10.0000 meq | INTRAVENOUS | Status: AC
  Administered 2023-03-25 (×2): 10 meq via INTRAVENOUS
  Filled 2023-03-25 (×2): qty 100

## 2023-03-25 MED ORDER — INSULIN ASPART 100 UNIT/ML IJ SOLN
6.0000 [IU] | Freq: Three times a day (TID) | INTRAMUSCULAR | Status: DC
  Administered 2023-03-25 – 2023-03-26 (×6): 6 [IU] via SUBCUTANEOUS

## 2023-03-25 MED ORDER — IOHEXOL 350 MG/ML SOLN
75.0000 mL | Freq: Once | INTRAVENOUS | Status: AC | PRN
  Administered 2023-03-25: 75 mL via INTRAVENOUS

## 2023-03-25 MED ORDER — CHLORHEXIDINE GLUCONATE CLOTH 2 % EX PADS
6.0000 | MEDICATED_PAD | Freq: Every day | CUTANEOUS | Status: DC
  Administered 2023-03-27 – 2023-04-01 (×7): 6 via TOPICAL

## 2023-03-25 MED ORDER — CLEVIDIPINE BUTYRATE 0.5 MG/ML IV EMUL
0.0000 mg/h | INTRAVENOUS | Status: DC
  Administered 2023-03-25: 2 mg/h via INTRAVENOUS
  Administered 2023-03-25: 15 mg/h via INTRAVENOUS
  Administered 2023-03-25: 10 mg/h via INTRAVENOUS
  Administered 2023-03-26: 2 mg/h via INTRAVENOUS
  Administered 2023-03-26: 10 mg/h via INTRAVENOUS
  Administered 2023-03-26: 12 mg/h via INTRAVENOUS
  Filled 2023-03-25 (×7): qty 100

## 2023-03-25 MED ORDER — POTASSIUM CHLORIDE CRYS ER 20 MEQ PO TBCR
20.0000 meq | EXTENDED_RELEASE_TABLET | Freq: Once | ORAL | Status: AC
  Administered 2023-03-25: 20 meq via ORAL
  Filled 2023-03-25: qty 1

## 2023-03-25 NOTE — ED Provider Notes (Signed)
[place] EMERGENCY DEPARTMENT AT [facility] Provider Note   CSN: [id number] Arrival date & time: 03/25/23  0348     History  Chief Complaint  Patient presents with   Fall    May have hit head and left arm on dresser    Allison Caldwell is a 77 y.o. female.  The history is provided by the patient.  Fall  She has history of hypertension, diabetes, transient ischemic attack and comes in following a fall at home.  She fell out of bed and hit her left arm and her head on the dresser.  She is complaining of pain in her left jaw and in her left forearm.  She is not on any anticoagulants but is on low-dose aspirin.   Home Medications Prior to Admission medications   Medication Sig Start Date End Date Taking? Authorizing Provider  Accu-Chek FastClix Lancets MISC Check blood sugar 1 time a day 09/28/20   Belva Agee, MD  Accu-Chek Softclix Lancets lancets Use as directed up to 4 times daily. 09/26/22   Gwenevere Abbot, MD  albuterol (VENTOLIN HFA) 108 (90 Base) MCG/ACT inhaler Inhale 2 puffs into the lungs every 6 (six) hours as needed for wheezing or shortness of breath. 09/26/22   Gwenevere Abbot, MD  aspirin EC 81 MG tablet Take 1 tablet (81 mg total) by mouth every morning. 05/05/16   Funches, Gerilyn Nestle, MD  blood glucose meter kit and supplies KIT Use up to four times daily as directed. 09/26/22   Gwenevere Abbot, MD  Blood Glucose Monitoring Suppl (ACCU-CHEK GUIDE) w/Device KIT 1 each by Does not apply route daily. Check blood sugar 1 time a day 01/31/19   Levora Dredge, MD  busPIRone (BUSPAR) 10 MG tablet Take 1 tablet (10 mg total) by mouth 3 (three) times daily. Patient taking differently: Take 10 mg by mouth daily. 08/08/19   Levora Dredge, MD  chlorthalidone (HYGROTON) 25 MG tablet Take 1 tablet (25 mg total) by mouth daily. 09/26/22 10/26/22  Gwenevere Abbot, MD  Fluticasone-Umeclidin-Vilant (TRELEGY ELLIPTA) 100-62.5-25 MCG/ACT AEPB Inhale 1 puff into the lungs  daily. 09/26/22   Gwenevere Abbot, MD  glucose blood (ACCU-CHEK GUIDE) test strip Check blood sugar 1 time per day 09/28/20   Belva Agee, MD  glucose blood test strip Use as directed up to four times daily 09/26/22   Gwenevere Abbot, MD  insulin aspart (NOVOLOG FLEXPEN) 100 UNIT/ML FlexPen Inject 4 Units into the skin 3 (three) times daily before meals. 09/26/22   Gwenevere Abbot, MD  insulin glargine (LANTUS) 100 UNIT/ML Solostar Pen Please inject 10 units at bedtime daily 09/26/22   Gwenevere Abbot, MD  Insulin Pen Needle 31G X 8 MM MISC Use 4 (four) times daily with insulin pens. 09/26/22   Gwenevere Abbot, MD  metFORMIN (GLUCOPHAGE) 500 MG tablet Take 1 tablet (500 mg total) by mouth 2 (two) times daily with a meal. 07/28/22   Katsadouros, Vasilios, MD  Multiple Vitamin (MULTIVITAMIN WITH MINERALS) TABS tablet Take 1 tablet by mouth daily. 05/05/16   Funches, Gerilyn Nestle, MD  olmesartan (BENICAR) 40 MG tablet TAKE 1 TABLET AT BEDTIME Patient taking differently: Take 40 mg by mouth daily. 06/23/22   Katsadouros, Vasilios, MD  pravastatin (PRAVACHOL) 20 MG tablet TAKE 1 TABLET AT BEDTIME Patient taking differently: Take 20 mg by mouth daily. 03/09/22   Belva Agee, MD  predniSONE (DELTASONE) 20 MG tablet Take 2 tablets (40 mg total) by mouth daily with breakfast. 09/27/22   Gwenevere Abbot, MD  sertraline (ZOLOFT) 100 MG tablet Take 1 tablet (100 mg total) by mouth daily. 07/28/22   Katsadouros, Vasilios, MD  TIADYLT ER 420 MG 24 hr capsule TAKE 1 CAPSULE EVERY DAY Patient taking differently: Take 420 mg by mouth daily. 01/06/22   Belva Agee, MD      Allergies    Ace inhibitors and Penicillins    Review of Systems   Review of Systems  All other systems reviewed and are negative.   Physical Exam Updated Vital Signs BP (!) 162/103 (BP Location: Right Arm)   Pulse 79   Temp 99.8 F (37.7 C) (Oral)   Resp 14   Ht 5\' 4"  (1.626 m)   Wt 126.9 kg   SpO2 96%   BMI 48.02 kg/m   Physical Exam Vitals and nursing note reviewed.   77 year old female, resting comfortably and in no acute distress. Vital signs are significant for elevated blood pressure. Oxygen saturation is 96%, which is normal. Head is normocephalic.  There is slight soft tissue swelling of the left malar area without significant tenderness. PERRLA, EOMI.  Neck is nontender and supple. Back is nontender and there is no CVA tenderness. Lungs are clear without rales, wheezes, or rhonchi. Chest is nontender. Heart has regular rate and rhythm without murmur. Abdomen is soft, flat, nontender. Extremities: There is no obvious swelling or deformity of the left forearm but there is mild tenderness.  She has 1+ pretibial edema bilaterally.  Full range of motion present in all joints without pain. Skin is warm and dry without rash. Neurologic: Mental status is normal, cranial nerves are intact, moves all extremities equally.  ED Results / Procedures / Treatments   Labs (all labs ordered are listed, but only abnormal results are displayed) Labs Reviewed  CBC WITH DIFFERENTIAL/PLATELET - Abnormal; Notable for the following components:      Result Value   RDW 16.5 (*)    All other components within normal limits  BASIC METABOLIC PANEL  ETHANOL  HEPATIC FUNCTION PANEL  PROTIME-INR  APTT  RAPID URINE DRUG SCREEN, HOSP PERFORMED  URINALYSIS, ROUTINE W REFLEX MICROSCOPIC    EKG None  Radiology CT Head Wo Contrast  Result Date: 03/25/2023 CLINICAL DATA:  Blunt facial trauma.  Fall with head injury EXAM: CT HEAD WITHOUT CONTRAST CT MAXILLOFACIAL WITHOUT CONTRAST CT CERVICAL SPINE WITHOUT CONTRAST TECHNIQUE: Multidetector CT imaging of the head, cervical spine, and maxillofacial structures were performed using the standard protocol without intravenous contrast. Multiplanar CT image reconstructions of the cervical spine and maxillofacial structures were also generated. RADIATION DOSE REDUCTION: This exam was  performed according to the departmental dose-optimization program which includes automated exposure control, adjustment of the mA and/or kV according to patient size and/or use of iterative reconstruction technique. COMPARISON:  Head CT 01/30/2016 FINDINGS: CT HEAD FINDINGS Brain: Patchy hemorrhage in the right parietal to posterior frontal lobe. Patchy nature makes accurate volumetric calculation difficult, the largest high-density pocket of blood measures up to 17 mm, the entire hemorrhagic area spans nearly 5 cm. The adjacent brain has the appearance of cytotoxic edema including at the upper insula, suspect this is infarct related hemorrhage rather than primary traumatic hemorrhage. Brain atrophy especially affecting the cerebellum. No hydrocephalus or midline shift Vascular: No hyperdense vessel or unexpected calcification. Skull: No acute finding CT MAXILLOFACIAL FINDINGS Osseous: No evidence of fracture or mandibular dislocation. Orbits: No evidence of injury Sinuses: 2 opacified right posterior ethmoid air cells with mild expansion and scalloping. 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 Soft tissues: No acute finding CT CERVICAL SPINE FINDINGS Alignment: No traumatic malalignment Skull base and vertebrae: No acute fracture Soft tissues and spinal canal: No prevertebral fluid or swelling. No visible canal hematoma. 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 Upper chest: Negative These results were called by telephone at the time of interpretation on 03/25/2023 at 5:47 am to provider Zulma Court [facility] , who verbally acknowledged these results. IMPRESSION: 1. Patchy hemorrhage centered in the right parietal lobe. History of fall but signs of cytotoxic edema at the margins, favor hemorrhagic infarct over contusion 2. Negative for facial or cervical spine fracture. Electronically Signed   By: Tiburcio Pea M.D.   On: 03/25/2023 05:48    CT Cervical Spine Wo Contrast  Result Date: 03/25/2023 CLINICAL DATA:  Blunt facial trauma.  Fall with head injury EXAM: CT HEAD WITHOUT CONTRAST CT MAXILLOFACIAL WITHOUT CONTRAST CT CERVICAL SPINE WITHOUT CONTRAST TECHNIQUE: Multidetector CT imaging of the head, cervical spine, and maxillofacial structures were performed using the standard protocol without intravenous contrast. Multiplanar CT image reconstructions of the cervical spine and maxillofacial structures were also generated. RADIATION DOSE REDUCTION: This exam was performed according to the departmental dose-optimization program which includes automated exposure control, adjustment of the mA and/or kV according to patient size and/or use of iterative reconstruction technique. COMPARISON:  Head CT 01/30/2016 FINDINGS: CT HEAD FINDINGS Brain: Patchy hemorrhage in the right parietal to posterior frontal lobe. Patchy nature makes accurate volumetric calculation difficult, the largest high-density pocket of blood measures up to 17 mm, the entire hemorrhagic area spans nearly 5 cm. The adjacent brain has the appearance of cytotoxic edema including at the upper insula, suspect this is infarct related hemorrhage rather than primary traumatic hemorrhage. Brain atrophy especially affecting the cerebellum. No hydrocephalus or midline shift Vascular: No hyperdense vessel or unexpected calcification. Skull: No acute finding CT MAXILLOFACIAL FINDINGS Osseous: No evidence of fracture or mandibular dislocation. Orbits: No evidence of injury Sinuses: 2 opacified right posterior ethmoid air cells with mild expansion and scalloping. 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 Soft tissues: No acute finding CT CERVICAL SPINE FINDINGS Alignment: No traumatic malalignment Skull base and vertebrae: No acute fracture Soft tissues and spinal canal: No prevertebral fluid or swelling. No visible canal hematoma. Disc levels: C4-5 and C5-6 biforaminal  stenosis from disc height loss and uncovertebral spurring. Upper chest: Negative These results were called by telephone at the time of interpretation on 03/25/2023 at 5:47 am to provider Providencia Hottenstein [facility] , who verbally acknowledged these results. IMPRESSION: 1. Patchy hemorrhage centered in the right parietal lobe. History of fall but signs of cytotoxic edema at the margins, favor hemorrhagic infarct over contusion 2. Negative for facial or cervical spine fracture. Electronically Signed   By: Tiburcio Pea M.D.   On: 03/25/2023 05:48   CT Maxillofacial Wo Contrast  Result Date: 03/25/2023 CLINICAL DATA:  Blunt facial trauma.  Fall with head injury EXAM: CT HEAD WITHOUT CONTRAST CT MAXILLOFACIAL WITHOUT CONTRAST CT CERVICAL SPINE WITHOUT CONTRAST TECHNIQUE: Multidetector CT imaging of the head, cervical spine, and maxillofacial structures were performed using the standard protocol without intravenous contrast. Multiplanar CT image reconstructions of the cervical spine and maxillofacial structures were also generated. RADIATION DOSE REDUCTION: This exam was performed according to the departmental dose-optimization program which includes automated exposure control, adjustment of the mA and/or kV according to patient size and/or use of iterative reconstruction technique. COMPARISON:  Head CT 01/30/2016 FINDINGS: CT HEAD FINDINGS Brain: Patchy hemorrhage in the right parietal to posterior frontal lobe. Patchy nature makes accurate volumetric calculation difficult, the largest high-density pocket of blood measures up to 17 mm, the entire hemorrhagic area spans nearly 5 cm. The adjacent brain has the appearance of cytotoxic edema including at the upper insula, suspect this is infarct related hemorrhage rather than primary traumatic hemorrhage. Brain atrophy especially affecting the cerebellum. No hydrocephalus or midline shift Vascular: No hyperdense vessel or unexpected calcification. Skull: No acute finding CT  MAXILLOFACIAL FINDINGS Osseous: No evidence of fracture or mandibular dislocation. Orbits: No evidence of injury Sinuses: 2 opacified right posterior ethmoid air cells with mild expansion and scalloping. 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 Soft tissues: No acute finding CT CERVICAL SPINE FINDINGS Alignment: No traumatic malalignment Skull base and vertebrae: No acute fracture Soft tissues and spinal canal: No prevertebral fluid or swelling. No visible canal hematoma. 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 Upper chest: Negative These results were called by telephone at the time of interpretation on 03/25/2023 at 5:47 am to provider Ceana Fiala [facility] , who verbally acknowledged these results. IMPRESSION: 1. Patchy hemorrhage centered in the right parietal lobe. History of fall but signs of cytotoxic edema at the margins, favor hemorrhagic infarct over contusion 2. Negative for facial or cervical spine fracture. Electronically Signed   By: Tiburcio Pea M.D.   On: 03/25/2023 05:48   DG Forearm Left  Result Date: 03/25/2023 CLINICAL DATA:  Fall from bed with left forearm pain EXAM: LEFT FOREARM - 2 VIEW COMPARISON:  None Available. FINDINGS: There is suggestion of a nondisplaced radial head fracture on the frontal view. No malalignment. Postoperative distal forearm. Osteopenia and arterial calcification. IMPRESSION: Possible nondisplaced radial head fracture, recommend dedicated elbow views. Electronically Signed   By: Tiburcio Pea M.D.   On: 03/25/2023 05:26    Procedures Procedures  Cardiac monitor shows normal sinus rhythm, per my interpretation.  Medications Ordered in ED Medications - No data to display  ED Course/ Medical Decision Making/ A&P                             Medical Decision Making Amount and/or Complexity of Data Reviewed Labs: ordered. Radiology: ordered.  Risk Decision regarding  hospitalization.   Fall at home without obvious injury on exam.  However, based on her complaints of pain, I have ordered CT scans of head and maxillofacial bones and cervical spine as well as plain x-rays of her left forearm.  X-rays of the forearm show possible nondisplaced radial head fracture and elbow x-rays have been ordered.  CT of the head shows a right frontal lobe hemorrhage which most likely is hemorrhagic conversion of his stroke.  CT of maxillofacial bones and cervical spine showed no acute process.  I have independently viewed all of these images, and agree with the radiologist's interpretation.  I initially discussed the CT findings with Dr. Derry Lory of neurology service, and also with Dr. Lovell Sheehan of neurosurgery service.  Both have reviewed the images and consensus is that this is likely a hemorrhagic stroke.  I have gone back to reevaluate the patient and she still has no clear neurologic findings on exam.  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  Dr. Derry Lory agrees  to admit the patient.  CRITICAL CARE Performed by: Dione Booze Total critical care time: 55 minutes Critical care time was exclusive of separately billable procedures and treating other patients. Critical care was necessary to treat or prevent imminent or life-threatening deterioration. Critical care was time spent personally by me on the following activities: development of treatment plan with patient and/or surrogate as well as nursing, discussions with consultants, evaluation of patient's response to treatment, examination of patient, obtaining history from patient or surrogate, ordering and performing treatments and interventions, ordering and review of laboratory studies, ordering and review of radiographic studies, pulse oximetry and re-evaluation of patient's condition.  Final Clinical Impression(s) / ED Diagnoses Final diagnoses:   Hemorrhagic stroke [facility])    Rx / DC Orders ED Discharge Orders     None         Dione Booze, MD 03/25/23 [phone]

## 2023-03-25 NOTE — Consult Note (Signed)
Reason for Consult: Intracranial hemorrhage Referring Physician: Dr. Luberta Robertson Domingue Allison Caldwell is an 77 y.o. female.  HPI: The patient is a 77 year old obese black female with multiple medical problems who by report fell while getting out of bed yesterday.  She was brought to [facility] via EMS.  A CAT scan demonstrated a right-sided intracranial hemorrhage.  A neurosurgical consultation was requested.  I spoke with Dr. Preston Fleeting and told him I thought this appeared to be a hemorrhagic infarction.  Neurology has seen the patient.  Presently the patient is alert and pleasant.  Her daughter-in-law is at the bedside.  She denies pain.  Past Medical History:  Diagnosis Date   Anxiety    Cardiomyopathy    Degenerative joint disease    Depression    Diabetes mellitus    NIDDM   Hypertension    Obesities, morbid (HCC)    TIA (transient ischemic attack)     Past Surgical History:  Procedure Laterality Date   CESAREAN SECTION     times 2   EYE SURGERY     laser - right eye 2010   left wrist     surgical repair   ORIF ANKLE FRACTURE     right    Family History  Problem Relation Age of Onset   Heart disease Mother    Cancer Father    Diabetes Sister    Kidney disease Brother    Stroke Brother     Social History:  reports that she has quit smoking. Her smoking use included cigarettes. She has never used smokeless tobacco. She reports current alcohol use. She reports that she does not use drugs.  Allergies:  Allergies  Allergen Reactions   Ace Inhibitors Other (See Comments)     cough   Penicillins Other (See Comments)    Muscle spasms, Has patient had a PCN reaction causing immediate rash, facial/tongue/throat swelling, SOB or lightheadedness with hypotension: Yes Has patient had a PCN reaction causing severe rash involving mucus membranes or skin necrosis: No Has patient had a PCN reaction that required hospitalization No Has patient had a PCN reaction occurring  within the last 10 years: Yes If all of the above answers are "NO", then may proceed with Cephalosporin use.     Medications: I have reviewed the patient's current medications. Prior to Admission: (Not in a hospital admission)  Scheduled:  [START ON 03/26/2023]  stroke: early stages of recovery book   Does not apply Once   insulin aspart  0-20 Units Subcutaneous TID WC   insulin aspart  0-5 Units Subcutaneous QHS   insulin aspart  6 Units Subcutaneous TID WC   pantoprazole (PROTONIX) IV  40 mg Intravenous QHS   senna-docusate  1 tablet Oral BID   Continuous:  clevidipine 10 mg/hr (03/25/23 0922)   ZOX:[id number] **OR** acetaminophen (TYLENOL) oral liquid 160 mg/5 mL **OR** acetaminophen Anti-infectives (From admission, onward)    None        Results for orders placed or performed during the hospital encounter of 03/25/23 (from the past 48 hour(s))  CBC with Differential     Status: Abnormal   Collection Time: 03/25/23  5:50 AM  Result Value Ref Range   WBC 7.0 4.0 - 10.5 K/uL   RBC 4.31 3.87 - 5.11 MIL/uL   Hemoglobin 13.1 12.0 - 15.0 g/dL   HCT 14.7 82.9 - 56.2 %   MCV 92.8 80.0 - 100.0 fL   MCH 30.4 26.0 - 34.0 pg  MCHC 32.8 30.0 - 36.0 g/dL   RDW 45.4 (H) 09.8 - 11.9 %   Platelets 161 150 - 400 K/uL   nRBC 0.0 0.0 - 0.2 %   Neutrophils Relative % 65 %   Neutro Abs 4.6 1.7 - 7.7 K/uL   Lymphocytes Relative 22 %   Lymphs Abs 1.6 0.7 - 4.0 K/uL   Monocytes Relative 12 %   Monocytes Absolute 0.8 0.1 - 1.0 K/uL   Eosinophils Relative 1 %   Eosinophils Absolute 0.1 0.0 - 0.5 K/uL   Basophils Relative 0 %   Basophils Absolute 0.0 0.0 - 0.1 K/uL   Immature Granulocytes 0 %   Abs Immature Granulocytes 0.02 0.00 - 0.07 K/uL    Comment: Performed at [facility], [address] [place], [place] [address]  Basic metabolic panel     Status: Abnormal   Collection Time: 03/25/23  5:50 AM  Result Value Ref Range   Sodium 135 135 - 145 mmol/L   Potassium 2.9 (L)  3.5 - 5.1 mmol/L   Chloride 91 (L) 98 - 111 mmol/L   CO2 29 22 - 32 mmol/L   Glucose, Bld 169 (H) 70 - 99 mg/dL    Comment: Glucose reference range applies only to samples taken after fasting for at least 8 hours.   BUN 6 (L) 8 - 23 mg/dL   Creatinine, Ser 9.56 0.44 - 1.00 mg/dL   Calcium 9.3 8.9 - 21.3 mg/dL   GFR, Estimated >08 >65 mL/min    Comment: (NOTE) Calculated using the CKD-EPI Creatinine Equation (2021)    Anion gap 15 5 - 15    Comment: Performed at [facility], [address], [place], [place] [address]  Hepatic function panel     Status: Abnormal   Collection Time: 03/25/23  5:50 AM  Result Value Ref Range   Total Protein 7.2 6.5 - 8.1 g/dL   Albumin 3.8 3.5 - 5.0 g/dL   AST 22 15 - 41 U/L   ALT 13 0 - 44 U/L   Alkaline Phosphatase 41 38 - 126 U/L   Total Bilirubin 1.2 0.3 - 1.2 mg/dL   Bilirubin, Direct 0.3 (H) 0.0 - 0.2 mg/dL   Indirect Bilirubin 0.9 0.3 - 0.9 mg/dL    Comment: Performed at [facility], [address] [place], [place] [address]  Protime-INR     Status: None   Collection Time: 03/25/23  5:50 AM  Result Value Ref Range   Prothrombin Time 14.7 11.4 - 15.2 seconds   INR 1.1 0.8 - 1.2    Comment: (NOTE) INR goal varies based on device and disease states. Performed at [facility], [address] [place], [place] [address]   APTT     Status: None   Collection Time: 03/25/23  5:50 AM  Result Value Ref Range   aPTT 33 24 - 36 seconds    Comment: Performed at [facility], [address], [place], [place] [address]  CBG monitoring, ED     Status: Abnormal   Collection Time: 03/25/23  8:07 AM  Result Value Ref Range   Glucose-Capillary 138 (H) 70 - 99 mg/dL    Comment: Glucose reference range applies only to samples taken after fasting for at least 8 hours.  Urine rapid drug screen (hosp performed)     Status: None   Collection Time: 03/25/23  8:12 AM  Result Value Ref Range   Opiates NONE  DETECTED NONE DETECTED    Cocaine NONE DETECTED NONE DETECTED   Benzodiazepines NONE DETECTED NONE DETECTED   Amphetamines NONE DETECTED NONE DETECTED   Tetrahydrocannabinol NONE DETECTED NONE DETECTED   Barbiturates NONE DETECTED NONE DETECTED    Comment: (NOTE) DRUG SCREEN FOR MEDICAL PURPOSES ONLY.  IF CONFIRMATION IS NEEDED FOR ANY PURPOSE, NOTIFY LAB WITHIN 5 DAYS.  LOWEST DETECTABLE LIMITS FOR URINE DRUG SCREEN Drug Class                     Cutoff (ng/mL) Amphetamine and metabolites    1000 Barbiturate and metabolites    200 Benzodiazepine                 200 Opiates and metabolites        300 Cocaine and metabolites        300 THC                            50 Performed at [facility], [address], [place], [place] [address]   Urinalysis, Routine w reflex microscopic -Urine, Clean Catch     Status: Abnormal   Collection Time: 03/25/23  8:12 AM  Result Value Ref Range   Color, Urine STRAW (A) YELLOW   APPearance CLEAR CLEAR   Specific Gravity, Urine 1.004 (L) 1.005 - 1.030   pH 8.0 5.0 - 8.0   Glucose, UA NEGATIVE NEGATIVE mg/dL   Hgb urine dipstick NEGATIVE NEGATIVE   Bilirubin Urine NEGATIVE NEGATIVE   Ketones, ur NEGATIVE NEGATIVE mg/dL   Protein, ur NEGATIVE NEGATIVE mg/dL   Nitrite NEGATIVE NEGATIVE   Leukocytes,Ua NEGATIVE NEGATIVE    Comment: Performed at [facility], [address] [place], [place] [address]    DG Elbow Complete Left  Result Date: 03/25/2023 CLINICAL DATA:  Fall. Possible radial head fracture seen on forearm x-rays. EXAM: LEFT ELBOW - COMPLETE 3+ VIEW COMPARISON:  Left forearm x-rays from same day. FINDINGS: There is no evidence of fracture, dislocation, or joint effusion. Mild degenerative changes. Soft tissues are unremarkable. IMPRESSION: 1. No acute osseous abnormality. Electronically Signed   By: Obie Dredge M.D.   On: 03/25/2023 08:51   CT Head Wo Contrast  Result Date: 03/25/2023 CLINICAL DATA:  Blunt facial trauma.  Fall with head  injury EXAM: CT HEAD WITHOUT CONTRAST CT MAXILLOFACIAL WITHOUT CONTRAST CT CERVICAL SPINE WITHOUT CONTRAST TECHNIQUE: Multidetector CT imaging of the head, cervical spine, and maxillofacial structures were performed using the standard protocol without intravenous contrast. Multiplanar CT image reconstructions of the cervical spine and maxillofacial structures were also generated. RADIATION DOSE REDUCTION: This exam was performed according to the departmental dose-optimization program which includes automated exposure control, adjustment of the mA and/or kV according to patient size and/or use of iterative reconstruction technique. COMPARISON:  Head CT 01/30/2016 FINDINGS: CT HEAD FINDINGS Brain: Patchy hemorrhage in the right parietal to posterior frontal lobe. Patchy nature makes accurate volumetric calculation difficult, the largest high-density pocket of blood measures up to 17 mm, the entire hemorrhagic area spans nearly 5 cm. The adjacent brain has the appearance of cytotoxic edema including at the upper insula, suspect this is infarct related hemorrhage rather than primary traumatic hemorrhage. Brain atrophy especially affecting the cerebellum. No hydrocephalus or midline shift Vascular: No hyperdense vessel or unexpected calcification. Skull: No acute finding CT MAXILLOFACIAL FINDINGS Osseous: No evidence of fracture or mandibular dislocation. Orbits: No evidence of injury  Sinuses: 2 opacified right posterior ethmoid air cells with mild expansion and scalloping. 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 Soft tissues: No acute finding CT CERVICAL SPINE FINDINGS Alignment: No traumatic malalignment Skull base and vertebrae: No acute fracture Soft tissues and spinal canal: No prevertebral fluid or swelling. No visible canal hematoma. 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 Upper chest: Negative These results were called by  telephone at the time of interpretation on 03/25/2023 at 5:47 am to provider DAVID [facility] , who verbally acknowledged these results. IMPRESSION: 1. Patchy hemorrhage centered in the right parietal lobe. History of fall but signs of cytotoxic edema at the margins, favor hemorrhagic infarct over contusion 2. Negative for facial or cervical spine fracture. Electronically Signed   By: Tiburcio Pea M.D.   On: 03/25/2023 05:48   CT Cervical Spine Wo Contrast  Result Date: 03/25/2023 CLINICAL DATA:  Blunt facial trauma.  Fall with head injury EXAM: CT HEAD WITHOUT CONTRAST CT MAXILLOFACIAL WITHOUT CONTRAST CT CERVICAL SPINE WITHOUT CONTRAST TECHNIQUE: Multidetector CT imaging of the head, cervical spine, and maxillofacial structures were performed using the standard protocol without intravenous contrast. Multiplanar CT image reconstructions of the cervical spine and maxillofacial structures were also generated. RADIATION DOSE REDUCTION: This exam was performed according to the departmental dose-optimization program which includes automated exposure control, adjustment of the mA and/or kV according to patient size and/or use of iterative reconstruction technique. COMPARISON:  Head CT 01/30/2016 FINDINGS: CT HEAD FINDINGS Brain: Patchy hemorrhage in the right parietal to posterior frontal lobe. Patchy nature makes accurate volumetric calculation difficult, the largest high-density pocket of blood measures up to 17 mm, the entire hemorrhagic area spans nearly 5 cm. The adjacent brain has the appearance of cytotoxic edema including at the upper insula, suspect this is infarct related hemorrhage rather than primary traumatic hemorrhage. Brain atrophy especially affecting the cerebellum. No hydrocephalus or midline shift Vascular: No hyperdense vessel or unexpected calcification. Skull: No acute finding CT MAXILLOFACIAL FINDINGS Osseous: No evidence of fracture or mandibular dislocation. Orbits: No evidence of injury  Sinuses: 2 opacified right posterior ethmoid air cells with mild expansion and scalloping. 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 Soft tissues: No acute finding CT CERVICAL SPINE FINDINGS Alignment: No traumatic malalignment Skull base and vertebrae: No acute fracture Soft tissues and spinal canal: No prevertebral fluid or swelling. No visible canal hematoma. 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 Upper chest: Negative These results were called by telephone at the time of interpretation on 03/25/2023 at 5:47 am to provider DAVID [facility] , who verbally acknowledged these results. IMPRESSION: 1. Patchy hemorrhage centered in the right parietal lobe. History of fall but signs of cytotoxic edema at the margins, favor hemorrhagic infarct over contusion 2. Negative for facial or cervical spine fracture. Electronically Signed   By: Tiburcio Pea M.D.   On: 03/25/2023 05:48   CT Maxillofacial Wo Contrast  Result Date: 03/25/2023 CLINICAL DATA:  Blunt facial trauma.  Fall with head injury EXAM: CT HEAD WITHOUT CONTRAST CT MAXILLOFACIAL WITHOUT CONTRAST CT CERVICAL SPINE WITHOUT CONTRAST TECHNIQUE: Multidetector CT imaging of the head, cervical spine, and maxillofacial structures were performed using the standard protocol without intravenous contrast. Multiplanar CT image reconstructions of the cervical spine and maxillofacial structures were also generated. RADIATION DOSE REDUCTION: This exam was performed according to the departmental dose-optimization program which includes automated exposure control,  adjustment of the mA and/or kV according to patient size and/or use of iterative reconstruction technique. COMPARISON:  Head CT 01/30/2016 FINDINGS: CT HEAD FINDINGS Brain: Patchy hemorrhage in the right parietal to posterior frontal lobe. Patchy nature makes accurate volumetric calculation difficult, the largest high-density pocket of  blood measures up to 17 mm, the entire hemorrhagic area spans nearly 5 cm. The adjacent brain has the appearance of cytotoxic edema including at the upper insula, suspect this is infarct related hemorrhage rather than primary traumatic hemorrhage. Brain atrophy especially affecting the cerebellum. No hydrocephalus or midline shift Vascular: No hyperdense vessel or unexpected calcification. Skull: No acute finding CT MAXILLOFACIAL FINDINGS Osseous: No evidence of fracture or mandibular dislocation. Orbits: No evidence of injury Sinuses: 2 opacified right posterior ethmoid air cells with mild expansion and scalloping. 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 Soft tissues: No acute finding CT CERVICAL SPINE FINDINGS Alignment: No traumatic malalignment Skull base and vertebrae: No acute fracture Soft tissues and spinal canal: No prevertebral fluid or swelling. No visible canal hematoma. 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 Upper chest: Negative These results were called by telephone at the time of interpretation on 03/25/2023 at 5:47 am to provider DAVID [facility] , who verbally acknowledged these results. IMPRESSION: 1. Patchy hemorrhage centered in the right parietal lobe. History of fall but signs of cytotoxic edema at the margins, favor hemorrhagic infarct over contusion 2. Negative for facial or cervical spine fracture. Electronically Signed   By: Tiburcio Pea M.D.   On: 03/25/2023 05:48   DG Forearm Left  Result Date: 03/25/2023 CLINICAL DATA:  Fall from bed with left forearm pain EXAM: LEFT FOREARM - 2 VIEW COMPARISON:  None Available. FINDINGS: There is suggestion of a nondisplaced radial head fracture on the frontal view. No malalignment. Postoperative distal forearm. Osteopenia and arterial calcification. IMPRESSION: Possible nondisplaced radial head fracture, recommend dedicated elbow views. Electronically Signed   By:  Tiburcio Pea M.D.   On: 03/25/2023 05:26    ROS: As above Blood pressure (!) 151/93, pulse 92, temperature 97.8 F (36.6 C), temperature source Oral, resp. rate 19, height 5\' 4"  (1.626 m), weight 126.9 kg, SpO2 96 %. Estimated body mass index is 48.02 kg/m as calculated from the following:   Height as of this encounter: 5\' 4"  (1.626 m).   Weight as of this encounter: 126.9 kg.  Physical Exam  General: An alert and pleasant 77 year old black female in no apparent distress  HEENT: Normocephalic, extraocular muscles are intact  Neck: Unremarkable  Thorax: Symmetric  Abdomen: Obese  Neurologic exam: The patient is alert and oriented x 3.  Glasgow Coma Scale 15.  Cranial nerves II through XII were examined bilaterally and grossly normal.  The patient's motor strength is diffusely weak with suboptimal effort.  She appears to be slightly weak in her left upper extremity.  Sensory function is intact to light touch sensation in all tested dermatomes bilaterally.  Cerebellar function is intact to rapid alternating movements of the extremities bilaterally.  I reviewed the patient's head CT performed at [facility] this morning.  It demonstrates what appears to be a right hemorrhagic infarction.  I do not think this is a contusion.  Assessment/Plan: Right hemorrhagic infarction: I have discussed the situation with Dr. Preston Fleeting and the patient.  She does not need and would not benefit from surgery.  She has been seen by neurology for further workup and treatment  of her stroke.  I will sign off.  Please call if I can be of further assistance.  Allison Caldwell 03/25/2023, 9:38 AM

## 2023-03-25 NOTE — Progress Notes (Addendum)
STROKE TEAM PROGRESS NOTE   INTERVAL HISTORY Her daughter is at bedside.  Patient was noted 524 to ED after falling out of bed the day before.  Imaging found to have hemorrhagic transformation of right parietal stroke.  Patient does take aspirin at home but no other anticoagulation.  On exam patient is sitting up at the bed eating breakfast.  Speech is slurred, disoriented to day.  Object naming, repetition intact.  Follows all commands.  Stroke workup continuing.  Pending MRI brain and TTE.  Vitals:   03/25/23 1245 03/25/23 1300 03/25/23 1315 03/25/23 1330  BP: 112/73 120/70 124/77 (!) 139/55  Pulse: 83 91 89 93  Resp: (!) 23 20 19 19   Temp:      TempSrc:      SpO2: 90% 91% 93% (!) 89%  Weight:      Height:       CBC:  Recent Labs  Lab 03/25/23 0550  WBC 7.0  NEUTROABS 4.6  HGB 13.1  HCT 40.0  MCV 92.8  PLT 161   Basic Metabolic Panel:  Recent Labs  Lab 03/25/23 0550 03/25/23 1443  NA 135  --   K 2.9*  --   CL 91*  --   CO2 29  --   GLUCOSE 169*  --   BUN 6*  --   CREATININE 0.61  --   CALCIUM 9.3  --   PHOS  --  3.0   Lipid Panel: No results for input(s): "CHOL", "TRIG", "HDL", "CHOLHDL", "VLDL", "LDLCALC" in the last 168 hours. HgbA1c: No results for input(s): "HGBA1C" in the last 168 hours. Urine Drug Screen:  Recent Labs  Lab 03/25/23 0812  LABOPIA NONE DETECTED  COCAINSCRNUR NONE DETECTED  LABBENZ NONE DETECTED  AMPHETMU NONE DETECTED  THCU NONE DETECTED  LABBARB NONE DETECTED    Alcohol Level  Recent Labs  Lab 03/25/23 1201  ETH <10    IMAGING past 24 hours CT ANGIO HEAD NECK W WO CM  Result Date: 03/25/2023 CLINICAL DATA:  Stroke, determine embolic source EXAM: CT ANGIOGRAPHY HEAD AND NECK WITH AND WITHOUT CONTRAST TECHNIQUE: Multidetector CT imaging of the head and neck was performed using the standard protocol during bolus administration of intravenous contrast. Multiplanar CT image reconstructions and MIPs were obtained to evaluate the  vascular anatomy. Carotid stenosis measurements (when applicable) are obtained utilizing NASCET criteria, using the distal internal carotid diameter as the denominator. RADIATION DOSE REDUCTION: This exam was performed according to the departmental dose-optimization program which includes automated exposure control, adjustment of the mA and/or kV according to patient size and/or use of iterative reconstruction technique. CONTRAST:  75mL OMNIPAQUE IOHEXOL 350 MG/ML SOLN COMPARISON:  Head CT from earlier today FINDINGS: CTA NECK FINDINGS Aortic arch: No acute finding.  Three vessel branching Right carotid system: Tortuous vessels. No stenosis, beading, significant atheromatous plaque, or ulceration Left carotid system: Tortuous vessels. No stenosis, beading, significant atheromatous plaque, or ulceration Vertebral arteries: No proximal subclavian stenosis. Both vertebral arteries are smoothly contoured and widely patent. Skeleton: No acute finding Other neck: No acute finding Upper chest: Enlarged main pulmonary artery, although partially covered at least 5.5 cm in diameter and compatible with pulmonary hypertension. Review of the MIP images confirms the above findings CTA HEAD FINDINGS Anterior circulation: Atheromatous calcification of the carotid siphons. No branch occlusion, aneurysm, or spot sign seen underlying the right cerebral hemorrhage. Moderate left A2 segment stenosis. Posterior circulation: The vertebral and basilar arteries are smoothly contoured and widely patent with mild  atheromatous plaque bilaterally. Atheromatous irregularity of the bilateral PCA without proximal and flow reducing stenosis. Venous sinuses: Unremarkable Anatomic variants: None significant Review of the MIP images confirms the above findings IMPRESSION: 1. No emergent vascular finding. No branch occlusion or vascular lesion seen underlying the right cerebral hemorrhage. The dural sinuses are also patent. 2. Atherosclerosis without  flow limiting stenosis of major arteries in the head and neck. 3. Large main pulmonary artery as seen with pulmonary hypertension. Electronically Signed   By: Tiburcio Pea M.D.   On: 03/25/2023 09:38   DG Elbow Complete Left  Result Date: 03/25/2023 CLINICAL DATA:  Fall. Possible radial head fracture seen on forearm x-rays. EXAM: LEFT ELBOW - COMPLETE 3+ VIEW COMPARISON:  Left forearm x-rays from same day. FINDINGS: There is no evidence of fracture, dislocation, or joint effusion. Mild degenerative changes. Soft tissues are unremarkable. IMPRESSION: 1. No acute osseous abnormality. Electronically Signed   By: Obie Dredge M.D.   On: 03/25/2023 08:51   CT Head Wo Contrast  Result Date: 03/25/2023 CLINICAL DATA:  Blunt facial trauma.  Fall with head injury EXAM: CT HEAD WITHOUT CONTRAST CT MAXILLOFACIAL WITHOUT CONTRAST CT CERVICAL SPINE WITHOUT CONTRAST TECHNIQUE: Multidetector CT imaging of the head, cervical spine, and maxillofacial structures were performed using the standard protocol without intravenous contrast. Multiplanar CT image reconstructions of the cervical spine and maxillofacial structures were also generated. RADIATION DOSE REDUCTION: This exam was performed according to the departmental dose-optimization program which includes automated exposure control, adjustment of the mA and/or kV according to patient size and/or use of iterative reconstruction technique. COMPARISON:  Head CT 01/30/2016 FINDINGS: CT HEAD FINDINGS Brain: Patchy hemorrhage in the right parietal to posterior frontal lobe. Patchy nature makes accurate volumetric calculation difficult, the largest high-density pocket of blood measures up to 17 mm, the entire hemorrhagic area spans nearly 5 cm. The adjacent brain has the appearance of cytotoxic edema including at the upper insula, suspect this is infarct related hemorrhage rather than primary traumatic hemorrhage. Brain atrophy especially affecting the cerebellum. No  hydrocephalus or midline shift Vascular: No hyperdense vessel or unexpected calcification. Skull: No acute finding CT MAXILLOFACIAL FINDINGS Osseous: No evidence of fracture or mandibular dislocation. Orbits: No evidence of injury Sinuses: 2 opacified right posterior ethmoid air cells with mild expansion and scalloping. 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 Soft tissues: No acute finding CT CERVICAL SPINE FINDINGS Alignment: No traumatic malalignment Skull base and vertebrae: No acute fracture Soft tissues and spinal canal: No prevertebral fluid or swelling. No visible canal hematoma. 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 Upper chest: Negative These results were called by telephone at the time of interpretation on 03/25/2023 at 5:47 am to provider DAVID [facility] , who verbally acknowledged these results. IMPRESSION: 1. Patchy hemorrhage centered in the right parietal lobe. History of fall but signs of cytotoxic edema at the margins, favor hemorrhagic infarct over contusion 2. Negative for facial or cervical spine fracture. Electronically Signed   By: Tiburcio Pea M.D.   On: 03/25/2023 05:48   CT Cervical Spine Wo Contrast  Result Date: 03/25/2023 CLINICAL DATA:  Blunt facial trauma.  Fall with head injury EXAM: CT HEAD WITHOUT CONTRAST CT MAXILLOFACIAL WITHOUT CONTRAST CT CERVICAL SPINE WITHOUT CONTRAST TECHNIQUE: Multidetector CT imaging of the head, cervical spine, and maxillofacial structures were performed using the standard protocol without intravenous contrast. Multiplanar CT image reconstructions of the cervical spine and maxillofacial structures were  also generated. RADIATION DOSE REDUCTION: This exam was performed according to the departmental dose-optimization program which includes automated exposure control, adjustment of the mA and/or kV according to patient size and/or use of iterative reconstruction technique.  COMPARISON:  Head CT 01/30/2016 FINDINGS: CT HEAD FINDINGS Brain: Patchy hemorrhage in the right parietal to posterior frontal lobe. Patchy nature makes accurate volumetric calculation difficult, the largest high-density pocket of blood measures up to 17 mm, the entire hemorrhagic area spans nearly 5 cm. The adjacent brain has the appearance of cytotoxic edema including at the upper insula, suspect this is infarct related hemorrhage rather than primary traumatic hemorrhage. Brain atrophy especially affecting the cerebellum. No hydrocephalus or midline shift Vascular: No hyperdense vessel or unexpected calcification. Skull: No acute finding CT MAXILLOFACIAL FINDINGS Osseous: No evidence of fracture or mandibular dislocation. Orbits: No evidence of injury Sinuses: 2 opacified right posterior ethmoid air cells with mild expansion and scalloping. 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 Soft tissues: No acute finding CT CERVICAL SPINE FINDINGS Alignment: No traumatic malalignment Skull base and vertebrae: No acute fracture Soft tissues and spinal canal: No prevertebral fluid or swelling. No visible canal hematoma. 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 Upper chest: Negative These results were called by telephone at the time of interpretation on 03/25/2023 at 5:47 am to provider DAVID [facility] , who verbally acknowledged these results. IMPRESSION: 1. Patchy hemorrhage centered in the right parietal lobe. History of fall but signs of cytotoxic edema at the margins, favor hemorrhagic infarct over contusion 2. Negative for facial or cervical spine fracture. Electronically Signed   By: Tiburcio Pea M.D.   On: 03/25/2023 05:48   CT Maxillofacial Wo Contrast  Result Date: 03/25/2023 CLINICAL DATA:  Blunt facial trauma.  Fall with head injury EXAM: CT HEAD WITHOUT CONTRAST CT MAXILLOFACIAL WITHOUT CONTRAST CT CERVICAL SPINE WITHOUT CONTRAST  TECHNIQUE: Multidetector CT imaging of the head, cervical spine, and maxillofacial structures were performed using the standard protocol without intravenous contrast. Multiplanar CT image reconstructions of the cervical spine and maxillofacial structures were also generated. RADIATION DOSE REDUCTION: This exam was performed according to the departmental dose-optimization program which includes automated exposure control, adjustment of the mA and/or kV according to patient size and/or use of iterative reconstruction technique. COMPARISON:  Head CT 01/30/2016 FINDINGS: CT HEAD FINDINGS Brain: Patchy hemorrhage in the right parietal to posterior frontal lobe. Patchy nature makes accurate volumetric calculation difficult, the largest high-density pocket of blood measures up to 17 mm, the entire hemorrhagic area spans nearly 5 cm. The adjacent brain has the appearance of cytotoxic edema including at the upper insula, suspect this is infarct related hemorrhage rather than primary traumatic hemorrhage. Brain atrophy especially affecting the cerebellum. No hydrocephalus or midline shift Vascular: No hyperdense vessel or unexpected calcification. Skull: No acute finding CT MAXILLOFACIAL FINDINGS Osseous: No evidence of fracture or mandibular dislocation. Orbits: No evidence of injury Sinuses: 2 opacified right posterior ethmoid air cells with mild expansion and scalloping. 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 Soft tissues: No acute finding CT CERVICAL SPINE FINDINGS Alignment: No traumatic malalignment Skull base and vertebrae: No acute fracture Soft tissues and spinal canal: No prevertebral fluid or swelling. No visible canal hematoma. 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 Upper chest: Negative These results were called by telephone at the time of interpretation on 03/25/2023 at 5:47 am to provider  DAVID [facility] , who verbally  acknowledged these results. IMPRESSION: 1. Patchy hemorrhage centered in the right parietal lobe. History of fall but signs of cytotoxic edema at the margins, favor hemorrhagic infarct over contusion 2. Negative for facial or cervical spine fracture. Electronically Signed   By: Tiburcio Pea M.D.   On: 03/25/2023 05:48   DG Forearm Left  Result Date: 03/25/2023 CLINICAL DATA:  Fall from bed with left forearm pain EXAM: LEFT FOREARM - 2 VIEW COMPARISON:  None Available. FINDINGS: There is suggestion of a nondisplaced radial head fracture on the frontal view. No malalignment. Postoperative distal forearm. Osteopenia and arterial calcification. IMPRESSION: Possible nondisplaced radial head fracture, recommend dedicated elbow views. Electronically Signed   By: Tiburcio Pea M.D.   On: 03/25/2023 05:26    PHYSICAL EXAM General: sitting up comfortably in bed; in no acute distress.  HENT: Normal oropharynx and mucosa. Normal external appearance of ears and nose.  Neck: Supple, no pain or tenderness  CV: No JVD. No peripheral edema.  Pulmonary: Symmetric Chest rise. Normal respiratory effort.  Abdomen: Soft to touch, non-tender.  Ext: No cyanosis, edema, or deformity  Skin: No rash. Normal palpation of skin.   Musculoskeletal: Normal digits and nails by inspection. No clubbing.    Neurologic Examination  Mental status/Cognition: Alert, oriented to self, place, month and year, good attention.  Speech/language: dysarthric speech, fluent, comprehension intact, object naming intact, repetition intact.  Cranial nerves:   CN II Pupils equal and reactive to light, no VF deficits    CN III,IV,VI EOM intact, no gaze preference or deviation, no nystagmus    CN V normal sensation in V1, V2, and V3 segments bilaterally    CN VII Mild L facial droop   CN VIII normal hearing to speech    CN IX & X normal palatal elevation, no uvular deviation    CN XI 5/5 head turn and 5/5 shoulder shrug bilaterally    CN  XII midline tongue protrusion    Motor: Left-sided weakness, 4+/5 LUE and LLE. No drift BUE. Sensation: symmetric throughout Coordination: FNF with LUE ataxia.  Gait: deferred  ASSESSMENT/PLAN Ms. Latitia Cimini is a 77 y.o. female with history of DM2, HTN, morbid obesity  presenting with L sided weakness and found to have hemorrhagic transformation of R parietal stroke. Not on [facility], takes aspirin at home .   Hemorrhagic transformation of R parietal stroke Etiology:  likely embolic infarct due to new-onset atrial fibrillation with hemorrhagic transformation   CT head  Patchy hemorrhage centered in the right parietal lobe, favoring hemorrhagic infarct or contusion. CTA head & neck  No emergent LVO. Large main pulmonary artery as seen with pulmonary hypertension MRI: pending  2D Echo: pending EKG shows Atrial Fibrillation  LDL No results found for requested labs within last 1095 days. HgbA1c 9.8 VTE prophylaxis - SCDs.     Diet   Diet Carb Modified Fluid consistency: Thin; Room service appropriate? Yes   aspirin 81 mg daily prior to admission, now on No antithrombotic due to ICH. Patient will need OAC for Afib in the near future.   Therapy recommendations:  pending Disposition:  pending  Hypertension Home meds: Benicar 40 mg home med Losartan 50mg  started Cleviprex gtt, weaning as able. Now on low-dose SBP goal 130-150  Hyperlipidemia Home meds: Pravastatin 20 mg LDL pending, goal < 70  Diabetes type II Uncontrolled Home meds: Metformin 500 mg, insulin HgbA1c 9.8, goal < 7.0 CBGs Recent Labs  03/25/23 0807 03/25/23 1127 03/25/23 1214  GLUCAP 138* 133* 112*    [facility] PCP follow-up recommended as A1c is elevated out of goal range while on diabetic medications  Other Stroke Risk Factors Advanced Age >/= 50  Former Cigarette smoker Obesity, Body mass index is 47.95 kg/m., BMI >/= 30 associated with increased stroke risk, recommend weight loss, diet and  exercise as appropriate  Hx stroke/TIA Family hx stroke (brother) ? Obstructive sleep apnea, not on CPAP at home  Other Active Problems Hypokalemia PO/IV replacement, recheck tonight EKG shows Afib with prolonged QT, T-wave abnormality  Hospital day # 0   Pt seen by Neuro NP/APP and later by MD. Note/plan to be edited by MD as needed.    Lynnae January, DNP, AGACNP-BC [facility] Please use AMION for contact information & EPIC for messaging.  I have personally obtained history,examined this patient, reviewed notes, independently viewed imaging studies, participated in medical decision making and plan of care.ROS completed by me personally and pertinent positives fully documented  I have made any additions or clarifications directly to the above note. Agree with note above. 77 year old woman with DM2, HTN, presenting with L sided weakness, found to have a right parietal ischemic stroke with hemorrhagic transformation. She was also found to have new onset atrial fibrillation. Stroke etiology likely cardioembolic in the setting of new atrial fibrillation. She will need to be anticoagulated but base on the new hemorrhage will have to time start of anticoagulation.    Windell Norfolk, MD Neurology  Pager: [phone] 03/25/2023 4:18 PM    To contact Stroke Continuity provider, please refer to [email or web address]. After hours, contact General Neurology

## 2023-03-25 NOTE — H&P (Signed)
NEUROLOGY CONSULTATION NOTE   Date of service: Mar 25, 2023 Patient Name: Allison Caldwell MRN:  [id number] DOB:  08/17/1946 Reason for consult: "fall, left sided weakness" Requesting Provider: Dione Booze, MD _ _ _   _ __   _ __ _ _  __ __   _ __   __ _  History of Present Illness  Allison Caldwell is a 77 y.o. female with PMH significant for DM2, HTN, morbid obesity who fell as she got out of her bed. She hit her head on the dresser.  Had been in bed all day, in the evening sat at the edge of the bed and tried to get up and fell. EMS called but patient declined to come to the hospital. She was in the bed all day yesterday and hard to establish a LKW despite multiple attempts at clarifying.  LKW: 03/24/23 She was in the bed all day yesterday and hard to establish a LKW despite multiple attempts at clarifying. mRS: 2 tNKASE: not offered, hemorrhagic stroke. Thrombectomy: not offered, hemorrhagic stroke. NIHSS components Score: Comment  1a Level of Conscious 0[x]  1[]  2[]  3[]      1b LOC Questions 0[x]  1[]  2[]       1c LOC Commands 0[x]  1[]  2[]       2 Best Gaze 0[x]  1[]  2[]       3 Visual 0[x]  1[]  2[]  3[]      4 Facial Palsy 0[x]  1[]  2[]  3[]      5a Motor Arm - left 0[]  1[x]  2[]  3[]  4[]  UN[]    5b Motor Arm - Right 0[x]  1[]  2[]  3[]  4[]  UN[]    6a Motor Leg - Left 0[]  1[x]  2[]  3[]  4[]  UN[]    6b Motor Leg - Right 0[x]  1[]  2[]  3[]  4[]  UN[]    7 Limb Ataxia 0[]  1[x]  2[]  3[]  UN[]     8 Sensory 0[x]  1[]  2[]  UN[]      9 Best Language 0[x]  1[]  2[]  3[]      10 Dysarthria 0[]  1[x]  2[]  UN[]      11 Extinct. and Inattention 0[x]  1[]  2[]       TOTAL: 4     ROS   Constitutional Denies weight loss, fever and chills.   HEENT Denies changes in vision and hearing.   Respiratory Denies SOB and cough.   CV Denies palpitations and CP   GI Denies abdominal pain, nausea, vomiting and diarrhea.   GU Denies dysuria and urinary frequency.   MSK Denies myalgia and joint pain.   Skin Denies rash and  pruritus.   Neurological Denies headache and syncope.   Psychiatric Denies recent changes in mood. Denies anxiety and depression.    Past History   Past Medical History:  Diagnosis Date   Anxiety    Cardiomyopathy    Degenerative joint disease    Depression    Diabetes mellitus    NIDDM   Hypertension    Obesities, morbid (HCC)    TIA (transient ischemic attack)    Past Surgical History:  Procedure Laterality Date   CESAREAN SECTION     times 2   EYE SURGERY     laser - right eye 2010   left wrist     surgical repair   ORIF ANKLE FRACTURE     right   Family History  Problem Relation Age of Onset   Heart disease Mother    Cancer Father    Diabetes Sister    Kidney disease Brother    Stroke Brother    Social  History   Socioeconomic History   Marital status: Divorced    Spouse name: Not on file   Number of children: Not on file   Years of education: Not on file   Highest education level: Not on file  Occupational History   Occupation: House keeping-retired since 2006    Employer: RETIRED  Tobacco Use   Smoking status: Former    Types: Cigarettes   Smokeless tobacco: Never   Tobacco comments:    Quit x 2 yrrs.  Substance and Sexual Activity   Alcohol use: Yes    Comment: vodka with water on occasions    Drug use: No    Comment: THC occasionally.   Sexual activity: Not on file  Other Topics Concern   Not on file  Social History Narrative   Lives in [place].   Social Determinants of Health   Financial Resource Strain: Not on file  Food Insecurity: No Food Insecurity (09/23/2022)   Hunger Vital Sign    Worried About Running Out of Food in the Last Year: Never true    Ran Out of Food in the Last Year: Never true  Transportation Needs: No Transportation Needs (09/23/2022)   PRAPARE - Administrator, Civil Service (Medical): No    Lack of Transportation (Non-Medical): No  Physical Activity: Not on file  Stress: Not on file  Social  Connections: Not on file   Allergies  Allergen Reactions   Ace Inhibitors Other (See Comments)     cough   Penicillins Other (See Comments)    Muscle spasms, Has patient had a PCN reaction causing immediate rash, facial/tongue/throat swelling, SOB or lightheadedness with hypotension: Yes Has patient had a PCN reaction causing severe rash involving mucus membranes or skin necrosis: No Has patient had a PCN reaction that required hospitalization No Has patient had a PCN reaction occurring within the last 10 years: Yes If all of the above answers are "NO", then may proceed with Cephalosporin use.     Medications  (Not in a hospital admission)    Vitals   Vitals:   03/25/23 0352 03/25/23 0353 03/25/23 0600  BP: (!) 162/103  (!) 169/98  Pulse: 79  83  Resp: 14  15  Temp: 99.8 F (37.7 C)    TempSrc: Oral    SpO2: 96%  100%  Weight:  126.9 kg   Height:  5\' 4"  (1.626 m)      Body mass index is 48.02 kg/m.  Physical Exam   General: Laying comfortably in bed; in no acute distress.  HENT: Normal oropharynx and mucosa. Normal external appearance of ears and nose.  Neck: Supple, no pain or tenderness  CV: No JVD. No peripheral edema.  Pulmonary: Symmetric Chest rise. Normal respiratory effort.  Abdomen: Soft to touch, non-tender.  Ext: No cyanosis, edema, or deformity  Skin: No rash. Normal palpation of skin.   Musculoskeletal: Normal digits and nails by inspection. No clubbing.   Neurologic Examination  Mental status/Cognition: Alert, oriented to self, place, month and year, good attention.  Speech/language: dysarthric speech, fluent, comprehension intact, object naming intact, repetition intact.  Cranial nerves:   CN II Pupils equal and reactive to light, no VF deficits    CN III,IV,VI EOM intact, no gaze preference or deviation, no nystagmus    CN V normal sensation in V1, V2, and V3 segments bilaterally    CN VII Mild L facial droop   CN VIII normal hearing to speech  CN IX & X normal palatal elevation, no uvular deviation    CN XI 5/5 head turn and 5/5 shoulder shrug bilaterally    CN XII midline tongue protrusion    Motor:  Muscle bulk: normal, tone normal, pronator drift noted in LUE. tremor none Mvmt Root Nerve  Muscle Right Left Comments  SA C5/6 Ax Deltoid 5 4   EF C5/6 Mc Biceps  4   EE C6/7/8 Rad Triceps  4   WF C6/7 Med FCR     WE C7/8 PIN ECU     F Ab C8/T1 U ADM/FDI 5 4   HF L1/2/3 Fem Illopsoas 5 3   KE L2/3/4 Fem Quad 5 4+   DF L4/5 D Peron Tib Ant 5 4+   PF S1/2 Tibial Grc/Sol 5 4+    Sensation:  Light touch Intact throughout   Pin prick    Temperature    Vibration   Proprioception    Coordination/Complex Motor:  - Finger to Nose with ataxia on left. - Heel to shin unable to do 2/2 body habitus. - Rapid alternating movement are slowed on the left. - Gait: deferred for patient safety.  Labs   CBC:  Recent Labs  Lab 03/25/23 0550  WBC 7.0  NEUTROABS 4.6  HGB 13.1  HCT 40.0  MCV 92.8  PLT 161    Basic Metabolic Panel:  Lab Results  Component Value Date   NA 135 09/25/2022   K 3.7 09/25/2022   CO2 26 09/25/2022   GLUCOSE 219 (H) 09/25/2022   BUN 14 09/25/2022   CREATININE 0.63 09/25/2022   CALCIUM 8.7 (L) 09/25/2022   GFRNONAA >60 09/25/2022   GFRAA 98 09/28/2020   Lipid Panel:  Lab Results  Component Value Date   LDLCALC 74 05/05/2016   HgbA1c:  Lab Results  Component Value Date   HGBA1C 9.8 (H) 09/23/2022   Urine Drug Screen:     Component Value Date/Time   LABOPIA NONE DETECTED 07/09/2013 2109   COCAINSCRNUR NONE DETECTED 07/09/2013 2109   COCAINSCRNUR NEGATIVE 04/05/2010 0320   LABBENZ NONE DETECTED 07/09/2013 2109   LABBENZ NEGATIVE 04/05/2010 0320   AMPHETMU NONE DETECTED 07/09/2013 2109   THCU NONE DETECTED 07/09/2013 2109   LABBARB NONE DETECTED 07/09/2013 2109    Alcohol Level     Component Value Date/Time   ETH 216 (H) 08/07/2014 0432    CT Head without contrast(Personally  reviewed): 1. Patchy hemorrhage centered in the right parietal lobe. History of fall but signs of cytotoxic edema at the margins, favor hemorrhagic infarct over contusion 2. Negative for facial or cervical spine fracture.  CT angio Head and Neck with contrast: pending  Repeat CT Head: Pending   MRI Brain(Personally reviewed): pending  Impression   Allison Caldwell is a 77 y.o. female presenting with L sided weakness and found to have hemorrhagic transformation of R parietal stroke. Not on [facility], takes aspirin at home.  Etiology of stroke is likely embolic. Monitor shows ?afibb. Patient reports prior diagnosis of irregular heart beat.  Recommendations  - Admit to ICU - Stability scan in 6 hours or STAT with any neurological decline - Frequent neuro checks; q61min for 1 hour, then q1hour - No antiplatelets or anticoagulants due to ICH - SCD for DVT prophylaxis, pharmacological DVT ppx at 24 hours if ICH is stable - Blood pressure control with goal systolic 130 - 150, cleverplex and labetalol PRN - Stroke labs, HgbA1c, fasting lipid panel - MRI brain with and  without contrast when stabilized to evaluate for underlying mass - CT A head and neck - Risk factor modification - Echocardiogram - PT consult, OT consult, Speech consult. - Stroke team to follow  HTN: - hold home med.  - Goal BP as above. - Cleviprex and PRN labetalol for BP above goal  HLD: - cont hom statin - LDL pending.  Morbid obesity: BMI Readings from Last 1 Encounters:  03/25/23 48.02 kg/m    CODE STATUS: full code ______________________________________________________________________  This patient is critically ill and at significant risk of neurological worsening, death and care requires constant monitoring of vital signs, hemodynamics,respiratory and cardiac monitoring, neurological assessment, discussion with family, other specialists and medical decision making of high complexity. I spent 50  minutes of neurocritical care time  in the care of  this patient. This was time spent independent of any time provided by nurse practitioner or PA.  Erick Blinks [facility] Pager Number [phone] 03/25/2023  7:57 AM   Thank you for the opportunity to take part in the care of this patient. If you have any further questions, please contact the neurology consultation attending.  Signed,  Erick Blinks [facility] Pager Number [phone] _ _ _   _ __   _ __ _ _  __ __   _ __   __ _

## 2023-03-25 NOTE — ED Triage Notes (Signed)
Pt from home by ambulance. Pt fell out of bed onto floor hitting her left arm and head on a dresser. No pain.

## 2023-03-25 NOTE — Progress Notes (Signed)
SLP Cancellation Note  Patient Details Name: Elidia Rosen MRN: [id number] DOB: 09-Feb-1946   Cancelled treatment:       Reason Eval/Treat Not Completed: Patient at procedure or test/unavailable   Tressie Stalker, M.S., CCC-SLP 03/25/2023, 3:14 PM

## 2023-03-26 ENCOUNTER — Inpatient Hospital Stay (HOSPITAL_COMMUNITY): Payer: Medicare HMO

## 2023-03-26 DIAGNOSIS — I6389 Other cerebral infarction: Secondary | ICD-10-CM | POA: Diagnosis not present

## 2023-03-26 DIAGNOSIS — I480 Paroxysmal atrial fibrillation: Secondary | ICD-10-CM

## 2023-03-26 LAB — BASIC METABOLIC PANEL
Anion gap: 14 (ref 5–15)
BUN: 5 mg/dL — ABNORMAL LOW (ref 8–23)
CO2: 26 mmol/L (ref 22–32)
Calcium: 8.9 mg/dL (ref 8.9–10.3)
Chloride: 93 mmol/L — ABNORMAL LOW (ref 98–111)
Creatinine, Ser: 0.58 mg/dL (ref 0.44–1.00)
GFR, Estimated: >60 mL/min (ref >60)
Glucose, Bld: 121 mg/dL — ABNORMAL HIGH (ref 70–99)
Potassium: 3.1 mmol/L — ABNORMAL LOW (ref 3.5–5.1)
Sodium: 133 mmol/L — ABNORMAL LOW (ref 135–145)

## 2023-03-26 LAB — LIPID PANEL
Cholesterol: 164 mg/dL (ref 0–200)
HDL: 99 mg/dL (ref >40)
LDL Cholesterol: 54 mg/dL (ref 0–99)
Total CHOL/HDL Ratio: 1.7 RATIO
Triglycerides: 55 mg/dL (ref <150)
VLDL: 11 mg/dL (ref 0–40)

## 2023-03-26 LAB — CBC
HCT: 41.3 % (ref 36.0–46.0)
Hemoglobin: 13.7 g/dL (ref 12.0–15.0)
MCH: 29.8 pg (ref 26.0–34.0)
MCHC: 33.2 g/dL (ref 30.0–36.0)
MCV: 90 fL (ref 80.0–100.0)
Platelets: 150 10*3/uL (ref 150–400)
RBC: 4.59 MIL/uL (ref 3.87–5.11)
RDW: 16.4 % — ABNORMAL HIGH (ref 11.5–15.5)
WBC: 9.6 10*3/uL (ref 4.0–10.5)
nRBC: 0 % (ref 0.0–0.2)

## 2023-03-26 LAB — ECHOCARDIOGRAM COMPLETE
Area-P 1/2: 4.97 cm2
Height: 64 in
S' Lateral: 3.1 cm
Weight: 4469.17 oz

## 2023-03-26 LAB — GLUCOSE, CAPILLARY
Glucose-Capillary: 158 mg/dL — ABNORMAL HIGH (ref 70–99)
Glucose-Capillary: 125 mg/dL — ABNORMAL HIGH (ref 70–99)
Glucose-Capillary: 97 mg/dL (ref 70–99)
Glucose-Capillary: 121 mg/dL — ABNORMAL HIGH (ref 70–99)

## 2023-03-26 MED ORDER — POTASSIUM CHLORIDE CRYS ER 20 MEQ PO TBCR
40.0000 meq | EXTENDED_RELEASE_TABLET | Freq: Once | ORAL | Status: DC
  Filled 2023-03-26: qty 2

## 2023-03-26 MED ORDER — PERFLUTREN LIPID MICROSPHERE
1.0000 mL | INTRAVENOUS | Status: AC | PRN
  Administered 2023-03-26: 2 mL via INTRAVENOUS

## 2023-03-26 MED ORDER — LOSARTAN POTASSIUM 50 MG PO TABS
100.0000 mg | ORAL_TABLET | Freq: Every day | ORAL | Status: DC
  Administered 2023-03-26 – 2023-03-27 (×3): 100 mg via ORAL
  Filled 2023-03-26 (×3): qty 2

## 2023-03-26 MED ORDER — LOSARTAN POTASSIUM 50 MG PO TABS: 50.0000 mg | ORAL_TABLET | Freq: Every day | ORAL | Status: DC

## 2023-03-26 MED ORDER — QUETIAPINE FUMARATE 25 MG PO TABS
25.0000 mg | ORAL_TABLET | Freq: Every day | ORAL | Status: DC
  Administered 2023-03-26: 25 mg via ORAL
  Filled 2023-03-26: qty 1

## 2023-03-26 MED ORDER — HYDRALAZINE HCL 20 MG/ML IJ SOLN
10.0000 mg | Freq: Four times a day (QID) | INTRAMUSCULAR | Status: DC | PRN
  Administered 2023-03-26 – 2023-03-27 (×2): 10 mg via INTRAVENOUS
  Filled 2023-03-26 (×2): qty 1

## 2023-03-26 MED ORDER — SERTRALINE HCL 100 MG PO TABS
100.0000 mg | ORAL_TABLET | Freq: Every day | ORAL | Status: DC
  Administered 2023-03-26 – 2023-03-27 (×2): 100 mg via ORAL
  Filled 2023-03-26 (×2): qty 1

## 2023-03-26 MED ORDER — MELATONIN 3 MG PO TABS
3.0000 mg | ORAL_TABLET | Freq: Every day | ORAL | Status: DC
  Administered 2023-03-26: 3 mg via ORAL
  Filled 2023-03-26 (×2): qty 1

## 2023-03-26 MED ORDER — POTASSIUM CHLORIDE 10 MEQ/100ML IV SOLN
INTRAVENOUS | Status: AC
  Filled 2023-03-26: qty 100

## 2023-03-26 MED ORDER — METFORMIN HCL 500 MG PO TABS
500.0000 mg | ORAL_TABLET | Freq: Two times a day (BID) | ORAL | Status: DC
  Administered 2023-03-27: 500 mg via ORAL
  Filled 2023-03-26 (×3): qty 1

## 2023-03-26 MED ORDER — METOPROLOL TARTRATE 50 MG PO TABS
50.0000 mg | ORAL_TABLET | Freq: Two times a day (BID) | ORAL | Status: DC
  Administered 2023-03-26 – 2023-03-27 (×4): 50 mg via ORAL
  Filled 2023-03-26 (×5): qty 1

## 2023-03-26 MED ORDER — POTASSIUM CHLORIDE 10 MEQ/100ML IV SOLN
10.0000 meq | INTRAVENOUS | Status: AC
  Administered 2023-03-26 (×4): 10 meq via INTRAVENOUS
  Filled 2023-03-26 (×3): qty 100

## 2023-03-26 MED ORDER — HEPARIN SODIUM (PORCINE) 5000 UNIT/ML IJ SOLN
5000.0000 [IU] | Freq: Three times a day (TID) | INTRAMUSCULAR | Status: DC
  Administered 2023-03-26 – 2023-04-04 (×25): 5000 [IU] via SUBCUTANEOUS
  Filled 2023-03-26 (×24): qty 1

## 2023-03-26 MED ORDER — PRAVASTATIN SODIUM 40 MG PO TABS
20.0000 mg | ORAL_TABLET | Freq: Every day | ORAL | Status: DC
  Administered 2023-03-26 – 2023-03-27 (×2): 20 mg via ORAL
  Filled 2023-03-26 (×2): qty 1

## 2023-03-26 MED ORDER — PANTOPRAZOLE SODIUM 40 MG PO TBEC
40.0000 mg | DELAYED_RELEASE_TABLET | Freq: Every day | ORAL | Status: DC
  Administered 2023-03-27: 40 mg via ORAL
  Filled 2023-03-26 (×2): qty 1

## 2023-03-26 NOTE — Evaluation (Signed)
Clinical/Bedside Swallow Evaluation Patient Details  Name: Allison Caldwell MRN: [id number] Date of Birth: 02/19/1946  Today's Date: 03/26/2023 Time: SLP Start Time (ACUTE ONLY): 1205 SLP Stop Time (ACUTE ONLY): 1221 SLP Time Calculation (min) (ACUTE ONLY): 16 min  Past Medical History:  Past Medical History:  Diagnosis Date   Anxiety    Cardiomyopathy    Degenerative joint disease    Depression    Diabetes mellitus    NIDDM   Hypertension    Obesities, morbid (HCC)    TIA (transient ischemic attack)    Past Surgical History:  Past Surgical History:  Procedure Laterality Date   CESAREAN SECTION     times 2   EYE SURGERY     laser - right eye 2010   left wrist     surgical repair   ORIF ANKLE FRACTURE     right   HPI:  Allison Caldwell is a 77 y.o. female with PMH significant for DM2, HTN, morbid obesity who fell as she got out of her bed. She hit her head on the dresser. MRI Moderate-sized acute hemorrhagic right MCA infarct, small acute right ACA territory infarct, moderate chronic small vessel ischemic disease.    Assessment / Plan / Recommendation  Clinical Impression  Pt exhibits mild oral dysphagia with CN VII weakness on left with intermittent mild lingual residue. She is missing majority of dentition. Pt vocalizing while masticating and requried frequent cues to chew first, transit and swallow. Pharyngeal swallow appeared intact and was able to consume sips water via straw without s/s aspiration. Discussed various textures and pt felt that she did not need her meats chopped. Her lunch tray came which was regular texture and she was able to masticate the roast beef without pocketing over several trials. Recommend continue regular texture, thin liquids and therapist educated her to place food on intact right side oral cavity to masticate and use lingual sweep to check left side of oral cavity. Suspect ST will need to  follow up only once more for swallow to  reiterate strategies and efficiency with current texture. SLP Visit Diagnosis: Dysphagia, unspecified (R13.10)    Aspiration Risk  Mild aspiration risk    Diet Recommendation Regular;Thin liquid   Liquid Administration via: Cup;Straw Medication Administration: Whole meds with liquid Supervision: Staff to assist with self feeding;Full supervision/cueing for compensatory strategies Compensations: Small sips/bites;Slow rate;Lingual sweep for clearance of pocketing;Other (Comment) (masticate on right side oral cavity) Postural Changes: Seated upright at 90 degrees    Other  Recommendations Oral Care Recommendations: Oral care BID    Recommendations for follow up therapy are one component of a multi-disciplinary discharge planning process, led by the attending physician.  Recommendations may be updated based on patient status, additional functional criteria and insurance authorization.  Follow up Recommendations No SLP follow up      Assistance Recommended at Discharge    Functional Status Assessment Patient has had a recent decline in their functional status and demonstrates the ability to make significant improvements in function in a reasonable and predictable amount of time.  Frequency and Duration min 1 x/week  1 week       Prognosis Prognosis for improved oropharyngeal function: Good      Swallow Study   General Date of Onset: 03/26/23 HPI: Allison Caldwell is a 77 y.o. female with PMH significant for DM2, HTN, morbid obesity who fell as she got out of her bed. She hit her head on the dresser. MRI Moderate-sized  acute hemorrhagic right MCA infarct, small acute right ACA territory infarct, moderate chronic small vessel ischemic disease. Type of Study: Bedside Swallow Evaluation Previous Swallow Assessment:  (none) Diet Prior to this Study: Regular;Thin liquids (Level 0) Temperature Spikes Noted: Yes Respiratory Status: Nasal cannula History of Recent Intubation:  No Behavior/Cognition: Alert;Cooperative;Pleasant mood;Requires cueing Oral Cavity Assessment: Dry Oral Care Completed by SLP: No Oral Cavity - Dentition: Missing dentition Vision: Functional for self-feeding Self-Feeding Abilities: Needs assist Patient Positioning: Upright in bed Baseline Vocal Quality: Normal Volitional Cough: Strong Volitional Swallow: Able to elicit    Oral/Motor/Sensory Function Overall Oral Motor/Sensory Function: Mild impairment Facial ROM: Reduced left;Suspected CN VII (facial) dysfunction Facial Symmetry: Abnormal symmetry left;Suspected CN VII (facial) dysfunction Facial Strength: Reduced left;Suspected CN VII (facial) dysfunction   Ice Chips Ice chips: Not tested   Thin Liquid Thin Liquid: Within functional limits    Nectar Thick Nectar Thick Liquid: Not tested   Honey Thick Honey Thick Liquid: Not tested   Puree Puree: Within functional limits   Solid     Solid: Impaired Oral Phase Impairments: Reduced lingual movement/coordination Oral Phase Functional Implications: Oral residue Pharyngeal Phase Impairments:  (none)      Trini Christiansen, Breck Coons 03/26/2023,1:02 PM

## 2023-03-26 NOTE — Progress Notes (Signed)
  Echocardiogram 2D Echocardiogram has been performed.  Allison Caldwell 03/26/2023, 9:10 AM

## 2023-03-26 NOTE — Progress Notes (Addendum)
STROKE TEAM PROGRESS NOTE   INTERVAL HISTORY No family at bedside.  RN at bedside.  Patient is oriented, follows commands.  Her speech is still slurred however after looking at chart this may be a chronic symptom.   Patient is uncooperative with taking most medications this morning. K replaced via IV.   EKG and monitor have shown A-fib.  Patient stated she has had palpitations in the past.  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   Weaning cleviprex gtt, possible transfer out of ICU today.  Vitals:   03/26/23 1015 03/26/23 1030 03/26/23 1045 03/26/23 1106  BP: (!) 140/99 (!) 134/93 (!) 147/76   Pulse: (!) 105 (!) 110 (!) 104   Resp: 17 (!) 21 (!) 22   Temp:    (!) 100.6 F (38.1 C)  TempSrc:    Axillary  SpO2: 95% 95% 95%   Weight:      Height:       CBC:  Recent Labs  Lab 03/25/23 0550 03/26/23 0019  WBC 7.0 9.6  NEUTROABS 4.6  --   HGB 13.1 13.7  HCT 40.0 41.3  MCV 92.8 90.0  PLT 161 150    Basic Metabolic Panel:  Recent Labs  Lab 03/25/23 0550 03/25/23 1443 03/25/23 2116 03/26/23 0019  NA 135  --   --  133*  K 2.9*  --  2.8* 3.1*  CL 91*  --   --  93*  CO2 29  --   --  26  GLUCOSE 169*  --   --  121*  BUN 6*  --   --  5*  CREATININE 0.61  --   --  0.58  CALCIUM 9.3  --   --  8.9  PHOS  --  3.0  --   --     Lipid Panel:  Recent Labs  Lab 03/26/23 0019  CHOL 164  TRIG 55  HDL 99  CHOLHDL 1.7  VLDL 11  LDLCALC 54   HgbA1c: No results for input(s): "HGBA1C" in the last 168 hours. Urine Drug Screen:  Recent Labs  Lab 03/25/23 0812  LABOPIA NONE DETECTED  COCAINSCRNUR NONE DETECTED  LABBENZ NONE DETECTED  AMPHETMU NONE DETECTED  THCU NONE DETECTED  LABBARB NONE DETECTED     Alcohol Level  Recent Labs  Lab 03/25/23 1201  ETH <10     IMAGING past 24 hours ECHOCARDIOGRAM COMPLETE  Result Date: 03/26/2023    ECHOCARDIOGRAM REPORT   Patient Name:   Allison Caldwell Date of  Exam: 03/25/2023 Medical Rec #:  [id number]             Height:       64.0 in Accession #:    [id number]            Weight:       279.3 lb Date of Birth:  1946-06-29             BSA:          2.255 m Patient Age:    77 years              BP:           119/103 mmHg Patient Gender: F                     HR:           124 bpm. Exam Location:  Inpatient Procedure: 2D  Echo, Cardiac Doppler, Color Doppler and Intracardiac            Opacification Agent Indications:    Stroke  History:        Patient has prior history of Echocardiogram examinations, most                 recent 05/08/2022. Cardiomyopathy, TIA; Risk Factors:Obesity,                 Hypertension and Diabetes.  Sonographer:    Milda Smart Referring Phys: [facility]  Sonographer Comments: Technically difficult study due to poor echo windows. Image acquisition challenging due to patient body habitus, Image acquisition challenging due to uncooperative patient and Image acquisition challenging due to respiratory motion. IMPRESSIONS  1. Mild LVH with additional proximal septal thickening.  2. Left ventricular ejection fraction, by estimation, is 50 to 55%. The left ventricle has low normal function. The left ventricle has no regional wall motion abnormalities. There is moderate left ventricular hypertrophy. Left ventricular diastolic parameters are indeterminate.  3. Right ventricular systolic function is normal. The right ventricular size is normal. Tricuspid regurgitation signal is inadequate for assessing PA pressure.  4. The mitral valve is normal in structure. Trivial mitral valve regurgitation. No evidence of mitral stenosis.  5. The aortic valve is tricuspid. Aortic valve regurgitation is trivial. No aortic stenosis is present.  6. Aortic dilatation noted. There is mild dilatation of the ascending aorta, measuring 44 mm.  7. Moderately dilated pulmonary artery.  8. The inferior vena cava is dilated in size with >50% respiratory variability, suggesting  right atrial pressure of 8 mmHg. FINDINGS  Left Ventricle: Left ventricular ejection fraction, by estimation, is 50 to 55%. The left ventricle has low normal function. The left ventricle has no regional wall motion abnormalities. Definity contrast agent was given IV to delineate the left ventricular endocardial borders. The left ventricular internal cavity size was normal in size. There is moderate left ventricular hypertrophy. Left ventricular diastolic parameters are indeterminate. Right Ventricle: The right ventricular size is normal. Right ventricular systolic function is normal. Tricuspid regurgitation signal is inadequate for assessing PA pressure. Left Atrium: Left atrial size was normal in size. Right Atrium: Right atrial size was normal in size. Pericardium: There is no evidence of pericardial effusion. Mitral Valve: The mitral valve is normal in structure. Trivial mitral valve regurgitation. No evidence of mitral valve stenosis. Tricuspid Valve: The tricuspid valve is normal in structure. Tricuspid valve regurgitation is trivial. No evidence of tricuspid stenosis. Aortic Valve: The aortic valve is tricuspid. Aortic valve regurgitation is trivial. No aortic stenosis is present. Pulmonic Valve: The pulmonic valve was normal in structure. Pulmonic valve regurgitation is trivial. No evidence of pulmonic stenosis. Aorta: Aortic dilatation noted. There is mild dilatation of the ascending aorta, measuring 44 mm. Pulmonary Artery: The pulmonary artery is moderately dilated. Venous: The inferior vena cava is dilated in size with greater than 50% respiratory variability, suggesting right atrial pressure of 8 mmHg. IAS/Shunts: No atrial level shunt detected by color flow Doppler. Additional Comments: Mild LVH with additional proximal septal thickening.  LEFT VENTRICLE PLAX 2D LVIDd:         4.20 cm   Diastology LVIDs:         3.10 cm   LV e' medial:    5.84 cm/s LV PW:         1.10 cm   LV E/e' medial:  15.3 LV IVS:  1.10 cm   LV e' lateral:   7.46 cm/s LVOT diam:     2.00 cm   LV E/e' lateral: 11.9 LV SV:         64 LV SV Index:   28 LVOT Area:     3.14 cm  RIGHT VENTRICLE             IVC RV S prime:     10.10 cm/s  IVC diam: 2.30 cm TAPSE (M-mode): 2.5 cm LEFT ATRIUM           Index        RIGHT ATRIUM           Index LA diam:      3.90 cm 1.73 cm/m   RA Area:     19.40 cm LA Vol (A2C): 53.1 ml 23.55 ml/m  RA Volume:   50.10 ml  22.22 ml/m LA Vol (A4C): 67.1 ml 29.76 ml/m  AORTIC VALVE LVOT Vmax:   138.00 cm/s LVOT Vmean:  97.100 cm/s LVOT VTI:    0.204 m  AORTA Ao Root diam: 3.40 cm Ao Asc diam:  4.40 cm MITRAL VALVE               TRICUSPID VALVE MV Area (PHT): 4.97 cm    TR Peak grad:   9.1 mmHg MV Decel Time: 153 msec    TR Vmax:        151.00 cm/s MV E velocity: 89.10 cm/s                            SHUNTS                            Systemic VTI:  0.20 m                            Systemic Diam: 2.00 cm Olga Millers MD Electronically signed by Olga Millers MD Signature Date/Time: 03/26/2023/9:56:45 AM    Final    MR BRAIN W WO CONTRAST  Result Date: 03/25/2023 CLINICAL DATA:  Neuro deficit, acute, stroke suspected. EXAM: MRI HEAD WITHOUT AND WITH CONTRAST TECHNIQUE: Multiplanar, multiecho pulse sequences of the brain and surrounding structures were obtained without and with intravenous contrast. CONTRAST:  10mL GADAVIST GADOBUTROL 1 MMOL/ML IV SOLN COMPARISON:  Head CT and CTA 03/25/2023 FINDINGS: The study is intermittently up to moderately motion degraded. Brain: A moderate-sized region of cortical and subcortical edema, restricted diffusion, and hemorrhage in the right parietal lobe and posterior insula is most consistent with a hemorrhagic MCA infarct as suggesting on today's earlier CT. There is a small amount of associated non-masslike enhancement. There is a smaller acute infarct in the anteromedial right frontal lobe (ACA territory) with a small amount of petechial hemorrhage. Patchy T2  hyperintensities elsewhere in the cerebral white matter bilaterally are nonspecific but compatible with moderate chronic small vessel ischemic disease. A chronic lacunar infarct is noted in the right caudate nucleus. There is mild cerebral and cerebellar atrophy. No midline shift or extra-axial fluid collection is evident. Vascular: Major intracranial vascular flow voids are preserved. Skull and upper cervical spine: Unremarkable bone marrow signal. Sinuses/Orbits: Right cataract extraction. Opacified, expanded posterior right ethmoid air cells which may reflect a mucocele as described on CT. Small mucous retention cyst in the left maxillary sinus. Clear mastoid air cells. Other: None. IMPRESSION: 1.  Moderate-sized acute hemorrhagic right MCA infarct. 2. Small acute right ACA territory infarct. 3. Moderate chronic small vessel ischemic disease. Electronically Signed   By: Sebastian Ache M.D.   On: 03/25/2023 17:43    PHYSICAL EXAM General: sitting up comfortably in bed; in no acute distress.  HENT: Normal oropharynx and mucosa. Normal external appearance of ears and nose.  Neck: Supple, no pain or tenderness  CV: No JVD. No peripheral edema.  Pulmonary: Symmetric Chest rise. Normal respiratory effort.  Abdomen: Soft to touch, non-tender.  Ext: No cyanosis, edema, or deformity  Skin: No rash. Normal palpation of skin.   Musculoskeletal: Normal digits and nails by inspection. No clubbing.    Neurologic Examination  Mental status/Cognition: Alert, oriented to self, place, month and year, good attention.  Speech/language: dysarthric speech (baseline), fluent, comprehension intact, object naming intact, repetition intact.  Cranial nerves:   CN II Pupils equal and reactive to light, no VF deficits    CN III,IV,VI EOM intact, no gaze preference or deviation, no nystagmus    CN V normal sensation in V1, V2, and V3 segments bilaterally    CN VII Mild L facial droop   CN VIII normal hearing to speech    CN  IX & X normal palatal elevation, no uvular deviation    CN XI 5/5 head turn and 5/5 shoulder shrug bilaterally    CN XII midline tongue protrusion    Motor: Left-sided weakness, 4+/5 LUE and LLE. No drift BUE. Sensation: symmetric throughout Coordination: FNF with LUE ataxia.  Gait: deferred  ASSESSMENT/PLAN Allison Caldwell is a 77 y.o. female with history of DM2, HTN, morbid obesity  presenting with L sided weakness and found to have hemorrhagic transformation of R parietal stroke. Not on [facility], takes aspirin at home .   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 CTA head & neck No emergent LVO. Large main pulmonary artery as seen with pulmonary hypertension MRI: moderate right MCA and small R ACA infarcts 2D Echo: EF 50 to 55%  LDL 54 HgbA1c 9.8 UDS neg VTE prophylaxis - heparin subq  aspirin 81 mg daily prior to admission, now on No antithrombotic due to ICH. Patient will need DOAC for Afib once ICH resolves.    Therapy recommendations:  pending Disposition:  pending  PAF with RVR EKG shows Atrial Fibrillation Put on metoprolol 50 Consider DOAC once ICH resolves.  Hypertension Home meds: Benicar 40 mg home med Losartan 50mg  started, increased to 100mg  Lopressor 50mg  BID Cleviprex gtt, weaning as able. Hydralazine IVP.  SBP goal < 160 Long term BP goal normotensive  Hyperlipidemia Home meds: Pravastatin 20 mg LDL 54, goal < 70 Continue pravastatin 20 No high intensity statin due to LDL at goal Continue statin on discharge.  Diabetes type II Uncontrolled Home meds: Metformin 500 mg, insulin HgbA1c 9.8, goal < 7.0 CBGs SSI Close PCP follow-up for better DM control Resume metformin  Other Stroke Risk Factors Advanced Age >/= 53  Former Cigarette smoker Obesity, Body  mass index is 47.95 kg/m., BMI >/= 30 associated with increased stroke risk, recommend weight loss, diet and exercise as appropriate  Hx stroke/TIA Family hx stroke (brother) ? Obstructive sleep apnea, not on CPAP at home  Other Active Problems Mild hyponatremia, Na 133 Hypokalemia PO/IV replacement, recheck tonight EKG shows  Afib with prolonged QT, T-wave abnormality  Hospital day # 1   Pt seen by Neuro NP/APP and later by MD. Note/plan to be edited by MD as needed.    Lynnae January, DNP, AGACNP-BC [facility] Please use AMION for contact information & EPIC for messaging.  ATTENDING NOTE: I reviewed above note and agree with the assessment and plan. Pt was seen and examined.   No family at bedside.  RN at bedside. Pt has mild agitation, spit out po BP meds this am, has to go back to cleviprex. Also lost IV access, will need IV team consult.   On exam, pt is awake, alert, eyes open, orientated to age, place, time. No aphasia, fluent language, mild to moderate dysarthria, following all simple commands. Able to name and repeat in dysarthric voice. Right gaze preference, not cross midline, tracking on the right, right hemianopia vs. Visual neglect, PERRL. Left facial droop. Tongue midline. RUE 5/5, no drift. LUE flaccid. RLE 3/5 proximal and 4/5 distal and LLE 3-/5 proximal and 3/5 distal. Sensation and coordination not quite cooperative, gait not tested.   MRI showed right MCA and ACA infarct with HT, etiology likely due to new diagnosed afib. Will consider DOAC once ICH resolves. Continue statin. Increased po BP meds, taper off cleviprex as able. Aggressive stroke risk modification. Pending PT/OT  For detailed assessment and plan, please refer to above/below as I have made changes wherever appropriate.   Marvel Plan, MD PhD Stroke Neurology 03/26/2023 2:52 PM  This patient is critically ill due to stroke with hemorrhagic conversion, hypertensive emergency, PAF and at  significant risk of neurological worsening, death form recurrent stroke, hemorrhagic conversion, seizure, heart failure. This patient's care requires constant monitoring of vital signs, hemodynamics, respiratory and cardiac monitoring, review of multiple databases, neurological assessment, discussion with family, other specialists and medical decision making of high complexity. I spent 40 minutes of neurocritical care time in the care of this patient.    To contact Stroke Continuity provider, please refer to [email or web address]. After hours, contact General Neurology

## 2023-03-27 ENCOUNTER — Inpatient Hospital Stay (HOSPITAL_COMMUNITY): Payer: Medicare HMO

## 2023-03-27 DIAGNOSIS — I480 Paroxysmal atrial fibrillation: Secondary | ICD-10-CM | POA: Diagnosis not present

## 2023-03-27 DIAGNOSIS — I619 Nontraumatic intracerebral hemorrhage, unspecified: Secondary | ICD-10-CM | POA: Diagnosis not present

## 2023-03-27 DIAGNOSIS — R569 Unspecified convulsions: Secondary | ICD-10-CM | POA: Diagnosis not present

## 2023-03-27 LAB — CBC
HCT: 37.8 % (ref 36.0–46.0)
Hemoglobin: 12.3 g/dL (ref 12.0–15.0)
MCH: 29.8 pg (ref 26.0–34.0)
MCHC: 32.5 g/dL (ref 30.0–36.0)
MCV: 91.5 fL (ref 80.0–100.0)
Platelets: 176 10*3/uL (ref 150–400)
RBC: 4.13 MIL/uL (ref 3.87–5.11)
RDW: 16.1 % — ABNORMAL HIGH (ref 11.5–15.5)
WBC: 9.4 10*3/uL (ref 4.0–10.5)
nRBC: 0 % (ref 0.0–0.2)

## 2023-03-27 LAB — BASIC METABOLIC PANEL
Anion gap: 17 — ABNORMAL HIGH (ref 5–15)
BUN: 18 mg/dL (ref 8–23)
CO2: 23 mmol/L (ref 22–32)
Calcium: 7.9 mg/dL — ABNORMAL LOW (ref 8.9–10.3)
Chloride: 91 mmol/L — ABNORMAL LOW (ref 98–111)
Creatinine, Ser: 0.78 mg/dL (ref 0.44–1.00)
GFR, Estimated: >60 mL/min (ref >60)
Glucose, Bld: 114 mg/dL — ABNORMAL HIGH (ref 70–99)
Potassium: 3.3 mmol/L — ABNORMAL LOW (ref 3.5–5.1)
Sodium: 131 mmol/L — ABNORMAL LOW (ref 135–145)

## 2023-03-27 LAB — GLUCOSE, CAPILLARY
Glucose-Capillary: 82 mg/dL (ref 70–99)
Glucose-Capillary: 156 mg/dL — ABNORMAL HIGH (ref 70–99)
Glucose-Capillary: 130 mg/dL — ABNORMAL HIGH (ref 70–99)
Glucose-Capillary: 59 mg/dL — ABNORMAL LOW (ref 70–99)
Glucose-Capillary: 77 mg/dL (ref 70–99)

## 2023-03-27 MED ORDER — HYDRALAZINE HCL 20 MG/ML IJ SOLN
10.0000 mg | INTRAMUSCULAR | Status: DC | PRN
  Administered 2023-03-28 – 2023-04-04 (×10): 10 mg via INTRAVENOUS
  Filled 2023-03-27 (×11): qty 1

## 2023-03-27 MED ORDER — SODIUM CHLORIDE 0.9 % IV SOLN
3000.0000 mg | Freq: Once | INTRAVENOUS | Status: AC
  Administered 2023-03-27: 3000 mg via INTRAVENOUS
  Filled 2023-03-27: qty 30

## 2023-03-27 MED ORDER — POTASSIUM CHLORIDE CRYS ER 20 MEQ PO TBCR: 40.0000 meq | EXTENDED_RELEASE_TABLET | Freq: Once | ORAL | Status: DC

## 2023-03-27 MED ORDER — LORAZEPAM 2 MG/ML IJ SOLN
INTRAMUSCULAR | Status: AC
  Administered 2023-03-27: 2 mg via INTRAVENOUS
  Filled 2023-03-27: qty 1

## 2023-03-27 MED ORDER — LORAZEPAM 2 MG/ML IJ SOLN: 2.0000 mg | Freq: Once | INTRAMUSCULAR | Status: AC

## 2023-03-27 MED ORDER — POTASSIUM CHLORIDE CRYS ER 20 MEQ PO TBCR: 40.0000 meq | EXTENDED_RELEASE_TABLET | ORAL | Status: DC

## 2023-03-27 MED ORDER — POTASSIUM CHLORIDE 10 MEQ/100ML IV SOLN
10.0000 meq | INTRAVENOUS | Status: AC
  Administered 2023-03-27 (×4): 10 meq via INTRAVENOUS
  Filled 2023-03-27 (×4): qty 100

## 2023-03-27 MED ORDER — LEVETIRACETAM IN NACL 1000 MG/100ML IV SOLN
1000.0000 mg | Freq: Two times a day (BID) | INTRAVENOUS | Status: DC
  Administered 2023-03-27: 1000 mg via INTRAVENOUS
  Filled 2023-03-27: qty 100

## 2023-03-27 MED ORDER — LEVETIRACETAM IN NACL 500 MG/100ML IV SOLN
500.0000 mg | Freq: Two times a day (BID) | INTRAVENOUS | Status: DC
  Filled 2023-03-27: qty 100

## 2023-03-27 MED ORDER — LORAZEPAM 2 MG/ML IJ SOLN
INTRAMUSCULAR | Status: AC
  Filled 2023-03-27: qty 1

## 2023-03-27 NOTE — Evaluation (Signed)
Occupational Therapy Evaluation Patient Details Name: Allison Caldwell MRN: [id number] DOB: January 31, 1946 Today's Date: 03/27/2023   History of Present Illness Allison Caldwell is a 77 y.o. female who presented after a fall out of bed, now has L sided weakness. 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 PMHx:  DM2, HTN, morbid obesity   Clinical Impression   Allison Caldwell was evaluated s/p the above admission list. Per report, pt used SPC/DME intermittently and lives alone at baseline. Upon evaluation she was limited by impaired cognition with L inattention, nonsensical tangential speech, LUE sensory motor deficits, impaired balance, L lateral lean and decreased activity tolerance . Overall she required up to max/total A +3 for bed mobility with min-max A for sitting balance to correct L lateral lean. Pt attempted STS with RW and a lateral scoot transfer to drop arm recliner, however unable to safely complete therefore pt was dependently lifted from bed>chair via maxisky. Due to the deficits listed below she also requires total A +2 fro LB ADLs at bed level and mod-max A for UB ADLs in sitting. Maximal multimodal cues needed throughout for pt to functional participate in tasks. Pt will benefit from continued acute OT services and intensive inpatient follow up therapy, >3 hours/day after discharge.        Recommendations for follow up therapy are one component of a multi-disciplinary discharge planning process, led by the attending physician.  Recommendations may be updated based on patient status, additional functional criteria and insurance authorization.   Assistance Recommended at Discharge Frequent or constant Supervision/Assistance  Patient can return home with the following A lot of help with walking and/or transfers;Two people to help with walking and/or transfers;A lot of help with bathing/dressing/bathroom;Two people to help with  bathing/dressing/bathroom;Assistance with feeding;Assistance with cooking/housework;Direct supervision/assist for medications management;Direct supervision/assist for financial management;Assist for transportation;Help with stairs or ramp for entrance    Functional Status Assessment  Patient has had a recent decline in their functional status and demonstrates the ability to make significant improvements in function in a reasonable and predictable amount of time.  Equipment Recommendations  Other (comment) (defer)    Recommendations for Other Services Rehab consult     Precautions / Restrictions Precautions Precautions: Fall Precaution Comments: L inattention Restrictions Weight Bearing Restrictions: No      Mobility Bed Mobility Overal bed mobility: Needs Assistance Bed Mobility: Rolling, Sidelying to Sit, Sit to Supine Rolling: Max assist, +2 for physical assistance, +2 for safety/equipment Sidelying to sit: Max assist (+3)   Sit to supine: Total assist (+3)   General bed mobility comments: min A to roll to the L, max A to roll to the R. +3 needed for side>sit and sit>supine    Transfers Overall transfer level: Needs assistance Equipment used: 2 person hand held assist, Rolling walker (2 wheels), Ambulation equipment used Transfers: Sit to/from Stand, Bed to chair/wheelchair/BSC Sit to Stand: Total assist, +2 physical assistance, +2 safety/equipment (+3)          Lateral/Scoot Transfers: Total assist, +2 physical assistance, +2 safety/equipment (+3) General transfer comment: Significantly impiared by cognition, unable to safely follow commands to follow through with transfer. STS attempted with RW and +2, lateral scoot also attempted but unable to safely complete. Pt was dependently lifted with maxisky from bed>chair.      Balance Overall balance assessment: Needs assistance Sitting-balance support: Feet supported Sitting balance-Leahy Scale: Poor Sitting balance -  Comments: min-max A for sitting  balance with L lateral lean/LOB with fatigue                                   ADL either performed or assessed with clinical judgement   ADL Overall ADL's : Needs assistance/impaired Eating/Feeding: Moderate assistance   Grooming: Moderate assistance;Sitting   Upper Body Bathing: Maximal assistance;Sitting   Lower Body Bathing: Total assistance;+2 for physical assistance;+2 for safety/equipment;Bed level   Upper Body Dressing : Maximal assistance;Sitting   Lower Body Dressing: Total assistance;+2 for physical assistance;+2 for safety/equipment;Bed level   Toilet Transfer: Total assistance;+2 for physical assistance;+2 for safety/equipment Toilet Transfer Details (indicate cue type and reason): hoyer bed>chair Toileting- Clothing Manipulation and Hygiene: Total assistance;+2 for physical assistance;+2 for safety/equipment;Bed level       Functional mobility during ADLs: Total assistance;+2 for physical assistance;+2 for safety/equipment General ADL Comments: LUE faccid, L inattention, significantly impaired cognitiong, poor safety awareness, maximal multimodal cues needed to functionally participate     Vision Baseline Vision/History: 1 Wears glasses Vision Assessment?: Vision impaired- to be further tested in functional context Additional Comments: difficult to assess due to impaired cog. L inattention noted throughout, able to cross midline with maximal cues. Poor visual attention to L environment     Perception Perception Perception Tested?: No   Praxis Praxis Praxis tested?: Deficits    Pertinent Vitals/Pain Pain Assessment Pain Assessment: Faces Faces Pain Scale: Hurts a little bit Pain Location: back? with sitting EOB Pain Descriptors / Indicators: Discomfort Pain Intervention(s): Limited activity within patient's tolerance, Monitored during session     Hand Dominance Right   Extremity/Trunk Assessment Upper  Extremity Assessment Upper Extremity Assessment: RUE deficits/detail;LUE deficits/detail RUE Deficits / Details: seemingly WFL - unale to follow directions for full assessment. RUE Sensation: WNL RUE Coordination: WNL LUE Deficits / Details: Flaccid, no active movement noted. Difficult to get full assessment due to impiared cog LUE Sensation: decreased light touch;decreased proprioception LUE Coordination: decreased fine motor;decreased gross motor   Lower Extremity Assessment Lower Extremity Assessment: Defer to PT evaluation   Cervical / Trunk Assessment Cervical / Trunk Assessment: Other exceptions Cervical / Trunk Exceptions: increased body habitus   Communication Communication Communication: Other (comment) (nonsensical speech at time, tansgential)   Cognition Arousal/Alertness: Awake/alert Behavior During Therapy: Impulsive Overall Cognitive Status: Impaired/Different from baseline Area of Impairment: Orientation, Attention, Memory, Following commands, Safety/judgement, Awareness, Problem solving                 Orientation Level: Disoriented to, Situation, Time, Place Current Attention Level: Focused Memory: Decreased short-term memory, Decreased recall of precautions Following Commands: Follows one step commands inconsistently Safety/Judgement: Decreased awareness of safety, Decreased awareness of deficits Awareness: Intellectual Problem Solving: Slow processing, Decreased initiation, Difficulty sequencing, Requires verbal cues, Requires tactile cues General Comments: L inattention, very poor attention and required maximal multimodal cues. Nonsensical speech, self distracting. Poor insight, unable to follow basic commands to safely complete transfer     General Comments  [place] removed with SpO2 dropping to 87% on RA - 2L [place] donned for session with recovery to >90%            Home Living Family/patient expects to be discharged to:: Private residence Living  Arrangements: Alone Available Help at Discharge: Other (Comment) Type of Home: Apartment Home Access: Stairs to enter Entrance Stairs-Number of Steps: 6 Entrance Stairs-Rails: None Home Layout: One level     Bathroom Shower/Tub: Tub/shower unit  Bathroom Toilet: Standard     Home Equipment: Grab bars - tub/shower;Rolling Environmental consultant (2 wheels);Tub bench;Cane - single point          Prior Functioning/Environment Prior Level of Function : Needs assist             Mobility Comments: SPC intermittently for steps - questionable historian ADLs Comments: Uses door knob in bathroom for STS from toilet. per last admission, pt sponge bathes at the sink. Pt is a questionable historian        OT Problem List: Decreased strength;Decreased activity tolerance;Decreased range of motion;Impaired balance (sitting and/or standing);Decreased cognition;Decreased safety awareness;Decreased knowledge of use of DME or AE;Decreased knowledge of precautions;Impaired UE functional use;Impaired sensation      OT Treatment/Interventions: Self-care/ADL training;Therapeutic exercise;Neuromuscular education;DME and/or AE instruction;Therapeutic activities;Patient/family education;Balance training    OT Goals(Current goals can be found in the care plan section) Acute Rehab OT Goals Patient Stated Goal: to sit up OT Goal Formulation: With patient Time For Goal Achievement: 04/10/23 Potential to Achieve Goals: Fair ADL Goals Pt Will Perform Grooming: with min guard assist;sitting Pt Will Perform Upper Body Dressing: with min assist;sitting Pt Will Perform Lower Body Dressing: with max assist;sit to/from stand Pt Will Transfer to Toilet: with max assist;stand pivot transfer;bedside commode Additional ADL Goal #1: Pt will attend to L environment to locate ADL items with min cues as a precursor to self care  OT Frequency: Min 2X/week       AM-PAC OT "6 Clicks" Daily Activity     Outcome Measure Help from  another person eating meals?: A Lot Help from another person taking care of personal grooming?: A Lot Help from another person toileting, which includes using toliet, bedpan, or urinal?: Total Help from another person bathing (including washing, rinsing, drying)?: Total Help from another person to put on and taking off regular upper body clothing?: A Lot Help from another person to put on and taking off regular lower body clothing?: Total 6 Click Score: 9   End of Session Equipment Utilized During Treatment: Rolling walker (2 wheels);Gait belt;Oxygen Nurse Communication: Mobility status  Activity Tolerance: Patient tolerated treatment well Patient left: in chair;with call bell/phone within reach;with chair alarm set;with nursing/sitter in room  OT Visit Diagnosis: Unsteadiness on feet (R26.81);Other abnormalities of gait and mobility (R26.89);Muscle weakness (generalized) (M62.81);History of falling (Z91.81);Hemiplegia and hemiparesis Hemiplegia - Right/Left: Left Hemiplegia - dominant/non-dominant: Non-Dominant Hemiplegia - caused by: Cerebral infarction                Time: 0832-0909 OT Time Calculation (min): 37 min Charges:  OT General Charges $OT Visit: 1 Visit OT Evaluation $OT Eval Moderate Complexity: 1 Mod  Allison Caldwell, OTR/L Acute Rehabilitation Services Office [phone] Secure Chat Communication Preferred  Donia Pounds 03/27/2023, 10:22 AM

## 2023-03-27 NOTE — Progress Notes (Signed)
LTM EEG hooked up and running - no initial skin breakdown - push button tested - [facility] monitoring.CT leads used

## 2023-03-27 NOTE — Progress Notes (Signed)
Heparin subq ordered for patient. Per Neuro MD, hemorrhage is stable when comparing MRI to CT scan. Instructed RN that Heparin should be fine to administer.

## 2023-03-27 NOTE — Progress Notes (Signed)
Stroke Response Note:    Was asked to see pt, RN had heard that left arm was weak but now is flaccid. Pt also with Rt gaze and large NR rt pupil.     Discussed with Dr. Roda Shutters. Pt LE arm exam and rt gaze consistent with his exam yesterday. Per pt, rt pupil is chronically irregular due to surgery.    No action needed at this time. RN and Pt's family updated at bedside.  Verneda Skill RN Stroke Response

## 2023-03-27 NOTE — Progress Notes (Signed)
Inpatient Rehab Admissions Coordinator:   Per therapy recommendations, patient was screened for CIR candidacy by Megan Salon, MS, CCC-SLP. At this time, Pt. is total A and requires maxisky for OOB, She is not at a level to tolerate CIR at this time; however,  Pt. may have potential to progress to becoming a potential CIR candidate, so CIR admissions team will follow and monitor for progress and participation with therapies and place consult order if Pt. appears to be an appropriate candidate. Please contact me with any questions.   Megan Salon, MS, CCC-SLP Rehab Admissions Coordinator  [phone] (celll) [phone] (office)

## 2023-03-27 NOTE — Progress Notes (Signed)
  Transition of Care [facility]) Screening Note   Patient Details  Name: Mindy Braud Date of Birth: [birth year]   [facility]) CM/SW Contact:    Tom-Johnson, Hershal Coria, RN Phone Number: 03/27/2023, 3:53 PM  Patient is admitted after a fall at home and hitting head. Found to have Parietal Hemorrhagic Stroke. Currently on Continuous EEG, Neurology following. On 2L O2.  CM unable to assess patient at this time d/t altered level of consciousness.  CIR recommended and screened, screened for candidacy. Patient is not at a level to tolerate CIR at this time, CIR will follow for.   CM will continue t follow as patient progresses with care.

## 2023-03-27 NOTE — Progress Notes (Signed)
STROKE TEAM PROGRESS NOTE   INTERVAL HISTORY RN at the bedside. Pt sitting in chair, still has right gaze preference, left HH, left UE flaccid. LLE weakness. However, pt had two episodes of left arm twitching along with left eyelid twitching spreading to right eyelid. During the episodes, pt was able to talk and orientated. Each episode lasted about 2 min. During the encountered, she had 3 rd episode which I witnessed, lasted about 3 min, given ativan and put on keppra load, and LTM EEG ordered.   Vitals:   03/27/23 0929 03/27/23 1000 03/27/23 1103 03/27/23 1114  BP: (!) 154/97 (!) 149/89 (!) 141/86   Pulse:  80 61   Resp:  (!) 21 20   Temp:    98.7 F (37.1 C)  TempSrc:    Oral  SpO2:  98% 98%   Weight:      Height:       CBC:  Recent Labs  Lab 03/25/23 0550 03/26/23 0019 03/27/23 1136  WBC 7.0 9.6 9.4  NEUTROABS 4.6  --   --   HGB 13.1 13.7 12.3  HCT 40.0 41.3 37.8  MCV 92.8 90.0 91.5  PLT 161 150 176   Basic Metabolic Panel:  Recent Labs  Lab 03/25/23 1443 03/25/23 2116 03/26/23 0019 03/27/23 1136  NA  --   --  133* 131*  K  --    < > 3.1* 3.3*  CL  --   --  93* 91*  CO2  --   --  26 23  GLUCOSE  --   --  121* 114*  BUN  --   --  5* 18  CREATININE  --   --  0.58 0.78  CALCIUM  --   --  8.9 7.9*  PHOS 3.0  --   --   --    < > = values in this interval not displayed.   Lipid Panel:  Recent Labs  Lab 03/26/23 0019  CHOL 164  TRIG 55  HDL 99  CHOLHDL 1.7  VLDL 11  LDLCALC 54   HgbA1c: No results for input(s): "HGBA1C" in the last 168 hours. Urine Drug Screen:  Recent Labs  Lab 03/25/23 0812  LABOPIA NONE DETECTED  COCAINSCRNUR NONE DETECTED  LABBENZ NONE DETECTED  AMPHETMU NONE DETECTED  THCU NONE DETECTED  LABBARB NONE DETECTED    Alcohol Level  Recent Labs  Lab 03/25/23 1201  ETH <10    IMAGING past 24 hours No results found.  PHYSICAL EXAM  Temp:  [97.9 F (36.6 C)-100.2 F (37.9 C)] 98.7 F (37.1 C) (05/27 1114) Pulse Rate:   [61-94] 61 (05/27 1103) Resp:  [14-33] 20 (05/27 1103) BP: (111-212)/(61-152) 141/86 (05/27 1103) SpO2:  [92 %-100 %] 98 % (05/27 1103)  General - Well nourished, well developed, in no apparent distress.  Ophthalmologic - fundi not visualized due to noncooperation.  Cardiovascular - irregularly irregular heart rate and rhythm.  Neuro - awake, alert, eyes open, orientated to age, place, time. No aphasia, fluent language, mild to moderate dysarthria, following all simple commands. Able to name and repeat in dysarthric voice. Right gaze preference, not cross midline, tracking on the right, right hemianopia vs. Visual neglect, PERRL. Left facial droop. Tongue midline. RUE 5/5, no drift. LUE flaccid. RLE 3/5 proximal and 4/5 distal and LLE 3-/5 proximal and 3/5 distal. Sensation and coordination not quite cooperative, gait not tested. During the encounter, I witnessed an episode of left arm twitching then left eyelid twitching spreading  to right eyelid. During the episode, pt was able to talk and orientated. The episode lasted about 3-4 min.    ASSESSMENT/PLAN Allison Caldwell is a 77 y.o. female with history of DM2, HTN, morbid obesity  presenting with L sided weakness and found to have hemorrhagic transformation of R parietal stroke. Not on [facility], takes aspirin at home .   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 CTA head & neck No emergent LVO. Large main pulmonary artery as seen with pulmonary hypertension MRI: moderate right MCA and small R ACA infarcts 2D Echo: EF 50 to 55%  LDL 54 HgbA1c 9.8 UDS neg VTE prophylaxis - heparin subq  aspirin 81 mg daily prior to admission, now on No antithrombotic due to ICH. Patient will need DOAC for Afib once ICH resolves.    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 During the episodes, pt was able to talk and orientated. The episode lasted about 2-4 min. S/p ativan 2mg  x 1 Load keppra 3 g followed by keppra 500mg  bid LTM EEG pending  PAF with RVR EKG shows Atrial Fibrillation Put on metoprolol 50 Consider DOAC once ICH resolves.  Hypertension Home meds: Benicar 40 mg home med Losartan 50mg  started, increased to 100mg  Lopressor 50mg  BID Off cleviprex  SBP goal < 160 Long term BP goal normotensive  Hyperlipidemia Home meds: Pravastatin 20 mg LDL 54, goal < 70 Continue pravastatin 20 No high intensity statin due to LDL at goal Continue statin on discharge.  Diabetes type II Uncontrolled Home meds: Metformin 500 mg, insulin HgbA1c 9.8, goal < 7.0 CBGs SSI Close PCP follow-up for better DM control Resume metformin  Other Stroke Risk Factors Advanced Age >/= 58  Former Cigarette smoker Obesity, Body mass index is 47.95 kg/m., BMI >/= 30 associated with increased stroke risk, recommend weight loss, diet and exercise as appropriate  Family hx stroke (brother) ? Obstructive sleep apnea, not on CPAP at home  Other Active Problems Mild hyponatremia, Na 133->131 Hypokalemia, K 3.3, supplement, check Mg in am  Hospital day # 2   Marvel Plan, MD PhD Stroke Neurology 03/27/2023 12:33 PM  This patient is critically ill due to stroke with hemorrhagic conversion, hypertensive emergency, PAF, seizure and at significant risk of neurological worsening, death form recurrent stroke, hemorrhagic conversion, status epilepticus, heart failure. This patient's care requires constant monitoring of vital signs, hemodynamics, respiratory and cardiac monitoring, review of multiple databases, neurological assessment, discussion with family, other specialists and medical decision making of high  complexity. I spent 45 minutes of neurocritical care time in the care of this patient.    To contact Stroke Continuity provider, please refer to [email or web address]. After hours, contact General Neurology

## 2023-03-27 NOTE — Progress Notes (Signed)
SLP Cancellation Note  Patient Details Name: Allison Caldwell MRN: [id number] DOB: [birth year]   Cancelled treatment:       Reason Eval/Treat Not Completed: Other (comment). Pt being treated for focal seizures. Will f/u    Doc Mandala, Riley Nearing 03/27/2023, 2:12 PM

## 2023-03-27 NOTE — Evaluation (Signed)
Physical Therapy Evaluation Patient Details Name: Allison Caldwell MRN: [id number] DOB: 1945/11/19 Today's Date: 03/27/2023  History of Present Illness  [name] is a 77 y.o. female who presented after a fall out of bed, now has L sided weakness. 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 PMHx:  DM2, HTN, morbid obesity  Clinical Impression  Pt admitted with above diagnosis. Pt wasable to sit EOB with min assist of 2.  Pt could not stand or scoot to chair even with +3 assist due to poor cognition and weakness left hemibody.  Laid pt back down and used Maxi sky lift to get her to chair. Pt will need therapy and should tolerate >3 hours therapy a day. Will follow acutely.   Pt currently with functional limitations due to the deficits listed below (see PT Problem List). Pt will benefit from acute skilled PT to increase their independence and safety with mobility to allow discharge.          Recommendations for follow up therapy are one component of a multi-disciplinary discharge planning process, led by the attending physician.  Recommendations may be updated based on patient status, additional functional criteria and insurance authorization.  Follow Up Recommendations       Assistance Recommended at Discharge Frequent or constant Supervision/Assistance  Patient can return home with the following  Two people to help with walking and/or transfers;Two people to help with bathing/dressing/bathroom;Assistance with cooking/housework;Assist for transportation;Help with stairs or ramp for entrance    Equipment Recommendations Other (comment) (TBA)  Recommendations for Other Services  Rehab consult    Functional Status Assessment Patient has had a recent decline in their functional status and demonstrates the ability to make significant improvements in function in a reasonable and predictable amount of time.     Precautions /  Restrictions Precautions Precautions: Fall Precaution Comments: L inattention Restrictions Weight Bearing Restrictions: No      Mobility  Bed Mobility Overal bed mobility: Needs Assistance Bed Mobility: Rolling, Sidelying to Sit, Sit to Supine Rolling: Max assist, +2 for physical assistance, +2 for safety/equipment Sidelying to sit: Max assist (+3)   Sit to supine: Total assist (+3)   General bed mobility comments: min A to roll to the L, max A to roll to the R. +3 needed for side>sit and sit>supine    Transfers Overall transfer level: Needs assistance Equipment used: 2 person hand held assist, Rolling walker (2 wheels), Ambulation equipment used Transfers: Sit to/from Stand, Bed to chair/wheelchair/BSC Sit to Stand: Total assist, +2 physical assistance, +2 safety/equipment (+3)          Lateral/Scoot Transfers: Total assist, +2 physical assistance, +2 safety/equipment (+3) General transfer comment: Significantly impiared by cognition, unable to safely follow commands to follow through with transfer. STS attempted with RW and +2, lateral scoot also attempted but unable to safely complete. Pt was dependently lifted with maxisky from bed>chair.    Ambulation/Gait                  Stairs            Wheelchair Mobility    Modified Rankin (Stroke Patients Only)       Balance Overall balance assessment: Needs assistance Sitting-balance support: Feet supported Sitting balance-Leahy Scale: Poor Sitting balance - Comments: min-max A for sitting balance with L lateral lean/LOB with fatigue  Pertinent Vitals/Pain Pain Assessment Pain Assessment: Faces Faces Pain Scale: Hurts a little bit Pain Location: back? with sitting EOB Pain Descriptors / Indicators: Discomfort Pain Intervention(s): Limited activity within patient's tolerance, Monitored during session, Repositioned    Home Living Family/patient  expects to be discharged to:: Private residence Living Arrangements: Alone Available Help at Discharge: Other (Comment) Type of Home: Apartment Home Access: Stairs to enter Entrance Stairs-Rails: None Entrance Stairs-Number of Steps: 6   Home Layout: One level Home Equipment: Grab bars - tub/shower;Rolling Walker (2 wheels);Tub bench;Cane - single point      Prior Function Prior Level of Function : Needs assist             Mobility Comments: SPC intermittently for steps - questionable historian ADLs Comments: Uses door knob in bathroom for STS from toilet. per last admission, pt sponge bathes at the sink. Pt is a questionable historian     Hand Dominance   Dominant Hand: Right    Extremity/Trunk Assessment   Upper Extremity Assessment Upper Extremity Assessment: Defer to OT evaluation RUE Deficits / Details: seemingly WFL - unale to follow directions for full assessment. RUE Sensation: WNL RUE Coordination: WNL LUE Deficits / Details: Flaccid, no active movement noted. Difficult to get full assessment due to impiared cog LUE Sensation: decreased light touch;decreased proprioception LUE Coordination: decreased fine motor;decreased gross motor    Lower Extremity Assessment Lower Extremity Assessment: RLE deficits/detail;LLE deficits/detail RLE Deficits / Details: Appears WFL but difficult to assess due to pt cognition LLE Deficits / Details: appears grossly 2+/5, difficult to assess    Cervical / Trunk Assessment Cervical / Trunk Assessment: Other exceptions Cervical / Trunk Exceptions: increased body habitus  Communication   Communication: Other (comment) (nonsensical speech at time, tansgential)  Cognition Arousal/Alertness: Awake/alert Behavior During Therapy: Impulsive Overall Cognitive Status: Impaired/Different from baseline Area of Impairment: Orientation, Attention, Memory, Following commands, Safety/judgement, Awareness, Problem solving                  Orientation Level: Disoriented to, Situation, Time, Place Current Attention Level: Focused Memory: Decreased short-term memory, Decreased recall of precautions Following Commands: Follows one step commands inconsistently Safety/Judgement: Decreased awareness of safety, Decreased awareness of deficits Awareness: Intellectual Problem Solving: Slow processing, Decreased initiation, Difficulty sequencing, Requires verbal cues, Requires tactile cues General Comments: L inattention, very poor attention and required maximal multimodal cues. Nonsensical speech, self distracting. Poor insight, unable to follow basic commands to safely complete transfer        General Comments General comments (skin integrity, edema, etc.): [place] removed with SpO2 dropping to 87% on RA - 2L  donned for session with recovery to >90%    Exercises General Exercises - Lower Extremity Long Arc Quad: AROM, Both, 5 reps, Seated Heel Slides: AAROM, Left, 5 reps, Supine   Assessment/Plan    PT Assessment Patient needs continued PT services  PT Problem List Decreased activity tolerance;Decreased balance;Decreased mobility;Decreased knowledge of use of DME;Decreased coordination;Decreased cognition;Decreased strength;Decreased range of motion;Decreased safety awareness;Obesity       PT Treatment Interventions DME instruction;Functional mobility training;Therapeutic activities;Therapeutic exercise;Balance training;Patient/family education;Wheelchair mobility training;Neuromuscular re-education;Cognitive remediation    PT Goals (Current goals can be found in the Care Plan section)  Acute Rehab PT Goals Patient Stated Goal: to go home PT Goal Formulation: With patient Time For Goal Achievement: 04/11/23 Potential to Achieve Goals: Good    Frequency Min 4X/week     Co-evaluation PT/OT/SLP Co-Evaluation/Treatment: Yes Reason for Co-Treatment: Complexity of the patient's impairments (  multi-system involvement);For  patient/therapist safety PT goals addressed during session: Mobility/safety with mobility         AM-PAC PT "6 Clicks" Mobility  Outcome Measure Help needed turning from your back to your side while in a flat bed without using bedrails?: A Little Help needed moving from lying on your back to sitting on the side of a flat bed without using bedrails?: Total Help needed moving to and from a bed to a chair (including a wheelchair)?: Total Help needed standing up from a chair using your arms (e.g., wheelchair or bedside chair)?: Total Help needed to walk in hospital room?: Total Help needed climbing 3-5 steps with a railing? : Total 6 Click Score: 8    End of Session Equipment Utilized During Treatment: Gait belt;Oxygen Activity Tolerance: Patient limited by fatigue Patient left: in chair;with call bell/phone within reach;with chair alarm set Nurse Communication: Mobility status;Need for lift equipment PT Visit Diagnosis: Muscle weakness (generalized) (M62.81);Hemiplegia and hemiparesis;Other abnormalities of gait and mobility (R26.89) Hemiplegia - Right/Left: Left Hemiplegia - dominant/non-dominant: Non-dominant Hemiplegia - caused by: Cerebral infarction    Time: 0832-0909 PT Time Calculation (min) (ACUTE ONLY): 37 min   Charges:   PT Evaluation $PT Eval Moderate Complexity: 1 Mod          Cagney Steenson M,PT Acute Rehab Services [phone]   Bevelyn Buckles 03/27/2023, 12:50 PM

## 2023-03-28 ENCOUNTER — Inpatient Hospital Stay (HOSPITAL_COMMUNITY): Payer: Medicare HMO

## 2023-03-28 DIAGNOSIS — R569 Unspecified convulsions: Secondary | ICD-10-CM | POA: Diagnosis not present

## 2023-03-28 DIAGNOSIS — I619 Nontraumatic intracerebral hemorrhage, unspecified: Secondary | ICD-10-CM | POA: Diagnosis not present

## 2023-03-28 LAB — PHOSPHORUS
Phosphorus: 3.4 mg/dL (ref 2.5–4.6)
Phosphorus: 3.4 mg/dL (ref 2.5–4.6)

## 2023-03-28 LAB — GLUCOSE, CAPILLARY
Glucose-Capillary: 74 mg/dL (ref 70–99)
Glucose-Capillary: 79 mg/dL (ref 70–99)
Glucose-Capillary: 57 mg/dL — ABNORMAL LOW (ref 70–99)
Glucose-Capillary: 60 mg/dL — ABNORMAL LOW (ref 70–99)
Glucose-Capillary: 77 mg/dL (ref 70–99)
Glucose-Capillary: 148 mg/dL — ABNORMAL HIGH (ref 70–99)
Glucose-Capillary: 133 mg/dL — ABNORMAL HIGH (ref 70–99)

## 2023-03-28 LAB — MAGNESIUM
Magnesium: 1.2 mg/dL — ABNORMAL LOW (ref 1.7–2.4)
Magnesium: 1.3 mg/dL — ABNORMAL LOW (ref 1.7–2.4)
Magnesium: 2 mg/dL (ref 1.7–2.4)

## 2023-03-28 LAB — CBC
HCT: 37.4 % (ref 36.0–46.0)
Hemoglobin: 12.3 g/dL (ref 12.0–15.0)
MCH: 31.1 pg (ref 26.0–34.0)
MCHC: 32.9 g/dL (ref 30.0–36.0)
MCV: 94.4 fL (ref 80.0–100.0)
Platelets: 162 10*3/uL (ref 150–400)
RBC: 3.96 MIL/uL (ref 3.87–5.11)
RDW: 16.1 % — ABNORMAL HIGH (ref 11.5–15.5)
WBC: 8.5 10*3/uL (ref 4.0–10.5)
nRBC: 0 % (ref 0.0–0.2)

## 2023-03-28 LAB — HEMOGLOBIN A1C
Hgb A1c MFr Bld: 8.8 % — ABNORMAL HIGH (ref 4.8–5.6)
Mean Plasma Glucose: 206 mg/dL

## 2023-03-28 LAB — BASIC METABOLIC PANEL
Anion gap: 11 (ref 5–15)
BUN: 20 mg/dL (ref 8–23)
CO2: 26 mmol/L (ref 22–32)
Calcium: 7.9 mg/dL — ABNORMAL LOW (ref 8.9–10.3)
Chloride: 92 mmol/L — ABNORMAL LOW (ref 98–111)
Creatinine, Ser: 0.59 mg/dL (ref 0.44–1.00)
GFR, Estimated: >60 mL/min (ref >60)
Glucose, Bld: 88 mg/dL (ref 70–99)
Potassium: 4.5 mmol/L (ref 3.5–5.1)
Sodium: 129 mmol/L — ABNORMAL LOW (ref 135–145)

## 2023-03-28 LAB — VALPROIC ACID LEVEL: Valproic Acid Lvl: 60 ug/mL (ref 50.0–100.0)

## 2023-03-28 MED ORDER — METFORMIN HCL 500 MG PO TABS
500.0000 mg | ORAL_TABLET | Freq: Two times a day (BID) | ORAL | Status: DC
  Filled 2023-03-28 (×2): qty 1

## 2023-03-28 MED ORDER — LEVETIRACETAM IN NACL 1000 MG/100ML IV SOLN
1000.0000 mg | INTRAVENOUS | Status: AC
  Administered 2023-03-28: 1000 mg via INTRAVENOUS
  Filled 2023-03-28: qty 100

## 2023-03-28 MED ORDER — PROSOURCE TF20 ENFIT COMPATIBL EN LIQD
60.0000 mL | Freq: Every day | ENTERAL | Status: DC
  Administered 2023-03-28 – 2023-04-03 (×7): 60 mL
  Filled 2023-03-28 (×7): qty 60

## 2023-03-28 MED ORDER — VALPROATE SODIUM 100 MG/ML IV SOLN
2500.0000 mg | Freq: Once | INTRAVENOUS | Status: AC
  Administered 2023-03-28: 2500 mg via INTRAVENOUS
  Filled 2023-03-28: qty 25

## 2023-03-28 MED ORDER — MELATONIN 3 MG PO TABS
3.0000 mg | ORAL_TABLET | Freq: Every day | ORAL | Status: DC
  Administered 2023-03-28 – 2023-04-09 (×13): 3 mg
  Filled 2023-03-28 (×13): qty 1

## 2023-03-28 MED ORDER — PANTOPRAZOLE SODIUM 40 MG IV SOLR
40.0000 mg | INTRAVENOUS | Status: DC
  Administered 2023-03-28 – 2023-04-08 (×12): 40 mg via INTRAVENOUS
  Filled 2023-03-28 (×12): qty 10

## 2023-03-28 MED ORDER — SENNOSIDES 8.8 MG/5ML PO SYRP
5.0000 mL | ORAL_SOLUTION | Freq: Two times a day (BID) | ORAL | Status: DC
  Administered 2023-03-28 – 2023-04-02 (×9): 5 mL
  Filled 2023-03-28 (×9): qty 5

## 2023-03-28 MED ORDER — SODIUM CHLORIDE 0.9 % IV SOLN: INTRAVENOUS | Status: AC

## 2023-03-28 MED ORDER — MAGNESIUM SULFATE 4 GM/100ML IV SOLN
4.0000 g | Freq: Once | INTRAVENOUS | Status: AC
  Administered 2023-03-28: 4 g via INTRAVENOUS
  Filled 2023-03-28: qty 100

## 2023-03-28 MED ORDER — LEVETIRACETAM IN NACL 1500 MG/100ML IV SOLN
1500.0000 mg | Freq: Two times a day (BID) | INTRAVENOUS | Status: DC
  Administered 2023-03-28 – 2023-03-31 (×7): 1500 mg via INTRAVENOUS
  Filled 2023-03-28 (×8): qty 100

## 2023-03-28 MED ORDER — QUETIAPINE FUMARATE 25 MG PO TABS
25.0000 mg | ORAL_TABLET | Freq: Every day | ORAL | Status: DC
  Administered 2023-03-28: 25 mg
  Filled 2023-03-28: qty 1

## 2023-03-28 MED ORDER — LORAZEPAM 2 MG/ML IJ SOLN
1.0000 mg | INTRAMUSCULAR | Status: DC | PRN
  Administered 2023-03-28 – 2023-04-01 (×4): 1 mg via INTRAVENOUS
  Filled 2023-03-28 (×4): qty 1

## 2023-03-28 MED ORDER — INSULIN ASPART 100 UNIT/ML IJ SOLN
0.0000 [IU] | INTRAMUSCULAR | Status: DC
  Administered 2023-03-28 (×2): 1 [IU] via SUBCUTANEOUS
  Administered 2023-03-29 (×2): 2 [IU] via SUBCUTANEOUS

## 2023-03-28 MED ORDER — PRAVASTATIN SODIUM 40 MG PO TABS
20.0000 mg | ORAL_TABLET | Freq: Every day | ORAL | Status: DC
  Administered 2023-03-28 – 2023-04-09 (×13): 20 mg
  Filled 2023-03-28 (×13): qty 1

## 2023-03-28 MED ORDER — SODIUM CHLORIDE 0.9 % IV SOLN
200.0000 mg | Freq: Two times a day (BID) | INTRAVENOUS | Status: DC
  Administered 2023-03-28 – 2023-03-31 (×6): 200 mg via INTRAVENOUS
  Filled 2023-03-28 (×7): qty 20

## 2023-03-28 MED ORDER — LOSARTAN POTASSIUM 50 MG PO TABS
100.0000 mg | ORAL_TABLET | Freq: Every day | ORAL | Status: DC
  Administered 2023-03-29 – 2023-04-10 (×13): 100 mg
  Filled 2023-03-28 (×13): qty 2

## 2023-03-28 MED ORDER — OSMOLITE 1.5 CAL PO LIQD
1000.0000 mL | ORAL | Status: DC
  Administered 2023-03-28 – 2023-04-03 (×6): 1000 mL
  Filled 2023-03-28 (×2): qty 1000

## 2023-03-28 MED ORDER — VALPROATE SODIUM 100 MG/ML IV SOLN
500.0000 mg | Freq: Two times a day (BID) | INTRAVENOUS | Status: DC
  Administered 2023-03-28 – 2023-03-30 (×4): 500 mg via INTRAVENOUS
  Filled 2023-03-28 (×6): qty 5

## 2023-03-28 MED ORDER — SERTRALINE HCL 100 MG PO TABS
100.0000 mg | ORAL_TABLET | Freq: Every day | ORAL | Status: DC
  Administered 2023-03-29 – 2023-04-10 (×13): 100 mg
  Filled 2023-03-28 (×13): qty 1

## 2023-03-28 MED ORDER — METOPROLOL TARTRATE 50 MG PO TABS
50.0000 mg | ORAL_TABLET | Freq: Two times a day (BID) | ORAL | Status: DC
  Administered 2023-03-28 – 2023-04-06 (×17): 50 mg
  Filled 2023-03-28 (×18): qty 1

## 2023-03-28 NOTE — Inpatient Diabetes Management (Signed)
Inpatient Diabetes Program Recommendations  AACE/ADA: New Consensus Statement on Inpatient Glycemic Control (2015)  Target Ranges:  Prepandial:   less than 140 mg/dL      Peak postprandial:   less than 180 mg/dL (1-2 hours)      Critically ill patients:  140 - 180 mg/dL   Lab Results  Component Value Date   GLUCAP 74 03/28/2023   HGBA1C 8.8 (H) 03/25/2023    Review of Glycemic Control  Latest Reference Range & Units 03/27/23 07:16 03/27/23 11:09 03/27/23 15:00 03/27/23 21:38 03/27/23 21:46 03/28/23 07:51  Glucose-Capillary 70 - 99 mg/dL 82 161 (H) 096 (H) 59 (L) 77 74   Diabetes history: DM 2 Outpatient Diabetes medications: Metformin 500 mg bid Current orders for Inpatient glycemic control:  Novolog 0-20 units tid + hs Novolog 6 units tid meal coverage  A1c 8.8% on 5/25 Note PO intake variable, hypoglycemia yesterday  Inpatient Diabetes Program Recommendations:    -  Reduce Novolog Correction to 0-15 units tid + hs -  Reduce meal coverage to 3 units tid  Thanks,  Christena Deem RN, MSN, BC-ADM Inpatient Diabetes Coordinator Team Pager [phone] (8a-5p)

## 2023-03-28 NOTE — Progress Notes (Signed)
SLP Cancellation Note  Patient Details Name: Allison Caldwell MRN: [id number] DOB: September 16, 1946   Cancelled treatment:       Reason Eval/Treat Not Completed: Patient's level of consciousness. Patient has been having seizures today and is not appropriate for trying PO's at this time. SLP will f/u next date.   Angela Nevin, MA, CCC-SLP Speech Therapy

## 2023-03-28 NOTE — Procedures (Addendum)
Patient Name: Allison Caldwell  MRN: [id number]  Epilepsy Attending: Charlsie Quest  Referring Physician/Provider: Marvel Plan, MD  Duration: 03/27/2023 1218 to 03/28/2023 1218  Patient history: 77yo F with Right MCA and ACA infarcts now with left arm and eye twitching episodes. EEG to evaluate for seizure  Level of alertness: Awake, asleep  AEDs during EEG study: LEV  Technical aspects: This EEG study was done with scalp electrodes positioned according to the 10-20 International system of electrode placement. Electrical activity was reviewed with band pass filter of 1-70Hz , sensitivity of 7 uV/mm, display speed of 48mm/sec with a 60Hz  notched filter applied as appropriate. EEG data were recorded continuously and digitally stored.  Video monitoring was available and reviewed as appropriate.  Description: The posterior dominant rhythm consists of 9 Hz activity of moderate voltage (25-35 uV) seen predominantly in posterior head regions, asymmetric ( right<left) and reactive to eye opening and eye closing. Sleep was characterized by vertex waves, sleep spindles (12 to 14 Hz), maximal frontocentral region.  EEG showed continuous 3 to 6 Hz theta-delta slowing in right hemisphere.   41 seizure was noted arising from  right centro-parietal region. During seizure, patient was noted to have left face, shoulder and arm twitching and at times no clinical signs were seen. EEG showed sharp waves in right centro-parietal region admixed with 2-3Hz  delta slowing and overriding 12-13 hz beta activity which then involved all of right para-sagittal leads and evolved into 2-2.5hz  spike and wave pattern. Duration of seizure was from 1 to 5 minutes.    ABNORMALITY - Focal seizure, right centro-parietal region  - Continuous slow, right hemisphere.   IMPRESSION: This study showed 41 seizure arising from  right centro-parietal region, lasting 1 to 5 minutes. During seizure, patient was noted to have left face,  shoulder and arm twitching and at times no clinical signs were seen.   Additionally study was suggestive of cortical dysfunction arising from right hemisphere likely secondary to underlying structural abnormality  Dr. Wilford Corner was notified.  Shanikka Wonders Annabelle Harman

## 2023-03-28 NOTE — Procedures (Signed)
Cortrak  Person Inserting Tube:  Joane Postel T, RD Tube Type:  Cortrak - 43 inches Tube Size:  10 Tube Location:  Left nare Secured by: Bridle Technique Used to Measure Tube Placement:  Marking at nare/corner of mouth Cortrak Secured At:  73 cm   Cortrak Tube Team Note:  Consult received to place a Cortrak feeding tube.   X-ray is required, abdominal x-ray has been ordered by the Cortrak team. Please confirm tube placement before using the Cortrak tube.   If the tube becomes dislodged please keep the tube and contact the Cortrak team at [email or web address] for replacement.  If after hours and replacement cannot be delayed, place a NG tube and confirm placement with an abdominal x-ray.    Allison Caldwell RD, LDN For contact information, refer to [facility].   

## 2023-03-28 NOTE — Progress Notes (Signed)
Reattached EEG electrode FP2.

## 2023-03-28 NOTE — Progress Notes (Signed)
Initial Nutrition Assessment  DOCUMENTATION CODES:   Morbid obesity  INTERVENTION:   Initiate tube feeds via Cortrak: - Start Osmolite 1.5 @ 20 ml/hr and advance rate by 10 ml q 6 hours to goal rate of 50 ml/hr (1200 ml/day) - PROSource TF20 60 ml daily  Tube feeding regimen at goal rate provides 1880 kcal, 95 grams of protein, and 914 ml of H2O.   NUTRITION DIAGNOSIS:   Inadequate oral intake related to lethargy/confusion as evidenced by NPO status.  GOAL:   Patient will meet greater than or equal to 90% of their needs  MONITOR:   Diet advancement, Labs, Weight trends, TF tolerance  REASON FOR ASSESSMENT:   Consult Enteral/tube feeding initiation and management  ASSESSMENT:   77 year old female who presented to the ED on 5/28 after a fall. PMH of T2DM, HTN, HLD. Pt admitted with ICH.  05/25 - diet advanced to Carb Modified 05/28 - concern for seizures, NPO, Cortrak placed (x-ray reading is pending)  Discussed pt with RN and during ICU rounds. Consult received for enteral nutrition initiation and management.  Spoke with pt at bedside. Pt unable to provide reliable diet and weight history at this time due to lethargy and AMS. Pt reports that she did not eat dinner last night because she took a medication that made her sleepy. RN reports pt was able to consume some applesauce last night but has not eaten anything today. Pt made NPO at 1146 today.  Meal completion of 50% noted for lunch meal on 5/25. Noted meal completions of 10%, 10%, and 25% documented on 5/26. Given poor PO intake and unknown diet history PTA, will start tube feeds at trickle rate and slowly advance to goal. RN aware of plan.  Pt with mild pitting generalized edema and mild pitting edema to BLE.  Meal Completion: 10-50% x 4 documented meals  Medications reviewed and include: SSI q 4 hours, melatonin, metformin, IV protonix, senokot, IV magnesium sulfate 4 grams x 1 IVF: NS @ 50 ml/hr  Labs reviewed:  sodium 129, chloride 92, magnesium 1.2, hemoglobin A1C 8.8 CBG's: 59-130 x 24 hours  UOP: 450 ml x 24 hours  NUTRITION - FOCUSED PHYSICAL EXAM:  Flowsheet Row Most Recent Value  Orbital Region No depletion  Upper Arm Region No depletion  Thoracic and Lumbar Region No depletion  Buccal Region No depletion  Temple Region No depletion  Clavicle Bone Region No depletion  Clavicle and Acromion Bone Region Mild depletion  Scapular Bone Region Unable to assess  Dorsal Hand No depletion  Patellar Region No depletion  Anterior Thigh Region No depletion  Posterior Calf Region Moderate depletion  Edema (RD Assessment) Mild  Hair Reviewed  Eyes Reviewed  Mouth Reviewed  Skin Reviewed  Nails Reviewed       Diet Order:   Diet Order             Diet NPO time specified  Diet effective now                   EDUCATION NEEDS:   Not appropriate for education at this time  Skin:  Skin Assessment: Reviewed RN Assessment  Last BM:  03/24/23  Height:   Ht Readings from Last 1 Encounters:  03/25/23 5\' 4"  (1.626 m)    Weight:   Wt Readings from Last 1 Encounters:  03/25/23 126.7 kg    Ideal Body Weight:  54.5 kg  BMI:  Body mass index is 47.95 kg/m.  Estimated Nutritional  Needs:   Kcal:  1700-1900  Protein:  90-105 grams  Fluid:  1.7-1.9 L    Mertie Clause, MS, RD, LDN Inpatient Clinical Dietitian Please see [facility] for contact information.

## 2023-03-28 NOTE — Progress Notes (Signed)
This RN and NT transported patient to CT and back with no complications.

## 2023-03-28 NOTE — Progress Notes (Signed)
STROKE TEAM PROGRESS NOTE   INTERVAL HISTORY RN at the bedside. Pt lying in bed, drowsy sleepy, briefly open eyes on voice but fall back to sleep. Mumbling words not quit intangible. Overnight again seizure activity and LTM showed 23 seizures rising from right hemisphere, keppra was increased and put on depakote.    Vitals:   03/28/23 1030 03/28/23 1045 03/28/23 1100 03/28/23 1130  BP: (!) 144/78 (!) 157/98 135/86   Pulse: 79 82 70   Resp: 19 13 18    Temp:    (!) 97.5 F (36.4 C)  TempSrc:    Oral  SpO2: 100% 100% 100%   Weight:      Height:       CBC:  Recent Labs  Lab 03/25/23 0550 03/26/23 0019 03/27/23 1136 03/28/23 0029  WBC 7.0   < > 9.4 8.5  NEUTROABS 4.6  --   --   --   HGB 13.1   < > 12.3 12.3  HCT 40.0   < > 37.8 37.4  MCV 92.8   < > 91.5 94.4  PLT 161   < > 176 162   < > = values in this interval not displayed.   Basic Metabolic Panel:  Recent Labs  Lab 03/25/23 1443 03/25/23 2116 03/27/23 1136 03/28/23 0029  NA  --    < > 131* 129*  K  --    < > 3.3* 4.5  CL  --    < > 91* 92*  CO2  --    < > 23 26  GLUCOSE  --    < > 114* 88  BUN  --    < > 18 20  CREATININE  --    < > 0.78 0.59  CALCIUM  --    < > 7.9* 7.9*  MG  --   --   --  1.2*  PHOS 3.0  --   --   --    < > = values in this interval not displayed.   Lipid Panel:  Recent Labs  Lab 03/26/23 0019  CHOL 164  TRIG 55  HDL 99  CHOLHDL 1.7  VLDL 11  LDLCALC 54   HgbA1c:  Recent Labs  Lab 03/25/23 0847  HGBA1C 8.8*   Urine Drug Screen:  Recent Labs  Lab 03/25/23 0812  LABOPIA NONE DETECTED  COCAINSCRNUR NONE DETECTED  LABBENZ NONE DETECTED  AMPHETMU NONE DETECTED  THCU NONE DETECTED  LABBARB NONE DETECTED    Alcohol Level  Recent Labs  Lab 03/25/23 1201  ETH <10    IMAGING past 24 hours Overnight EEG with video  Result Date: 03/28/2023 Allison Quest, MD     03/28/2023  4:08 AM Patient Name: Allison Caldwell MRN: [id number] Epilepsy Attending: Charlsie Caldwell  Referring Physician/Provider: Marvel Plan, MD Duration: 03/27/2023 1218 to 03/28/2023 0400 Patient history: 77yo F with Right MCA and ACA infarcts now with left arm and eye twitching episodes. EEG to evaluate for seizure Level of alertness: Awake, asleep AEDs during EEG study: LEV Technical aspects: This EEG study was done with scalp electrodes positioned according to the 10-20 International system of electrode placement. Electrical activity was reviewed with band pass filter of 1-70Hz , sensitivity of 7 uV/mm, display speed of 71mm/sec with a 60Hz  notched filter applied as appropriate. EEG data were recorded continuously and digitally stored.  Video monitoring was available and reviewed as appropriate. Description: The posterior dominant rhythm consists of 9 Hz activity of moderate voltage (25-35  uV) seen predominantly in posterior head regions, asymmetric ( right<left) and reactive to eye opening and eye closing. Sleep was characterized by vertex waves, sleep spindles (12 to 14 Hz), maximal frontocentral region.  EEG showed continuous 3 to 6 Hz theta-delta slowing in right hemisphere. Twenty three seizure was noted arising from  right centro-parietal region. During seizure, patient was noted to have left face, shoulder and arm twitching and at times no clinical signs were seen. EEG showed sharp waves in right centro-parietal region admixed with 2-3Hz  delta slowing and overriding 12-13 hz beta activity which then involved all of right para-sagittal leads and evolved into 2-2.5hz  spike and wave pattern. Duration of seizure was from 1 to 5 minutes.  ABNORMALITY - Focal seizure, right centro-parietal region - Continuous slow, right hemisphere. IMPRESSION: This study showed twenty three seizure arising from  right centro-parietal region, lasting 1 to 5 minutes. During seizure, patient was noted to have left face, shoulder and arm twitching and at times no clinical signs were seen. Additionally study was suggestive of  cortical dysfunction arising from right hemisphere likely secondary to underlying structural abnormality Dr. Wilford Corner was notified. Allison Caldwell   CT HEAD WO CONTRAST ( )  Result Date: 03/28/2023 CLINICAL DATA:  Stroke suspected EXAM: CT HEAD WITHOUT CONTRAST TECHNIQUE: Contiguous axial images were obtained from the base of the skull through the vertex without intravenous contrast. RADIATION DOSE REDUCTION: This exam was performed according to the departmental dose-optimization program which includes automated exposure control, adjustment of the mA and/or kV according to patient size and/or use of iterative reconstruction technique. COMPARISON:  CT head 01/23/2023 FINDINGS: Evaluation is somewhat limited by beam hardening artifact, which may be related to leads on the patient's calvarium. Brain: Redemonstrated patchy hemorrhage in the right posterior frontal and parietal lobes, the overall area of which measures approximately 2.8 x 3.6 x 4.7 cm, previously 2.7 x 3.6 x 4.5 cm, likely unchanged, although some areas demonstrate slightly more hyperdense hemorrhage than on the prior exam. Hypodensity in the surrounding an intervening parenchyma, which remains consistent with prior suspicions for infarct related hemorrhage. New hypodensity in the anterior right frontal lobe (series 5, image 28) correlates with the right ACA territory infarct noted on the prior MRI. Evidence hemorrhagic transformation. No new acute infarct, mass, mass effect, or midline shift. No hydrocephalus or extra-axial collection. Vascular: No hyperdense vessel. Skull: Negative for fracture or focal lesion. Sinuses/Orbits: Mucous retention cyst in the left maxillary sinus. Partial opacification of the right ethmoid air cells. No acute finding in the orbits. Other: The mastoids are well aerated. IMPRESSION: 1. Evaluation is somewhat limited by beam hardening artifact, which may be related to leads on the patient's calvarium. Within this  limitation, redemonstrated patchy hemorrhage in the right posterior frontal and parietal lobes, which is likely unchanged in size, although some areas demonstrate slightly more hyperdense hemorrhage than on the prior exam. This remains consistent with hemorrhagic transformation a right MCA infarct. 2. New hypodensity in the right ACA territory correlates with the acute infarct noted on the recent MRI. No evidence of hemorrhagic transformation. Electronically Signed   By: Allison Caldwell M.D.   On: 03/28/2023 01:53    PHYSICAL EXAM  Temp:  [97.5 F (36.4 C)-99.8 F (37.7 C)] 97.5 F (36.4 C) (05/28 1130) Pulse Rate:  [55-89] 70 (05/28 1100) Resp:  [12-33] 18 (05/28 1100) BP: (61-244)/(17-128) 135/86 (05/28 1100) SpO2:  [94 %-100 %] 100 % (05/28 1100)  General - obese, well developed, in no apparent  distress.  Ophthalmologic - fundi not visualized due to noncooperation.  Cardiovascular - irregularly irregular heart rate and rhythm.  Neuro - drowsy sleepy, eyes closed but briefly open on voice but not able to keep eyes open without constant stimulation, mumbling words but intangible. Only following limited simple commands on the right hand. Left gaze preference today but able to cross midline, PERRL. Left facial droop. Tongue midline. RUE spontaneous movement. LUE flaccid. BLEs withdraw to pain with toe movement. Sensation and coordination not quite cooperative, gait not tested. No seizure activity seen.    ASSESSMENT/PLAN Ms. Celest Breckner is a 77 y.o. female with history of DM2, HTN, morbid obesity  presenting with L sided weakness and found to have hemorrhagic transformation of R parietal stroke. Not on [facility], takes aspirin at home .   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 CTA head & neck No  emergent LVO. Large main pulmonary artery as seen with pulmonary hypertension MRI: moderate right MCA and small R ACA infarcts 2D Echo: EF 50 to 55%  LDL 54 HgbA1c 9.8 UDS neg VTE prophylaxis - heparin subq  aspirin 81 mg daily prior to admission, now on No antithrombotic due to ICH. Patient will need DOAC for Afib once ICH resolves.    Therapy recommendations:  pending Disposition:  pending  Focal seizure from right brain 3 episodes of left arm rhythmic twitching then left eyelid twitching spreading to right eyelid. During the episodes, pt was able to talk and orientated. The episode lasted about 2-4 min. S/p ativan 2mg  x 1 Load keppra 3 g followed by keppra 500mg  bid -> 1500 bid Add depakote 500 bid after loading LTM EEG 5/27-28 showed 23 seizure arising from  right centro-parietal region, lasting 1 to 5 minutes.  Continue LTM  PAF with RVR EKG shows Atrial Fibrillation Put on metoprolol 50 Consider DOAC once ICH resolves.  Hypertension Home meds: Benicar 40 mg home med Losartan 50mg  started, increased to 100mg  Lopressor 50mg  BID Off cleviprex  SBP goal < 160 Long term BP goal normotensive  Hyperlipidemia Home meds: Pravastatin 20 mg LDL 54, goal < 70 Continue pravastatin 20 No high intensity statin due to LDL at goal Continue statin on discharge.  Diabetes type II Uncontrolled Home meds: Metformin 500 mg, insulin HgbA1c 9.8, goal < 7.0 CBGs SSI Close PCP follow-up for better DM control Resume metformin  Dysphagia  NPO now due to mental status Will put in cortrak Will start TF SSI Q4h  Other Stroke Risk Factors Advanced Age >/= 64  Former Cigarette smoker Obesity, Body mass index is 47.95 kg/m., BMI >/= 30 associated with increased stroke risk, recommend weight loss, diet and exercise as appropriate  Family hx stroke (brother) ? Obstructive sleep apnea, not on CPAP at home  Other Active Problems Mild hyponatremia, Na 133->131 Hypokalemia, K  3.3->4.5 Hypomagnesemia - Mg 1.2, supplement  Hospital day # 3  This patient is critically ill due to stroke with hemorrhagic conversion, hypertensive emergency, PAF, seizure and at significant risk of neurological worsening, death form recurrent stroke, hemorrhagic conversion, status epilepticus, heart failure. This patient's care requires constant monitoring of vital signs, hemodynamics, respiratory and cardiac monitoring, review of multiple databases, neurological assessment, discussion with family, other specialists and medical decision making of high complexity. I spent 45 minutes of neurocritical care time  in the care of this patient.  Marvel Plan, MD PhD Stroke Neurology 03/28/2023 11:37 AM      To contact Stroke Continuity provider, please refer to [email or web address]. After hours, contact General Neurology

## 2023-03-28 NOTE — Progress Notes (Signed)
PT Cancellation Note  Patient Details Name: Allison Caldwell MRN: [id number] DOB: 1946/08/12   Cancelled Treatment:    Reason Eval/Treat Not Completed: Medical issues which prohibited therapy (Pt with active seizures per nurse.  Nurse asked PT to HOLD.)   Bevelyn Buckles 03/28/2023, 11:35 AM Petros Ahart M,PT Acute Rehab Services [phone]

## 2023-03-28 NOTE — Progress Notes (Addendum)
On call overnight note  Called by 3 MICU RN for the patient-continues with left facial and left wrist twitching. Evaluated the patient at bedside. Continues to have left face and wrist twitching with preserved consciousness. Severely dysarthric.  Able to have simple conversations and follow simple commands but is easily distractible.  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  They are concerned about the sedating effects of medications and are reluctant to try medications but I told him that without trying benzodiazepines and adding a third antiepileptic, it might be hard to control this focal motor seizures.  We had discussions about the need for intubation but they are not very keen on intubation but would like to try things to be done without having to intubate.  Recommendations Ativan 1 mg every 2 hours as needed for seizures Add Vimpat 200 mg twice daily first dose now to the antiepileptic regimen Continue Keppra and Depakote Check Depakote level Neurology will continue to follow  Addendum 1230 am 03/29/2023 Called by RN-patient has more coughing. Added Robitussin via tube  -- Milon Dikes, MD Neurologist [facility] Pager: [phone]   Additional 30 min cc time spent in the care of the patient and family communication for management of focal status epilepticus

## 2023-03-28 NOTE — Progress Notes (Addendum)
On call coverage note  Called by the patient's RN for concern for seizure because of left arm shaking and tremors.  She is on LTM EEG. Patient also less cooperative and more agitated at this time. Evaluated the patient in the ICU She is awake alert oriented to self and the fact that she is in the hospital.  She has moderate to severe dysarthria.  She follows some simple commands inconsistently.  She has a right gaze preference and does not cross midline and has a left hemianopsia with visual field neglect. There is mild left facial droop.  Left upper extremity flaccidity.  Left lower extremity is also weaker than the documented exam of 3/5 to barely 2/5 at this time-effort dependent versus late in the day tired? Left arm has a continuous twitch at the wrist with rhythmic jerking.  I ordered a stat CT head-agree with the radiology evaluation below IMPRESSION: 1. Evaluation is somewhat limited by beam hardening artifact, which may be related to leads on the patient's calvarium. Within this limitation, redemonstrated patchy hemorrhage in the right posterior frontal and parietal lobes, which is likely unchanged in size, although some areas demonstrate slightly more hyperdense hemorrhage than on the prior exam. This remains consistent with hemorrhagic transformation a right MCA infarct. 2. New hypodensity in the right ACA territory correlates with the acute infarct noted on the recent MRI. No evidence of hemorrhagic transformation.  LTM reviewed at bedside-did not not show seizures but focal motor seizures may be missed by the EEG. Formal EEG read with frequent seizures.  Assessment Right MCA and ACA stroke with hemorrhagic transformation of the right parietal region-stroke likely due to newly diagnosed A-fib Frequent focal motor seizures  Recommendations Increased dose of Keppra to 1000 twice daily last night - will up further to 1500 BID.  Also giving 1 additional dose of 1000 mg Keppra IV  now. Will add Depakote - load 20 mg/kg followed by 500 mg BID Continue LTM EEG May need further escalation of the dose of antiepileptics based on clinical course and EEG results. Maintain seizure precautions    -- Milon Dikes, MD Neurologist [facility] Pager: [phone]  Additional 40 min of cc time

## 2023-03-28 NOTE — Progress Notes (Signed)
vLTM maintenance  All impedances below 10kohms.  No skin breakdown noted at FP1  FP2  C4  P4  T4

## 2023-03-29 DIAGNOSIS — R569 Unspecified convulsions: Secondary | ICD-10-CM | POA: Diagnosis not present

## 2023-03-29 DIAGNOSIS — I619 Nontraumatic intracerebral hemorrhage, unspecified: Secondary | ICD-10-CM | POA: Diagnosis not present

## 2023-03-29 LAB — BASIC METABOLIC PANEL
Anion gap: 10 (ref 5–15)
BUN: 21 mg/dL (ref 8–23)
CO2: 26 mmol/L (ref 22–32)
Calcium: 8 mg/dL — ABNORMAL LOW (ref 8.9–10.3)
Chloride: 96 mmol/L — ABNORMAL LOW (ref 98–111)
Creatinine, Ser: 0.73 mg/dL (ref 0.44–1.00)
GFR, Estimated: >60 mL/min (ref >60)
Glucose, Bld: 183 mg/dL — ABNORMAL HIGH (ref 70–99)
Potassium: 4 mmol/L (ref 3.5–5.1)
Sodium: 132 mmol/L — ABNORMAL LOW (ref 135–145)

## 2023-03-29 LAB — GLUCOSE, CAPILLARY
Glucose-Capillary: 186 mg/dL — ABNORMAL HIGH (ref 70–99)
Glucose-Capillary: 195 mg/dL — ABNORMAL HIGH (ref 70–99)
Glucose-Capillary: 174 mg/dL — ABNORMAL HIGH (ref 70–99)
Glucose-Capillary: 150 mg/dL — ABNORMAL HIGH (ref 70–99)
Glucose-Capillary: 180 mg/dL — ABNORMAL HIGH (ref 70–99)
Glucose-Capillary: 162 mg/dL — ABNORMAL HIGH (ref 70–99)

## 2023-03-29 LAB — PHOSPHORUS
Phosphorus: 4 mg/dL (ref 2.5–4.6)
Phosphorus: 2.5 mg/dL (ref 2.5–4.6)

## 2023-03-29 LAB — CBC
HCT: 35.4 % — ABNORMAL LOW (ref 36.0–46.0)
Hemoglobin: 11.7 g/dL — ABNORMAL LOW (ref 12.0–15.0)
MCH: 31.2 pg (ref 26.0–34.0)
MCHC: 33.1 g/dL (ref 30.0–36.0)
MCV: 94.4 fL (ref 80.0–100.0)
Platelets: 172 10*3/uL (ref 150–400)
RBC: 3.75 MIL/uL — ABNORMAL LOW (ref 3.87–5.11)
RDW: 15.9 % — ABNORMAL HIGH (ref 11.5–15.5)
WBC: 6.1 10*3/uL (ref 4.0–10.5)
nRBC: 0 % (ref 0.0–0.2)

## 2023-03-29 LAB — MAGNESIUM
Magnesium: 2.1 mg/dL (ref 1.7–2.4)
Magnesium: 1.9 mg/dL (ref 1.7–2.4)

## 2023-03-29 MED ORDER — QUETIAPINE FUMARATE 25 MG PO TABS
25.0000 mg | ORAL_TABLET | Freq: Every day | ORAL | Status: DC
  Administered 2023-03-29: 25 mg
  Filled 2023-03-29: qty 1

## 2023-03-29 MED ORDER — POLYETHYLENE GLYCOL 3350 17 G PO PACK: 17.0000 g | PACK | Freq: Every day | ORAL | Status: DC | PRN

## 2023-03-29 MED ORDER — GUAIFENESIN 100 MG/5ML PO LIQD
5.0000 mL | ORAL | Status: DC | PRN
  Administered 2023-03-29 – 2023-04-01 (×5): 5 mL
  Filled 2023-03-29 (×5): qty 10

## 2023-03-29 MED ORDER — INSULIN ASPART 100 UNIT/ML IJ SOLN
0.0000 [IU] | INTRAMUSCULAR | Status: DC
  Administered 2023-03-29: 2 [IU] via SUBCUTANEOUS
  Administered 2023-03-29 – 2023-03-30 (×4): 3 [IU] via SUBCUTANEOUS
  Administered 2023-03-30: 5 [IU] via SUBCUTANEOUS
  Administered 2023-03-30: 3 [IU] via SUBCUTANEOUS
  Administered 2023-03-30 – 2023-03-31 (×3): 5 [IU] via SUBCUTANEOUS
  Administered 2023-03-31: 3 [IU] via SUBCUTANEOUS

## 2023-03-29 MED ORDER — POLYETHYLENE GLYCOL 3350 17 G PO PACK
17.0000 g | PACK | Freq: Two times a day (BID) | ORAL | Status: AC
  Administered 2023-03-29: 17 g
  Filled 2023-03-29: qty 1

## 2023-03-29 NOTE — Progress Notes (Signed)
Physical Therapy Treatment Patient Details Name: Allison Caldwell MRN: [id number] DOB: [birth year] Today's Date: 03/29/2023   History of Present Illness [name] is a 77 y.o. female who presented after a fall out of bed, now has L sided weakness. 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 Witnessed seizures during admission. Undergoing LTM EEG. PMHx:  DM2, HTN, morbid obesity    PT Comments    Pt with new onset seizures during admission, now undergoing LTM EEG. Pt very lethargic. Opening eyes on command but only sustaining for a few seconds. Following simple commands consistently on right side. No active movement noted on left. +2 max assist bed mobility, for repositioning. SpO2 93-95% on RA. Pt in bed, in semi chair position at end of session.    Recommendations for follow up therapy are one component of a multi-disciplinary discharge planning process, led by the attending physician.  Recommendations may be updated based on patient status, additional functional criteria and insurance authorization.  Follow Up Recommendations       Assistance Recommended at Discharge Frequent or constant Supervision/Assistance  Patient can return home with the following Two people to help with walking and/or transfers;Two people to help with bathing/dressing/bathroom;Assistance with cooking/housework;Assist for transportation;Help with stairs or ramp for entrance   Equipment Recommendations  Other (comment) (TBA)    Recommendations for Other Services       Precautions / Restrictions Precautions Precautions: Fall;Other (comment) Precaution Comments: L inattention, undergoing LTM EEG during session Restrictions Weight Bearing Restrictions: No     Mobility  Bed Mobility     Rolling: Max assist, +2 for physical assistance         General bed mobility comments: repositioned in bed.    Transfers                         Ambulation/Gait                   Stairs             Wheelchair Mobility    Modified Rankin (Stroke Patients Only)       Balance                                            Cognition Arousal/Alertness: Awake/alert Behavior During Therapy: Flat affect Overall Cognitive Status: Difficult to assess                                 General Comments: Very lethargic. Opening eyes on command but only sustaining a few seconds. Following simple commands consistently on R (wiggle your toes, squeeze my hand, lift your leg).        Exercises      General Comments General comments (skin integrity, edema, etc.): SpO2 93-95% on RA. HR 67-73. BP 144/90      Pertinent Vitals/Pain Pain Assessment Pain Assessment: Faces Faces Pain Scale: Hurts a little bit Pain Location: generalized Pain Descriptors / Indicators: Grimacing Pain Intervention(s): Repositioned    Home Living                          Prior Function            PT  Goals (current goals can now be found in the care plan section) Acute Rehab PT Goals Patient Stated Goal: not stated Progress towards PT goals: Not progressing toward goals - comment (seizures)    Frequency    Min 4X/week      PT Plan Current plan remains appropriate    Co-evaluation              AM-PAC PT "6 Clicks" Mobility   Outcome Measure  Help needed turning from your back to your side while in a flat bed without using bedrails?: Total Help needed moving from lying on your back to sitting on the side of a flat bed without using bedrails?: Total Help needed moving to and from a bed to a chair (including a wheelchair)?: Total Help needed standing up from a chair using your arms (e.g., wheelchair or bedside chair)?: Total Help needed to walk in hospital room?: Total Help needed climbing 3-5 steps with a railing? : Total 6 Click Score: 6    End of Session   Activity  Tolerance: Patient limited by fatigue Patient left: in bed;with call bell/phone within reach Nurse Communication: Mobility status PT Visit Diagnosis: Muscle weakness (generalized) (M62.81);Hemiplegia and hemiparesis;Other abnormalities of gait and mobility (R26.89) Hemiplegia - Right/Left: Left Hemiplegia - dominant/non-dominant: Non-dominant Hemiplegia - caused by: Cerebral infarction     Time: 7564-3329 PT Time Calculation (min) (ACUTE ONLY): 10 min  Charges:  $Therapeutic Activity: 8-22 mins                     Ferd Glassing., PT  Office # [phone]    Ilda Foil 03/29/2023, 11:57 AM

## 2023-03-29 NOTE — Procedures (Signed)
Patient Name: Allison Caldwell  MRN: [id number]  Epilepsy Attending: Charlsie Quest  Referring Physician/Provider: Marvel Plan, MD  Duration: 03/28/2023 1218 to 03/29/2023 1218   Patient history: 77yo F with Right MCA and ACA infarcts now with left arm and eye twitching episodes. EEG to evaluate for seizure   Level of alertness: Awake, asleep   AEDs during EEG study: LEV, LCM, VPA   Technical aspects: This EEG study was done with scalp electrodes positioned according to the 10-20 International system of electrode placement. Electrical activity was reviewed with band pass filter of 1-70Hz , sensitivity of 7 uV/mm, display speed of 30mm/sec with a 60Hz  notched filter applied as appropriate. EEG data were recorded continuously and digitally stored.  Video monitoring was available and reviewed as appropriate.   Description: The posterior dominant rhythm consists of 9 Hz activity of moderate voltage (25-35 uV) seen predominantly in posterior head regions, asymmetric ( right<left) and reactive to eye opening and eye closing. Sleep was characterized by vertex waves, sleep spindles (12 to 14 Hz), maximal frontocentral region.  EEG showed continuous 3 to 6 Hz theta-delta slowing in right hemisphere. Intermittent generalized 3-6hz  theta-delta slowing was alsonoted.   Six seizure was noted arising from  right centro-parietal region. During seizure, patient was noted to have left face, shoulder and arm twitching and at times no clinical signs were seen. EEG showed sharp waves in right centro-parietal region admixed with 2-3Hz  delta slowing and overriding 12-13 hz beta activity which then involved all of right para-sagittal leads and evolved into 2-2.5hz  spike and wave pattern. Duration of seizure was from 1 to 2 minutes.  Last seizure was noted on 03/28/2023 at 1958.    ABNORMALITY - Focal seizure, right centro-parietal region  - Continuous slow, right hemisphere.  - Intermittent slow, generalized    IMPRESSION: This study showed six seizure arising from  right centro-parietal region, lasting 1 to 2 minutes. During seizure, patient was noted to have left face, shoulder and arm twitching and at times no clinical signs were seen. Last seizure was noted on 03/28/2023 at 1958.    Additionally study was suggestive of cortical dysfunction arising from right hemisphere likely secondary to underlying structural abnormality. Lastly there was mild diffuse encephalopathy.      Gera Inboden Annabelle Harman

## 2023-03-29 NOTE — Progress Notes (Addendum)
STROKE TEAM PROGRESS NOTE   INTERVAL HISTORY RN at the bedside. Pt lying in bed, eyes open, more awake alert than yesterday, able to following commands, and engage conversation, mild to moderate dysarthria, still has left UE and LE weakness but improving. Right gaze also improved and now able to attend both sides.     Vitals:   03/29/23 1800 03/29/23 1947 03/29/23 1955 03/29/23 2000  BP: (!) 148/99   (!) 179/110  Pulse: 83  80 77  Resp: (!) 21  (!) 27 20  Temp:  98.7 F (37.1 C)    TempSrc:  Oral    SpO2: 94%  100% 96%  Weight:      Height:       CBC:  Recent Labs  Lab 03/25/23 0550 03/26/23 0019 03/28/23 0029 03/29/23 0644  WBC 7.0   < > 8.5 6.1  NEUTROABS 4.6  --   --   --   HGB 13.1   < > 12.3 11.7*  HCT 40.0   < > 37.4 35.4*  MCV 92.8   < > 94.4 94.4  PLT 161   < > 162 172   < > = values in this interval not displayed.   Basic Metabolic Panel:  Recent Labs  Lab 03/28/23 0029 03/28/23 1227 03/29/23 0644 03/29/23 1803  NA 129*  --  132*  --   K 4.5  --  4.0  --   CL 92*  --  96*  --   CO2 26  --  26  --   GLUCOSE 88  --  183*  --   BUN 20  --  21  --   CREATININE 0.59  --  0.73  --   CALCIUM 7.9*  --  8.0*  --   MG 1.2*   < > 2.1 1.9  PHOS  --    < > 4.0 2.5   < > = values in this interval not displayed.   Lipid Panel:  Recent Labs  Lab 03/26/23 0019  CHOL 164  TRIG 55  HDL 99  CHOLHDL 1.7  VLDL 11  LDLCALC 54   HgbA1c:  Recent Labs  Lab 03/25/23 0847  HGBA1C 8.8*   Urine Drug Screen:  Recent Labs  Lab 03/25/23 0812  LABOPIA NONE DETECTED  COCAINSCRNUR NONE DETECTED  LABBENZ NONE DETECTED  AMPHETMU NONE DETECTED  THCU NONE DETECTED  LABBARB NONE DETECTED    Alcohol Level  Recent Labs  Lab 03/25/23 1201  ETH <10    IMAGING past 24 hours No results found.  PHYSICAL EXAM  Temp:  [97.6 F (36.4 C)-98.7 F (37.1 C)] 98.7 F (37.1 C) (05/29 1947) Pulse Rate:  [56-96] 77 (05/29 2000) Resp:  [10-29] 20 (05/29 2000) BP:  (88-179)/(55-113) 179/110 (05/29 2000) SpO2:  [90 %-100 %] 96 % (05/29 2000)  General - obese, well developed, lethargic.  Ophthalmologic - fundi not visualized due to noncooperation.  Cardiovascular - irregularly irregular heart rate and rhythm.  Neuro - lethargic but eyes open spontaneously, mild to moderate dysarthria, following all simple commands, no aphasia, conversing well. Right gaze preference but able to cross midline with incomplete left gaze, left pupil 3mm, right pupil enlarged, irregular sharp, consistent with previous surgery. Left facial droop. Tongue midline. RUE spontaneous movement. LUE 2+/5 proximal. RLE 3/5 proximal and 4/5 distal. LLE proximal and distal 2+/5. Sensation and coordination not quite cooperative, gait not tested. No seizure activity seen.    ASSESSMENT/PLAN [name] is  a 77 y.o. female with history of DM2, HTN, morbid obesity  presenting with L sided weakness and found to have hemorrhagic transformation of R parietal stroke. Not on [facility], takes aspirin at home .   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 CTA head & neck No emergent LVO. Large main pulmonary artery as seen with pulmonary hypertension MRI: moderate right MCA and small R ACA infarcts 2D Echo: EF 50 to 55%  LDL 54 HgbA1c 9.8 UDS neg VTE prophylaxis - heparin subq  aspirin 81 mg daily prior to admission, now on No antithrombotic due to ICH. Patient will need DOAC for Afib once ICH resolves.    Therapy recommendations:  pending Disposition:  pending  Focal seizure from right brain 5/27 - 3 episodes of left arm rhythmic twitching then left eyelid twitching spreading to right eyelid. During the episodes, pt was able to talk and orientated. The episode lasted about 2-4 min. S/p ativan 2mg  x  2 Load keppra 3 g followed by keppra 500mg  bid -> 1500 bid Add depakote 500 bid after loading Add vimpat 200 bid LTM EEG 5/27-28 showed 23 seizure arising from  right centro-parietal region, lasting 1 to 5 minutes.  LTM EEG 7/28-29 showed 6 seizure arising from  right centro-parietal region, lasting 1 to 2 minutes.  Continue LTM Depokate level 60->pending Once stable, will transition IV AEDs to po   PAF with RVR EKG shows Atrial Fibrillation Put on metoprolol 50 Consider DOAC once ICH resolves.  Hypertension Home meds: Benicar 40 mg home med Losartan 50mg  started, increased to 100mg  Lopressor 50mg  BID Off cleviprex  SBP goal < 160 Long term BP goal normotensive  Hyperlipidemia Home meds: Pravastatin 20 mg LDL 54, goal < 70 Continue pravastatin 20 No high intensity statin due to LDL at goal Continue statin on discharge.  Diabetes type II Uncontrolled Home meds: Metformin 500 mg, insulin HgbA1c 9.8, goal < 7.0 CBGs SSI Close PCP follow-up for better DM control  Dysphagia  NPO now due to mental status Placed cortrak On TF @ 50cc SSI Q4h  Other Stroke Risk Factors Advanced Age >/= 22  Former Cigarette smoker Obesity, Body mass index is 47.95 kg/m., BMI >/= 30 associated with increased stroke risk, recommend weight loss, diet and exercise as appropriate  Family hx stroke (brother) ? Obstructive sleep apnea, not on CPAP at home  Other Active Problems Mild hyponatremia, Na 133->131->132 Hypokalemia, K 3.3->4.5->4.0 Hypomagnesemia - Mg 1.2->2.1->1.9   Hospital day # 4  This patient is critically ill due to stroke with hemorrhagic conversion, hypertensive emergency, PAF, seizure and at significant risk of neurological worsening, death form recurrent stroke, hemorrhagic conversion, status epilepticus, heart failure. This patient's care requires constant monitoring of vital signs, hemodynamics, respiratory and cardiac monitoring, review of multiple databases,  neurological assessment, discussion with family, other specialists and medical decision making of high complexity. I spent 35 minutes of neurocritical care time in the care of this patient.  Marvel Plan, MD PhD Stroke Neurology 03/29/2023 10:36 PM      To contact Stroke Continuity provider, please refer to [email or web address]. After hours, contact General Neurology

## 2023-03-29 NOTE — Progress Notes (Signed)
Speech Language Pathology Treatment: Dysphagia  Patient Details Name: Allison Caldwell MRN: [id number] DOB: 10/10/1946 Today's Date: 03/29/2023 Time: 4098-1191 SLP Time Calculation (min) (ACUTE ONLY): 8 min  Assessment / Plan / Recommendation Clinical Impression  Diagnostic treatment complete at the request of RN who notes that no seizure activity currently. Patient alert but lethargic, with dysarthric, mumbled speech. She requested water but required moderate-max SLP tactile and visual cueing for awareness of straw to lips and to initiate drawing liquids up via straw. Once initiated, patient able to clear liquids orally however presents with immediate cough post swallow indicative of decreased airway protection. Suspect this is related to degree of fatigue/lethargy as patient with only mild oral dysphagia prior to seizure activity. Prognosis for ability to resume pos good with improved alertness. Will continue to f/u.    HPI HPI: Allison Caldwell is a 77 y.o. female with PMH significant for DM2, HTN, morbid obesity who fell as she got out of her bed. She hit her head on the dresser. MRI Moderate-sized acute hemorrhagic right MCA infarct, small acute right ACA territory infarct, moderate chronic small vessel ischemic disease.      SLP Plan  Goals updated      Recommendations for follow up therapy are one component of a multi-disciplinary discharge planning process, led by the attending physician.  Recommendations may be updated based on patient status, additional functional criteria and insurance authorization.    Recommendations  Diet recommendations: NPO Medication Administration: Via alternative means                  Oral care QID           Goals updated    Ferdinand Lango MA, CCC-SLP  Allison Caldwell  03/29/2023, 10:42 AM

## 2023-03-30 ENCOUNTER — Inpatient Hospital Stay (HOSPITAL_COMMUNITY): Payer: Medicare HMO

## 2023-03-30 DIAGNOSIS — I480 Paroxysmal atrial fibrillation: Secondary | ICD-10-CM | POA: Diagnosis not present

## 2023-03-30 DIAGNOSIS — R569 Unspecified convulsions: Secondary | ICD-10-CM | POA: Diagnosis not present

## 2023-03-30 DIAGNOSIS — R451 Restlessness and agitation: Secondary | ICD-10-CM | POA: Diagnosis not present

## 2023-03-30 DIAGNOSIS — I619 Nontraumatic intracerebral hemorrhage, unspecified: Secondary | ICD-10-CM | POA: Diagnosis not present

## 2023-03-30 LAB — CBC
HCT: 34.2 % — ABNORMAL LOW (ref 36.0–46.0)
Hemoglobin: 11.3 g/dL — ABNORMAL LOW (ref 12.0–15.0)
MCH: 30.8 pg (ref 26.0–34.0)
MCHC: 33 g/dL (ref 30.0–36.0)
MCV: 93.2 fL (ref 80.0–100.0)
Platelets: 194 10*3/uL (ref 150–400)
RBC: 3.67 MIL/uL — ABNORMAL LOW (ref 3.87–5.11)
RDW: 15.8 % — ABNORMAL HIGH (ref 11.5–15.5)
WBC: 4.4 10*3/uL (ref 4.0–10.5)
nRBC: 0 % (ref 0.0–0.2)

## 2023-03-30 LAB — BASIC METABOLIC PANEL
Anion gap: 9 (ref 5–15)
BUN: 17 mg/dL (ref 8–23)
CO2: 28 mmol/L (ref 22–32)
Calcium: 8.3 mg/dL — ABNORMAL LOW (ref 8.9–10.3)
Chloride: 97 mmol/L — ABNORMAL LOW (ref 98–111)
Creatinine, Ser: 0.57 mg/dL (ref 0.44–1.00)
GFR, Estimated: >60 mL/min (ref >60)
Glucose, Bld: 183 mg/dL — ABNORMAL HIGH (ref 70–99)
Potassium: 3.7 mmol/L (ref 3.5–5.1)
Sodium: 134 mmol/L — ABNORMAL LOW (ref 135–145)

## 2023-03-30 LAB — GLUCOSE, CAPILLARY
Glucose-Capillary: 152 mg/dL — ABNORMAL HIGH (ref 70–99)
Glucose-Capillary: 176 mg/dL — ABNORMAL HIGH (ref 70–99)
Glucose-Capillary: 191 mg/dL — ABNORMAL HIGH (ref 70–99)
Glucose-Capillary: 203 mg/dL — ABNORMAL HIGH (ref 70–99)
Glucose-Capillary: 221 mg/dL — ABNORMAL HIGH (ref 70–99)
Glucose-Capillary: 246 mg/dL — ABNORMAL HIGH (ref 70–99)

## 2023-03-30 LAB — VALPROIC ACID LEVEL: Valproic Acid Lvl: 43 ug/mL — ABNORMAL LOW (ref 50.0–100.0)

## 2023-03-30 MED ORDER — CLEVIDIPINE BUTYRATE 0.5 MG/ML IV EMUL
0.0000 mg/h | INTRAVENOUS | Status: DC
  Administered 2023-03-30 – 2023-04-01 (×2): 2 mg/h via INTRAVENOUS
  Filled 2023-03-30 (×2): qty 100

## 2023-03-30 MED ORDER — ORAL CARE MOUTH RINSE
15.0000 mL | OROMUCOSAL | Status: DC
  Administered 2023-03-30 – 2023-04-10 (×44): 15 mL via OROMUCOSAL

## 2023-03-30 MED ORDER — HALOPERIDOL LACTATE 5 MG/ML IJ SOLN
5.0000 mg | Freq: Once | INTRAMUSCULAR | Status: AC
  Administered 2023-03-30: 5 mg via INTRAVENOUS
  Filled 2023-03-30: qty 1

## 2023-03-30 MED ORDER — MIDAZOLAM HCL 2 MG/2ML IJ SOLN
2.0000 mg | Freq: Once | INTRAMUSCULAR | Status: DC
  Filled 2023-03-30 (×2): qty 2

## 2023-03-30 MED ORDER — ORAL CARE MOUTH RINSE: 15.0000 mL | OROMUCOSAL | Status: DC | PRN

## 2023-03-30 MED ORDER — LABETALOL HCL 5 MG/ML IV SOLN
10.0000 mg | Freq: Once | INTRAVENOUS | Status: AC
  Administered 2023-03-30: 10 mg via INTRAVENOUS
  Filled 2023-03-30: qty 4

## 2023-03-30 MED ORDER — QUETIAPINE FUMARATE 50 MG PO TABS
50.0000 mg | ORAL_TABLET | Freq: Two times a day (BID) | ORAL | Status: DC
  Administered 2023-03-30 – 2023-03-31 (×4): 50 mg
  Filled 2023-03-30 (×5): qty 1

## 2023-03-30 MED ORDER — DEXMEDETOMIDINE HCL IN NACL 400 MCG/100ML IV SOLN
0.0000 ug/kg/h | INTRAVENOUS | Status: DC
  Administered 2023-03-30 – 2023-03-31 (×2): 0.4 ug/kg/h via INTRAVENOUS
  Filled 2023-03-30 (×2): qty 100

## 2023-03-30 MED ORDER — VALPROATE SODIUM 100 MG/ML IV SOLN
500.0000 mg | Freq: Three times a day (TID) | INTRAVENOUS | Status: DC
  Administered 2023-03-30 – 2023-04-02 (×9): 500 mg via INTRAVENOUS
  Filled 2023-03-30 (×4): qty 5
  Filled 2023-03-30: qty 500
  Filled 2023-03-30 (×3): qty 5
  Filled 2023-03-30: qty 500
  Filled 2023-03-30: qty 5
  Filled 2023-03-30: qty 500
  Filled 2023-03-30: qty 5

## 2023-03-30 MED ORDER — QUETIAPINE FUMARATE 50 MG PO TABS: 50.0000 mg | ORAL_TABLET | Freq: Two times a day (BID) | ORAL | Status: DC

## 2023-03-30 MED ORDER — POTASSIUM CHLORIDE 20 MEQ PO PACK
40.0000 meq | PACK | Freq: Once | ORAL | Status: AC
  Administered 2023-03-30: 40 meq
  Filled 2023-03-30: qty 2

## 2023-03-30 NOTE — Progress Notes (Signed)
SLP Cancellation Note  Patient Details Name: Allison Caldwell MRN: [id number] DOB: Apr 26, 1946   Cancelled treatment:       Reason Eval/Treat Not Completed: Medical issues which prohibited therapy (Mentation will not allow for pos at this time per neuro MD. Will f/u next date.)  Ferdinand Lango MA, CCC-SLP  [name] [name] 03/30/2023, 11:05 AM

## 2023-03-30 NOTE — Progress Notes (Signed)
STROKE TEAM PROGRESS NOTE   INTERVAL HISTORY RN at the bedside. Pt overnight agitated, received ativan, haldol, restrain, seroquel and versed. Her BP was high and given labetalol. This morning, pt still agitated and restless. Not answer questions well, but denies HA, asking for water. However, left side seems to be weaker than yesterday but she did not sleep well at night. LTM showed no more seizure, will d/c LTM. Her depakote level low at 43, will increase depakote to Q8h  Vitals:   03/30/23 0800 03/30/23 0839 03/30/23 0932 03/30/23 1101  BP: (!) 153/88 (!) 165/103    Pulse: 78  (!) 105   Resp: 11     Temp:    98.4 F (36.9 C)  TempSrc:    Oral  SpO2: 94%     Weight:      Height:       CBC:  Recent Labs  Lab 03/25/23 0550 03/26/23 0019 03/29/23 0644 03/30/23 0035  WBC 7.0   < > 6.1 4.4  NEUTROABS 4.6  --   --   --   HGB 13.1   < > 11.7* 11.3*  HCT 40.0   < > 35.4* 34.2*  MCV 92.8   < > 94.4 93.2  PLT 161   < > 172 194   < > = values in this interval not displayed.   Basic Metabolic Panel:  Recent Labs  Lab 03/29/23 0644 03/29/23 1803 03/30/23 0035  NA 132*  --  134*  K 4.0  --  3.7  CL 96*  --  97*  CO2 26  --  28  GLUCOSE 183*  --  183*  BUN 21  --  17  CREATININE 0.73  --  0.57  CALCIUM 8.0*  --  8.3*  MG 2.1 1.9  --   PHOS 4.0 2.5  --    Lipid Panel:  Recent Labs  Lab 03/26/23 0019  CHOL 164  TRIG 55  HDL 99  CHOLHDL 1.7  VLDL 11  LDLCALC 54   HgbA1c:  Recent Labs  Lab 03/25/23 0847  HGBA1C 8.8*   Urine Drug Screen:  Recent Labs  Lab 03/25/23 0812  LABOPIA NONE DETECTED  COCAINSCRNUR NONE DETECTED  LABBENZ NONE DETECTED  AMPHETMU NONE DETECTED  THCU NONE DETECTED  LABBARB NONE DETECTED    Alcohol Level  Recent Labs  Lab 03/25/23 1201  ETH <10    IMAGING past 24 hours No results found.  PHYSICAL EXAM  Temp:  [97.8 F (36.6 C)-98.7 F (37.1 C)] 98.4 F (36.9 C) (05/30 1101) Pulse Rate:  [62-105] 105 (05/30 0932) Resp:   [6-29] 11 (05/30 0800) BP: (115-209)/(57-129) 165/103 (05/30 0839) SpO2:  [90 %-100 %] 94 % (05/30 0800) Weight:  [126.4 kg] 126.4 kg (05/30 0410)  General - obese, well developed, lethargic.  Ophthalmologic - fundi not visualized due to noncooperation.  Cardiovascular - irregularly irregular heart rate and rhythm.  Neuro - lethargic but eyes open intermittently, agitated and restless, moderate dysarthria, not quite follow commands, able to state no HA, and asking for water. Slight more left gaze preference, left pupil 3mm, right pupil enlarged, irregular sharp, consistent with previous surgery. Left facial droop. Tongue midline. RUE spontaneous movement against gravity. LUE 0/5 proximal. RLE 3/5 proximal and 4/5 distal. LLE slight withdraw to pain. Sensation and coordination not quite cooperative, gait not tested. No seizure activity seen.    ASSESSMENT/PLAN [name] is a 77 y.o. female with history of DM2, HTN, morbid  obesity  presenting with L sided weakness and found to have hemorrhagic transformation of R parietal stroke. Not on [facility], takes aspirin at home .   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 CTA head & neck No emergent LVO. Large main pulmonary artery as seen with pulmonary hypertension MRI: moderate right MCA and small R ACA infarcts CT repeat 5/28 patchy hemorrhage in the right posterior frontal and parietal lobes, which is likely unchanged in size, although some areas demonstrate slightly more hyperdense hemorrhage than on the prior exam. CT repeat 5/30 pending  2D Echo: EF 50 to 55%  LDL 54 HgbA1c 9.8 UDS neg VTE prophylaxis - heparin subq  aspirin 81 mg daily prior to admission, now on No antithrombotic due to ICH. Patient will need DOAC for Afib once ICH resolves.     Therapy recommendations:  pending Disposition:  pending  Focal seizure from right brain 5/27 - 3 episodes of left arm rhythmic twitching then left eyelid twitching spreading to right eyelid. During the episodes, pt was able to talk and orientated. The episode lasted about 2-4 min. S/p ativan 2mg  x 2 keppra 500mg  bid -> 1500 bid depakote 500 bid->Q8h vimpat 200 bid LTM EEG 5/27-28 showed 23 seizure arising from  right centro-parietal region, lasting 1 to 5 minutes.  LTM EEG 7/28-29 showed 6 seizure arising from  right centro-parietal region, lasting 1 to 2 minutes.  LTM EEG 7/29-30 no seizure seen LTM d/c 5/30 Depokate level 60->43 Once stable, will transition IV AEDs to po   PAF with RVR EKG shows Atrial Fibrillation Put on metoprolol 50 Consider DOAC once ICH resolves.  Hypertension Home meds: Benicar 40 mg home med BP high overnight Losartan 50mg  started, increased to 100mg  Lopressor 50mg  BID Back on cleviprex  SBP goal < 160 Long term BP goal normotensive  Hyperlipidemia Home meds: Pravastatin 20 mg LDL 54, goal < 70 Continue pravastatin 20 No high intensity statin due to LDL at goal Continue statin on discharge.  Diabetes type II Uncontrolled Home meds: Metformin 500 mg, insulin HgbA1c 9.8, goal < 7.0 CBGs SSI Close PCP follow-up for better DM control  Agitation and restless Now on seroquel 50mg  bid On precedex Close monitoring  Dysphagia  NPO now due to mental status Placed cortrak On TF @ 50cc SSI Q4h  Other Stroke Risk Factors Advanced Age >/= 43  Former Cigarette smoker Obesity, Body mass index is 47.83 kg/m., BMI >/= 30 associated with increased stroke risk, recommend weight loss, diet and exercise as appropriate  Family hx stroke (brother) ? Obstructive sleep apnea, not on CPAP at home  Other Active Problems Mild hyponatremia, Na 133->131->132->134 Hypokalemia, K 3.3->4.5->4.0->3.7 Hypomagnesemia - Mg 1.2->2.1->1.9   Hospital day #  5  This patient is critically ill due to stroke with hemorrhagic conversion, hypertensive emergency, PAF, seizure and at significant risk of neurological worsening, death form recurrent stroke, hemorrhagic conversion, status epilepticus, heart failure. This patient's care requires constant monitoring of vital signs, hemodynamics, respiratory and cardiac monitoring, review of multiple databases, neurological assessment, discussion with family, other specialists and medical decision making of high complexity. I spent 35 minutes of neurocritical care time in the care of this patient.  Marvel Plan, MD PhD Stroke Neurology 03/30/2023 11:03 AM      To contact Stroke Continuity provider, please refer to  [email or web address]. After hours, contact General Neurology

## 2023-03-30 NOTE — Care Plan (Signed)
EEG tech fixed A1, A2, and C3

## 2023-03-30 NOTE — Progress Notes (Signed)
OT Cancellation Note  Patient Details Name: Allison Caldwell MRN: [id number] DOB: [birth year]   Cancelled Treatment:    Reason Eval/Treat Not Completed: Medical issues which prohibited therapy. Prolonged QTC, awaiting stat EKG.  Tyler Deis, OTR/L [facility] Acute Rehabilitation Office: [phone]  [name] 03/30/2023, 3:14 PM

## 2023-03-30 NOTE — Procedures (Addendum)
Patient Name: Allison Caldwell  MRN: [id number]  Epilepsy Attending: Charlsie Quest  Referring Physician/Provider: Marvel Plan, MD  Duration: 03/29/2023 1218 to 03/30/2023 1218   Patient history: 77yo F with Right MCA and ACA infarcts now with left arm and eye twitching episodes. EEG to evaluate for seizure   Level of alertness: Awake, asleep   AEDs during EEG study: LEV, LCM, VPA   Technical aspects: This EEG study was done with scalp electrodes positioned according to the 10-20 International system of electrode placement. Electrical activity was reviewed with band pass filter of 1-70Hz , sensitivity of 7 uV/mm, display speed of 67mm/sec with a 60Hz  notched filter applied as appropriate. EEG data were recorded continuously and digitally stored.  Video monitoring was available and reviewed as appropriate.   Description: The posterior dominant rhythm consists of 9 Hz activity of moderate voltage (25-35 uV) seen predominantly in posterior head regions, asymmetric ( right<left) and reactive to eye opening and eye closing. Sleep was characterized by vertex waves, sleep spindles (12 to 14 Hz), maximal frontocentral region.  EEG showed continuous 3 to 6 Hz theta-delta slowing in right hemisphere. Intermittent generalized 3-6hz  theta-delta slowing was also noted.  Of note, parts of study were difficult to interpret due to significant electrode artifact.    ABNORMALITY - Continuous slow, right hemisphere.  - Intermittent slow, generalized   IMPRESSION: This study was suggestive of cortical dysfunction arising from right hemisphere likely secondary to underlying structural abnormality. Additionally there was mild diffuse encephalopathy. No seizures were noted.   EEG appears to be improving compared to previous day.    [name]

## 2023-03-30 NOTE — Progress Notes (Signed)
OVERNIGHT ON CALL NOTE   Multiple calls overnight regarding patient being agitated and difficult to redirect.  Added Seroquel at night via tube.  Worked for a little while but then patient again became very agitated  Given a dose of Ativan which did not really seem to help much.  Ordered 1 dose of Haldol IV 5 mg x 1.  Soft restraints for right wrist ordered-she is pulling on equipment which may harm her.  Stroke team will continue to follow    -- Milon Dikes, MD Neurologist [facility] Pager: [phone]  No charge note

## 2023-03-30 NOTE — Progress Notes (Signed)
PT Cancellation Note  Patient Details Name: Allison Caldwell MRN: [id number] DOB: January 05, 1946   Cancelled Treatment:    Reason Eval/Treat Not Completed: Medical issues which prohibited therapy - pt with prolonged QTC, await stat EKG.  Marye Round, PT DPT Acute Rehabilitation Services Secure Chat Preferred  Office [phone]    Truddie Coco 03/30/2023, 2:47 PM

## 2023-03-30 NOTE — Progress Notes (Signed)
LTM EEG discontinued - no skin breakdown at unhook.   

## 2023-03-30 NOTE — Plan of Care (Signed)
OVERNIGHT ON CALL NOTE  Called by RN-systolic blood pressure in the 200s Also still agitated-in spite of Seroquel, Haldol and Ativan. Giving 1 dose of Versed 2 mg IV now. Also ordering labetalol 10 mg IV x 1.  Already received hydralazine If the blood pressure does not get to goal with the labetalol dose, start Cleviprex drip.  -- Milon Dikes, MD Neurologist [facility] Pager: [phone]   No charge note

## 2023-03-31 DIAGNOSIS — I619 Nontraumatic intracerebral hemorrhage, unspecified: Secondary | ICD-10-CM | POA: Diagnosis not present

## 2023-03-31 DIAGNOSIS — R569 Unspecified convulsions: Secondary | ICD-10-CM | POA: Diagnosis not present

## 2023-03-31 LAB — GLUCOSE, CAPILLARY
Glucose-Capillary: 215 mg/dL — ABNORMAL HIGH (ref 70–99)
Glucose-Capillary: 192 mg/dL — ABNORMAL HIGH (ref 70–99)
Glucose-Capillary: 181 mg/dL — ABNORMAL HIGH (ref 70–99)
Glucose-Capillary: 175 mg/dL — ABNORMAL HIGH (ref 70–99)
Glucose-Capillary: 156 mg/dL — ABNORMAL HIGH (ref 70–99)
Glucose-Capillary: 167 mg/dL — ABNORMAL HIGH (ref 70–99)
Glucose-Capillary: 183 mg/dL — ABNORMAL HIGH (ref 70–99)

## 2023-03-31 LAB — VALPROIC ACID LEVEL: Valproic Acid Lvl: 59 ug/mL (ref 50.0–100.0)

## 2023-03-31 MED ORDER — LACOSAMIDE 200 MG PO TABS
200.0000 mg | ORAL_TABLET | Freq: Two times a day (BID) | ORAL | Status: DC
  Administered 2023-03-31 – 2023-04-10 (×20): 200 mg
  Filled 2023-03-31 (×20): qty 1

## 2023-03-31 MED ORDER — LEVETIRACETAM 750 MG PO TABS
1500.0000 mg | ORAL_TABLET | Freq: Two times a day (BID) | ORAL | Status: DC
  Administered 2023-03-31 – 2023-04-10 (×20): 1500 mg
  Filled 2023-03-31: qty 2
  Filled 2023-03-31: qty 3
  Filled 2023-03-31: qty 2
  Filled 2023-03-31 (×2): qty 3
  Filled 2023-03-31 (×3): qty 2
  Filled 2023-03-31: qty 3
  Filled 2023-03-31 (×11): qty 2

## 2023-03-31 MED ORDER — WHITE PETROLATUM EX OINT
TOPICAL_OINTMENT | CUTANEOUS | Status: DC | PRN
  Filled 2023-03-31: qty 28.35

## 2023-03-31 MED ORDER — INSULIN ASPART 100 UNIT/ML IJ SOLN
0.0000 [IU] | INTRAMUSCULAR | Status: DC
  Administered 2023-03-31 (×5): 4 [IU] via SUBCUTANEOUS
  Administered 2023-04-01: 7 [IU] via SUBCUTANEOUS
  Administered 2023-04-01: 3 [IU] via SUBCUTANEOUS
  Administered 2023-04-01: 7 [IU] via SUBCUTANEOUS
  Administered 2023-04-01: 4 [IU] via SUBCUTANEOUS
  Administered 2023-04-02 (×2): 3 [IU] via SUBCUTANEOUS
  Administered 2023-04-02: 4 [IU] via SUBCUTANEOUS
  Administered 2023-04-02: 3 [IU] via SUBCUTANEOUS
  Administered 2023-04-03: 7 [IU] via SUBCUTANEOUS
  Administered 2023-04-03 (×2): 3 [IU] via SUBCUTANEOUS
  Administered 2023-04-03: 4 [IU] via SUBCUTANEOUS
  Administered 2023-04-03: 7 [IU] via SUBCUTANEOUS
  Administered 2023-04-04 (×2): 3 [IU] via SUBCUTANEOUS
  Administered 2023-04-04: 4 [IU] via SUBCUTANEOUS
  Administered 2023-04-04: 3 [IU] via SUBCUTANEOUS
  Administered 2023-04-04 – 2023-04-05 (×2): 7 [IU] via SUBCUTANEOUS
  Administered 2023-04-05: 4 [IU] via SUBCUTANEOUS
  Administered 2023-04-05 (×2): 3 [IU] via SUBCUTANEOUS
  Administered 2023-04-06: 4 [IU] via SUBCUTANEOUS
  Administered 2023-04-06: 3 [IU] via SUBCUTANEOUS
  Administered 2023-04-06: 7 [IU] via SUBCUTANEOUS
  Administered 2023-04-07: 11 [IU] via SUBCUTANEOUS
  Administered 2023-04-07 (×3): 4 [IU] via SUBCUTANEOUS
  Administered 2023-04-07: 3 [IU] via SUBCUTANEOUS
  Administered 2023-04-08 (×3): 4 [IU] via SUBCUTANEOUS
  Administered 2023-04-08: 7 [IU] via SUBCUTANEOUS

## 2023-03-31 MED ORDER — HALOPERIDOL LACTATE 5 MG/ML IJ SOLN
5.0000 mg | Freq: Four times a day (QID) | INTRAMUSCULAR | Status: DC | PRN
  Administered 2023-03-31 – 2023-04-03 (×7): 5 mg via INTRAVENOUS
  Filled 2023-03-31 (×7): qty 1

## 2023-03-31 NOTE — Progress Notes (Signed)
Physical Therapy Treatment Patient Details Name: Allison Caldwell MRN: [id number] DOB: November 12, 1945 Today's Date: 03/31/2023   History of Present Illness [name] is a 77 y.o. female who presented after a fall out of bed, now has L sided weakness. 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 Witnessed seizures during admission. Undergoing LTM EEG. PMHx:  DM2, HTN, morbid obesity    PT Comments    Pt admitted with above diagnosis. Pt more lethargic today and keeping eyes closed most of session.  Following commands inconsistently and restless.  Pt needed mod to max +2 assist for all aspects of mobility.   Pt currently with functional limitations due to balance and endurance deficits.  Pt will benefit from acute skilled PT to increase their independence and safety with mobility to allow discharge.      Recommendations for follow up therapy are one component of a multi-disciplinary discharge planning process, led by the attending physician.  Recommendations may be updated based on patient status, additional functional criteria and insurance authorization.  Follow Up Recommendations       Assistance Recommended at Discharge Frequent or constant Supervision/Assistance  Patient can return home with the following Two people to help with walking and/or transfers;Two people to help with bathing/dressing/bathroom;Assistance with cooking/housework;Assist for transportation;Help with stairs or ramp for entrance   Equipment Recommendations  Other (comment) (TBA)    Recommendations for Other Services Rehab consult     Precautions / Restrictions Precautions Precautions: Fall;Other (comment) Precaution Comments: L inattention Restrictions Weight Bearing Restrictions: No     Mobility  Bed Mobility Overal bed mobility: Needs Assistance Bed Mobility: Rolling, Supine to Sit, Sit to Supine Rolling: Mod assist Sidelying to sit: Max  assist (+3) Supine to sit: Mod assist, +2 for physical assistance, +2 for safety/equipment, HOB elevated Sit to supine: Max assist, +2 for physical assistance, +2 for safety/equipment   General bed mobility comments: on entry, pt requesting to sit up- able to assist with RLE to EOB and lift trunk with Min A. assist for LLE and scooting to EOB. Once up, pt then requesting to lay back down but able to participate in tasks prior to this. Pt required Max A x 2 for safety in laying back down. Pt able to assist in rolling for pad mgmt with Mod A and demo scooting up with use of bedrails and RLE well    Transfers Overall transfer level: Needs assistance Equipment used: 2 person hand held assist, Rolling walker (2 wheels), Ambulation equipment used Transfers: Sit to/from Stand, Bed to chair/wheelchair/BSC Sit to Stand: Total assist, +2 physical assistance, +2 safety/equipment (+3)          Lateral/Scoot Transfers: Total assist, +2 physical assistance, +2 safety/equipment (+3) General transfer comment: deferred due to impaired attention and pt request to lay back down    Ambulation/Gait                   Stairs             Wheelchair Mobility    Modified Rankin (Stroke Patients Only)       Balance Overall balance assessment: Needs assistance Sitting-balance support: Feet supported Sitting balance-Leahy Scale: Poor Sitting balance - Comments: brief min guard to Min A sitting EOB w/ L lateral drift. brief instances of Mod A w/ posterior bias w/ fatigue Postural control: Posterior lean, Left lateral lean  Cognition Arousal/Alertness: Awake/alert Behavior During Therapy: Flat affect, Restless Overall Cognitive Status: Impaired/Different from baseline Area of Impairment: Orientation, Attention, Memory, Following commands, Safety/judgement, Awareness, Problem solving                 Orientation Level: Disoriented to,  Situation, Time, Place Current Attention Level: Focused Memory: Decreased short-term memory, Decreased recall of precautions Following Commands: Follows one step commands inconsistently Safety/Judgement: Decreased awareness of safety, Decreased awareness of deficits Awareness: Intellectual Problem Solving: Slow processing, Decreased initiation, Difficulty sequencing, Requires verbal cues, Requires tactile cues General Comments: very restless, poor attention to tasks impacting command following. unable to answer orientation questions but does respond to questions regarding pain, tv shows she likes to watch. decreased insight into deficits        Exercises General Exercises - Lower Extremity Long Arc Quad: AROM, Both, 5 reps, Seated Heel Slides: AAROM, Left, 5 reps, Supine    General Comments General comments (skin integrity, edema, etc.): VSS      Pertinent Vitals/Pain Pain Assessment Pain Assessment: Faces Faces Pain Scale: Hurts a little bit Breathing: normal Negative Vocalization: occasional moan/groan, low speech, negative/disapproving quality Facial Expression: smiling or inexpressive Body Language: tense, distressed pacing, fidgeting Consolability: no need to console PAINAD Score: 2 Pain Location: peri region when urinating Pain Descriptors / Indicators: Burning Pain Intervention(s): Monitored during session, Limited activity within patient's tolerance, Repositioned    Home Living                          Prior Function            PT Goals (current goals can now be found in the care plan section) Acute Rehab PT Goals Patient Stated Goal: not stated Progress towards PT goals: Progressing toward goals    Frequency    Min 4X/week      PT Plan Current plan remains appropriate    Co-evaluation PT/OT/SLP Co-Evaluation/Treatment: Yes Reason for Co-Treatment: For patient/therapist safety;To address functional/ADL transfers;Necessary to address  cognition/behavior during functional activity PT goals addressed during session: Mobility/safety with mobility OT goals addressed during session: ADL's and self-care;Strengthening/ROM      AM-PAC PT "6 Clicks" Mobility   Outcome Measure  Help needed turning from your back to your side while in a flat bed without using bedrails?: Total Help needed moving from lying on your back to sitting on the side of a flat bed without using bedrails?: Total Help needed moving to and from a bed to a chair (including a wheelchair)?: Total Help needed standing up from a chair using your arms (e.g., wheelchair or bedside chair)?: Total Help needed to walk in hospital room?: Total Help needed climbing 3-5 steps with a railing? : Total 6 Click Score: 6    End of Session Equipment Utilized During Treatment: Gait belt Activity Tolerance: Patient limited by fatigue Patient left: in bed;with call bell/phone within reach;with bed alarm set Nurse Communication: Mobility status;Need for lift equipment PT Visit Diagnosis: Muscle weakness (generalized) (M62.81);Hemiplegia and hemiparesis;Other abnormalities of gait and mobility (R26.89) Hemiplegia - Right/Left: Left Hemiplegia - dominant/non-dominant: Non-dominant Hemiplegia - caused by: Cerebral infarction     Time: 6045-4098 PT Time Calculation (min) (ACUTE ONLY): 24 min  Charges:  $Therapeutic Activity: 8-22 mins                     [facility] M,PT Acute Rehab Services [phone]    Bevelyn Buckles 03/31/2023, 3:24 PM

## 2023-03-31 NOTE — Progress Notes (Signed)
Speech Language Pathology Treatment: Dysphagia  Patient Details Name: Allison Caldwell MRN: [id number] DOB: October 13, 1946 Today's Date: 03/31/2023 Time: 4098-1191 SLP Time Calculation (min) (ACUTE ONLY): 16 min  Assessment / Plan / Recommendation Clinical Impression  Patient seen for po readiness/diagnostic treatment. Per RN, patient slightly more alert, calling out, moving in bed, however eyes remain mostly closed. Upon arrival to room, patient requesting something to drink. SLP administered diagnostic po trials. Patient requiring moderate-max verbal and tactile cueing for awareness of bolus including cold stimulation to bottom lip. Patient able to draw liquid via straw in 50% of attempts, orally transiting bolus with what appears to be appropriate timing and swallowing with no overt s/s of aspiration. In other 50% of attempts however, despite cueing, patient does not open oral cavity for acceptance of bolus despite cueing. Pureed solids consumed via very small bites (1/4 tsp) as patient does not fully open oral cavity despite cueing from SLP. At this time, cognitive continues to be patient's primary limiting factor to consuming pos. Suspect that as cognition improves, patient will have largely a normal oropharyngeal swallow or at the least a return to her baseline after initial swallow evaluation in which she presented with a mild oral dysphagia with some pocketing of bolus. Education complete with Charity fundraiser. Recommend NPO except sips/chips after oral care if patient alert and requesting water.    HPI HPI: Allison Caldwell is a 77 y.o. female with PMH significant for DM2, HTN, morbid obesity who fell as she got out of her bed. She hit her head on the dresser. MRI Moderate-sized acute hemorrhagic right MCA infarct, small acute right ACA territory infarct, moderate chronic small vessel ischemic disease.      SLP Plan  Goals updated      Recommendations for follow up therapy are one component of a  multi-disciplinary discharge planning process, led by the attending physician.  Recommendations may be updated based on patient status, additional functional criteria and insurance authorization.    Recommendations  Diet recommendations: NPO (except sips and chips after oral care) Medication Administration: Via alternative means Supervision: Full supervision/cueing for compensatory strategies                  Oral care QID;Oral care prior to ice chip/H20           Goals updated    Ferdinand Lango MA, CCC-SLP  [name] [name]  03/31/2023, 8:49 AM

## 2023-03-31 NOTE — Progress Notes (Signed)
Occupational Therapy Treatment Patient Details Name: Allison Caldwell MRN: [id number] DOB: [birth year] Today's Date: 03/31/2023   History of present illness [name] is a 77 y.o. female who presented after a fall out of bed, now has L sided weakness. 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 Witnessed seizures during admission. Undergoing LTM EEG. PMHx:  DM2, HTN, morbid obesity   OT comments  Pt with improvements in initiation of tasks, bed mobility and sitting balance. However, pt limited by restlessness and decreased attention to tasks today. Overall, pt requires Mod-Max A x 2 for bed mobility, able to sit EOB 7 min with grossly min guard to Min A for balance. Pt able to lift LUE slightly off of bed though difficult to fully assess L UE function w/ cognitive impairments today. Pt also reporting burning with urination - RN aware. Will continue to follow acutely with recommendation of postacute rehab stay at [facility].   Recommendations for follow up therapy are one component of a multi-disciplinary discharge planning process, led by the attending physician.  Recommendations may be updated based on patient status, additional functional criteria and insurance authorization.    Assistance Recommended at Discharge Frequent or constant Supervision/Assistance  Patient can return home with the following  Two people to help with walking and/or transfers;Two people to help with bathing/dressing/bathroom;Assistance with feeding;Assistance with cooking/housework;Direct supervision/assist for medications management;Direct supervision/assist for financial management;Assist for transportation;Help with stairs or ramp for entrance   Equipment Recommendations  Other (comment) (TBD pending progress)    Recommendations for Other Services Rehab consult    Precautions / Restrictions Precautions Precautions: Fall;Other (comment) Precaution  Comments: L inattention Restrictions Weight Bearing Restrictions: No       Mobility Bed Mobility Overal bed mobility: Needs Assistance Bed Mobility: Rolling, Supine to Sit, Sit to Supine Rolling: Mod assist   Supine to sit: Mod assist, +2 for physical assistance, +2 for safety/equipment, HOB elevated Sit to supine: Max assist, +2 for physical assistance, +2 for safety/equipment   General bed mobility comments: on entry, pt requesting to sit up- able to assist with RLE to EOB and lift trunk with Min A. assist for LLE and scooting to EOB. Once up, pt then requesting to lay back down but able to participate in tasks prior to this. Pt required Max A x 2 for safety in laying back down. Pt able to assist in rolling for pad mgmt with Mod A and demo scooting up with use of bedrails and RLE well    Transfers                   General transfer comment: deferred due to impaired attention and pt request to lay back down     Balance Overall balance assessment: Needs assistance Sitting-balance support: Feet supported Sitting balance-Leahy Scale: Poor Sitting balance - Comments: brief min guard to Min A sitting EOB w/ L lateral drift. brief instances of Mod A w/ posterior bias w/ fatigue Postural control: Posterior lean, Left lateral lean                                 ADL either performed or assessed with clinical judgement   ADL Overall ADL's : Needs assistance/impaired     Grooming: Moderate assistance;Sitting;Wash/dry face Grooming Details (indicate cue type and reason): able to wash eyes briefly but requires cues to continue task w/ assist  needed to complete             Lower Body Dressing: Total assistance;Bed level Lower Body Dressing Details (indicate cue type and reason): sock mgmt               General ADL Comments: impacted by restlessness and poor attention to tasks today though showing some initiation/insight into tasks    Extremity/Trunk  Assessment Upper Extremity Assessment Upper Extremity Assessment: LUE deficits/detail;Difficult to assess due to impaired cognition LUE Deficits / Details: no appreciable active movement of L hand/wrist; was able to minimally lift UE off of bed though difficult to fully assess due to impaired cognition LUE Coordination: decreased fine motor;decreased gross motor   Lower Extremity Assessment Lower Extremity Assessment: Defer to PT evaluation        Vision   Vision Assessment?: Vision impaired- to be further tested in functional context Additional Comments: L inattention; difficult to fully assess as pt kept eyes closed for majority of session   Perception     Praxis      Cognition Arousal/Alertness: Awake/alert Behavior During Therapy: Flat affect, Restless Overall Cognitive Status: Impaired/Different from baseline Area of Impairment: Orientation, Attention, Memory, Following commands, Safety/judgement, Awareness, Problem solving                 Orientation Level: Disoriented to, Situation, Time, Place Current Attention Level: Focused Memory: Decreased short-term memory, Decreased recall of precautions Following Commands: Follows one step commands inconsistently Safety/Judgement: Decreased awareness of safety, Decreased awareness of deficits Awareness: Intellectual Problem Solving: Slow processing, Decreased initiation, Difficulty sequencing, Requires verbal cues, Requires tactile cues General Comments: very restless, poor attention to tasks impacting command following. unable to answer orientation questions but does respond to questions regarding pain, tv shows she likes to watch. decreased insight into deficits        Exercises      Shoulder Instructions       General Comments      Pertinent Vitals/ Pain       Pain Assessment Pain Assessment: Faces Faces Pain Scale: Hurts a little bit Pain Location: peri region when urinating Pain Descriptors / Indicators:  Burning Pain Intervention(s): Other (comment) (notified RN)  Home Living                                          Prior Functioning/Environment              Frequency  Min 2X/week        Progress Toward Goals  OT Goals(current goals can now be found in the care plan section)  Progress towards OT goals: Progressing toward goals  Acute Rehab OT Goals Patient Stated Goal: sit up, have something to eat OT Goal Formulation: With patient Time For Goal Achievement: 04/10/23 Potential to Achieve Goals: Fair ADL Goals Pt Will Perform Grooming: with min guard assist;sitting Pt Will Perform Upper Body Dressing: with min assist;sitting Pt Will Perform Lower Body Dressing: with max assist;sit to/from stand Pt Will Transfer to Toilet: with max assist;stand pivot transfer;bedside commode Additional ADL Goal #1: Pt will attend to L environment to locate ADL items with min cues as a precursor to self care  Plan Discharge plan remains appropriate    Co-evaluation    PT/OT/SLP Co-Evaluation/Treatment: Yes Reason for Co-Treatment: For patient/therapist safety;To address functional/ADL transfers;Necessary to address cognition/behavior during functional activity   OT goals addressed  during session: ADL's and self-care;Strengthening/ROM      AM-PAC OT "6 Clicks" Daily Activity     Outcome Measure   Help from another person eating meals?: Total (NPO) Help from another person taking care of personal grooming?: A Lot Help from another person toileting, which includes using toliet, bedpan, or urinal?: Total Help from another person bathing (including washing, rinsing, drying)?: Total Help from another person to put on and taking off regular upper body clothing?: A Lot Help from another person to put on and taking off regular lower body clothing?: Total 6 Click Score: 8    End of Session    OT Visit Diagnosis: Unsteadiness on feet (R26.81);Other abnormalities of  gait and mobility (R26.89);Muscle weakness (generalized) (M62.81);History of falling (Z91.81);Hemiplegia and hemiparesis Hemiplegia - Right/Left: Left Hemiplegia - dominant/non-dominant: Non-Dominant Hemiplegia - caused by: Cerebral infarction   Activity Tolerance Patient tolerated treatment well;Other (comment) (limited by cognition)   Patient Left in bed;with call bell/phone within reach;with bed alarm set   Nurse Communication Mobility status;Other (comment) (burning sensation when peeing)        Time: 1308-6578 OT Time Calculation (min): 23 min  Charges: OT General Charges $OT Visit: 1 Visit OT Treatments $Therapeutic Activity: 8-22 mins  Bradd Canary, OTR/L Acute Rehab Services Office: [phone]   Lorre Munroe 03/31/2023, 9:22 AM

## 2023-03-31 NOTE — Procedures (Signed)
Patient Name: Allison Caldwell  MRN: [id number]  Epilepsy Attending: Charlsie Quest  Referring Physician/Provider: Marvel Plan, MD  Duration: 530/2024 1218 to 03/30/2023 1614   Patient history: 77yo F with Right MCA and ACA infarcts now with left arm and eye twitching episodes. EEG to evaluate for seizure   Level of alertness: Awake, asleep   AEDs during EEG study: LEV, LCM, VPA   Technical aspects: This EEG study was done with scalp electrodes positioned according to the 10-20 International system of electrode placement. Electrical activity was reviewed with band pass filter of 1-70Hz , sensitivity of 7 uV/mm, display speed of 53mm/sec with a 60Hz  notched filter applied as appropriate. EEG data were recorded continuously and digitally stored.  Video monitoring was available and reviewed as appropriate.   Description: The posterior dominant rhythm consists of 9 Hz activity of moderate voltage (25-35 uV) seen predominantly in posterior head regions, asymmetric ( right<left) and reactive to eye opening and eye closing. Sleep was characterized by vertex waves, sleep spindles (12 to 14 Hz), maximal frontocentral region.  EEG showed continuous 3 to 6 Hz theta-delta slowing in right hemisphere. Intermittent generalized 3-6hz  theta-delta slowing was also noted.   Of note, parts of study were difficult to interpret due to significant electrode artifact.    ABNORMALITY - Continuous slow, right hemisphere.  - Intermittent slow, generalized   IMPRESSION: This study was suggestive of cortical dysfunction arising from right hemisphere likely secondary to underlying structural abnormality. Additionally there was mild diffuse encephalopathy. No seizures were noted.    Niclas Markell Annabelle Harman

## 2023-03-31 NOTE — Progress Notes (Signed)
STROKE TEAM PROGRESS NOTE   INTERVAL HISTORY RN at the bedside. Pt overnight still agitated but this morning she was in sleep and hard to arouse, so precedex was off. Then hours later, RN call me again because she was agitated again.   Vitals:   03/31/23 1645 03/31/23 1700 03/31/23 1800 03/31/23 1907  BP:  137/69 131/61   Pulse: 83 82 83   Resp: 19 (!) 23 (!) 24   Temp:    98.2 F (36.8 C)  TempSrc:    Oral  SpO2: 95% 96% 97%   Weight:      Height:       CBC:  Recent Labs  Lab 03/25/23 0550 03/26/23 0019 03/29/23 0644 03/30/23 0035  WBC 7.0   < > 6.1 4.4  NEUTROABS 4.6  --   --   --   HGB 13.1   < > 11.7* 11.3*  HCT 40.0   < > 35.4* 34.2*  MCV 92.8   < > 94.4 93.2  PLT 161   < > 172 194   < > = values in this interval not displayed.   Basic Metabolic Panel:  Recent Labs  Lab 03/29/23 0644 03/29/23 1803 03/30/23 0035  NA 132*  --  134*  K 4.0  --  3.7  CL 96*  --  97*  CO2 26  --  28  GLUCOSE 183*  --  183*  BUN 21  --  17  CREATININE 0.73  --  0.57  CALCIUM 8.0*  --  8.3*  MG 2.1 1.9  --   PHOS 4.0 2.5  --    Lipid Panel:  Recent Labs  Lab 03/26/23 0019  CHOL 164  TRIG 55  HDL 99  CHOLHDL 1.7  VLDL 11  LDLCALC 54   HgbA1c:  Recent Labs  Lab 03/25/23 0847  HGBA1C 8.8*   Urine Drug Screen:  Recent Labs  Lab 03/25/23 0812  LABOPIA NONE DETECTED  COCAINSCRNUR NONE DETECTED  LABBENZ NONE DETECTED  AMPHETMU NONE DETECTED  THCU NONE DETECTED  LABBARB NONE DETECTED    Alcohol Level  Recent Labs  Lab 03/25/23 1201  ETH <10    IMAGING past 24 hours No results found.  PHYSICAL EXAM  Temp:  [97 F (36.1 C)-98.2 F (36.8 C)] 98.2 F (36.8 C) (05/31 1907) Pulse Rate:  [59-100] 83 (05/31 1800) Resp:  [12-33] 24 (05/31 1800) BP: (89-207)/(51-141) 131/61 (05/31 1800) SpO2:  [92 %-99 %] 97 % (05/31 1800) Weight:  [123.1 kg] 123.1 kg (05/31 0500)  General - obese, well developed, lethargic.  Ophthalmologic - fundi not visualized due to  noncooperation.  Cardiovascular - irregularly irregular heart rate and rhythm.  Neuro - lethargic but eyes open intermittently, agitated and restless, moderate dysarthria, not quite follow commands, able to state no HA, and asking for water. Slight more left gaze preference, left pupil 3mm, right pupil enlarged, irregular sharp, consistent with previous surgery. Left facial droop. Tongue midline. RUE spontaneous movement against gravity. LUE 0/5 proximal. RLE 3/5 proximal and 4/5 distal. LLE slight withdraw to pain. Sensation and coordination not quite cooperative, gait not tested. No seizure activity seen.    ASSESSMENT/PLAN Ms. Sheryn Leath is a 77 y.o. female with history of DM2, HTN, morbid obesity  presenting with L sided weakness and found to have hemorrhagic transformation of R parietal stroke. Not on [facility], takes aspirin at home .   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 CTA head & neck No emergent LVO. Large main pulmonary artery as seen with pulmonary hypertension MRI: moderate right MCA and small R ACA infarcts CT repeat 5/28 patchy hemorrhage in the right posterior frontal and parietal lobes, which is likely unchanged in size, although some areas demonstrate slightly more hyperdense hemorrhage than on the prior exam. CT repeat 5/30 unchanged hemorrhagic infarct in the posterior right temporal lobe and unchanged subacute infarct in the right superior frontal gyrus. No new intracranial hemorrhage.  2D Echo: EF 50 to 55%  LDL 54 HgbA1c 9.8 UDS neg VTE prophylaxis - heparin subq  aspirin 81 mg daily prior to admission, now on No antithrombotic due to ICH. Patient will need DOAC for Afib once ICH resolves.    Therapy recommendations:  pending Disposition:  pending  Focal seizure from  right brain 5/27 - 3 episodes of left arm rhythmic twitching then left eyelid twitching spreading to right eyelid. During the episodes, pt was able to talk and orientated. The episode lasted about 2-4 min. S/p ativan 2mg  x 2 keppra 500mg  bid -> 1500 bid-> po  depakote 500 bid->Q8h vimpat 200 bid -> po LTM EEG 5/27-28 showed 23 seizure arising from  right centro-parietal region, lasting 1 to 5 minutes.  LTM EEG 7/28-29 showed 6 seizure arising from  right centro-parietal region, lasting 1 to 2 minutes.  LTM EEG 7/29-30 no seizure seen LTM d/c 5/30 Depokate level 60->43->59->pending Once level stable, will transition depakote IV to po   PAF with RVR EKG shows Atrial Fibrillation Put on metoprolol 50 Consider DOAC once ICH resolves.  Hypertension Home meds: Benicar 40 mg home med BP high overnight Losartan 50mg  started, increased to 100mg  Lopressor 50mg  BID Back on cleviprex  SBP goal < 160 Long term BP goal normotensive  Hyperlipidemia Home meds: Pravastatin 20 mg LDL 54, goal < 70 Continue pravastatin 20 No high intensity statin due to LDL at goal Continue statin on discharge.  Diabetes type II Uncontrolled Home meds: Metformin 500 mg, insulin HgbA1c 9.8, goal < 7.0 CBGs SSI Close PCP follow-up for better DM control  Agitation and restless Now on seroquel 50mg  bid On precedex Close monitoring  Dysphagia  NPO now due to mental status Placed cortrak On TF @ 50cc SSI Q4h  Other Stroke Risk Factors Advanced Age >/= 86  Former Cigarette smoker Obesity, Body mass index is 46.58 kg/m., BMI >/= 30 associated with increased stroke risk, recommend weight loss, diet and exercise as appropriate  Family hx stroke (brother) ? Obstructive sleep apnea, not on CPAP at home  Other Active Problems Mild hyponatremia, Na 133->131->132->134 Hypokalemia, K 3.3->4.5->4.0->3.7 Hypomagnesemia - Mg 1.2->2.1->1.9   Hospital day # 6  This patient is critically ill due to stroke  with hemorrhagic conversion, hypertensive emergency, PAF, seizure and at significant risk of neurological worsening, death form recurrent stroke, hemorrhagic conversion, status epilepticus, heart failure. This patient's care requires constant monitoring of vital signs, hemodynamics, respiratory and cardiac monitoring, review of multiple databases, neurological assessment, discussion with family, other specialists and medical decision making of high complexity. I spent 35 minutes of neurocritical care time in the care of this patient.  Marvel Plan, MD PhD Stroke Neurology 03/31/2023 7:40 PM      To contact Stroke Continuity provider, please refer to [email or web address]. After hours, contact General Neurology

## 2023-03-31 NOTE — Progress Notes (Signed)
eLink Physician-Brief Progress Note Patient Name: Allison Caldwell DOB: 1946/10/31 MRN: [id number]   Date of Service  03/31/2023  HPI/Events of Note  Requesting renewal of right wrist restraints.  eICU Interventions  Patient examined on camera.  Moving right side spontaneously.  Encephalopathic.  Restraints renewed.     Intervention Category Evaluation Type: Other  Carilyn Goodpasture 03/31/2023, 4:37 AM

## 2023-04-01 ENCOUNTER — Inpatient Hospital Stay (HOSPITAL_COMMUNITY): Payer: Medicare HMO

## 2023-04-01 DIAGNOSIS — I619 Nontraumatic intracerebral hemorrhage, unspecified: Secondary | ICD-10-CM | POA: Diagnosis not present

## 2023-04-01 LAB — GLUCOSE, CAPILLARY
Glucose-Capillary: 212 mg/dL — ABNORMAL HIGH (ref 70–99)
Glucose-Capillary: 229 mg/dL — ABNORMAL HIGH (ref 70–99)
Glucose-Capillary: 165 mg/dL — ABNORMAL HIGH (ref 70–99)
Glucose-Capillary: 117 mg/dL — ABNORMAL HIGH (ref 70–99)
Glucose-Capillary: 103 mg/dL — ABNORMAL HIGH (ref 70–99)
Glucose-Capillary: 158 mg/dL — ABNORMAL HIGH (ref 70–99)

## 2023-04-01 LAB — CBC
HCT: 38 % (ref 36.0–46.0)
Hemoglobin: 12.6 g/dL (ref 12.0–15.0)
MCH: 30.9 pg (ref 26.0–34.0)
MCHC: 33.2 g/dL (ref 30.0–36.0)
MCV: 93.1 fL (ref 80.0–100.0)
Platelets: 211 10*3/uL (ref 150–400)
RBC: 4.08 MIL/uL (ref 3.87–5.11)
RDW: 16 % — ABNORMAL HIGH (ref 11.5–15.5)
WBC: 6.1 10*3/uL (ref 4.0–10.5)
nRBC: 0 % (ref 0.0–0.2)

## 2023-04-01 LAB — BASIC METABOLIC PANEL
Anion gap: 12 (ref 5–15)
BUN: 22 mg/dL (ref 8–23)
CO2: 29 mmol/L (ref 22–32)
Calcium: 9 mg/dL (ref 8.9–10.3)
Chloride: 95 mmol/L — ABNORMAL LOW (ref 98–111)
Creatinine, Ser: 0.82 mg/dL (ref 0.44–1.00)
GFR, Estimated: >60 mL/min (ref >60)
Glucose, Bld: 221 mg/dL — ABNORMAL HIGH (ref 70–99)
Potassium: 4 mmol/L (ref 3.5–5.1)
Sodium: 136 mmol/L (ref 135–145)

## 2023-04-01 LAB — VALPROIC ACID LEVEL: Valproic Acid Lvl: 58 ug/mL (ref 50.0–100.0)

## 2023-04-01 MED ORDER — QUETIAPINE FUMARATE 100 MG PO TABS
100.0000 mg | ORAL_TABLET | Freq: Two times a day (BID) | ORAL | Status: DC
  Administered 2023-04-01 – 2023-04-05 (×9): 100 mg
  Filled 2023-04-01 (×9): qty 1

## 2023-04-01 MED ORDER — POLYETHYLENE GLYCOL 3350 17 G PO PACK
17.0000 g | PACK | Freq: Two times a day (BID) | ORAL | Status: DC
  Administered 2023-04-01 – 2023-04-04 (×7): 17 g
  Filled 2023-04-01 (×7): qty 1

## 2023-04-01 MED ORDER — INSULIN ASPART 100 UNIT/ML IJ SOLN
4.0000 [IU] | INTRAMUSCULAR | Status: DC
  Administered 2023-04-01 – 2023-04-03 (×10): 4 [IU] via SUBCUTANEOUS

## 2023-04-01 MED ORDER — SODIUM CHLORIDE 0.9 % IV BOLUS
500.0000 mL | Freq: Once | INTRAVENOUS | Status: AC
  Administered 2023-04-01: 500 mL via INTRAVENOUS

## 2023-04-01 MED ORDER — LORAZEPAM 2 MG/ML IJ SOLN
1.0000 mg | Freq: Once | INTRAMUSCULAR | Status: AC
  Administered 2023-04-01: 1 mg via INTRAVENOUS
  Filled 2023-04-01: qty 1

## 2023-04-01 NOTE — Progress Notes (Addendum)
STROKE TEAM PROGRESS NOTE   INTERVAL HISTORY RN at the bedside.  Still agitated this am. Didn't sleep well. She is in pain "all over". In restraints, trying to get out of bed.   Does not drink EOTH at home. She does take tylenol for pain at home.   Precedex helps some, haldol makes her sleepy.   Received ativan 1mg  this am.   Will increase seroquel to 100mg  BID.  Vitals:   04/01/23 0530 04/01/23 0600 04/01/23 0630 04/01/23 0756  BP: (!) 172/84 (!) 106/51 (!) 143/62   Pulse: 76 64 63   Resp: 19 (!) 21 17   Temp:    97.9 F (36.6 C)  TempSrc:    Axillary  SpO2: 99% 98% 100%   Weight:      Height:       CBC:  Recent Labs  Lab 03/30/23 0035 04/01/23 0807  WBC 4.4 6.1  HGB 11.3* 12.6  HCT 34.2* 38.0  MCV 93.2 93.1  PLT 194 211    Basic Metabolic Panel:  Recent Labs  Lab 03/29/23 0644 03/29/23 1803 03/30/23 0035 04/01/23 0807  NA 132*  --  134* 136  K 4.0  --  3.7 4.0  CL 96*  --  97* 95*  CO2 26  --  28 29  GLUCOSE 183*  --  183* 221*  BUN 21  --  17 22  CREATININE 0.73  --  0.57 0.82  CALCIUM 8.0*  --  8.3* 9.0  MG 2.1 1.9  --   --   PHOS 4.0 2.5  --   --     Lipid Panel:  Recent Labs  Lab 03/26/23 0019  CHOL 164  TRIG 55  HDL 99  CHOLHDL 1.7  VLDL 11  LDLCALC 54    HgbA1c:  No results for input(s): "HGBA1C" in the last 168 hours.  Urine Drug Screen:  No results for input(s): "LABOPIA", "COCAINSCRNUR", "LABBENZ", "AMPHETMU", "THCU", "LABBARB" in the last 168 hours.   Alcohol Level  Recent Labs  Lab 03/25/23 1201  ETH <10     IMAGING past 24 hours No results found.  PHYSICAL EXAM  Temp:  [97.7 F (36.5 C)-98.4 F (36.9 C)] 97.9 F (36.6 C) (06/01 0756) Pulse Rate:  [55-95] 63 (06/01 0630) Resp:  [12-35] 17 (06/01 0630) BP: (73-222)/(40-141) 143/62 (06/01 0630) SpO2:  [92 %-100 %] 100 % (06/01 0630) Weight:  [126.6 kg] 126.6 kg (06/01 0500)  General - obese, well developed, lethargic.  Ophthalmologic - fundi not visualized  due to noncooperation.  Cardiovascular - irregularly irregular heart rate and rhythm.  Neuro - keeps eyes closed but interactive, agitated and restless, moderate dysarthria, following commands. Knew name, place and year. Opens eyes to command, will track examiner.  Slight more left gaze preference, left pupil 3mm and reactive, right pupil enlarged, irregular sharp, consistent with previous surgery. Slight Left facial droop. RUE will lift to command, and squeeze hand. LUE 1/5 proximal. RLE 3/5 proximal and 4/5 distal. LLE slight withdraw to pain. Will wiggle toes to command. Sensation and coordination not quite cooperative, gait not tested.     ASSESSMENT/PLAN [name] is a 77 y.o. female with history of DM2, HTN, morbid obesity  presenting with L sided weakness and found to have hemorrhagic transformation of R parietal stroke. Not on [facility], takes aspirin at home .   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 CTA head & neck No emergent LVO. Large main pulmonary artery as seen with pulmonary hypertension MRI: moderate right MCA and small R ACA infarcts CT repeat 5/28 patchy hemorrhage in the right posterior frontal and parietal lobes, which is likely unchanged in size, although some areas demonstrate slightly more hyperdense hemorrhage than on the prior exam. CT repeat 5/30 unchanged hemorrhagic infarct in the posterior right temporal lobe and unchanged subacute infarct in the right superior frontal gyrus. No new intracranial hemorrhage.  2D Echo: EF 50 to 55%  LDL 54 HgbA1c 9.8 UDS neg VTE prophylaxis - heparin subq  aspirin 81 mg daily prior to admission, now on No antithrombotic due to ICH. Patient will need DOAC for Afib once ICH resolves.    Therapy recommendations:  pending Disposition:   pending  Focal seizure from right brain 5/27 - 3 episodes of left arm rhythmic twitching then left eyelid twitching spreading to right eyelid. During the episodes, pt was able to talk and orientated. The episode lasted about 2-4 min. S/p ativan 2mg  x 2 keppra 500mg  bid -> 1500 bid-> po  depakote 500 bid->Q8h vimpat 200 bid -> po LTM EEG 5/27-28 showed 23 seizure arising from  right centro-parietal region, lasting 1 to 5 minutes.  LTM EEG 7/28-29 showed 6 seizure arising from  right centro-parietal region, lasting 1 to 2 minutes.  LTM EEG 7/29-30 no seizure seen LTM d/c 5/30 Depokate level 60->43->59->pending Once level stable, will transition depakote IV to po   PAF with RVR EKG shows Atrial Fibrillation Put on metoprolol 50 Consider DOAC once ICH resolves.  Hypertension Home meds: Benicar 40 mg home med BP high overnight Losartan 50mg  started, increased to 100mg  Lopressor 50mg  BID Back on cleviprex  SBP goal < 160 Long term BP goal normotensive  Hyperlipidemia Home meds: Pravastatin 20 mg LDL 54, goal < 70 Continue pravastatin 20 No high intensity statin due to LDL at goal Continue statin on discharge.  Diabetes type II Uncontrolled Home meds: Metformin 500 mg, insulin HgbA1c 9.8, goal < 7.0 CBGs SSI Close PCP follow-up for better DM control  Agitation and restless increase seroquel to 100mg  BID On precedex Close monitoring Try to avoid ativan for agitation. Goal is better pain control.  Dysphagia  NPO now due to mental status Placed cortrak On TF @ 50cc SSI Q4h  Other Stroke Risk Factors Advanced Age >/= 47  Former Cigarette smoker Obesity, Body mass index is 47.91 kg/m., BMI >/= 30 associated with increased stroke risk, recommend weight loss, diet and exercise as appropriate  Family hx stroke (brother) ? Obstructive sleep apnea, not on CPAP at home  Other Active Problems Mild hyponatremia, Na 133->131->132->134 Hypokalemia, K  3.3->4.5->4.0->3.7 Hypomagnesemia - Mg 1.2->2.1->1.9  Pain: will start tylenolol and if it does not work then will add fentanyl to help with agitation. Will try to avoid ativan for now.     Hospital day # 7  This patient is critically ill due to stroke with hemorrhagic conversion, hypertensive emergency, PAF, seizure and at significant risk of neurological worsening, death form recurrent stroke, hemorrhagic conversion, status epilepticus, heart failure. This patient's care requires constant monitoring of vital signs, hemodynamics, respiratory and cardiac monitoring, review of multiple databases, neurological assessment, discussion with family, other specialists and medical decision making of high complexity. I spent 40 minutes of neurocritical care time in the care of this patient.   04/01/2023 9:08 AM  To contact Stroke Continuity provider, please refer to [email or web address]. After hours, contact General Neurology

## 2023-04-01 NOTE — Plan of Care (Signed)
Overnight on-call note  Called by the overnight RN.  Patient with extreme agitation and restlessness-reports she is not able to get comfortable.  She is on Seroquel 100 twice daily, sertraline.  Precedex drip was held in anticipation of moving out to the ICU.  For now I recommend a one-time dose of Ativan 1 mg IV but if that does not work, unfortunately we will have to restart Precedex to ensure that her vitals do not become unstable given her recent hemorrhagic transformation of the stroke.  Plan was discussed with the RN.  -- Milon Dikes, MD Neurologist [facility] Pager: [phone]

## 2023-04-01 NOTE — Progress Notes (Signed)
SLP Cancellation Note  Patient Details Name: Allison Caldwell MRN: [id number] DOB: 03/16/1946   Cancelled treatment:        Spoke with RN, pt sleeping- had Ativan and seroquel this morning. RN states she has been having water with delayed coughing. RN reported daughter says she coughs frequently when she eats/drinks prior to this admission. CT today revealed no acute cardiopulmonary disease. SLP will continue attempts next week- for initiation of po's versus instrumental assessment.    [name] 04/01/2023, 12:48 PM

## 2023-04-02 ENCOUNTER — Encounter (HOSPITAL_COMMUNITY): Payer: Self-pay | Admitting: Neurology

## 2023-04-02 DIAGNOSIS — I619 Nontraumatic intracerebral hemorrhage, unspecified: Secondary | ICD-10-CM | POA: Diagnosis not present

## 2023-04-02 LAB — CBC
HCT: 40.3 % (ref 36.0–46.0)
Hemoglobin: 12.8 g/dL (ref 12.0–15.0)
MCH: 30.1 pg (ref 26.0–34.0)
MCHC: 31.8 g/dL (ref 30.0–36.0)
MCV: 94.8 fL (ref 80.0–100.0)
Platelets: 236 10*3/uL (ref 150–400)
RBC: 4.25 MIL/uL (ref 3.87–5.11)
RDW: 16.3 % — ABNORMAL HIGH (ref 11.5–15.5)
WBC: 8.1 10*3/uL (ref 4.0–10.5)
nRBC: 0 % (ref 0.0–0.2)

## 2023-04-02 LAB — VALPROIC ACID LEVEL: Valproic Acid Lvl: 57 ug/mL (ref 50.0–100.0)

## 2023-04-02 LAB — GLUCOSE, CAPILLARY
Glucose-Capillary: 123 mg/dL — ABNORMAL HIGH (ref 70–99)
Glucose-Capillary: 135 mg/dL — ABNORMAL HIGH (ref 70–99)
Glucose-Capillary: 143 mg/dL — ABNORMAL HIGH (ref 70–99)
Glucose-Capillary: 156 mg/dL — ABNORMAL HIGH (ref 70–99)
Glucose-Capillary: 201 mg/dL — ABNORMAL HIGH (ref 70–99)
Glucose-Capillary: 72 mg/dL (ref 70–99)

## 2023-04-02 LAB — BASIC METABOLIC PANEL
Anion gap: 11 (ref 5–15)
BUN: 25 mg/dL — ABNORMAL HIGH (ref 8–23)
CO2: 28 mmol/L (ref 22–32)
Calcium: 9 mg/dL (ref 8.9–10.3)
Chloride: 94 mmol/L — ABNORMAL LOW (ref 98–111)
Creatinine, Ser: 0.81 mg/dL (ref 0.44–1.00)
GFR, Estimated: >60 mL/min (ref >60)
Glucose, Bld: 175 mg/dL — ABNORMAL HIGH (ref 70–99)
Potassium: 4 mmol/L (ref 3.5–5.1)
Sodium: 133 mmol/L — ABNORMAL LOW (ref 135–145)

## 2023-04-02 MED ORDER — CHLORHEXIDINE GLUCONATE CLOTH 2 % EX PADS: 6.0000 | MEDICATED_PAD | CUTANEOUS | Status: DC

## 2023-04-02 MED ORDER — BUTALBITAL-APAP-CAFFEINE 50-325-40 MG PO TABS
1.0000 | ORAL_TABLET | Freq: Four times a day (QID) | ORAL | Status: DC | PRN
  Administered 2023-04-04 – 2023-04-10 (×2): 1
  Filled 2023-04-02 (×2): qty 1

## 2023-04-02 MED ORDER — VALPROIC ACID 250 MG/5ML PO SOLN
500.0000 mg | Freq: Three times a day (TID) | ORAL | Status: DC
  Administered 2023-04-02 – 2023-04-10 (×25): 500 mg via ORAL
  Filled 2023-04-02 (×24): qty 10

## 2023-04-02 MED ORDER — SENNOSIDES-DOCUSATE SODIUM 8.6-50 MG PO TABS
2.0000 | ORAL_TABLET | Freq: Two times a day (BID) | ORAL | Status: DC
  Administered 2023-04-02 – 2023-04-04 (×5): 2
  Filled 2023-04-02 (×6): qty 2

## 2023-04-02 MED ORDER — DIVALPROEX SODIUM ER 500 MG PO TB24: 500.0000 mg | ORAL_TABLET | Freq: Two times a day (BID) | ORAL | Status: DC

## 2023-04-02 MED ORDER — ONDANSETRON HCL 4 MG/2ML IJ SOLN
4.0000 mg | Freq: Three times a day (TID) | INTRAMUSCULAR | Status: DC | PRN
  Administered 2023-04-02: 4 mg via INTRAVENOUS
  Filled 2023-04-02: qty 2

## 2023-04-02 NOTE — Plan of Care (Signed)
  Problem: Education: Goal: Knowledge of disease or condition will improve Outcome: Progressing Goal: Knowledge of secondary prevention will improve (MUST DOCUMENT ALL) Outcome: Progressing Goal: Knowledge of patient specific risk factors will improve (Mark N/A or DELETE if not current risk factor) Outcome: Progressing   Problem: Intracerebral Hemorrhage Tissue Perfusion: Goal: Complications of Intracerebral Hemorrhage will be minimized Outcome: Progressing   Problem: Coping: Goal: Will verbalize positive feelings about self Outcome: Progressing Goal: Will identify appropriate support needs Outcome: Progressing   Problem: Health Behavior/Discharge Planning: Goal: Ability to manage health-related needs will improve Outcome: Progressing Goal: Goals will be collaboratively established with patient/family Outcome: Progressing   Problem: Self-Care: Goal: Ability to participate in self-care as condition permits will improve Outcome: Progressing Goal: Verbalization of feelings and concerns over difficulty with self-care will improve Outcome: Progressing Goal: Ability to communicate needs accurately will improve Outcome: Progressing   Problem: Nutrition: Goal: Risk of aspiration will decrease Outcome: Progressing Goal: Dietary intake will improve Outcome: Progressing   Problem: Education: Goal: Ability to describe self-care measures that may prevent or decrease complications (Diabetes Survival Skills Education) will improve Outcome: Progressing Goal: Individualized Educational Video(s) Outcome: Progressing   Problem: Coping: Goal: Ability to adjust to condition or change in health will improve Outcome: Progressing   Problem: Fluid Volume: Goal: Ability to maintain a balanced intake and output will improve Outcome: Progressing   Problem: Health Behavior/Discharge Planning: Goal: Ability to identify and utilize available resources and services will improve Outcome:  Progressing Goal: Ability to manage health-related needs will improve Outcome: Progressing   Problem: Metabolic: Goal: Ability to maintain appropriate glucose levels will improve Outcome: Progressing   Problem: Nutritional: Goal: Maintenance of adequate nutrition will improve Outcome: Progressing Goal: Progress toward achieving an optimal weight will improve Outcome: Progressing   Problem: Skin Integrity: Goal: Risk for impaired skin integrity will decrease Outcome: Progressing   Problem: Tissue Perfusion: Goal: Adequacy of tissue perfusion will improve Outcome: Progressing   Problem: Education: Goal: Knowledge of General Education information will improve Description: Including pain rating scale, medication(s)/side effects and non-pharmacologic comfort measures Outcome: Progressing   Problem: Health Behavior/Discharge Planning: Goal: Ability to manage health-related needs will improve Outcome: Progressing   Problem: Clinical Measurements: Goal: Ability to maintain clinical measurements within normal limits will improve Outcome: Progressing Goal: Will remain free from infection Outcome: Progressing Goal: Diagnostic test results will improve Outcome: Progressing Goal: Respiratory complications will improve Outcome: Progressing Goal: Cardiovascular complication will be avoided Outcome: Progressing   Problem: Activity: Goal: Risk for activity intolerance will decrease Outcome: Progressing   Problem: Nutrition: Goal: Adequate nutrition will be maintained Outcome: Progressing   Problem: Coping: Goal: Level of anxiety will decrease Outcome: Progressing   Problem: Elimination: Goal: Will not experience complications related to bowel motility Outcome: Progressing Goal: Will not experience complications related to urinary retention Outcome: Progressing   Problem: Pain Managment: Goal: General experience of comfort will improve Outcome: Progressing   Problem:  Safety: Goal: Ability to remain free from injury will improve Outcome: Progressing   Problem: Skin Integrity: Goal: Risk for impaired skin integrity will decrease Outcome: Progressing   

## 2023-04-02 NOTE — Progress Notes (Addendum)
Patient ID: Allison Caldwell, female   DOB: 08-Sep-1946, 77 y.o.   MRN: [id number]  Checked on patient in the afternoon.  2 family members were in the room.  I updated them on her condition.  She appears to be doing well less agitated not requiring restraints.  No Precedex needed overnight or this morning.  Seroquel seems to be working.  Family member confirmed that she does drink on a daily basis at home.  I believe we are past the point of withdrawal.  Continue to monitor  Will transfer out of the ICU and onto the hospitalist service.  Neurology will continue to follow.  Discussed with Dr. Allena Katz  ADDENDUM: add zofran prn for nausea, requested by floor nurse.

## 2023-04-02 NOTE — Progress Notes (Addendum)
STROKE TEAM PROGRESS NOTE   INTERVAL HISTORY RN at the bedside.  No family.  Agitated improved this am. Increased haldol working. Received tylenolol this am. Still complaining of pain all over. Will add Fioricet prn.  Received ativan 1mg  overnight, but did not need precedex. If stable for next 24 hrs, then consider transfer out of ICU.  Add sitter for agitation.  Dicussed with nurse to keep restraints off and avoid ativan.  Vitals:   04/02/23 0600 04/02/23 0630 04/02/23 0721 04/02/23 0936  BP: 129/78 (!) 154/97  136/85  Pulse: 64 72  77  Resp: 15 15    Temp:   98 F (36.7 C)   TempSrc:   Oral   SpO2: 100% 100%    Weight:      Height:       CBC:  Recent Labs  Lab 04/01/23 0807 04/02/23 0017  WBC 6.1 8.1  HGB 12.6 12.8  HCT 38.0 40.3  MCV 93.1 94.8  PLT 211 236    Basic Metabolic Panel:  Recent Labs  Lab 03/29/23 0644 03/29/23 1803 03/30/23 0035 04/01/23 0807 04/02/23 0017  NA 132*  --    < > 136 133*  K 4.0  --    < > 4.0 4.0  CL 96*  --    < > 95* 94*  CO2 26  --    < > 29 28  GLUCOSE 183*  --    < > 221* 175*  BUN 21  --    < > 22 25*  CREATININE 0.73  --    < > 0.82 0.81  CALCIUM 8.0*  --    < > 9.0 9.0  MG 2.1 1.9  --   --   --   PHOS 4.0 2.5  --   --   --    < > = values in this interval not displayed.    Lipid Panel:  No results for input(s): "CHOL", "TRIG", "HDL", "CHOLHDL", "VLDL", "LDLCALC" in the last 168 hours.  HgbA1c:  No results for input(s): "HGBA1C" in the last 168 hours.  Urine Drug Screen:  No results for input(s): "LABOPIA", "COCAINSCRNUR", "LABBENZ", "AMPHETMU", "THCU", "LABBARB" in the last 168 hours.   Alcohol Level  No results for input(s): "ETH" in the last 168 hours.   IMAGING past 24 hours DG CHEST PORT 1 VIEW  Result Date: 04/01/2023 CLINICAL DATA:  Chest congestion. EXAM: PORTABLE CHEST 1 VIEW COMPARISON:  09/22/2022. FINDINGS: Stable enlargement of the cardiac silhouette. No mediastinal or hilar masses. Clear lungs.   No convincing pleural effusion or pneumothorax. Enteric tube passes below the diaphragm, tip in the right upper quadrant consistent with the distal stomach. Skeletal structures are grossly intact. IMPRESSION: 1. No acute cardiopulmonary disease. Electronically Signed   By: Amie Portland M.D.   On: 04/01/2023 12:25    PHYSICAL EXAM  Temp:  [97.5 F (36.4 C)-98.6 F (37 C)] 98 F (36.7 C) (06/02 0721) Pulse Rate:  [59-111] 77 (06/02 0936) Resp:  [10-26] 15 (06/02 0630) BP: (69-166)/(31-137) 136/85 (06/02 0936) SpO2:  [92 %-100 %] 100 % (06/02 0630) Weight:  [123.5 kg] 123.5 kg (06/02 0320)  General - obese, well developed, lethargic.  Ophthalmologic - fundi not visualized due to noncooperation.  Cardiovascular - irregularly irregular heart rate and rhythm.  Neuro - keeps eyes closed but will open to voice, slightly agitated but improved. moderate dysarthria, following commands. Knew name, place and year. Opens eyes to command, will track examiner.  Slight  more left gaze preference, left pupil 3mm and reactive, right pupil enlarged, irregular sharp, consistent with previous surgery. Slight Left facial droop. RUE will lift to command, and squeeze hand. LUE 2/5 proximal. RLE 3/5 proximal and 4/5 distal. LLE slight withdraw to pain. Will wiggle toes to command. Sensation and coordination not quite cooperative, gait not tested.     ASSESSMENT/PLAN [name] is a 77 y.o. female with history of DM2, HTN, morbid obesity  presenting with L sided weakness and found to have hemorrhagic transformation of R parietal stroke. Not on [facility] takes aspirin at home .   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 CTA head & neck No emergent LVO. Large main pulmonary artery as seen with pulmonary  hypertension MRI: moderate right MCA and small R ACA infarcts CT repeat 5/28 patchy hemorrhage in the right posterior frontal and parietal lobes, which is likely unchanged in size, although some areas demonstrate slightly more hyperdense hemorrhage than on the prior exam. CT repeat 5/30 unchanged hemorrhagic infarct in the posterior right temporal lobe and unchanged subacute infarct in the right superior frontal gyrus. No new intracranial hemorrhage.  2D Echo: EF 50 to 55%  LDL 54 HgbA1c 9.8 UDS neg VTE prophylaxis - heparin subq  aspirin 81 mg daily prior to admission, now on No antithrombotic due to ICH. Patient will need DOAC for Afib once ICH resolves.    Therapy recommendations:  pending Disposition:  pending  Focal seizure from right brain 5/27 - 3 episodes of left arm rhythmic twitching then left eyelid twitching spreading to right eyelid. During the episodes, pt was able to talk and orientated. The episode lasted about 2-4 min. S/p ativan 2mg  x 2 keppra 500mg  bid -> 1500 bid-> po  depakote 500 bid->Q8h vimpat 200 bid -> po LTM EEG 5/27-28 showed 23 seizure arising from  right centro-parietal region, lasting 1 to 5 minutes.  LTM EEG 7/28-29 showed 6 seizure arising from  right centro-parietal region, lasting 1 to 2 minutes.  LTM EEG 7/29-30 no seizure seen LTM d/c 5/30 Depokate level 60->43->59->58>57 Now on PO depakote liquid 500mg  TID  PAF with RVR EKG shows Atrial Fibrillation Put on metoprolol 50 Consider DOAC once ICH resolves.  Hypertension Home meds: Benicar 40 mg home med BP high overnight Losartan 50mg  started, increased to 100mg  Lopressor 50mg  BID Back on cleviprex  SBP goal < 160 Long term BP goal normotensive  Hyperlipidemia Home meds: Pravastatin 20 mg LDL 54, goal < 70 Continue pravastatin 20 No high intensity statin due to LDL at goal Continue statin on discharge.  Diabetes type II Uncontrolled Home meds: Metformin 500 mg, insulin HgbA1c 9.8,  goal < 7.0 CBGs SSI Close PCP follow-up for better DM control  Agitation and restless increase seroquel to 100mg  BID On precedex Close monitoring Try to avoid ativan for agitation. Goal is better pain control.  Dysphagia  NPO now due to mental status Placed cortrak On TF @ 50cc SSI Q4h  Other Stroke Risk Factors Advanced Age >/= 66  Former Cigarette smoker Obesity, Body mass index is 46.73 kg/m., BMI >/= 30 associated with increased stroke risk, recommend weight loss, diet and exercise as appropriate  Family hx stroke (brother) ? Obstructive sleep apnea, not on CPAP at home  Other Active Problems Mild hyponatremia, Na 133->131->132->134 Hypokalemia, K  3.3->4.5->4.0->3.7 Hypomagnesemia - Mg 1.2->2.1->1.9  Pain: Fioricet prn. Avoid ativan if possible. Add sitter.  Consider transfer out of ICU tomorrow if stable and off precedex. DM coordinator consulted.   Hospital day # 8  This patient is critically ill due to stroke with hemorrhagic conversion, hypertensive emergency, PAF, seizure and at significant risk of neurological worsening, death form recurrent stroke, hemorrhagic conversion, status epilepticus, heart failure. This patient's care requires constant monitoring of vital signs, hemodynamics, respiratory and cardiac monitoring, review of multiple databases, neurological assessment, discussion with family, other specialists and medical decision making of high complexity. I spent 40 minutes of neurocritical care time in the care of this patient.  Discussed with pharmacy and ICU nurse.    04/02/2023 10:02 AM      To contact Stroke Continuity provider, please refer to [email or web address]. After hours, contact General Neurology

## 2023-04-03 ENCOUNTER — Encounter (HOSPITAL_COMMUNITY): Payer: Self-pay | Admitting: Neurology

## 2023-04-03 DIAGNOSIS — I152 Hypertension secondary to endocrine disorders: Secondary | ICD-10-CM

## 2023-04-03 DIAGNOSIS — E1159 Type 2 diabetes mellitus with other circulatory complications: Secondary | ICD-10-CM

## 2023-04-03 DIAGNOSIS — E785 Hyperlipidemia, unspecified: Secondary | ICD-10-CM

## 2023-04-03 DIAGNOSIS — I480 Paroxysmal atrial fibrillation: Secondary | ICD-10-CM | POA: Insufficient documentation

## 2023-04-03 DIAGNOSIS — E118 Type 2 diabetes mellitus with unspecified complications: Secondary | ICD-10-CM

## 2023-04-03 DIAGNOSIS — R569 Unspecified convulsions: Secondary | ICD-10-CM

## 2023-04-03 DIAGNOSIS — R410 Disorientation, unspecified: Secondary | ICD-10-CM

## 2023-04-03 DIAGNOSIS — I619 Nontraumatic intracerebral hemorrhage, unspecified: Secondary | ICD-10-CM | POA: Diagnosis not present

## 2023-04-03 DIAGNOSIS — R1310 Dysphagia, unspecified: Secondary | ICD-10-CM

## 2023-04-03 DIAGNOSIS — E1169 Type 2 diabetes mellitus with other specified complication: Secondary | ICD-10-CM

## 2023-04-03 DIAGNOSIS — E871 Hypo-osmolality and hyponatremia: Secondary | ICD-10-CM

## 2023-04-03 DIAGNOSIS — E876 Hypokalemia: Secondary | ICD-10-CM

## 2023-04-03 LAB — GLUCOSE, CAPILLARY
Glucose-Capillary: 127 mg/dL — ABNORMAL HIGH (ref 70–99)
Glucose-Capillary: 66 mg/dL — ABNORMAL LOW (ref 70–99)
Glucose-Capillary: 97 mg/dL (ref 70–99)
Glucose-Capillary: 179 mg/dL — ABNORMAL HIGH (ref 70–99)
Glucose-Capillary: 214 mg/dL — ABNORMAL HIGH (ref 70–99)
Glucose-Capillary: 127 mg/dL — ABNORMAL HIGH (ref 70–99)

## 2023-04-03 LAB — BASIC METABOLIC PANEL
Anion gap: 11 (ref 5–15)
BUN: 30 mg/dL — ABNORMAL HIGH (ref 8–23)
CO2: 28 mmol/L (ref 22–32)
Calcium: 9.3 mg/dL (ref 8.9–10.3)
Chloride: 99 mmol/L (ref 98–111)
Creatinine, Ser: 0.62 mg/dL (ref 0.44–1.00)
GFR, Estimated: >60 mL/min (ref >60)
Glucose, Bld: 66 mg/dL — ABNORMAL LOW (ref 70–99)
Potassium: 4 mmol/L (ref 3.5–5.1)
Sodium: 138 mmol/L (ref 135–145)

## 2023-04-03 LAB — CBC
HCT: 40.6 % (ref 36.0–46.0)
Hemoglobin: 13.2 g/dL (ref 12.0–15.0)
MCH: 30.1 pg (ref 26.0–34.0)
MCHC: 32.5 g/dL (ref 30.0–36.0)
MCV: 92.5 fL (ref 80.0–100.0)
Platelets: 263 10*3/uL (ref 150–400)
RBC: 4.39 MIL/uL (ref 3.87–5.11)
RDW: 16.2 % — ABNORMAL HIGH (ref 11.5–15.5)
WBC: 6.8 10*3/uL (ref 4.0–10.5)
nRBC: 0 % (ref 0.0–0.2)

## 2023-04-03 LAB — VALPROIC ACID LEVEL: Valproic Acid Lvl: 30 ug/mL — ABNORMAL LOW (ref 50.0–100.0)

## 2023-04-03 MED ORDER — DEXTROSE 50 % IV SOLN
INTRAVENOUS | Status: AC
  Administered 2023-04-03: 25 mL
  Filled 2023-04-03: qty 50

## 2023-04-03 NOTE — Evaluation (Signed)
Speech Language Pathology Evaluation Patient Details Name: Allison Caldwell MRN: [id number] DOB: [birth year] Today's Date: 04/03/2023 Time: 4098-1191 SLP Time Calculation (min) (ACUTE ONLY): 9 min  Problem List:  Patient Active Problem List   Diagnosis Date Noted   Focal seizure (HCC) 04/03/2023   Paroxysmal atrial fibrillation (HCC) 04/03/2023   Delirium 04/03/2023   Dysphagia 04/03/2023   Hemorrhagic stroke (HCC) 03/25/2023   Acute hypoxic respiratory failure (HCC) 09/23/2022   Acute bronchitis due to respiratory syncytial virus (RSV) 09/23/2022   Pulmonary hypertension (HCC) 09/23/2022   Hypokalemia 05/07/2022   Bilateral hearing loss due to cerumen impaction 05/27/2021   Peeling skin 05/27/2021   Systolic murmur 09/28/2020   Benign paroxysmal positional vertigo 04/05/2018   Elevated rheumatoid factor 10/16/2016   Migratory pain 10/10/2016   Vitiligo 05/05/2016   Hypomagnesemia 04/29/2014   Hyponatremia 11/20/2011    Class: Acute   Hypertension associated with diabetes (HCC) 05/12/2010   ANXIETY DEPRESSION 04/06/2010   GLAUCOMA 01/20/2009   Hyperlipidemia associated with type 2 diabetes mellitus (HCC) 11/20/2008   Diabetes mellitus type 2 with complications (HCC) 12/27/2006   Morbid obesity with BMI of 45.0-49.9, adult (HCC) 12/27/2006   Past Medical History:  Past Medical History:  Diagnosis Date   Anxiety    Cardiomyopathy    Degenerative joint disease    Depression    Diabetes mellitus    NIDDM   Hypertension    Morbid obesity with BMI of 45.0-49.9, adult (HCC)    TIA (transient ischemic attack)    Past Surgical History:  Past Surgical History:  Procedure Laterality Date   CESAREAN SECTION     times 2   EYE SURGERY     laser - right eye 2010   left wrist     surgical repair   ORIF ANKLE FRACTURE     right   HPI:  Allison Caldwell is a 77 y.o. female with PMH significant for DM2, HTN, morbid obesity who fell as she got out of her bed. She hit  her head on the dresser. MRI Moderate-sized acute hemorrhagic right MCA infarct, small acute right ACA territory infarct, moderate chronic small vessel ischemic disease.   Assessment / Plan / Recommendation Clinical Impression  Pt demonstrates dysarthria and cognitive impairments through informal assessment measures. Her comprehension and expression is intact. She has decreased articulatory precision although majority of utterances are intelligible. Pt's sustained attention is decreased and she is easily distracted by her environment or her own thoughts. She independently self corrected during orientation for place, able to state she had a stroke and was one day off on the date. She was able to state call bell for calling RN for assistance. Suspect pt's verbal abilities are likely better than her performance during tasks due to her decreased attention. Pt will continue ST on acute care for speech and cognition and post acute.    SLP Assessment  SLP Recommendation/Assessment: Patient needs continued Speech Lanaguage Pathology Services SLP Visit Diagnosis: Dysphagia, unspecified (R13.10)    Recommendations for follow up therapy are one component of a multi-disciplinary discharge planning process, led by the attending physician.  Recommendations may be updated based on patient status, additional functional criteria and insurance authorization.    Follow Up Recommendations  Skilled nursing-short term rehab (<3 hours/day)    Assistance Recommended at Discharge  Frequent or constant Supervision/Assistance  Functional Status Assessment Patient has had a recent decline in their functional status and demonstrates the ability to make significant improvements in function in  a reasonable and predictable amount of time.  Frequency and Duration min 2x/week  2 weeks      SLP Evaluation Cognition  Overall Cognitive Status: Impaired/Different from baseline Arousal/Alertness: Awake/alert Orientation Level:  Oriented to person;Oriented to place;Oriented to situation;Disoriented to time Year: 2024 (self corrected) Month: June Attention: Sustained Sustained Attention: Impaired Memory:  (will continue to assess) Awareness: Impaired Awareness Impairment: Emergent impairment;Anticipatory impairment Problem Solving: Impaired Safety/Judgment: Impaired       Comprehension  Auditory Comprehension Overall Auditory Comprehension: Appears within functional limits for tasks assessed Visual Recognition/Discrimination Discrimination: Not tested Reading Comprehension Reading Status: Not tested    Expression Expression Primary Mode of Expression: Verbal Verbal Expression Overall Verbal Expression: Appears within functional limits for tasks assessed Naming:  (named 7 animals one min) Pragmatics: Impairment Impairments: Topic maintenance Interfering Components: Attention Written Expression Dominant Hand: Right Written Expression: Not tested   Oral / Motor  Oral Motor/Sensory Function Overall Oral Motor/Sensory Function: Mild impairment Facial ROM: Reduced left;Suspected CN VII (facial) dysfunction Facial Symmetry: Abnormal symmetry left;Suspected CN VII (facial) dysfunction Facial Strength: Reduced left;Suspected CN VII (facial) dysfunction Motor Speech Overall Motor Speech: Impaired Articulation: Impaired Level of Impairment: Conversation Intelligibility: Intelligible Motor Planning: Witnin functional limits            [name] 04/03/2023, 10:20 AM

## 2023-04-03 NOTE — Inpatient Diabetes Management (Signed)
Inpatient Diabetes Program Recommendations  AACE/ADA: New Consensus Statement on Inpatient Glycemic Control (2015)  Target Ranges:  Prepandial:   less than 140 mg/dL      Peak postprandial:   less than 180 mg/dL (1-2 hours)      Critically ill patients:  140 - 180 mg/dL   Lab Results  Component Value Date   GLUCAP 179 (H) 04/03/2023   HGBA1C 8.8 (H) 03/25/2023    Review of Glycemic Control  Latest Reference Range & Units 04/02/23 23:52 04/03/23 04:04 04/03/23 08:16 04/03/23 09:06 04/03/23 11:39  Glucose-Capillary 70 - 99 mg/dL 409 (H) 811 (H) 66 (L) 97 179 (H)  (H): Data is abnormally high (L): Data is abnormally low  Diabetes history: DM2 Outpatient Diabetes medications: Metformin 500 mg BID Current orders for Inpatient glycemic control: Novolog 0-20 units Q4H,  Osmolite @ 50 ml/hr  Referral received for DM.  Noted tube feed coverage was discontinued due to 66 mg/dL this am.  If trends start to increase >180 mg/dL, might consider adding half tube feed coverage back:  Novolog 2 units Q4H.  Hold if feeds are held or discontinued.    Will continue to follow while inpatient.  Thank you, Dulce Sellar, MSN, CDCES Diabetes Coordinator Inpatient Diabetes Program [phone] (team pager from 8a-5p)

## 2023-04-03 NOTE — Progress Notes (Signed)
Speech Language Pathology Treatment: Dysphagia  Patient Details Name: Allison Caldwell MRN: [id number] DOB: 1946-08-23 Today's Date: 04/03/2023 Time: 4098-1191 SLP Time Calculation (min) (ACUTE ONLY): 9 min  Assessment / Plan / Recommendation Clinical Impression  Pt seen for dysphagia therapy and ability to re-start po's. She is showing s/s aspiration with immediate cough with straw sip water intermittently. Pt had been on regular diet following initial BSE, began to have seizures and was made NPO. She self fed applesauce without overt difficulty. Prior to initiation of po's recommend instrumental MBS (in bariatric MBS suite) planned for tomorrow. Continue oral care and tube feeds.    HPI HPI: Allison Caldwell is a 77 y.o. female with PMH significant for DM2, HTN, morbid obesity who fell as she got out of her bed. She hit her head on the dresser. MRI Moderate-sized acute hemorrhagic right MCA infarct, small acute right ACA territory infarct, moderate chronic small vessel ischemic disease.      SLP Plan  MBS      Recommendations for follow up therapy are one component of a multi-disciplinary discharge planning process, led by the attending physician.  Recommendations may be updated based on patient status, additional functional criteria and insurance authorization.    Recommendations  Diet recommendations: NPO Medication Administration: Via alternative means                  Oral care QID;Oral care prior to ice chip/H20   Frequent or constant Supervision/Assistance Dysphagia, unspecified (R13.10)     MBS     [name]  04/03/2023, 10:00 AM

## 2023-04-03 NOTE — Hospital Course (Signed)
77 y.o. female with history of DM2, HTN, morbid obesity  presenting with L sided weakness and found to have hemorrhagic transformation of R parietal stroke.

## 2023-04-03 NOTE — Care Management Important Message (Signed)
Important Message  Patient Details  Name: Allison Caldwell MRN: [id number] Date of Birth: 1946/02/11   Medicare Important Message Given:  Yes     Dorena Bodo 04/03/2023, 3:38 PM

## 2023-04-03 NOTE — Progress Notes (Addendum)
STROKE TEAM PROGRESS NOTE   INTERVAL HISTORY No one is at the bedside.  Patient is lying comfortably in bed.  Vital signs stable.  She is sleepy but opens eyes and follows commands well.  Moves right side purposefully against gravity.  Left hemiparesis but has some movement of the bed. No seizures.  Valproic acid level is suboptimal at 30 mg percent.  Vitals:   04/02/23 1945 04/02/23 2354 04/03/23 0403 04/03/23 0811  BP: (!) 149/85 (!) 137/103 139/84 (!) 145/98  Pulse: 86 96 83 (!) 102  Resp: 20 19 18 20   Temp: 97.8 F (36.6 C) 97.9 F (36.6 C) 98.4 F (36.9 C) 98 F (36.7 C)  TempSrc:  Oral Oral Oral  SpO2: 96% 94% 98% 94%  Weight:      Height:       CBC:  Recent Labs  Lab 04/02/23 0017 04/03/23 0801  WBC 8.1 6.8  HGB 12.8 13.2  HCT 40.3 40.6  MCV 94.8 92.5  PLT 236 263   Basic Metabolic Panel:  Recent Labs  Lab 03/29/23 0644 03/29/23 1803 03/30/23 0035 04/02/23 0017 04/03/23 0801  NA 132*  --    < > 133* 138  K 4.0  --    < > 4.0 4.0  CL 96*  --    < > 94* 99  CO2 26  --    < > 28 28  GLUCOSE 183*  --    < > 175* 66*  BUN 21  --    < > 25* 30*  CREATININE 0.73  --    < > 0.81 0.62  CALCIUM 8.0*  --    < > 9.0 9.3  MG 2.1 1.9  --   --   --   PHOS 4.0 2.5  --   --   --    < > = values in this interval not displayed.   Lipid Panel:   Ldl 54 mg %  HgbA1c:   8.8  Urine Drug Screen:   negative      Alcohol Level  No results for input(s): "ETH" in the last 168 hours.   IMAGING past 24 hours No results found.  PHYSICAL EXAM  Temp:  [97.7 F (36.5 C)-98.4 F (36.9 C)] 98 F (36.7 C) (06/03 0811) Pulse Rate:  [70-102] 102 (06/03 0811) Resp:  [17-26] 20 (06/03 0811) BP: (128-175)/(84-103) 145/98 (06/03 0811) SpO2:  [94 %-100 %] 94 % (06/03 0811)  General - obese, well developed, lethargic.  Ophthalmologic - fundi not visualized due to noncooperation.  Cardiovascular - irregularly irregular heart rate and rhythm.  Neuro - keeps eyes closed  but will open to voice, slightly agitated but improved. moderate dysarthria, following commands. Knew name, place and year. Opens eyes to command, will track examiner.  Slight more left gaze preference, left pupil 3mm and reactive, right pupil enlarged, irregular sharp, consistent with previous surgery. Slight Left facial droop. RUE will lift to command, and squeeze hand. LUE 2/5 proximal. RLE 3/5 proximal and 4/5 distal. LLE slight withdraw to pain. Will wiggle toes to command. Sensation and coordination not quite cooperative, gait not tested.     ASSESSMENT/PLAN [name] is a 77 y.o. female with history of DM2, HTN, morbid obesity  presenting with L sided weakness and found to have hemorrhagic transformation of R parietal stroke. Not on [facility], takes aspirin at home .   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 CTA head & neck No emergent LVO. Large main pulmonary artery as seen with pulmonary hypertension MRI: moderate right MCA and small R ACA infarcts CT repeat 5/28 patchy hemorrhage in the right posterior frontal and parietal lobes, which is likely unchanged in size, although some areas demonstrate slightly more hyperdense hemorrhage than on the prior exam. CT repeat 5/30 unchanged hemorrhagic infarct in the posterior right temporal lobe and unchanged subacute infarct in the right superior frontal gyrus. No new intracranial hemorrhage.  2D Echo: EF 50 to 55%  LDL 54 HgbA1c 9.8 UDS neg VTE prophylaxis - heparin subq  aspirin 81 mg daily prior to admission, now on No antithrombotic due to ICH. Patient will need DOAC for Afib once ICH resolves.    Therapy recommendations: CLR  disposition:  pending  Focal seizure from right brain 5/27 - 3 episodes of left arm rhythmic twitching then left  eyelid twitching spreading to right eyelid. During the episodes, pt was able to talk and orientated. The episode lasted about 2-4 min. S/p ativan 2mg  x 2 keppra 500mg  bid -> 1500 bid-> po  depakote 500 bid->Q8h vimpat 200 bid -> po LTM EEG 5/27-28 showed 23 seizure arising from  right centro-parietal region, lasting 1 to 5 minutes.  LTM EEG 7/28-29 showed 6 seizure arising from  right centro-parietal region, lasting 1 to 2 minutes.  LTM EEG 7/29-30 no seizure seen LTM d/c 5/30 Depokate level 60->43->59->58>57 Now on PO depakote liquid 500mg  TID  PAF with RVR EKG shows Atrial Fibrillation Put on metoprolol 50 Consider DOAC once ICH resolves.  Hypertension Home meds: Benicar 40 mg home med BP high overnight Losartan 50mg  started, increased to 100mg  Lopressor 50mg  BID Back on cleviprex  SBP goal < 160 Long term BP goal normotensive  Hyperlipidemia Home meds: Pravastatin 20 mg LDL 54, goal < 70 Continue pravastatin 20 No high intensity statin due to LDL at goal Continue statin on discharge.  Diabetes type II Uncontrolled Home meds: Metformin 500 mg, insulin HgbA1c 9.8, goal < 7.0 CBGs SSI Close PCP follow-up for better DM control  Agitation and restless increase seroquel to 100mg  BID On precedex Close monitoring Try to avoid ativan for agitation. Goal is better pain control.  Dysphagia  NPO now due to mental status Placed cortrak On TF @ 50cc SSI Q4h  Other Stroke Risk Factors Advanced Age >/= 26  Former Cigarette smoker Obesity, Body mass index is 46.73 kg/m., BMI >/= 30 associated with increased stroke risk, recommend weight loss, diet and exercise as appropriate  Family hx stroke (brother) ? Obstructive sleep apnea, not on CPAP at home  Other Active Problems Mild hyponatremia, Na 133->131->132->134 Hypokalemia, K 3.3->4.5->4.0->3.7 Hypomagnesemia - Mg 1.2->2.1->1.9  Pain: Fioricet prn. Avoid ativan if possible. Add sitter.  Consider transfer out of  ICU tomorrow if stable and off precedex. DM coordinator consulted.   Hospital day # 9   I have personally obtained history,examined this patient, reviewed notes, independently viewed imaging studies, participated in medical decision making and plan of care.ROS completed by me personally and pertinent positives fully documented  I have made any additions or clarifications directly to the above note. Agree with note above.  Continue to hold antiplatelet therapy and anticoagulation for now.  Continue physical occupational therapy.  Likely transfer to rehab.  No family available at the bedside for discussion.  Greater than 50% time  during this 35-minute visit was spent on counseling and coordination of care about embolic stroke and hemorrhagic transformation and A-fib and discussion patient and care team and answering questions. Stroke team will sign off.  Kindly call for questions.  Discussed with Dr. Raelyn Number, MD Medical Director [facility] Pager: [phone] 04/03/2023 11:37 AM    04/03/2023 11:32 AM      To contact Stroke Continuity provider, please refer to [email or web address]. After hours, contact General Neurology

## 2023-04-03 NOTE — Progress Notes (Signed)
Progress Note   Patient: Allison Caldwell ZOX:[id number] DOB: [birth year] DOA: 03/25/2023     9 DOS: the patient was seen and examined on 04/03/2023 at 10:01AM      Brief hospital course: 77 y.o. female with history of DM2, HTN, morbid obesity  presenting with L sided weakness and found to have hemorrhagic transformation of R parietal stroke.      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 - CT angiogram of the head and neck showed no significant intracranial disease, no LVO - Echocardiogram showed no cardiogenic source of embolism, EF 50 to 55% - Carotid imaging unremarkable   - Lipids ordered: LDL 54 - Aspirin held on admission due to hemorrhage - Atrial fibrillation: New onset at admission, anticoagulation contraindicated at present, will need to start DOAC at a later date - tPA not given because parietal hemorrhage - Dysphagia screen ordered in ER - PT eval ordered: Recommending SNF - Former smoker    Agitated delirium This developed while in the intensive care unit, required Precedex.  There was consideration of alcohol withdrawal but this has been ruled out.  Appears to just be hospital delirium in a 77 yo.  In setting of recurrent seizures. - Continue home sertraline - Continue Seroquel - Standard delirium precautions: blinds open and lights on during day, TV off, minimize interruptions at night, glasses/hearing aids, PT/OT, avoiding Beers list medications     Complex partial seizures Noted to have several episodes of rhythmic arm and eyelid twitching.  Underwent LTM EEG that showed seizures arising from the vicinity of the stroke.  Loaded with antiepileptics. - Continue Depakote, Keppra, Vimpat  New onset paroxysmal atrial fibrillation with RVR New diagnosis at admission, CHA2DS2-VASc 7.   Anticoagulation contraindicated in setting of hemorrhage, will need to be started at a later date - Continue metoprolol  Essential hypertension Blood pressure slightly high here - Continue losartan, metoprolol  Mixed hyperlipidemia -Continue home pravastatin  Uncontrolled type 2 diabetes with hyperglycemia Hemoglobin A1c 9.8%.  Hyperglycemic on admission. Hypoglycemia this mroning - Hold home metformin   - Continue sliding scale corrections - Hold scheduled insulin  Dysphagia Due to stroke - Continue tube feeds via core track - Continue sliding scale correction insulin - Speech therapy consultation  Morbid obesity BMI 46.7  Hyponatremia Resolved  Hypokalemia Resolved  Hypomagnesemia Resolved        Subjective: No nursing concerns, patient remains somewhat difficult to understand and agitated and not fully cooperative with cares.  No fever, no respiratory distress.     Physical Exam: BP (!) 145/75 (BP Location: Right Arm)   Pulse 65   Temp (!) 97.5 F (36.4 C) (Oral)   Resp 20   Ht 5\' 4"  (1.626 m)   Wt 123.5 kg   SpO2 96%   BMI 46.73 kg/m   Obese elderly female, lying in bed, no acute distress RRR, no murmurs, no peripheral edema Respiratory rate normal, lungs clear without rales or wheezes Abdomen soft without tenderness palpation or guarding, no ascites or distention Appears to have flaccid left side, right side seems generally weak but probably for 4/5 speech is severely dysarthric, slurred and impossible to understand.  She is oriented to self only, may be able to say that she is in the "hospital".    Data Reviewed: Discussed with neurology Hypoglycemic this morning CBC normal Sodium up to  138, creatinine stable 0.6  Family Communication: None    Disposition: Status is: Inpatient         Author: Alberteen Sam, MD 04/03/2023 3:22 PM  For on call review [email or web address].

## 2023-04-03 NOTE — Progress Notes (Signed)
Inpatient Rehab Admissions Coordinator:   Per therapy recommendations, patient was screened for CIR candidacy by Megan Salon, MS, CCC-SLP . At this time, Pt. is not yet attempting OOB and is not at a level to tolerate the intensity of CIR. However,  Pt. may have potential to progress to becoming a potential CIR candidate, so CIR admissions team will follow and monitor for progress and participation with therapies and place consult order if Pt. appears to be an appropriate candidate. Please contact me with any questions.   Megan Salon, MS, CCC-SLP Rehab Admissions Coordinator  [phone] (celll) [phone] (office)

## 2023-04-03 NOTE — Progress Notes (Signed)
Physical Therapy Treatment Patient Details Name: Allison Caldwell MRN: [id number] DOB: 06/15/1946 Today's Date: 04/03/2023   History of Present Illness [name] is a 77 y.o. female who presented after a fall out of bed, now has L sided weakness. 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 Witnessed seizures during admission. Undergoing LTM EEG. PMHx:  DM2, HTN, morbid obesity    PT Comments    Pt now oriented to self/place/time, however, continues to demonstrate poor attention span and awareness of deficits. Pt requiring two person maximal assist for bed mobility. Demonstrates heavy left lateral lean in sitting. Emphasis on promoting midline with max tactile and verbal facilitation. Pt requesting to lie back down. Noted to have bowel incontinence and NT present to assist with rolling for peri care and linen change. Patient will benefit from continued inpatient follow up therapy, <3 hours/day in order to address deficits and maximize functional mobility.    Recommendations for follow up therapy are one component of a multi-disciplinary discharge planning process, led by the attending physician.  Recommendations may be updated based on patient status, additional functional criteria and insurance authorization.  Follow Up Recommendations  Can patient physically be transported by private vehicle: No    Assistance Recommended at Discharge Frequent or constant Supervision/Assistance  Patient can return home with the following Two people to help with walking and/or transfers;Two people to help with bathing/dressing/bathroom;Assistance with cooking/housework;Assist for transportation;Help with stairs or ramp for entrance   Equipment Recommendations  Other (comment) (TBA)    Recommendations for Other Services       Precautions / Restrictions Precautions Precautions: Fall;Other (comment) Precaution Comments: L inattention,  hemiparesis, R hand mitt Restrictions Weight Bearing Restrictions: No     Mobility  Bed Mobility Overal bed mobility: Needs Assistance Bed Mobility: Supine to Sit, Sit to Supine, Rolling Rolling: Max assist, +2 for physical assistance   Supine to sit: Max assist, +2 for physical assistance Sit to supine: Max assist, +2 for physical assistance   General bed mobility comments: Decreased initiation, assist for trunk and BLE's    Transfers                   General transfer comment: deferred due to bowel incontinence and pt requesting to lie back down    Ambulation/Gait                   Stairs             Wheelchair Mobility    Modified Rankin (Stroke Patients Only) Modified Rankin (Stroke Patients Only) Pre-Morbid Rankin Score: Slight disability Modified Rankin: Severe disability     Balance Overall balance assessment: Needs assistance Sitting-balance support: Feet supported Sitting balance-Leahy Scale: Poor Sitting balance - Comments: L lateral lean, pushing through R hand. Requiring min-maxA                                    Cognition Arousal/Alertness: Awake/alert Behavior During Therapy: Flat affect, Restless Overall Cognitive Status: Impaired/Different from baseline Area of Impairment: Orientation, Attention, Memory, Following commands, Safety/judgement, Awareness, Problem solving                 Orientation Level: Disoriented to, Situation Current Attention Level: Focused Memory: Decreased short-term memory, Decreased recall of precautions Following Commands: Follows one step commands inconsistently Safety/Judgement: Decreased awareness of safety, Decreased awareness of  deficits Awareness: Intellectual Problem Solving: Slow processing, Decreased initiation, Difficulty sequencing, Requires verbal cues, Requires tactile cues General Comments: Pt keeping eyes closed for majority of session, now oriented to  self/place/time. Poor attention to tasks and restless        Exercises Other Exercises Other Exercises: R lateral leans onto elbow x 3    General Comments        Pertinent Vitals/Pain Pain Assessment Pain Assessment: Faces Faces Pain Scale: No hurt    Home Living       Type of Home: Apartment                  Prior Function            PT Goals (current goals can now be found in the care plan section) Acute Rehab PT Goals Patient Stated Goal: not stated Potential to Achieve Goals: Fair    Frequency    Min 3X/week      PT Plan Discharge plan needs to be updated;Frequency needs to be updated    Co-evaluation              AM-PAC PT "6 Clicks" Mobility   Outcome Measure  Help needed turning from your back to your side while in a flat bed without using bedrails?: Total Help needed moving from lying on your back to sitting on the side of a flat bed without using bedrails?: Total Help needed moving to and from a bed to a chair (including a wheelchair)?: Total Help needed standing up from a chair using your arms (e.g., wheelchair or bedside chair)?: Total Help needed to walk in hospital room?: Total Help needed climbing 3-5 steps with a railing? : Total 6 Click Score: 6    End of Session   Activity Tolerance: Patient limited by fatigue Patient left: in bed;with call bell/phone within reach;with bed alarm set Nurse Communication: Mobility status PT Visit Diagnosis: Muscle weakness (generalized) (M62.81);Hemiplegia and hemiparesis;Other abnormalities of gait and mobility (R26.89) Hemiplegia - Right/Left: Left Hemiplegia - dominant/non-dominant: Non-dominant Hemiplegia - caused by: Cerebral infarction     Time: 2956-2130 PT Time Calculation (min) (ACUTE ONLY): 30 min  Charges:  $Therapeutic Activity: 23-37 mins                     Lillia Pauls, PT, DPT Acute Rehabilitation Services Office [phone]    Norval Morton 04/03/2023,  1:02 PM

## 2023-04-04 ENCOUNTER — Inpatient Hospital Stay (HOSPITAL_COMMUNITY): Payer: Medicare HMO

## 2023-04-04 DIAGNOSIS — I619 Nontraumatic intracerebral hemorrhage, unspecified: Secondary | ICD-10-CM | POA: Diagnosis not present

## 2023-04-04 DIAGNOSIS — I10 Essential (primary) hypertension: Secondary | ICD-10-CM

## 2023-04-04 DIAGNOSIS — I480 Paroxysmal atrial fibrillation: Secondary | ICD-10-CM | POA: Diagnosis not present

## 2023-04-04 DIAGNOSIS — E119 Type 2 diabetes mellitus without complications: Secondary | ICD-10-CM

## 2023-04-04 DIAGNOSIS — Z794 Long term (current) use of insulin: Secondary | ICD-10-CM

## 2023-04-04 DIAGNOSIS — R569 Unspecified convulsions: Secondary | ICD-10-CM | POA: Diagnosis not present

## 2023-04-04 LAB — BASIC METABOLIC PANEL
Anion gap: 10 (ref 5–15)
BUN: 26 mg/dL — ABNORMAL HIGH (ref 8–23)
CO2: 27 mmol/L (ref 22–32)
Calcium: 9 mg/dL (ref 8.9–10.3)
Chloride: 96 mmol/L — ABNORMAL LOW (ref 98–111)
Creatinine, Ser: 0.54 mg/dL (ref 0.44–1.00)
GFR, Estimated: >60 mL/min (ref >60)
Glucose, Bld: 199 mg/dL — ABNORMAL HIGH (ref 70–99)
Potassium: 4 mmol/L (ref 3.5–5.1)
Sodium: 133 mmol/L — ABNORMAL LOW (ref 135–145)

## 2023-04-04 LAB — CBC
HCT: 39.2 % (ref 36.0–46.0)
Hemoglobin: 12.8 g/dL (ref 12.0–15.0)
MCH: 30 pg (ref 26.0–34.0)
MCHC: 32.7 g/dL (ref 30.0–36.0)
MCV: 91.8 fL (ref 80.0–100.0)
Platelets: 271 10*3/uL (ref 150–400)
RBC: 4.27 MIL/uL (ref 3.87–5.11)
RDW: 16.1 % — ABNORMAL HIGH (ref 11.5–15.5)
WBC: 7.3 10*3/uL (ref 4.0–10.5)
nRBC: 0 % (ref 0.0–0.2)

## 2023-04-04 LAB — GLUCOSE, CAPILLARY
Glucose-Capillary: 118 mg/dL — ABNORMAL HIGH (ref 70–99)
Glucose-Capillary: 133 mg/dL — ABNORMAL HIGH (ref 70–99)
Glucose-Capillary: 140 mg/dL — ABNORMAL HIGH (ref 70–99)
Glucose-Capillary: 186 mg/dL — ABNORMAL HIGH (ref 70–99)
Glucose-Capillary: 189 mg/dL — ABNORMAL HIGH (ref 70–99)
Glucose-Capillary: 216 mg/dL — ABNORMAL HIGH (ref 70–99)
Glucose-Capillary: 98 mg/dL (ref 70–99)

## 2023-04-04 LAB — VALPROIC ACID LEVEL: Valproic Acid Lvl: 41 ug/mL — ABNORMAL LOW (ref 50.0–100.0)

## 2023-04-04 MED ORDER — GLUCERNA 1.5 CAL PO LIQD
1000.0000 mL | ORAL | Status: DC
  Administered 2023-04-04: 1000 mL
  Filled 2023-04-04: qty 1000

## 2023-04-04 MED ORDER — ENOXAPARIN SODIUM 40 MG/0.4ML IJ SOSY
40.0000 mg | PREFILLED_SYRINGE | INTRAMUSCULAR | Status: DC
  Administered 2023-04-04 – 2023-04-09 (×6): 40 mg via SUBCUTANEOUS
  Filled 2023-04-04 (×6): qty 0.4

## 2023-04-04 MED ORDER — AMLODIPINE BESYLATE 5 MG PO TABS: 5.0000 mg | ORAL_TABLET | Freq: Every day | ORAL | Status: DC

## 2023-04-04 MED ORDER — BANATROL TF EN LIQD
60.0000 mL | Freq: Two times a day (BID) | ENTERAL | Status: DC
  Administered 2023-04-04 – 2023-04-07 (×7): 60 mL
  Filled 2023-04-04 (×7): qty 60

## 2023-04-04 MED ORDER — AMLODIPINE BESYLATE 5 MG PO TABS
5.0000 mg | ORAL_TABLET | Freq: Every day | ORAL | Status: DC
  Administered 2023-04-04 – 2023-04-05 (×2): 5 mg
  Filled 2023-04-04 (×2): qty 1

## 2023-04-04 MED ORDER — FREE WATER
30.0000 mL | Status: DC
  Administered 2023-04-04 – 2023-04-07 (×20): 30 mL

## 2023-04-04 MED ORDER — GLUCERNA 1.5 CAL PO LIQD
1000.0000 mL | ORAL | Status: DC
  Filled 2023-04-04: qty 1000

## 2023-04-04 MED ORDER — ENSURE ENLIVE PO LIQD
237.0000 mL | Freq: Two times a day (BID) | ORAL | Status: DC
  Administered 2023-04-05 – 2023-04-07 (×6): 237 mL via ORAL
  Administered 2023-04-08: 115 mL via ORAL
  Administered 2023-04-08 – 2023-04-10 (×5): 237 mL via ORAL

## 2023-04-04 NOTE — Hospital Course (Addendum)
Subjective:  No acute overnight events. Patient feeling well this morning. She reports eating sausage, eggs, and oatmeal for breakfast. She says her appetite is better. Discussed plan for discharge to SNF today. All questions and concerns were answered.  Physical Exam:  Constitutional: Lying comfortably in bed watching TV. Very talkative and interactive, somewhat confused. No acute distress. Eyes: Able to track across the room. Cardiovascular: Regular rate, regular rhythm. No murmurs, rubs, or gallops. Pulmonary/Chest: Normal work of breathing on room air. Lungs clear to auscultation anteriorly. Abdominal: Soft, non-tender, non-distended. Normal bowel sounds. Neurological: Left-sided upper extremity weakness on fine motor movements such as grip strength and coordination. No concerns with right upper extremity. Bilateral LE with 5/5 strength on hip flexion, plantarflexion, and dorsiflexion. Sensation intact bilaterally.  Allison Caldwell is a 77 yo female with HTN, T2DM, and obesity who presented with left sided weakness and was admitted on 5/25 for hemorrhagic transformation of R parietal stroke c/b seizures. The patient was transferred out of the neuro ICU on 6/3.   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 MRI brain on admission showed a moderate-sized acute hemorrhagic right MCA infarct and small acute right ACA infarct, likely embolic due to newly diagnosed atrial fibrillation. No anticoagulants or antiplatelets were initially started due to ICH. Further imaging with CTA head neck revealed no emergent vascular findings, but did show a large main pulmonary artery as seen with pulmonary hypertension. Echo was without PFO and showed an EF of 50-55% and moderate LVH. Carotid imaging unremarkable. Labs notable for an LDL at goal, at 54. Patient gradually progressing towards therapy goals. She continues to have some  left upper extremity weakness but is learning to compensate. She is currently on a dysphagia 2 diet, consuming most calories through [employer] and [employer]. Anticipate improvement at SNF as she continues recovering.  Focal seizure from right brain Patient had 3 episodes of left arm rhythmic twitching and then left eyelid twitching spreading to the right eyelid on 5/27, but patient was able to talk and remained oriented during the episode. Episode lasted 2-4 min and she received ativan 2 mg. After, she was loaded with keppra 3 g, followed by 500 mg bid. She had a breakthrough seizure the next day and her keppra was increased to 1500 mg bid. The patient was also started on a depakote load, followed by 500 mg bid. She continued to have breakthrough seizures and then was also started on Vimpat 200 mg bid. Patient has remained seizure-free since 5/29. Will continue current antiepileptic regimen of Depakote 500 mg TID, Keppra 1500 mg BID, and Vimpat 200 mg daily at discharge.  New onset atrial fibrillation Patient presented in atrial fibrillation with RVR. Etiology of stroke determined to likely be embolic in nature in the setting on new onset atrial fibrillation. Anticoagulation was not initially started due to ICH. CT head was repeated on 6/9, which showed decreased conspicuity of hemorrhage within the infarct along R temporoparietal junction. Apixaban 5 mg BID was subsequently started on 6/10. Patient self-converted to sinus rhythm on 6/5. She remains in sinus rhythm of day of discharge. Will continue metoprolol succinate 100 mg daily and apixaban 5 mg BID at discharge.   Delirium Hospital course was complicated by agitated delirium in ICU. Patient was started on quetiapine 100 mg BID. Delirium improved once patient was transferred to floor. Quetiapine was weaned to 50 mg daily. Patient is alert and easily directable on  day of discharge, though she continues to have moments of inattention and confusion that  worsen with fatigue. Anticipate quetiapine can be weaned once at [facility].  Hypertension On olmesartan 40 mg at home. BP was high during admission and patient required a Cleviprex drip while in the ICU. Once she was transferred out of the neuro ICU, BP was managed with PO amlodipine 10 mg, losartan 100 mg, and metoprolol succinate 100 mg daily. Her BP was well-controlled on day of discharge, so will continue this antihypertensive regimen at discharge.  Hyperlipidemia Patient taking pravastatin 20 mg daily at home. LDL 54 at goal. Will continue pravastatin 20 mg daily at discharge.  Type 2 Diabetes HgbA1c 8.8. Patient received SSI while hospitalized. Started on glargine 10 units on day of discharge, and will continue at discharge.

## 2023-04-04 NOTE — Progress Notes (Signed)
Modified Barium Swallow Study  Patient Details  Name: Allison Caldwell MRN: [id number] Date of Birth: 1946-08-24  Today's Date: 04/04/2023  Modified Barium Swallow completed.  Full report located under Chart Review in the Imaging Section.  History of Present Illness Allison Caldwell is a 77 y.o. female with PMH significant for DM2, HTN, morbid obesity who fell as she got out of her bed. She hit her head on the dresser. MRI Moderate-sized acute hemorrhagic right MCA infarct, small acute right ACA territory infarct, moderate chronic small vessel ischemic disease.   Clinical Impression Pt seen for MBS and was sleepier than normal due to sedation meds given earlier this morning. She exhibited mild oropharyngeal dysphagia with oral  phase marked by prolonged mastication, bolus formation and transit with solid. She demonstrated decreased timing resulting in penetration before full laryngeal closure achieved however material exited vestibule during the swallow spontaneously (PAS 2). There was decreased tongue base retraction resulting in min-mild tongue base, vallecular and pyriform sinus residue that pt sensed and independently swallowed to clear residuals. Esophageal scan did not reveal abnormalities. Will initiate a Dys 1 (puree) texture, thin liquids (cups or straw), pills crushed, full supervision with meals and continued ST for safety and ability to upgrade texture. Factors that may increase risk of adverse event in presence of aspiration [employer] 2021):    Swallow Evaluation Recommendations Recommendations: PO diet PO Diet Recommendation: Dysphagia 1 (Pureed);Thin liquids (Level 0) Liquid Administration via: Cup;Straw Medication Administration: Crushed with puree Supervision: Staff to assist with self-feeding;Full supervision/cueing for swallowing strategies Swallowing strategies  : Slow rate;Small bites/sips Postural changes: Position pt fully upright for meals Oral care  recommendations: Oral care BID (2x/day)      [name] 04/04/2023,1:29 PM

## 2023-04-04 NOTE — Progress Notes (Addendum)
Subjective:  No significant overnight events. Patient alert and feeling "the best she has ever felt" this morning. She denies experiencing any pain, though the SCDs are bothering her legs. She is hungry and provided a verbal list of foods she would like to eat. She emphasized having no issues with swallowing. Discussed plan for MBS today to assess swallowing. All questions and concerns where addressed.    Objective:  Vital signs in last 24 hours: Vitals:   04/04/23 0325 04/04/23 0500 04/04/23 0502 04/04/23 0747  BP: (!) 167/81  (!) 152/78 (!) 164/86  Pulse: 85   91  Resp:    14  Temp: 98.4 F (36.9 C)   98.4 F (36.9 C)  TempSrc: Oral   Oral  SpO2: 96%   98%  Weight:  126.3 kg    Height:       Physical Exam: Constitutional: Interactive, lying in bed. Appears comfortable and in no acute distress. Cardiovascular: Regular rate, irregularly irregular rhythm. No murmurs, rubs, or gallops. Normal radial pulses bilaterally.  Pulmonary: Normal respiratory effort. Lungs clear to auscultation anteriorly.    Abdominal: Soft. Non-distended. No tenderness. Normal bowel sounds.  Musculoskeletal:  No LE edema. Neurological: Alert and talkative. Eyes open throughout exam. Oriented to person, place, and time. Speech remains dysarthric but improving.  Patient does move left upper extremity spontaneously. Slight left facial droop. Spontaneously lifting and moving RUE and RLE.  Even though patient was oriented x 3 she did appear confused at times during our conversation. Skin: Warm and dry.     Assessment/Plan:  Principal Problem:   Hemorrhagic stroke (HCC) Active Problems:   Diabetes mellitus type 2 with complications (HCC)   Hyperlipidemia associated with type 2 diabetes mellitus (HCC)   Morbid obesity with BMI of 45.0-49.9, adult (HCC)   Hypertension associated with diabetes (HCC)   Hyponatremia   Hypomagnesemia   Hypokalemia   Pulmonary hypertension (HCC)   Focal seizure (HCC)    Paroxysmal atrial fibrillation (HCC)   Delirium   Dysphagia  Allison Caldwell is a 77 y.o. female with a past medical history of type 2 diabetes, HTN, morbid obesity  who presented with L sided weakness and admitted on 03/25/2023 for hemorrhagic transformation of R parietal stroke.  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 CT head with patchy hemorrhage centered in the right parietal lobe, favoring hemorrhagic infarct or contusion. CTA head and neck without large vessel occlusion. On 5/28 and 5/30, repeat CTs with unchanged size of hemorrhagic infarct and no new ICH. Etiology of stroke likely due to new onset paroxysmal atrial fibrillation. Will not start anticoagulation at this time given ICH. Will continue medically managing risk factors for secondary prevention as listed below. Patient is gradually progressing towards therapy goals. Will need intensive therapy follow-up at discharge. Consulted TOC for SNF placement. - Appreciate PT, OT, speech assistance - TOC consult for SNF placement  # Focal seizure from right brain  Depakote levels 57 -> 30 -> 41 over the past 3 days. Increased dose to PO Depakote 500 mg TID yesterday (6/3). Will continue monitoring levels and titrate dose as needed. Will also continue Keppra and Vimpat. - Depakote 500 mg TID - Keppra 1500 mg BID - Vimpat 200 mg BID - Repeat Depakote level tomorrow AM   # New onset PAF with RVR  Patient remains in A-fib with rates well-controlled between 65-102. Will  not start DOAC at this time given ICH, but can consider starting DOAC after 10 weeks since date of hemorraghic stroke. - Metoprolol tartrate 50 mg BID - Hold DOAC  # Hypertension  Blood pressure remains hypertensive with MAPs in 100-110s. Currently on losartan 100 mg daily and metoprolol tartrate 50 mg BID. Started amlodipine 5 mg daily with plans to  titrate as needed to goal BP <130/80. - Start amlodipine 5 mg daily - Losartan 100 mg daily - Metoprolol tartrate 50 mg BID  # Hyperlipidemia  Lipid panel on 5/26 with LDL 54, which is at goal of LDL < 70. Will continue home pravastatin 20 mg daily. - Pravastatin 20 mg daily  # Type 2 Diabetes A1c 8.8 on 03/25/2023, goal A1c < 7. Blood glucose ranging between 186-216 today. Will continue SSI while hospitalized. - SSI Q4h  # Agitated delirium Patient last received dose of haloperidol for agitation at 0330 on 6/3. Mental status improving when seen this morning. She is alert and oriented x 3, though seems to be hallucinating as she mentioned talking to sister-in-law and pointed to an empty chair in the room in reference to where she was located. Will continue delirium precautions as well as sertraline and quetiapine. - Sertraline 100 mg daily - Quetiapine 100 mg BID - Delirium precautions  # Dysphagia  Patient underwent modified barium swallow today with findings of mild oropharyngeal dysphagia. Speech therapy recommending puree diet, which has been started. Speech therapy will continue treatment while hospitalized. - Appreciate speech recommendations - Start puree diet  Diet: Dysphagia 1 Bowel: Senna VTE: Lovenox IVF: None Code: Full PT/OT recs: SNF LOS: day 10  Whitman Hero, Medical Student 04/04/2023, 8:32 AM On call pager: [phone]

## 2023-04-04 NOTE — Progress Notes (Signed)
Nutrition Follow-up  DOCUMENTATION CODES:  Morbid obesity  INTERVENTION:  Continue current diet per SLP Ensure Enlive po BID, each supplement provides 350 kcal and 20 grams of protein. Continue tube feeds via Cortrak. Adjust to the following: Glucerna 1.5 @ 70 ml/hr x 12 hours (840 ml/day) Free water 30mL q4h to maintain tube patency  Tube feeding regimen at goal rate provides 1260 kcal, 69 grams of protein, and 638 ml of H2O. ( TF+flush) Banatrol 1x/d for 5g additional fiber to support gut health and aid in loose stools 48-hour kcal count to determine if further adjustments in TF regimen are needed.   NUTRITION DIAGNOSIS:  Inadequate oral intake related to lethargy/confusion as evidenced by NPO status. - remains applicable  GOAL:  Patient will meet greater than or equal to 90% of their needs - progressing, being met with TF at goal  MONITOR:  Diet advancement, Labs, Weight trends, TF tolerance  REASON FOR ASSESSMENT:  Consult Enteral/tube feeding initiation and management  ASSESSMENT:  77 year old female who presented to the ED on 5/28 after a fall. PMH of T2DM, HTN, HLD. Pt admitted with ICH.  5/25 - diet advanced to Carb Modified 5/28 - concern for seizures, NPO, Cortrak placed 6/3 - SLP evaluation, NPO recommended but MBS planned for 6/4 6/4 - MBS, DYS1 with thin liquids  Pt being transported out of room at the time of visit for MBS. Discussed with SLP afterwards and pt able to have diet put into place. SLP reports that pt complaining of being full during assessments. Will adjust pt to nocturnal feeds and start kcal count to determine if pt needs to receive her full nutrition via PEG or if she will be able to have cortrak removed. Discussed with RN.  Also noted pt's CBGs are consistently elevated and that she has watery stool documented. Will adjust to a carb steady, fiber containing formula to aid in management of CBG and loose stools.  Average Meal  Intake: 5/25-5/26: 24% intake x 4 recorded meals (prior to NPO status)  Nutritionally Relevant Medications: Scheduled Meds:  PROSource TF20  60 mL Per Tube Daily   insulin aspart  0-20 Units Subcutaneous Q4H   polyethylene glycol  17 g Per Tube BID   pravastatin  20 mg Per Tube QHS   senna-docusate  2 tablet Per Tube BID   Continuous Infusions:  feeding supplement (OSMOLITE 1.5 CAL) 50 mL/hr at 04/04/23 0415   PRN Meds: ondansetron, polyethylene glycol  Labs Reviewed: Na 133, chloride 96 BUN 26 CBG ranges from 66-214 mg/dL over the last 24 hours HgbA1c 8.8%  NUTRITION - FOCUSED PHYSICAL EXAM: Flowsheet Row Most Recent Value  Orbital Region No depletion  Upper Arm Region No depletion  Thoracic and Lumbar Region No depletion  Buccal Region No depletion  Temple Region No depletion  Clavicle Bone Region No depletion  Clavicle and Acromion Bone Region Mild depletion  Scapular Bone Region Unable to assess  Dorsal Hand No depletion  Patellar Region No depletion  Anterior Thigh Region No depletion  Posterior Calf Region Moderate depletion  Edema (RD Assessment) Mild  Hair Reviewed  Eyes Reviewed  Mouth Reviewed  Skin Reviewed  Nails Reviewed    Diet Order:   Diet Order             DIET - DYS 1 Room service appropriate? No; Fluid consistency: Thin  Diet effective now                   EDUCATION  NEEDS:  Not appropriate for education at this time  Skin:  Skin Assessment: Reviewed RN Assessment  Last BM:  6/3 - type 7  Height:  Ht Readings from Last 1 Encounters:  03/25/23 5\' 4"  (1.626 m)    Weight:  Wt Readings from Last 1 Encounters:  04/04/23 126.3 kg    Ideal Body Weight:  54.5 kg  BMI:  Body mass index is 47.79 kg/m.  Estimated Nutritional Needs:  Kcal:  1600-1900 g/d Protein:  85-100g/d Fluid:  1.8-2L/d    Greig Castilla, RD, LDN Clinical Dietitian RD pager # available in [facility]  After hours/weekend pager # available in [facility]

## 2023-04-04 NOTE — Progress Notes (Signed)
Occupational Therapy Treatment Patient Details Name: Allison Caldwell MRN: [id number] DOB: [birth year] Today's Date: 04/04/2023   History of present illness [name] is a 77 y.o. female who presented after a fall out of bed, now has L sided weakness. 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 Witnessed seizures during admission. Undergoing LTM EEG. PMHx:  DM2, HTN, morbid obesity   OT comments  Pt progressing gradually towards OT goals, remains limited by attention deficits and hallucinations (seeing bugs, someone outside window) but able to progress to brief standing attempts with Mod A x 2 though unable to take steps. Pt able to use L UE functionally for UB bathing and showing some insight into task demands but continues to require extensive assist. Pending continued improvements, pt may benefit from intensive therapy follow up at DC.   Recommendations for follow up therapy are one component of a multi-disciplinary discharge planning process, led by the attending physician.  Recommendations may be updated based on patient status, additional functional criteria and insurance authorization.    Assistance Recommended at Discharge Frequent or constant Supervision/Assistance  Patient can return home with the following  Two people to help with walking and/or transfers;Two people to help with bathing/dressing/bathroom;Assistance with feeding;Assistance with cooking/housework;Direct supervision/assist for medications management;Direct supervision/assist for financial management;Assist for transportation;Help with stairs or ramp for entrance   Equipment Recommendations  Other (comment) (TBD pending progress)    Recommendations for Other Services Rehab consult    Precautions / Restrictions Precautions Precautions: Fall;Other (comment) Precaution Comments: L inattention, hemiparesis, R hand mitt, cortrak Restrictions Weight  Bearing Restrictions: No       Mobility Bed Mobility Overal bed mobility: Needs Assistance Bed Mobility: Supine to Sit, Sit to Supine, Rolling Rolling: Mod assist   Supine to sit: Max assist, +2 for physical assistance, +2 for safety/equipment, HOB elevated Sit to supine: Total assist, +2 for physical assistance, +2 for safety/equipment        Transfers Overall transfer level: Needs assistance Equipment used: 2 person hand held assist, Rolling walker (2 wheels), Ambulation equipment used Transfers: Sit to/from Stand Sit to Stand: Mod assist, +2 physical assistance, +2 safety/equipment           General transfer comment: unable to hold L hand on RW so opted for handheld support. initial instruction to stand unsuccesful but on second attempt, pt able to assist in lifting bottom and stand but L lateral lean noted with prolonged standing     Balance Overall balance assessment: Needs assistance Sitting-balance support: Feet supported Sitting balance-Leahy Scale: Fair   Postural control: Left lateral lean Standing balance support: Bilateral upper extremity supported, During functional activity Standing balance-Leahy Scale: Poor                             ADL either performed or assessed with clinical judgement   ADL Overall ADL's : Needs assistance/impaired         Upper Body Bathing: Maximal assistance;Sitting Upper Body Bathing Details (indicate cue type and reason): limited by impaired attention, but able to functionally hold wash cloth with both hands to reach and wash each arm (though not thoroughly), pt then reaching to hold up each breast automatically for assist with UB bathing Lower Body Bathing: Total assistance;Sitting/lateral leans;Bed level   Upper Body Dressing : Sitting;Moderate assistance  General ADL Comments: impacted by restlessness and poor attention to tasks today as well as some hallucinations     Extremity/Trunk Assessment Upper Extremity Assessment Upper Extremity Assessment: LUE deficits/detail LUE Deficits / Details: PROM WFL, does not follow commands for ROM assessment but able to use L UE for functional tasks (cued to reach over to R arm to wash it) LUE Sensation: decreased light touch;decreased proprioception LUE Coordination: decreased fine motor;decreased gross motor   Lower Extremity Assessment Lower Extremity Assessment: Defer to PT evaluation        Vision   Vision Assessment?: Vision impaired- to be further tested in functional context Additional Comments: difficult to fully assess due to cognition   Perception     Praxis      Cognition Arousal/Alertness: Awake/alert Behavior During Therapy: Flat affect, Restless, Impulsive Overall Cognitive Status: Impaired/Different from baseline Area of Impairment: Orientation, Attention, Memory, Following commands, Safety/judgement, Awareness, Problem solving                 Orientation Level: Disoriented to, Situation Current Attention Level: Sustained Memory: Decreased short-term memory, Decreased recall of precautions Following Commands: Follows one step commands inconsistently, Follows one step commands with increased time Safety/Judgement: Decreased awareness of safety, Decreased awareness of deficits Awareness: Intellectual Problem Solving: Slow processing, Decreased initiation, Difficulty sequencing, Requires verbal cues, Requires tactile cues General Comments: alert, restless and talkative throughout. pt with fair command following though poor attention to tasks. pt reports seeing bugs in room, reports a man looking at her through the window        Exercises      Shoulder Instructions       General Comments      Pertinent Vitals/ Pain       Pain Assessment Pain Assessment: Faces Faces Pain Scale: Hurts a little bit Pain Location: cortrak Pain Descriptors / Indicators: Sore Pain  Intervention(s): Monitored during session  Home Living                                          Prior Functioning/Environment              Frequency  Min 2X/week        Progress Toward Goals  OT Goals(current goals can now be found in the care plan section)  Progress towards OT goals: Progressing toward goals  Acute Rehab OT Goals Patient Stated Goal: run out the door OT Goal Formulation: With patient Time For Goal Achievement: 04/10/23 Potential to Achieve Goals: Fair ADL Goals Pt Will Perform Grooming: with min guard assist;sitting Pt Will Perform Upper Body Dressing: with min assist;sitting Pt Will Perform Lower Body Dressing: with max assist;sit to/from stand Pt Will Transfer to Toilet: with max assist;stand pivot transfer;bedside commode Additional ADL Goal #1: Pt will attend to L environment to locate ADL items with min cues as a precursor to self care  Plan Discharge plan remains appropriate    Co-evaluation                 AM-PAC OT "6 Clicks" Daily Activity     Outcome Measure   Help from another person eating meals?: Total (NPO) Help from another person taking care of personal grooming?: A Lot Help from another person toileting, which includes using toliet, bedpan, or urinal?: Total Help from another person bathing (including washing, rinsing, drying)?: A Lot Help from another person to put  on and taking off regular upper body clothing?: A Lot Help from another person to put on and taking off regular lower body clothing?: Total 6 Click Score: 9    End of Session    OT Visit Diagnosis: Unsteadiness on feet (R26.81);Other abnormalities of gait and mobility (R26.89);Muscle weakness (generalized) (M62.81);History of falling (Z91.81);Hemiplegia and hemiparesis Hemiplegia - Right/Left: Left Hemiplegia - dominant/non-dominant: Non-Dominant Hemiplegia - caused by: Cerebral infarction   Activity Tolerance Patient tolerated treatment  well;Other (comment) (limited by cognition)   Patient Left in bed;with call bell/phone within reach;with bed alarm set;Other (comment);with nursing/sitter in room (R mitt on)   Nurse Communication          Time: [phone] OT Time Calculation (min): 23 min  Charges: OT General Charges $OT Visit: 1 Visit OT Treatments $Self Care/Home Management : 8-22 mins  Bradd Canary, OTR/L Acute Rehab Services Office: [phone]   Lorre Munroe 04/04/2023, 10:32 AM

## 2023-04-05 DIAGNOSIS — I10 Essential (primary) hypertension: Secondary | ICD-10-CM | POA: Diagnosis not present

## 2023-04-05 DIAGNOSIS — R569 Unspecified convulsions: Secondary | ICD-10-CM | POA: Diagnosis not present

## 2023-04-05 DIAGNOSIS — I480 Paroxysmal atrial fibrillation: Secondary | ICD-10-CM | POA: Diagnosis not present

## 2023-04-05 DIAGNOSIS — I619 Nontraumatic intracerebral hemorrhage, unspecified: Secondary | ICD-10-CM | POA: Diagnosis not present

## 2023-04-05 LAB — BASIC METABOLIC PANEL
Anion gap: 11 (ref 5–15)
BUN: 20 mg/dL (ref 8–23)
CO2: 28 mmol/L (ref 22–32)
Calcium: 9.3 mg/dL (ref 8.9–10.3)
Chloride: 98 mmol/L (ref 98–111)
Creatinine, Ser: 0.62 mg/dL (ref 0.44–1.00)
GFR, Estimated: >60 mL/min (ref >60)
Glucose, Bld: 143 mg/dL — ABNORMAL HIGH (ref 70–99)
Potassium: 4 mmol/L (ref 3.5–5.1)
Sodium: 137 mmol/L (ref 135–145)

## 2023-04-05 LAB — GLUCOSE, CAPILLARY
Glucose-Capillary: 94 mg/dL (ref 70–99)
Glucose-Capillary: 129 mg/dL — ABNORMAL HIGH (ref 70–99)
Glucose-Capillary: 216 mg/dL — ABNORMAL HIGH (ref 70–99)
Glucose-Capillary: 175 mg/dL — ABNORMAL HIGH (ref 70–99)
Glucose-Capillary: 148 mg/dL — ABNORMAL HIGH (ref 70–99)
Glucose-Capillary: 92 mg/dL (ref 70–99)

## 2023-04-05 LAB — CBC
HCT: 39.7 % (ref 36.0–46.0)
Hemoglobin: 12.8 g/dL (ref 12.0–15.0)
MCH: 29.6 pg (ref 26.0–34.0)
MCHC: 32.2 g/dL (ref 30.0–36.0)
MCV: 91.7 fL (ref 80.0–100.0)
Platelets: 274 10*3/uL (ref 150–400)
RBC: 4.33 MIL/uL (ref 3.87–5.11)
RDW: 16 % — ABNORMAL HIGH (ref 11.5–15.5)
WBC: 7.3 10*3/uL (ref 4.0–10.5)
nRBC: 0 % (ref 0.0–0.2)

## 2023-04-05 LAB — VALPROIC ACID LEVEL: Valproic Acid Lvl: 48 ug/mL — ABNORMAL LOW (ref 50.0–100.0)

## 2023-04-05 MED ORDER — QUETIAPINE FUMARATE 50 MG PO TABS
50.0000 mg | ORAL_TABLET | Freq: Every day | ORAL | Status: DC
  Administered 2023-04-06 – 2023-04-10 (×5): 50 mg
  Filled 2023-04-05 (×5): qty 1

## 2023-04-05 MED ORDER — GLUCERNA 1.5 CAL PO LIQD
1000.0000 mL | ORAL | Status: DC
  Administered 2023-04-05 – 2023-04-06 (×2): 1000 mL
  Filled 2023-04-05 (×5): qty 1000

## 2023-04-05 NOTE — Progress Notes (Signed)
Subjective:  No significant overnight events. Patient upset this morning because she thinks someone removed her earrings and necklace while she was sleeping. (RN confirmed her son took them home.) She is otherwise feeling well. She was able to eat some ice cream yesterday. She requested coffee. She would like to leave the hospital soon. Discussed plan to continue working with PT, OT, ST while we work on SNF placement. All questions and concerns were addressed.   Objective:  Vital signs in last 24 hours: Vitals:   04/04/23 2335 04/05/23 0416 04/05/23 0500 04/05/23 0600  BP: 97/67 (!) 177/92  (!) 156/81  Pulse: 68 74    Resp: 19 (!) 21    Temp: 97.9 F (36.6 C) 97.7 F (36.5 C)    TempSrc: Oral     SpO2: 99% 100%    Weight:   126.3 kg   Height:       Physical Exam: Constitutional: Interactive, lying in bed. Appears comfortable and in no acute distress, though concerned about her necklace. Cardiovascular: Regular rate, regular rhythm. No murmurs, rubs, or gallops. Normal radial pulses bilaterally.  Pulmonary: Normal respiratory effort. Lungs clear to auscultation anteriorly.    Abdominal: Soft. Non-distended. No tenderness. Normal bowel sounds.  Musculoskeletal:  No LE edema. Neurological: Alert and talkative. Eyes open throughout exam. Slight left facial droop. Speech is intelligible. Will lift RUE and LUE to command and squeeze hand, though uses right hand for most activities. Able to wiggle toes, move both feet into dorsiflexion and plantarflexion. Will lift RLE and LLE to command. Bilateral LE sensation intact. Skin: Warm and dry.   Assessment/Plan:  Principal Problem:   Hemorrhagic stroke (HCC) Active Problems:   Diabetes mellitus type 2 with complications (HCC)   Hyperlipidemia associated with type 2 diabetes mellitus (HCC)   Morbid obesity with BMI of 45.0-49.9, adult (HCC)   Hypertension associated with diabetes (HCC)   Hyponatremia   Hypomagnesemia   Hypokalemia    Pulmonary hypertension (HCC)   Focal seizure (HCC)   Paroxysmal atrial fibrillation (HCC)   Delirium   Dysphagia  Allison Caldwell is a 77 y.o. female with a past medical history of type 2 diabetes, HTN, morbid obesity  who presented with L sided weakness and admitted on 03/25/2023 for hemorrhagic transformation of R parietal stroke.   # Acute embolic stroke of right MCA/ACA artery with hemorrhagic transformation  # Dysphagia On admission (5/25), MRI with moderate right MCA and small right ACA infarcts. CT head with patchy hemorrhage centered in the right parietal lobe, favoring hemorrhagic infarct or contusion. CTA head and neck without large vessel occlusion. On 5/28 and 5/30, repeat CTs with unchanged size of hemorrhagic infarct and no new ICH. Etiology of stroke likely due to new onset paroxysmal atrial fibrillation. Will not start anticoagulation at this time given ICH. Will continue medically managing risk factors for secondary prevention as listed below. Patient is gradually progressing towards therapy goals. Advanced to dysphagia 2 diet today. Will need intensive therapy follow-up at discharge. Consulted TOC for SNF placement. - Appreciate PT, OT, speech assistance - TOC consult for SNF placement - Dysphagia 2 diet   # Focal seizure from right brain  Depakote levels 57 -> 30 -> 41 -> 48 over the past 4 days. Increased dose to PO Depakote 500 mg TID on 6/3. Will continue current Depakote dose since levels are increasing, and patient remains seizure-free. Will also continue Keppra and Vimpat. - Depakote 500 mg TID - Keppra 1500 mg BID -  Vimpat 200 mg BID   # New onset PAF with RVR  Patient in sinus rhythm when seen this morning with rates 66-91 overnight. Will not start DOAC at this time given ICH, but can consider starting DOAC after 10 weeks since date of hemorraghic stroke. - Metoprolol tartrate 50 mg BID - Hold DOAC   # Hypertension  Blood pressure remains hypertensive with  MAPs in 100-110s. Currently on losartan 100 mg daily and metoprolol tartrate 50 mg BID. Will increase to amlodipine 10 mg daily with goal BP <130/80. - Amlodipine 10 mg daily - Losartan 100 mg daily - Metoprolol tartrate 50 mg BID   # Hyperlipidemia  Lipid panel on 5/26 with LDL 54, which is at goal of LDL < 70. Will continue home pravastatin 20 mg daily. - Pravastatin 20 mg daily   # Type 2 Diabetes A1c 8.8 on 03/25/2023, goal A1c < 7. Blood glucose ranging between 94-143 today. Will continue SSI while hospitalized. - SSI Q4h   # Agitated delirium Patient last received dose of haloperidol for agitation at 0330 on 6/3. Mental status improving when seen this morning. Speech was coherent with no apparent confusion or hallucinations. Will continue delirium precautions as well as sertraline and quetiapine. - Sertraline 100 mg daily - Quetiapine 100 mg BID - Delirium precautions   Diet: Dysphagia 2 Bowel: PRN Miralax VTE: Lovenox IVF: None Code: Full PT/OT recs: SNF LOS: day 11  Whitman Hero, Medical Student 04/05/2023, 6:37 AM On call pager: [phone]

## 2023-04-05 NOTE — Progress Notes (Addendum)
Speech Language Pathology Treatment: Dysphagia;Cognitive-Linquistic  Patient Details Name: Allison Caldwell MRN: [id number] DOB: 1946/02/29 Today's Date: 04/05/2023 Time: 4098-1191 SLP Time Calculation (min) (ACUTE ONLY): 15 min  Assessment / Plan / Recommendation Clinical Impression  Pt more alert this am and seen for dysphagia and cognitive intervention. MBS yesterday recommended puree/thin primarily given her lethargic state following sedative medication. Today she was ale to feed self with right hand (left hand dominant) and observed with higher texture and able to masticate adequately without significant residue. No s/s aspiration with straw sips thin or solids. Discussed texture with pt and will upgrade to next level Dys 2 (minced) with cues to masticate thoroughly and check left side mouth for potential pocketed food. Pt's sustained attention was improved and able to self  She needed some assist to problem solve for self feeding with non dominant right hand. She was oriented to place, situation (stroke), month and date. She was able to demonstrate calling for help if needed with call bell. Her speech is 100% intelligible and she has met her dysarthria goal. ST to continue.   HPI HPI: Allison Caldwell is a 77 y.o. female with PMH significant for DM2, HTN, morbid obesity who fell as she got out of her bed. She hit her head on the dresser. MRI Moderate-sized acute hemorrhagic right MCA infarct, small acute right ACA territory infarct, moderate chronic small vessel ischemic disease.      SLP Plan  Continue with current plan of care      Recommendations for follow up therapy are one component of a multi-disciplinary discharge planning process, led by the attending physician.  Recommendations may be updated based on patient status, additional functional criteria and insurance authorization.    Recommendations  Diet recommendations: Dysphagia 2 (fine chop);Thin liquid Liquids provided  via: Straw;Cup Medication Administration: Whole meds with puree Supervision: Patient able to self feed (needs set up) Compensations: Slow rate;Small sips/bites Postural Changes and/or Swallow Maneuvers: Seated upright 90 degrees                  Oral care BID   Frequent or constant Supervision/Assistance Dysphagia, unspecified (R13.10);Cognitive communication deficit (R41.841)     Continue with current plan of care     [name]  04/05/2023, 9:22 AM

## 2023-04-05 NOTE — TOC Initial Note (Signed)
Transition of Care [facility]) - Initial/Assessment Note    Patient Details  Name: Allison Caldwell MRN: [id number] Date of Birth: 1946/03/04  Transition of Care [facility]) CM/SW Contact:    Allison Lenis, LCSW Phone Number: 04/05/2023, 1:44 PM  Clinical Narrative:      Per chart review, patient is oriented, but forgetful; medical team suggested discussing disposition with family instead of patient. CSW contacted patient's son, Allison Caldwell, and left a Engineer, technical sales. Deondre called back and CSW discussed recommendation for SNF placement. CSW answered questions about SNF options, will email the son CMS choice list, per his request. Son also asked questions about home health after SNF, and CSW answered questions. CSW completed referral and faxed out, awaiting bed offers.           Expected Discharge Plan: Skilled Nursing Facility Barriers to Discharge: Continued Medical Work up, English as a second language teacher   Patient Goals and CMS Choice Patient states their goals for this hospitalization and ongoing recovery are:: get better CMS Medicare.gov Compare Post Acute Care list provided to:: Patient Represenative (must comment) Choice offered to / list presented to : Adult Children [place] ownership interest in [facility].provided to:: Adult Children    Expected Discharge Plan and Services     Post Acute Care Choice: Skilled Nursing Facility Living arrangements for the past 2 months: Apartment                                      Prior Living Arrangements/Services Living arrangements for the past 2 months: Apartment Lives with:: Self Patient language and need for interpreter reviewed:: No Do you feel safe going back to the place where you live?: Yes      Need for Family Participation in Patient Care: Yes (Comment) Care giver support system in place?: No (comment)   Criminal Activity/Legal Involvement Pertinent to Current Situation/Hospitalization: No - Comment as  needed  Activities of Daily Living Home Assistive Devices/Equipment: Walker (specify type), Cane (specify quad or straight) (front wheeled walker/ straight cane) ADL Screening (condition at time of admission) Patient's cognitive ability adequate to safely complete daily activities?: Yes Is the patient deaf or have difficulty hearing?: No Does the patient have difficulty seeing, even when wearing glasses/contacts?: Yes Does the patient have difficulty concentrating, remembering, or making decisions?: No Patient able to express need for assistance with ADLs?: Yes Does the patient have difficulty dressing or bathing?: Yes Independently performs ADLs?: No Communication: Independent Dressing (OT): Needs assistance Is this a change from baseline?: Change from baseline, expected to last >3 days Grooming: Needs assistance Is this a change from baseline?: Change from baseline, expected to last >3 days Feeding: Independent Bathing: Needs assistance Is this a change from baseline?: Change from baseline, expected to last >3 days Toileting: Needs assistance Is this a change from baseline?: Change from baseline, expected to last >3days In/Out Bed: Needs assistance Is this a change from baseline?: Change from baseline, expected to last >3 days Walks in Home: Needs assistance Is this a change from baseline?: Change from baseline, expected to last >3 days Does the patient have difficulty walking or climbing stairs?: Yes Weakness of Legs: Both Weakness of Arms/Hands: Both  Permission Sought/Granted Permission sought to share information with : Facility Medical sales representative, Family Supports Permission granted to share information with : Yes, Verbal Permission Granted  Share Information with NAME: Deondre  Permission granted to share info w AGENCY: SNF  Permission granted to share info w Relationship: Son     Emotional Assessment Appearance:: Appears stated age Attitude/Demeanor/Rapport:  Engaged Affect (typically observed): Appropriate Orientation: : Oriented to Self, Oriented to Place, Oriented to  Time, Oriented to Situation, Fluctuating Orientation (Suspected and/or reported Sundowners) Alcohol / Substance Use: Not Applicable Psych Involvement: No (comment)  Admission diagnosis:  Hemorrhagic stroke [facility]) [I61.9] Patient Active Problem List   Diagnosis Date Noted   Focal seizure (HCC) 04/03/2023   Paroxysmal atrial fibrillation (HCC) 04/03/2023   Delirium 04/03/2023   Dysphagia 04/03/2023   Hemorrhagic stroke (HCC) 03/25/2023   Acute hypoxic respiratory failure (HCC) 09/23/2022   Acute bronchitis due to respiratory syncytial virus (RSV) 09/23/2022   Pulmonary hypertension (HCC) 09/23/2022   Hypokalemia 05/07/2022   Bilateral hearing loss due to cerumen impaction 05/27/2021   Peeling skin 05/27/2021   Systolic murmur 09/28/2020   Benign paroxysmal positional vertigo 04/05/2018   Elevated rheumatoid factor 10/16/2016   Migratory pain 10/10/2016   Vitiligo 05/05/2016   Hypomagnesemia 04/29/2014   Hyponatremia 11/20/2011    Class: Acute   Hypertension associated with diabetes (HCC) 05/12/2010   ANXIETY DEPRESSION 04/06/2010   GLAUCOMA 01/20/2009   Hyperlipidemia associated with type 2 diabetes mellitus (HCC) 11/20/2008   Diabetes mellitus type 2 with complications (HCC) 12/27/2006   Morbid obesity with BMI of 45.0-49.9, adult (HCC) 12/27/2006   PCP:  Belva Agee, MD Pharmacy:   [id number] - [address] ([address]), [place] - [address] [place] ([place]) [place] [address] Phone: [phone] Fax: [phone]  [facility] - [place], [place] - [address] [place] [address] Phone: [phone] Fax: [phone]  [facility] [address] [place] [address] Phone: [phone] Fax: [phone]     Social Determinants of Health  (SDOH) Social History: SDOH Screenings   Food Insecurity: No Food Insecurity (03/25/2023)  Housing: Low Risk  (03/25/2023)  Transportation Needs: No Transportation Needs (03/25/2023)  Utilities: Not At Risk (03/25/2023)  Depression (PHQ2-9): Medium Risk (06/29/2021)  Tobacco Use: Medium Risk (04/03/2023)   SDOH Interventions:     Readmission Risk Interventions     No data to display

## 2023-04-05 NOTE — Progress Notes (Signed)
Nutrition Brief Note:   Calorie count ordered by RD on 6/4. No calorie count to collect at this time. RD will hang folder on the door. RD will collect Day 1 results tomorrow. RN reports that the pt reports "feeling full" today from tube feeds. RD will decrease the time that the tube feed is administered.   Modify EN regimen to:  Glucerna 1.5 @ 70 ml/hr x 10 hours (700 ml/day) Free water 30mL q4h to maintain tube patency  Tube feeding regimen at goal rate provides 1050 kcal, 57 grams of protein, and 711 ml of H2O. (TF+flush)   Johnelle Tafolla Bingham Bing, RD, LDN, CNSC.

## 2023-04-05 NOTE — Progress Notes (Addendum)
Physical Therapy Treatment Patient Details Name: Allison Caldwell MRN: [id number] DOB: Mar 25, 1946 Today's Date: 04/05/2023   History of Present Illness Allison Caldwell is a 77 y.o. female who presented after a fall out of bed, now has L sided weakness. 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 PMHx:  DM2, HTN, morbid obesity    PT Comments    Pt with extreme lethargy today but able to arouse to max verbal stimulus, status improves throughout session. Pt w reduced initiation for EOB dangle requiring max verbal and tactile cues for bed rail grasp and BLE navigation EOB, max A x2 person. Pt w L lateral lean in seated, corrected through max tactile facilitation from PT and pt only able to maintain for a few seconds. Pt w intermittent mod A to maintain upright seated position but generally min G. Sit<>stand not attempted today due to pt's drowsiness. Pt maintains seated EOB for ~10 minutes before returning supine w max A x2 person for BLE navigation and repositioning. Pt will continue to benefit from skilled physical therapy during their acute admission to progress their functional mobility status, balance, and strength.   Recommendations for follow up therapy are one component of a multi-disciplinary discharge planning process, led by the attending physician.  Recommendations may be updated based on patient status, additional functional criteria and insurance authorization.  Follow Up Recommendations  Can patient physically be transported by private vehicle: No    Assistance Recommended at Discharge Frequent or constant Supervision/Assistance  Patient can return home with the following Two people to help with walking and/or transfers;Two people to help with bathing/dressing/bathroom;Assistance with cooking/housework;Assist for transportation;Help with stairs or ramp for entrance   Equipment Recommendations  Other (comment)     Recommendations for Other Services       Precautions / Restrictions Precautions Precautions: Fall Precaution Comments: L inattention, hemiparesis, cortrak Restrictions Weight Bearing Restrictions: No     Mobility  Bed Mobility Overal bed mobility: Needs Assistance Bed Mobility: Supine to Sit, Sit to Supine     Supine to sit: Max assist, +2 for physical assistance Sit to supine: Max assist, +2 for physical assistance   General bed mobility comments: Lacking initation, requires max tactile cueing to grasp bed rail, no navigation of BLE EOB, does initiate some pushing through L elbow to achieve upright positioning but w max A x2. Requires repositioning of hips.    Transfers                        Ambulation/Gait                   Stairs             Wheelchair Mobility    Modified Rankin (Stroke Patients Only) Modified Rankin (Stroke Patients Only) Pre-Morbid Rankin Score: Slight disability Modified Rankin: Severe disability     Balance Overall balance assessment: Needs assistance Sitting-balance support: Feet supported Sitting balance-Leahy Scale: Fair Sitting balance - Comments:  (L lateral lean, corrected through tactile cueing but pt unable to maintain independently for longer than a few seconds. Pt with intermittent UE support, can place her hands on her knees with max verbal cues. Ranges from min guard to mod A.) Postural control: Left lateral lean     Standing balance comment: Standing not attempted today due to pt's current lethargy and difficulty to maintain eyes open.  Cognition Arousal/Alertness: Lethargic Behavior During Therapy: WFL for tasks assessed/performed Overall Cognitive Status: Impaired/Different from baseline Area of Impairment: Orientation, Attention, Following commands, Safety/judgement, Awareness, Problem solving                 Orientation Level: Disoriented to, Time,  Situation Current Attention Level: Sustained Memory: Decreased short-term memory, Decreased recall of precautions Following Commands: Follows one step commands inconsistently, Follows one step commands with increased time Safety/Judgement: Decreased awareness of safety, Decreased awareness of deficits Awareness: Intellectual Problem Solving: Slow processing, Decreased initiation, Difficulty sequencing, Requires verbal cues, Requires tactile cues General Comments: Continued L inattention, requires max verbal/tactile cueing for tasks, rambling communication, doesn't know why she isn't allowed out of bed.        Exercises General Exercises - Lower Extremity Long Arc Quad: AROM, 10 reps, Seated Other Exercises Other Exercises: R lateral leans onto elbow x 3 Other Exercises: Pt performed seated exercises to facilitate R attention and BUE ROM/strength.    General Comments        Pertinent Vitals/Pain Pain Assessment Pain Assessment: No/denies pain    Home Living                          Prior Function            PT Goals (current goals can now be found in the care plan section) Acute Rehab PT Goals Patient Stated Goal: i wanna sit in that chair PT Goal Formulation: With patient Time For Goal Achievement: 04/11/23 Potential to Achieve Goals: Fair Progress towards PT goals: Progressing toward goals    Frequency    Min 3X/week      PT Plan Current plan remains appropriate    Co-evaluation              AM-PAC PT "6 Clicks" Mobility   Outcome Measure  Help needed turning from your back to your side while in a flat bed without using bedrails?: Total Help needed moving from lying on your back to sitting on the side of a flat bed without using bedrails?: Total Help needed moving to and from a bed to a chair (including a wheelchair)?: Total Help needed standing up from a chair using your arms (e.g., wheelchair or bedside chair)?: Total Help needed to walk  in hospital room?: Total Help needed climbing 3-5 steps with a railing? : Total 6 Click Score: 6    End of Session   Activity Tolerance: Patient limited by lethargy Patient left: in bed;with call bell/phone within reach;with bed alarm set Nurse Communication: Mobility status PT Visit Diagnosis: Muscle weakness (generalized) (M62.81);Hemiplegia and hemiparesis;Other abnormalities of gait and mobility (R26.89) Hemiplegia - Right/Left: Left Hemiplegia - dominant/non-dominant: Non-dominant Hemiplegia - caused by: Cerebral infarction     Time: 1610-9604 PT Time Calculation (min) (ACUTE ONLY): 17 min  Charges:  $Therapeutic Activity: 8-22 mins                     Hendricks Milo, SPT  Acute Rehabilitation Services    Hendricks Milo 04/05/2023, 5:30 PM

## 2023-04-05 NOTE — NC FL2 (Signed)
[place] MEDICAID FL2 LEVEL OF CARE FORM     IDENTIFICATION  Patient Name: Allison Caldwell Birthdate: 10-01-1946 Sex: female Admission Date (Current Location): 03/25/2023  [facility] and [place] Number:  Producer, television/film/video and Address:  [facility], [address] [place], [place] [address]      Provider Number: [id number]  Attending Physician Name and Address:  Earl Lagos, MD  Relative Name and Phone Number:       Current Level of Care: Hospital Recommended Level of Care: Skilled Nursing Facility Prior Approval Number:    Date Approved/Denied:   PASRR Number: [id number]  Discharge Plan: SNF    Current Diagnoses: Patient Active Problem List   Diagnosis Date Noted   Focal seizure (HCC) 04/03/2023   Paroxysmal atrial fibrillation (HCC) 04/03/2023   Delirium 04/03/2023   Dysphagia 04/03/2023   Hemorrhagic stroke (HCC) 03/25/2023   Acute hypoxic respiratory failure (HCC) 09/23/2022   Acute bronchitis due to respiratory syncytial virus (RSV) 09/23/2022   Pulmonary hypertension (HCC) 09/23/2022   Hypokalemia 05/07/2022   Bilateral hearing loss due to cerumen impaction 05/27/2021   Peeling skin 05/27/2021   Systolic murmur 09/28/2020   Benign paroxysmal positional vertigo 04/05/2018   Elevated rheumatoid factor 10/16/2016   Migratory pain 10/10/2016   Vitiligo 05/05/2016   Hypomagnesemia 04/29/2014   Hyponatremia 11/20/2011   Hypertension associated with diabetes (HCC) 05/12/2010   ANXIETY DEPRESSION 04/06/2010   GLAUCOMA 01/20/2009   Hyperlipidemia associated with type 2 diabetes mellitus (HCC) 11/20/2008   Diabetes mellitus type 2 with complications (HCC) 12/27/2006   Morbid obesity with BMI of 45.0-49.9, adult (HCC) 12/27/2006    Orientation RESPIRATION BLADDER Height & Weight     Self, Time, Situation, Place  Normal Incontinent Weight: 278 lb 7.1 oz (126.3 kg) Height:  5\' 4"  (162.6 cm)  BEHAVIORAL SYMPTOMS/MOOD  NEUROLOGICAL BOWEL NUTRITION STATUS    Convulsions/Seizures Incontinent Diet (see DC summary)  AMBULATORY STATUS COMMUNICATION OF NEEDS Skin   Extensive Assist Verbally Normal                       Personal Care Assistance Level of Assistance  Bathing, Feeding, Dressing Bathing Assistance: Maximum assistance Feeding assistance: Maximum assistance Dressing Assistance: Maximum assistance     Functional Limitations Info  Sight Sight Info: Impaired        SPECIAL CARE FACTORS FREQUENCY  Speech therapy, PT (By licensed PT), OT (By licensed OT)     PT Frequency: 5x/wk OT Frequency: 5x/wk     Speech Therapy Frequency: 5x/wk      Contractures Contractures Info: Not present    Additional Factors Info  Code Status, Allergies, Psychotropic, Insulin Sliding Scale Code Status Info: Full Allergies Info: Ace Inhibitors, Penicillins Psychotropic Info: Seroquel 100mg  2x/day; Zoloft 100mg  daily Insulin Sliding Scale Info: see DC Summary       Current Medications (04/05/2023):  This is the current hospital active medication list Current Facility-Administered Medications  Medication Dose Route Frequency Provider Last Rate Last Admin   0.9 %  sodium chloride infusion   Intravenous PRN Erick Blinks, MD   Stopped at 03/25/23 1813   acetaminophen (TYLENOL) tablet 650 mg  650 mg Oral Q4H PRN Erick Blinks, MD   650 mg at 03/31/23 1654   Or   acetaminophen (TYLENOL) 160 MG/5ML solution 650 mg  650 mg Per Tube Q4H PRN Erick Blinks, MD   650 mg at 04/03/23 1728   Or   acetaminophen (TYLENOL)  suppository 650 mg  650 mg Rectal Q4H PRN Erick Blinks, MD       amLODipine (NORVASC) tablet 5 mg  5 mg Per Tube Daily Atway, Rayann N, DO   5 mg at 04/05/23 1035   butalbital-acetaminophen-caffeine (FIORICET) 50-325-40 MG per tablet 1 tablet  1 tablet Per Tube Q6H PRN Harolyn Rutherford, MD   1 tablet at 04/04/23 2131   enoxaparin (LOVENOX) injection 40 mg  40 mg Subcutaneous  Q24H Modena Slater, DO   40 mg at 04/04/23 2050   feeding supplement (ENSURE ENLIVE / ENSURE PLUS) liquid 237 mL  237 mL Oral BID BM Narendra, Nischal, MD   237 mL at 04/05/23 1035   feeding supplement (GLUCERNA 1.5 CAL) liquid 1,000 mL  1,000 mL Per Tube Q24H Narendra, Nischal, MD       fiber supplement (BANATROL TF) liquid 60 mL  60 mL Per Tube BID Heide Spark, Nischal, MD   60 mL at 04/05/23 1035   free water 30 mL  30 mL Per Tube Q4H Narendra, Nischal, MD   30 mL at 04/05/23 1209   guaiFENesin (ROBITUSSIN) 100 MG/5ML liquid 5 mL  5 mL Per Tube Q4H PRN Milon Dikes, MD   5 mL at 04/01/23 2140   haloperidol lactate (HALDOL) injection 5 mg  5 mg Intravenous Q6H PRN Marvel Plan, MD   5 mg at 04/03/23 0330   insulin aspart (novoLOG) injection 0-20 Units  0-20 Units Subcutaneous Q4H Marvel Plan, MD   7 Units at 04/05/23 1206   lacosamide (VIMPAT) tablet 200 mg  200 mg Per Tube BID Marvel Plan, MD   200 mg at 04/05/23 1034   levETIRAcetam (KEPPRA) tablet 1,500 mg  1,500 mg Per Tube BID Marvel Plan, MD   1,500 mg at 04/05/23 1034   losartan (COZAAR) tablet 100 mg  100 mg Per Tube Daily Marvel Plan, MD   100 mg at 04/05/23 1035   melatonin tablet 3 mg  3 mg Per Tube QHS Marvel Plan, MD   3 mg at 04/04/23 2122   metoprolol tartrate (LOPRESSOR) tablet 50 mg  50 mg Per Tube BID Marvel Plan, MD   50 mg at 04/05/23 1035   ondansetron (ZOFRAN) injection 4 mg  4 mg Intravenous Q8H PRN Harolyn Rutherford, MD   4 mg at 04/02/23 1748   Oral care mouth rinse  15 mL Mouth Rinse 4 times per day Milon Dikes, MD   15 mL at 04/05/23 1209   Oral care mouth rinse  15 mL Mouth Rinse PRN Milon Dikes, MD       pantoprazole (PROTONIX) injection 40 mg  40 mg Intravenous Q24H Marvel Plan, MD   40 mg at 04/04/23 1300   polyethylene glycol (MIRALAX / GLYCOLAX) packet 17 g  17 g Oral Daily PRN Marvel Plan, MD       pravastatin (PRAVACHOL) tablet 20 mg  20 mg Per Tube QHS Marvel Plan, MD   20 mg at 04/04/23 2122   QUEtiapine  (SEROQUEL) tablet 100 mg  100 mg Per Tube BID Harolyn Rutherford, MD   100 mg at 04/05/23 1034   sertraline (ZOLOFT) tablet 100 mg  100 mg Per Tube Daily Marvel Plan, MD   100 mg at 04/05/23 1035   valproic acid (DEPAKENE) 250 MG/5ML solution 500 mg  500 mg Oral Q8H Harolyn Rutherford, MD   500 mg at 04/05/23 0515   white petrolatum (VASELINE) gel   Topical PRN Milon Dikes, MD  Discharge Medications: Please see discharge summary for a list of discharge medications.  Relevant Imaging Results:  Relevant Lab Results:   Additional Information SS#: [id number]  Baldemar Lenis, [place]

## 2023-04-06 DIAGNOSIS — I480 Paroxysmal atrial fibrillation: Secondary | ICD-10-CM | POA: Diagnosis not present

## 2023-04-06 DIAGNOSIS — R569 Unspecified convulsions: Secondary | ICD-10-CM | POA: Diagnosis not present

## 2023-04-06 DIAGNOSIS — I619 Nontraumatic intracerebral hemorrhage, unspecified: Secondary | ICD-10-CM | POA: Diagnosis not present

## 2023-04-06 DIAGNOSIS — I10 Essential (primary) hypertension: Secondary | ICD-10-CM | POA: Diagnosis not present

## 2023-04-06 LAB — VALPROIC ACID LEVEL: Valproic Acid Lvl: 47 ug/mL — ABNORMAL LOW (ref 50.0–100.0)

## 2023-04-06 LAB — GLUCOSE, CAPILLARY
Glucose-Capillary: 110 mg/dL — ABNORMAL HIGH (ref 70–99)
Glucose-Capillary: 124 mg/dL — ABNORMAL HIGH (ref 70–99)
Glucose-Capillary: 133 mg/dL — ABNORMAL HIGH (ref 70–99)
Glucose-Capillary: 167 mg/dL — ABNORMAL HIGH (ref 70–99)
Glucose-Capillary: 167 mg/dL — ABNORMAL HIGH (ref 70–99)
Glucose-Capillary: 236 mg/dL — ABNORMAL HIGH (ref 70–99)

## 2023-04-06 MED ORDER — AMLODIPINE BESYLATE 10 MG PO TABS
10.0000 mg | ORAL_TABLET | Freq: Every day | ORAL | Status: DC
  Administered 2023-04-06 – 2023-04-10 (×5): 10 mg
  Filled 2023-04-06 (×5): qty 1

## 2023-04-06 MED ORDER — METOPROLOL TARTRATE 50 MG PO TABS
50.0000 mg | ORAL_TABLET | Freq: Two times a day (BID) | ORAL | Status: AC
  Administered 2023-04-06: 50 mg
  Filled 2023-04-06: qty 1

## 2023-04-06 MED ORDER — METOPROLOL SUCCINATE ER 100 MG PO TB24
100.0000 mg | ORAL_TABLET | Freq: Every day | ORAL | Status: DC
  Administered 2023-04-07 – 2023-04-10 (×4): 100 mg via ORAL
  Filled 2023-04-06 (×4): qty 1

## 2023-04-06 NOTE — Progress Notes (Signed)
Calorie Count Note  48-hour calorie count ordered.  Visited with pt in room to discuss intake as documentation from previous two meals indicates pt is taking in minimal food. Seems to be doing better with liquids. Pt interactive and alert, but has a hard time focusing on nutrition conversation. Preoccupied as she states she is worried about getting her phone bill paid today.   Pt unable to state the reason she is not able to eat more. Do not feel that it is a functional deficit at this point other than that pt will likely need feeding assistance. Pt was drinking an ensure which was mostly consumed at the time of assessment. Will add additional liquid supplements to tray.  Pt is not meeting her nutrition needs  Diet: DYS 2 Supplements: Ensure Enlive BID  Estimated Nutritional Needs:  Kcal:  1600-1900 g/d Protein:  85-100g/d Fluid:  1.8-2L/d  6/5 Dinner: 292 kcal, 8g protein 6/6 Breakfast: 60 kcal, 0 g protein 6/6 Lunch: No ticket available Supplements: 1 ensure plus high protien (350kcal, 20g protein)  Total intake: 700 kcal (44% of minimum estimated needs)  28 protein (33% of minimum estimated needs)   NUTRITION DIAGNOSIS:  Inadequate oral intake related to lethargy/confusion as evidenced by NPO status. - remains applicable   GOAL:  Patient will meet greater than or equal to 90% of their needs - progressing, being met with TF at goal  INTERVENTION:  Continue current diet per SLP Ensure Enlive po BID, each supplement provides 350 kcal and 20 grams of protein. Continue tube feeds via Cortrak. Adjust to the following: Glucerna 1.5 @ 70 ml/hr x 10 hours (700 ml/day) Free water 30mL q4h to maintain tube patency  Tube feeding regimen at goal rate provides 1050 kcal, 57 grams of protein, and 531 ml of H2O. ( TF+flush) Banatrol 1x/d for 5g additional fiber to support gut health and aid in loose stools Might[employer] Shakes on Tray TID to provide 330kcal and 9g protein 48-hour kcal  count to determine if further adjustments in TF regimen are needed.     Greig Castilla, RD, LDN Clinical Dietitian RD pager # available in [facility]  After hours/weekend pager # available in [facility]

## 2023-04-06 NOTE — Progress Notes (Addendum)
HD#12 Subjective:   Summary: This is a 77 year old female with a past medical history of hypertension, type 2 diabetes who presented initially for concerns of left-sided weakness.  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  Patient admitted for further evaluation and management.  Overnight Events: No overnight events  Patient sitting up in bed laughing and making jokes.  She denies any concerns this morning.  Patient is able to hold a conversation, sometimes needs some redirection.  Otherwise doing well.  Objective:  Vital signs in last 24 hours: Vitals:   04/06/23 0342 04/06/23 0500 04/06/23 0758 04/06/23 1214  BP: (!) 135/96  (!) 168/89 120/79  Pulse: 74  88 75  Resp: 18  18 18   Temp: 98.5 F (36.9 C)  98.5 F (36.9 C) 98.3 F (36.8 C)  TempSrc: Oral  Oral Oral  SpO2: 96%  98% 98%  Weight:  128 kg    Height:       Supplemental O2: Room Air SpO2: 98 %   Physical Exam:  Constitutional: Resting in, no acute distress. HENT: normocephalic atraumatic, mucous membranes moist Eyes: Able to track across the room. Cardiovascular: regular rate and rhythm, no m/r/g Pulmonary/Chest: normal work of breathing on room air, lungs clear to auscultation bilaterally Abdominal: soft, non-tender, non-distended Neurological: Grossly normal exam.  Patient did have some left-sided upper extremity weakness on fine motor movements such as grip strength and coordination.  No concerns with right upper extremity.  Bilateral lower extremities with 5/5 strength on hip flexion and plantarflexion as well as dorsiflexion.  Sensation intact.  Filed Weights   04/04/23 0500 04/05/23 0500 04/06/23 0500  Weight: 126.3 kg 126.3 kg 128 kg     Intake/Output Summary (Last 24 hours) at 04/06/2023 1351 Last data filed at 04/06/2023 0800 Gross per 24 hour  Intake 893.58 ml  Output 900 ml  Net -6.42 ml   Net IO Since Admission: -1,127.1 mL [04/06/23 1351]  Pertinent  Labs:    Latest Ref Rng & Units 04/05/2023    7:14 AM 04/04/2023    6:50 AM 04/03/2023    8:01 AM  CBC  WBC 4.0 - 10.5 K/uL 7.3  7.3  6.8   Hemoglobin 12.0 - 15.0 g/dL 16.1  09.6  04.5   Hematocrit 36.0 - 46.0 % 39.7  39.2  40.6   Platelets 150 - 400 K/uL 274  271  263        Latest Ref Rng & Units 04/05/2023    7:14 AM 04/04/2023    6:50 AM 04/03/2023    8:01 AM  CMP  Glucose 70 - 99 mg/dL 409  811  66   BUN 8 - 23 mg/dL 20  26  30    Creatinine 0.44 - 1.00 mg/dL 9.14  7.82  9.56   Sodium 135 - 145 mmol/L 137  133  138   Potassium 3.5 - 5.1 mmol/L 4.0  4.0  4.0   Chloride 98 - 111 mmol/L 98  96  99   CO2 22 - 32 mmol/L 28  27  28    Calcium 8.9 - 10.3 mg/dL 9.3  9.0  9.3     Imaging: No results found.  Assessment/Plan:   Principal Problem:   Hemorrhagic stroke [facility]) Active Problems:   Diabetes mellitus type 2 with complications (HCC)   Hyperlipidemia associated with type 2 diabetes mellitus (HCC)   Morbid obesity with BMI of 45.0-49.9, adult (HCC)   Hypertension associated with diabetes (  HCC)   Hyponatremia   Hypomagnesemia   Hypokalemia   Pulmonary hypertension (HCC)   Focal seizure (HCC)   Paroxysmal atrial fibrillation (HCC)   Delirium   Dysphagia   Patient Summary: This is a 77 year old female with a past medical history of hypertension, type 2 diabetes who presented initially for concerns of left-sided weakness.  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  Patient admitted for further evaluation and management.  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  Patient currently has core track in place, but is upgraded to a dysphagia 2 diet.  Patient is having some tube feeds as well as eating on her own.  Patient remains rate controlled on metoprolol.  Patient neurological status is improving.  She continues to work with PT/OT and is improving.  At this time, main control will  be to have strict blood pressure control.  Will also want patient to be transferred to SNF.  Patient is medically ready for SNF placement. -Continue working with PT/OT/speech -Continue to advance diet as tolerated -Plan to remove core track when ready -Pending SNF placement  #Focal seizure Seizures likely secondary from stroke above.  Patient has remained seizure-free.  Depakote levels are improving.  Will continue with current treatment. -Continue Depakote 500 mg 3 times daily -Continue Keppra 1500 mg twice daily -Continue Vimpat 200 mg twice daily -Continue to monitor Depakote levels  #New onset atrial fibrillation Patient is now rate controlled on metoprolol to tartrate 50 mg twice daily.  No acute concerns at this time.  Patient is regular rate and rhythm on my exam today.  At this time cannot anticoagulate.  Given patient has been rate controlled, will transition to metoprolol succinate 100 mg daily. -Continue to monitor -Transition from metoprolol to tartrate 50 mg twice daily to metoprolol succinate 100 mg daily  #Delirium Delirium is improving.  Patient is less sedated now.  Patient is more awake and able to follow commands.  Patient is easily directable. -Decreased quetiapine to 50 mg daily -Continue sertraline 100 mg daily -Delirium precautions placed  #Hypertension Blood pressure measuring well.  Continue current management -Continue amlodipine 10 mg daily -Continue losartan 100 mg daily -Transition to metoprolol succinate 100 mg daily  #Hyperlipidemia -Continue pravastatin 20 mg daily  #Type 2 diabetes -Continue sliding scale insulin  Diet: Dysphagia 2 diet IVF: None,None VTE: Enoxaparin Code: Full PT/OT recs: SNF for Subacute PT  Dispo: Anticipated discharge to Rehab in 1 days pending placement.   Modena Slater DO Internal Medicine Resident PGY-1 [phone] Please contact the on call pager after 5 pm and on weekends at [phone].

## 2023-04-06 NOTE — TOC Progression Note (Signed)
Transition of Care [facility]) - Progression Note    Patient Details  Name: Allison Caldwell MRN: [id number] Date of Birth: 26-Jan-1946  Transition of Care [facility]) CM/SW Contact  Baldemar Lenis, [place] Phone Number: 04/06/2023, 11:49 AM  Clinical Narrative:   CSW sent bed offers to patient's son, Madelaine Bhat, for review. CSW to follow.    Expected Discharge Plan: Skilled Nursing Facility Barriers to Discharge: Continued Medical Work up, English as a second language teacher  Expected Discharge Plan and Services     Post Acute Care Choice: Skilled Nursing Facility Living arrangements for the past 2 months: Apartment                                       Social Determinants of Health (SDOH) Interventions SDOH Screenings   Food Insecurity: No Food Insecurity (03/25/2023)  Housing: Low Risk  (03/25/2023)  Transportation Needs: No Transportation Needs (03/25/2023)  Utilities: Not At Risk (03/25/2023)  Depression (PHQ2-9): Medium Risk (06/29/2021)  Tobacco Use: Medium Risk (04/03/2023)    Readmission Risk Interventions     No data to display

## 2023-04-06 NOTE — Progress Notes (Signed)
Occupational Therapy Treatment Patient Details Name: Allison Caldwell MRN: [id number] DOB: June 26, 1946 Today's Date: 04/06/2023   History of present illness [name] is a 77 y.o. female who presented after a fall out of bed, now has L sided weakness. 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 PMHx:  DM2, HTN, morbid obesity   OT comments  Pt making gradual progress towards goals. Pt able to progress aspects of bed mobility to Mod A x 2 and brief standing in [place] with Min A x 2 (noted L lateral lean and reported dizziness). Pt with improving L UE movement though deficits in strength, coordination and proprioception still present; pt with decreased overall insight into deficits and benefits from cues to redirect to tasks. Continue to recommend postacute rehab stay as pt significantly below baseline.    Recommendations for follow up therapy are one component of a multi-disciplinary discharge planning process, led by the attending physician.  Recommendations may be updated based on patient status, additional functional criteria and insurance authorization.    Assistance Recommended at Discharge Frequent or constant Supervision/Assistance  Patient can return home with the following  Two people to help with walking and/or transfers;Two people to help with bathing/dressing/bathroom;Assistance with feeding;Assistance with cooking/housework;Direct supervision/assist for medications management;Direct supervision/assist for financial management;Assist for transportation;Help with stairs or ramp for entrance   Equipment Recommendations  Other (comment) (TBD)    Recommendations for Other Services      Precautions / Restrictions Precautions Precautions: Fall Precaution Comments: L inattention, hemiparesis, cortrak Restrictions Weight Bearing Restrictions: No       Mobility Bed Mobility Overal bed mobility: Needs Assistance Bed  Mobility: Supine to Sit, Sit to Supine     Supine to sit: Mod assist, +2 for physical assistance, +2 for safety/equipment, HOB elevated Sit to supine: Max assist, +2 for physical assistance, +2 for safety/equipment, HOB elevated   General bed mobility comments: Able to lift trunk and bring LE partially to EOB. assist to scoot fully to EOB. max a x 2 to lay back down due to dizziness and close to EOB    Transfers Overall transfer level: Needs assistance Equipment used: Ambulation equipment used Transfers: Sit to/from Stand Sit to Stand: Min assist, +2 physical assistance, +2 safety/equipment, From elevated surface           General transfer comment: Min A x 2 for initial stand from elevated bed into [place]. With prolonged standing, pt with increasing L lateral lean     Balance Overall balance assessment: Needs assistance Sitting-balance support: Feet supported Sitting balance-Leahy Scale: Fair Sitting balance - Comments: able to sit statically without L lateral lean today Postural control: Left lateral lean Standing balance support: Bilateral upper extremity supported, During functional activity Standing balance-Leahy Scale: Poor Standing balance comment: In [place] + up to ModA to correct L lateral lean in standing                           ADL either performed or assessed with clinical judgement   ADL Overall ADL's : Needs assistance/impaired                     Lower Body Dressing: Maximal assistance;Bed level Lower Body Dressing Details (indicate cue type and reason): able to bring LE to self and touch feet; difficulty attending to task and coordinating donning socks despite OT assisting over toes  General ADL Comments: Emphasis on EOB balance, problem solving ADLs    Extremity/Trunk Assessment Upper Extremity Assessment Upper Extremity Assessment: LUE deficits/detail LUE Deficits / Details: PROM WFL, 3+/5 grip strength. able to lift  this UE partially across abdomen but unable to reach up to head LUE Sensation: decreased light touch;decreased proprioception LUE Coordination: decreased fine motor;decreased gross motor   Lower Extremity Assessment Lower Extremity Assessment: Defer to PT evaluation        Vision   Vision Assessment?: Vision impaired- to be further tested in functional context Additional Comments: L inattention; difficulty tracking and attending to L side stimulation   Perception     Praxis      Cognition Arousal/Alertness: Awake/alert Behavior During Therapy: WFL for tasks assessed/performed, Impulsive Overall Cognitive Status: Impaired/Different from baseline Area of Impairment: Attention, Following commands, Safety/judgement, Awareness, Problem solving, Memory                   Current Attention Level: Sustained, Selective Memory: Decreased short-term memory, Decreased recall of precautions Following Commands: Follows one step commands consistently Safety/Judgement: Decreased awareness of safety, Decreased awareness of deficits Awareness: Intellectual Problem Solving: Slow processing, Decreased initiation, Difficulty sequencing, Requires verbal cues, Requires tactile cues General Comments: L inattention and impulsive at times; decreased insight into deficits - reports able to lift L UE and touch head but unaware that she is actually unable to physically do this based on observation. constant talking though typically in topic - able to redirect to tasks fairly easily; joking throughout. memory deficits as pt had forgotten having breakfast per nursing in room        Exercises      Shoulder Instructions       General Comments MD team entering during sesion with pharmacy students; nurse entering after that with meds changing course of session    Pertinent Vitals/ Pain       Pain Assessment Pain Assessment: Faces Faces Pain Scale: No hurt Pain Intervention(s): Monitored during  session  Home Living                                          Prior Functioning/Environment              Frequency  Min 2X/week        Progress Toward Goals  OT Goals(current goals can now be found in the care plan section)  Progress towards OT goals: Progressing toward goals  Acute Rehab OT Goals Patient Stated Goal: have some pig feet to eat OT Goal Formulation: With patient Time For Goal Achievement: 04/10/23 Potential to Achieve Goals: Fair ADL Goals Pt Will Perform Grooming: with min guard assist;sitting Pt Will Perform Upper Body Dressing: with min assist;sitting Pt Will Perform Lower Body Dressing: with max assist;sit to/from stand Pt Will Transfer to Toilet: with max assist;stand pivot transfer;bedside commode Additional ADL Goal #1: Pt will attend to L environment to locate ADL items with min cues as a precursor to self care  Plan Discharge plan needs to be updated    Co-evaluation                 AM-PAC OT "6 Clicks" Daily Activity     Outcome Measure   Help from another person eating meals?: A Lot Help from another person taking care of personal grooming?: A Lot Help from another person toileting, which includes using toliet,  bedpan, or urinal?: Total Help from another person bathing (including washing, rinsing, drying)?: A Lot Help from another person to put on and taking off regular upper body clothing?: A Lot Help from another person to put on and taking off regular lower body clothing?: Total 6 Click Score: 10    End of Session Equipment Utilized During Treatment: Gait belt  OT Visit Diagnosis: Unsteadiness on feet (R26.81);Other abnormalities of gait and mobility (R26.89);Muscle weakness (generalized) (M62.81);History of falling (Z91.81);Hemiplegia and hemiparesis Hemiplegia - Right/Left: Left Hemiplegia - dominant/non-dominant: Dominant Hemiplegia - caused by: Cerebral infarction   Activity Tolerance Patient tolerated  treatment well   Patient Left in bed;with call bell/phone within reach;with bed alarm set;with nursing/sitter in room;Other (comment) (R mitt on)   Nurse Communication Mobility status        Time: [phone] OT Time Calculation (min): 31 min  Charges: OT General Charges $OT Visit: 1 Visit OT Treatments $Self Care/Home Management : 8-22 mins $Therapeutic Activity: 8-22 mins  Bradd Canary, OTR/L Acute Rehab Services Office: [phone]   Lorre Munroe 04/06/2023, 11:20 AM

## 2023-04-06 NOTE — Consult Note (Signed)
   [facility] Inpatient Consult   04/06/2023  Milderd Ozanich 1946/07/22 [id number]  [facility] [[facility]]  Accountable Care Organization [ACO] Patient:  [employer] [SNP] in which the patient in this plan is followed by a [employer] SNP team  Primary Care Provider:  Belva Agee, MD with [facility] Internal Medicine is listed to provide the transition of care follow up.   Patient screened for hospitalization with noted medium risk score for unplanned readmission risk 12 day length of stay [includes ICU] and  to assess for potential [facility] [employer] [facility]  [[facility]] Care Management service needs for post hospital transition for care coordination.  Review of patient's electronic medical record reveals patient/family is currently pursing a skilled nursing facility level of care for transition from the hospital.  Plan:  Continue to follow progress and disposition to assess for post hospital community care coordination/management needs.  Referral request for community care coordination: pending disposition and needs followed with the [facility] SNP team.  Of note, [facility] Care Management/Population Health does not replace or interfere with any arrangements made by the Inpatient Transition of Care team.  For questions contact:   Charlesetta Shanks, RN BSN CCM [facility]  [phone] business mobile phone Toll free office [phone]  *Concierge Line  [phone] Fax number: [phone] [email or web address]

## 2023-04-07 DIAGNOSIS — I480 Paroxysmal atrial fibrillation: Secondary | ICD-10-CM | POA: Diagnosis not present

## 2023-04-07 DIAGNOSIS — R569 Unspecified convulsions: Secondary | ICD-10-CM | POA: Diagnosis not present

## 2023-04-07 DIAGNOSIS — I10 Essential (primary) hypertension: Secondary | ICD-10-CM | POA: Diagnosis not present

## 2023-04-07 DIAGNOSIS — I619 Nontraumatic intracerebral hemorrhage, unspecified: Secondary | ICD-10-CM | POA: Diagnosis not present

## 2023-04-07 LAB — VALPROIC ACID LEVEL: Valproic Acid Lvl: 54 ug/mL (ref 50.0–100.0)

## 2023-04-07 LAB — GLUCOSE, CAPILLARY
Glucose-Capillary: 134 mg/dL — ABNORMAL HIGH (ref 70–99)
Glucose-Capillary: 182 mg/dL — ABNORMAL HIGH (ref 70–99)
Glucose-Capillary: 285 mg/dL — ABNORMAL HIGH (ref 70–99)
Glucose-Capillary: 192 mg/dL — ABNORMAL HIGH (ref 70–99)
Glucose-Capillary: 117 mg/dL — ABNORMAL HIGH (ref 70–99)

## 2023-04-07 NOTE — Plan of Care (Signed)
  Problem: Education: Goal: Knowledge of disease or condition will improve Outcome: Progressing Goal: Knowledge of secondary prevention will improve (MUST DOCUMENT ALL) Outcome: Progressing Goal: Knowledge of patient specific risk factors will improve Allison Caldwell N/A or DELETE if not current risk factor) Outcome: Progressing   Problem: Intracerebral Hemorrhage Tissue Perfusion: Goal: Complications of Intracerebral Hemorrhage will be minimized Outcome: Progressing   Problem: Coping: Goal: Will verbalize positive feelings about self Outcome: Progressing Goal: Will identify appropriate support needs Outcome: Progressing   Problem: Health Behavior/Discharge Planning: Goal: Ability to manage health-related needs will improve Outcome: Progressing

## 2023-04-07 NOTE — Plan of Care (Signed)
  Problem: Education: Goal: Knowledge of disease or condition will improve Outcome: Progressing Goal: Knowledge of secondary prevention will improve (MUST DOCUMENT ALL) Outcome: Progressing Goal: Knowledge of patient specific risk factors will improve (Mark N/A or DELETE if not current risk factor) Outcome: Progressing   Problem: Intracerebral Hemorrhage Tissue Perfusion: Goal: Complications of Intracerebral Hemorrhage will be minimized Outcome: Progressing   Problem: Coping: Goal: Will verbalize positive feelings about self Outcome: Progressing Goal: Will identify appropriate support needs Outcome: Progressing   Problem: Health Behavior/Discharge Planning: Goal: Ability to manage health-related needs will improve Outcome: Progressing Goal: Goals will be collaboratively established with patient/family Outcome: Progressing   Problem: Self-Care: Goal: Ability to participate in self-care as condition permits will improve Outcome: Progressing Goal: Verbalization of feelings and concerns over difficulty with self-care will improve Outcome: Progressing Goal: Ability to communicate needs accurately will improve Outcome: Progressing   Problem: Nutrition: Goal: Risk of aspiration will decrease Outcome: Progressing Goal: Dietary intake will improve Outcome: Progressing   Problem: Education: Goal: Ability to describe self-care measures that may prevent or decrease complications (Diabetes Survival Skills Education) will improve Outcome: Progressing Goal: Individualized Educational Video(s) Outcome: Progressing   Problem: Coping: Goal: Ability to adjust to condition or change in health will improve Outcome: Progressing   Problem: Fluid Volume: Goal: Ability to maintain a balanced intake and output will improve Outcome: Progressing   Problem: Health Behavior/Discharge Planning: Goal: Ability to identify and utilize available resources and services will improve Outcome:  Progressing Goal: Ability to manage health-related needs will improve Outcome: Progressing   Problem: Metabolic: Goal: Ability to maintain appropriate glucose levels will improve Outcome: Progressing   Problem: Nutritional: Goal: Maintenance of adequate nutrition will improve Outcome: Progressing Goal: Progress toward achieving an optimal weight will improve Outcome: Progressing   Problem: Skin Integrity: Goal: Risk for impaired skin integrity will decrease Outcome: Progressing   Problem: Tissue Perfusion: Goal: Adequacy of tissue perfusion will improve Outcome: Progressing   Problem: Education: Goal: Knowledge of General Education information will improve Description: Including pain rating scale, medication(s)/side effects and non-pharmacologic comfort measures Outcome: Progressing   Problem: Health Behavior/Discharge Planning: Goal: Ability to manage health-related needs will improve Outcome: Progressing   Problem: Clinical Measurements: Goal: Ability to maintain clinical measurements within normal limits will improve Outcome: Progressing Goal: Will remain free from infection Outcome: Progressing Goal: Diagnostic test results will improve Outcome: Progressing Goal: Respiratory complications will improve Outcome: Progressing Goal: Cardiovascular complication will be avoided Outcome: Progressing   Problem: Activity: Goal: Risk for activity intolerance will decrease Outcome: Progressing   Problem: Nutrition: Goal: Adequate nutrition will be maintained Outcome: Progressing   Problem: Coping: Goal: Level of anxiety will decrease Outcome: Progressing   Problem: Elimination: Goal: Will not experience complications related to bowel motility Outcome: Progressing Goal: Will not experience complications related to urinary retention Outcome: Progressing   Problem: Pain Managment: Goal: General experience of comfort will improve Outcome: Progressing   Problem:  Safety: Goal: Ability to remain free from injury will improve Outcome: Progressing   Problem: Skin Integrity: Goal: Risk for impaired skin integrity will decrease Outcome: Progressing   

## 2023-04-07 NOTE — Progress Notes (Signed)
Calorie Count Note  48-hour calorie count ordered.  Pt resting in bed. Discussed intake this AM, but pt still a little distracted and unable to provide a great history. Intake is stable based on tickets from previous 24 hours. Pt is obtaining the majority of her nutrition via nutrition shakes. Do not anticipate that pt's intake will ever improve to meet her estimated needs this hospitalization. Approaching being discharge stable. Discussed with IM team, will likely dc cortrak today.  Diet: DYS 2 Supplements: Ensure Enlive BID  Estimated Nutritional Needs:  Kcal:  1600-1900 g/d Protein:  85-100g/d Fluid:  1.8-2L/d  6/6 Lunch: 339kcal, 17g protein 6/6 Dinner: 330 kcal, 9g protein 6/7 Breakfast: No ticket, 100% of Mighty shakes noted at bedside along with some food from [place] which was partially consumed Supplements: 1 ensure plus high protien (350kcal, 20g protein)  Total intake: 999 kcal (62% of minimum estimated needs)  65 protein (76% of minimum estimated needs)  NUTRITION DIAGNOSIS:  Inadequate oral intake related to lethargy/confusion as evidenced by NPO status. - remains applicable   GOAL:  Patient will meet greater than or equal to 90% of their needs - progressing, being met with TF at goal, some PO intake, likes supplements.   INTERVENTION:  Continue current diet per SLP Ensure Enlive po BID, each supplement provides 350 kcal and 20 grams of protein. Mighty Shakes on Tray TID to provide 330kcal and 9g protein Discontinue cortrak    Allison Caldwell, RD, LDN Clinical Dietitian RD pager # available in [facility]  After hours/weekend pager # available in [facility]

## 2023-04-07 NOTE — TOC Progression Note (Signed)
Transition of Care [facility]) - Progression Note    Patient Details  Name: Allison Caldwell MRN: [id number] Date of Birth: May 03, 1946  Transition of Care [facility]) CM/SW Contact  Baldemar Lenis, [place] Phone Number: 04/07/2023, 2:45 PM  Clinical Narrative:   CSW met with son, Madelaine Bhat, at bedside to discuss SNF options. Family has chosen [facility], [facility], or [facility] as their preferences. CSW contacted [facility] as referral is still pending; they reviewed and declined to offer a bed. [facility] has offered a bed, will have a bed available on Monday pending medical stability and insurance approval. CSW notified PT assigned for updated note, and requested CMA to start insurance authorization once note was entered. CSW contacted Deondre via phone to provide update, left a voicemail. CSW updated MD on barrier to discharge. CSW to follow.    Expected Discharge Plan: Skilled Nursing Facility Barriers to Discharge: Continued Medical Work up, English as a second language teacher  Expected Discharge Plan and Services     Post Acute Care Choice: Skilled Nursing Facility Living arrangements for the past 2 months: Apartment                                       Social Determinants of Health (SDOH) Interventions SDOH Screenings   Food Insecurity: No Food Insecurity (03/25/2023)  Housing: Low Risk  (03/25/2023)  Transportation Needs: No Transportation Needs (03/25/2023)  Utilities: Not At Risk (03/25/2023)  Depression (PHQ2-9): Medium Risk (06/29/2021)  Tobacco Use: Medium Risk (04/03/2023)    Readmission Risk Interventions     No data to display

## 2023-04-07 NOTE — Progress Notes (Signed)
Subjective:  No significant overnight events. Patient in good spirits this morning with son at bedside. She is ready to be discharged to SNF, though she is concerned about being placed far from [place]. Encouraged patient to increase PO intake today. Discussed plan for SNF placement pending insurance authorization.   Objective:  Vital signs in last 24 hours: Vitals:   04/07/23 0410 04/07/23 0732 04/07/23 0828 04/07/23 1231  BP: (!) 150/72  139/80 (!) 141/77  Pulse: 84  77 85  Resp: 17     Temp: 98.5 F (36.9 C)  100.2 F (37.9 C) 99.1 F (37.3 C)  TempSrc: Oral  Oral Oral  SpO2: 97%  98% 94%  Weight:  128 kg    Height:       Physical Exam: Constitutional: Resting comfortably in bed with son at bedside doing passive ROM exercises with left arm. No acute distress. Eyes: Able to track across the room. Peripheral vision diminished bilaterally. Cardiovascular: Regular rate, regular rhythm. No murmurs, rubs, or gallops. Pulmonary/Chest: Normal work of breathing on room air. Lungs clear to auscultation bilaterally. Abdominal: Soft, non-tender, non-distended. Normal bowel sounds. Neurological: Left-sided upper extremity weakness on fine motor movements such as grip strength and coordination. No concerns with right upper extremity. Bilateral LE with 5/5 strength on hip flexion, plantarflexion, and dorsiflexion. Sensation intact bilaterally.   Assessment/Plan:  Principal Problem:   Hemorrhagic stroke (HCC) Active Problems:   Diabetes mellitus type 2 with complications (HCC)   Hyperlipidemia associated with type 2 diabetes mellitus (HCC)   Morbid obesity with BMI of 45.0-49.9, adult (HCC)   Hypertension associated with diabetes (HCC)   Hyponatremia   Hypomagnesemia   Hypokalemia   Pulmonary hypertension (HCC)   Focal seizure (HCC)   Paroxysmal atrial fibrillation (HCC)   Delirium   Dysphagia   Allison Caldwell is a 77 year old female with a past medical history of  hypertension, type 2 diabetes who initially presented for concerns of left-sided weakness. Patient found to have acute right MCA/ACA infarct with hemorrhagic transformation and admitted on 5/25 for further evaluation and management.  #Acute embolic stroke of right MCA/ACA artery with hemorrhagic conversion Patient is progressing well with therapy. She remains on a dysphagia 2 diet but is consuming most of her calories through Ensures and Mighty Shakes. Cortrak discontinued today per RD given food intake is about at baseline. Patient is now medically ready for SNF placement. CSW will submit SNF insurance authorization today, though earliest placement will be Monday (6/10). Will continue medically managing risk factors for secondary prevention as outlined below.  - Continue working with PT/OT/speech - Continue to advance diet as tolerated - Pending SNF placement   #Focal seizure Seizures likely secondary from stroke above. Patient has remained seizure-free. Depakote levels are now within therapeutic range at 54. Will continue current treatment. -Continue Depakote 500 mg 3 times daily -Continue Keppra 1500 mg twice daily -Continue Vimpat 200 mg twice daily -Continue to monitor Depakote levels   #New onset atrial fibrillation Patient remains in sinus rhythm today. Will hold anticoagulation at this time given recent ICH. Will continue metoprolol succinate 100 mg daily. - Continue to monitor - Metoprolol succinate 100 mg daily   #Delirium Delirium is improving. Patient is alert and easily directable.  - Quetiapine 50 mg daily - Continue sertraline 100 mg daily - Delirium precautions placed   #Hypertension Blood pressure remains mildly hypertensive with MAPs in 90s. Will continue monitoring blood pressure since transitioned to metoprolol succinate 100 mg today. Will also  continue amlodipine and losartan. -Continue amlodipine 10 mg daily -Continue losartan 100 mg daily -Transition to metoprolol  succinate 100 mg daily   #Hyperlipidemia -Continue pravastatin 20 mg daily   #Type 2 diabetes -Continue sliding scale insulin  Diet: Dysphagia 2 diet IVF: None VTE: Enoxaparin Code: Full PT/OT recs: SNF for Subacute PT   Dispo: Anticipated discharge to Rehab on 04/10/2023.  Whitman Hero, Medical Student 04/07/2023, 1:56 PM On call pager: [phone]

## 2023-04-07 NOTE — Progress Notes (Signed)
Physical Therapy Treatment Patient Details Name: Allison Caldwell MRN: [id number] DOB: 31-May-1946 Today's Date: 04/07/2023   History of Present Illness Allison Caldwell is a 77 y.o. female who presented after a fall out of bed, now has L sided weakness. 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 PMHx:  DM2, HTN, morbid obesity    PT Comments    Pt is making gradual progress with functional mobility, needing modA to roll and maxAx1 to transition sidelying <> sit EOB today. She was able to correctly identify her name, DOB, location, that she had a stroke, and the current month and year. However, as she fatigued she became increasingly confused, talking about how she had been "walking around all morning" and got out of a car earlier and was going to go shopping later today. Her poor attention span also impacts her consistency with following cues and being aware of her poor sitting balance, often needing redirecting. After provided extensive multi-modal cues and education on finding and maintaining midline in sitting, she was able to progress her static sitting balance from modA to min guard-minA for ~45 sec periods before she would fatigue and have a posterior and L lateral LOB needing mod-maxA to recover. She may benefit from visual feedback from a mirror if she can attend to herself in the mirror for periods of time. Will continue to follow acutely.    Recommendations for follow up therapy are one component of a multi-disciplinary discharge planning process, led by the attending physician.  Recommendations may be updated based on patient status, additional functional criteria and insurance authorization.  Follow Up Recommendations  Can patient physically be transported by private vehicle: No    Assistance Recommended at Discharge Frequent or constant Supervision/Assistance  Patient can return home with the following Two people to help  with walking and/or transfers;Two people to help with bathing/dressing/bathroom;Assistance with cooking/housework;Assist for transportation;Help with stairs or ramp for entrance;Direct supervision/assist for financial management;Direct supervision/assist for medications management;Assistance with feeding   Equipment Recommendations  Other (comment) (defer to next venue of care)    Recommendations for Other Services       Precautions / Restrictions Precautions Precautions: Fall Precaution Comments: L inattention, hemiparesis Restrictions Weight Bearing Restrictions: No     Mobility  Bed Mobility Overal bed mobility: Needs Assistance Bed Mobility: Sit to Supine, Rolling, Sidelying to Sit Rolling: Mod assist Sidelying to sit: Max assist, HOB elevated   Sit to supine: Max assist, HOB elevated   General bed mobility comments: Pt needing verbal and tactile cues to bring L UE across chest and grab R bed rail with R hand to roll to R. Cues provided to bring each leg off R EOB step-by-step. MaxA to lift trunk to sit up.    Transfers Overall transfer level: Needs assistance                 General transfer comment: Cued pt to stand and also attempted to cue pt to scoot along EOB but pt kept repeating "I'm tired" and "I was up and walking earlier" (which likely is inaccurate as she has been needing extensive assist to stand while here so far). Pt did attempt a stand 1x but had L lateral LOB immediately and needed maxA to regain sitting balance.    Ambulation/Gait               General Gait Details: unable   Stairs  Wheelchair Mobility    Modified Rankin (Stroke Patients Only) Modified Rankin (Stroke Patients Only) Pre-Morbid Rankin Score: Slight disability Modified Rankin: Severe disability     Balance Overall balance assessment: Needs assistance Sitting-balance support: Feet supported Sitting balance-Leahy Scale: Poor Sitting balance -  Comments: Pt maintains a L lateral and posterior lean. Propped pt on R elbow and provided extensive multi-modal cues to flex trunk anteriorly. Eventually progressed pt from needing modA for sitting balance to min guard-minA to sit statically once pt was able to maintain her balance closer to midline. Pt would have LOB when she would fatigue after ~45 sec and need modA-maxA to recover again though Postural control: Left lateral lean, Posterior lean     Standing balance comment: unable at this time                            Cognition Arousal/Alertness: Awake/alert Behavior During Therapy: WFL for tasks assessed/performed Overall Cognitive Status: Impaired/Different from baseline Area of Impairment: Orientation, Attention, Following commands, Safety/judgement, Awareness, Problem solving, Memory                 Orientation Level: Disoriented to, Time, Situation Current Attention Level: Sustained Memory: Decreased short-term memory, Decreased recall of precautions Following Commands: Follows one step commands inconsistently, Follows one step commands with increased time Safety/Judgement: Decreased awareness of safety, Decreased awareness of deficits Awareness: Intellectual Problem Solving: Slow processing, Decreased initiation, Difficulty sequencing, Requires verbal cues, Requires tactile cues General Comments: Pt alaert and tangential in speech, often needing redirecting. Poor awareness of her L lean and her safety deficits and problem-solving how to fix her balance. Follows simple multi-modal cues ~40% of time once gained her attention. Pt oriented to month, year, self, location, and that she had a stroke. However, reported today was "Saturday", which is close, and reported she was "walking around all morning". Pt also stating she got out of a car this morning and is planning to go shopping, seeming to get more confused with these ideas as she fatigued, so while she can report the  correct location and situation at one point it does not seem to relate to her understanding of events of today.        Exercises General Exercises - Lower Extremity Long Arc Quad: AROM, 10 reps, Seated, Left Other Exercises Other Exercises: AAROM to "punch" L UE anteriorly below shoulder height, sitting EOB, 10x    General Comments        Pertinent Vitals/Pain Pain Assessment Pain Assessment: Faces Faces Pain Scale: Hurts a little bit Pain Location: itchy back Pain Descriptors / Indicators: Other (Comment) (itchy) Pain Intervention(s): Limited activity within patient's tolerance, Monitored during session, Repositioned, Other (comment) (gentle rubbing of itchy spots)    Home Living                          Prior Function            PT Goals (current goals can now be found in the care plan section) Acute Rehab PT Goals Patient Stated Goal: to get stronger PT Goal Formulation: With patient Time For Goal Achievement: 04/11/23 Potential to Achieve Goals: Fair Progress towards PT goals: Progressing toward goals    Frequency    Min 3X/week      PT Plan Current plan remains appropriate    Co-evaluation              AM-PAC  PT "6 Clicks" Mobility   Outcome Measure  Help needed turning from your back to your side while in a flat bed without using bedrails?: A Lot Help needed moving from lying on your back to sitting on the side of a flat bed without using bedrails?: A Lot Help needed moving to and from a bed to a chair (including a wheelchair)?: Total Help needed standing up from a chair using your arms (e.g., wheelchair or bedside chair)?: Total Help needed to walk in hospital room?: Total Help needed climbing 3-5 steps with a railing? : Total 6 Click Score: 8    End of Session   Activity Tolerance: Patient limited by fatigue Patient left: in bed;with call bell/phone within reach;with bed alarm set Nurse Communication: Other (comment) (NT - lunch  arrived to assist pt with eating) PT Visit Diagnosis: Muscle weakness (generalized) (M62.81);Hemiplegia and hemiparesis;Other abnormalities of gait and mobility (R26.89);Unsteadiness on feet (R26.81);Difficulty in walking, not elsewhere classified (R26.2);Other symptoms and signs involving the nervous system (R29.898) Hemiplegia - Right/Left: Left Hemiplegia - dominant/non-dominant: Non-dominant Hemiplegia - caused by: Cerebral infarction     Time: 1610-9604 PT Time Calculation (min) (ACUTE ONLY): 26 min  Charges:  $Therapeutic Activity: 8-22 mins $Neuromuscular Re-education: 8-22 mins                     Raymond Gurney, PT, DPT Acute Rehabilitation Services  Office: [phone]    Allison Caldwell 04/07/2023, 2:11 PM

## 2023-04-08 DIAGNOSIS — I63411 Cerebral infarction due to embolism of right middle cerebral artery: Secondary | ICD-10-CM | POA: Diagnosis not present

## 2023-04-08 DIAGNOSIS — I4891 Unspecified atrial fibrillation: Secondary | ICD-10-CM

## 2023-04-08 DIAGNOSIS — R569 Unspecified convulsions: Secondary | ICD-10-CM | POA: Diagnosis not present

## 2023-04-08 DIAGNOSIS — Z87891 Personal history of nicotine dependence: Secondary | ICD-10-CM

## 2023-04-08 DIAGNOSIS — I480 Paroxysmal atrial fibrillation: Secondary | ICD-10-CM | POA: Diagnosis not present

## 2023-04-08 LAB — GLUCOSE, CAPILLARY
Glucose-Capillary: 159 mg/dL — ABNORMAL HIGH (ref 70–99)
Glucose-Capillary: 206 mg/dL — ABNORMAL HIGH (ref 70–99)
Glucose-Capillary: 197 mg/dL — ABNORMAL HIGH (ref 70–99)
Glucose-Capillary: 164 mg/dL — ABNORMAL HIGH (ref 70–99)

## 2023-04-08 LAB — VALPROIC ACID LEVEL: Valproic Acid Lvl: 40 ug/mL — ABNORMAL LOW (ref 50.0–100.0)

## 2023-04-08 MED ORDER — PANTOPRAZOLE SODIUM 40 MG PO TBEC
40.0000 mg | DELAYED_RELEASE_TABLET | Freq: Every day | ORAL | Status: DC
  Administered 2023-04-09 – 2023-04-10 (×2): 40 mg via ORAL
  Filled 2023-04-08 (×2): qty 1

## 2023-04-08 MED ORDER — INSULIN ASPART 100 UNIT/ML IJ SOLN
0.0000 [IU] | Freq: Three times a day (TID) | INTRAMUSCULAR | Status: DC
  Administered 2023-04-09: 3 [IU] via SUBCUTANEOUS
  Administered 2023-04-09: 7 [IU] via SUBCUTANEOUS
  Administered 2023-04-10 (×2): 4 [IU] via SUBCUTANEOUS
  Administered 2023-04-10: 3 [IU] via SUBCUTANEOUS

## 2023-04-08 MED ORDER — PNEUMOCOCCAL 20-VAL CONJ VACC 0.5 ML IM SUSY
0.5000 mL | PREFILLED_SYRINGE | INTRAMUSCULAR | Status: AC
  Administered 2023-04-09: 0.5 mL via INTRAMUSCULAR
  Filled 2023-04-08: qty 0.5

## 2023-04-08 NOTE — Plan of Care (Signed)
  Problem: Education: Goal: Knowledge of disease or condition will improve Outcome: Progressing Goal: Knowledge of secondary prevention will improve (MUST DOCUMENT ALL) Outcome: Progressing Goal: Knowledge of patient specific risk factors will improve (Mark N/A or DELETE if not current risk factor) Outcome: Progressing   Problem: Intracerebral Hemorrhage Tissue Perfusion: Goal: Complications of Intracerebral Hemorrhage will be minimized Outcome: Progressing   Problem: Coping: Goal: Will verbalize positive feelings about self Outcome: Progressing Goal: Will identify appropriate support needs Outcome: Progressing   Problem: Health Behavior/Discharge Planning: Goal: Ability to manage health-related needs will improve Outcome: Progressing Goal: Goals will be collaboratively established with patient/family Outcome: Progressing   Problem: Self-Care: Goal: Ability to participate in self-care as condition permits will improve Outcome: Progressing Goal: Verbalization of feelings and concerns over difficulty with self-care will improve Outcome: Progressing Goal: Ability to communicate needs accurately will improve Outcome: Progressing   Problem: Nutrition: Goal: Risk of aspiration will decrease Outcome: Progressing Goal: Dietary intake will improve Outcome: Progressing   Problem: Education: Goal: Ability to describe self-care measures that may prevent or decrease complications (Diabetes Survival Skills Education) will improve Outcome: Progressing Goal: Individualized Educational Video(s) Outcome: Progressing   Problem: Coping: Goal: Ability to adjust to condition or change in health will improve Outcome: Progressing   Problem: Fluid Volume: Goal: Ability to maintain a balanced intake and output will improve Outcome: Progressing   Problem: Health Behavior/Discharge Planning: Goal: Ability to identify and utilize available resources and services will improve Outcome:  Progressing Goal: Ability to manage health-related needs will improve Outcome: Progressing   Problem: Metabolic: Goal: Ability to maintain appropriate glucose levels will improve Outcome: Progressing   Problem: Nutritional: Goal: Maintenance of adequate nutrition will improve Outcome: Progressing Goal: Progress toward achieving an optimal weight will improve Outcome: Progressing   Problem: Skin Integrity: Goal: Risk for impaired skin integrity will decrease Outcome: Progressing   Problem: Tissue Perfusion: Goal: Adequacy of tissue perfusion will improve Outcome: Progressing   Problem: Education: Goal: Knowledge of General Education information will improve Description: Including pain rating scale, medication(s)/side effects and non-pharmacologic comfort measures Outcome: Progressing   Problem: Health Behavior/Discharge Planning: Goal: Ability to manage health-related needs will improve Outcome: Progressing   Problem: Clinical Measurements: Goal: Ability to maintain clinical measurements within normal limits will improve Outcome: Progressing Goal: Will remain free from infection Outcome: Progressing Goal: Diagnostic test results will improve Outcome: Progressing Goal: Respiratory complications will improve Outcome: Progressing Goal: Cardiovascular complication will be avoided Outcome: Progressing   Problem: Activity: Goal: Risk for activity intolerance will decrease Outcome: Progressing   Problem: Nutrition: Goal: Adequate nutrition will be maintained Outcome: Progressing   Problem: Coping: Goal: Level of anxiety will decrease Outcome: Progressing   Problem: Elimination: Goal: Will not experience complications related to bowel motility Outcome: Progressing Goal: Will not experience complications related to urinary retention Outcome: Progressing   Problem: Pain Managment: Goal: General experience of comfort will improve Outcome: Progressing   Problem:  Safety: Goal: Ability to remain free from injury will improve Outcome: Progressing   Problem: Skin Integrity: Goal: Risk for impaired skin integrity will decrease Outcome: Progressing   

## 2023-04-08 NOTE — Progress Notes (Signed)
Subjective:  No significant overnight events. Patient feeling "great" this morning. She reports having a decent appetite and was eating breakfast when seen this morning. She particularly enjoys the Ensures and [employer]. Discussed plan for discharge to SNF on Monday. Patient was happy to hear the SNF was near [place], so she can be close to her son. All questions and concerns were addressed.   Objective:  Vital signs in last 24 hours: Vitals:   04/07/23 1643 04/07/23 2009 04/07/23 2355 04/08/23 0414  BP: 104/62 136/69 (!) 107/58 (!) 135/90  Pulse: 80 81 98 95  Resp:  18 16 17   Temp: 99.1 F (37.3 C) 98.7 F (37.1 C) 98.8 F (37.1 C) 98.3 F (36.8 C)  TempSrc: Oral Oral Oral Oral  SpO2: 100% 97% 97% 93%  Weight:      Height:        Physical Exam: Constitutional: Sitting upright in bed eating breakfast and watching TV. Very talkative and interactive. No acute distress. Eyes: Able to track across the room. Cardiovascular: Regular rate, regular rhythm. No murmurs, rubs, or gallops. Pulmonary/Chest: Normal work of breathing on room air. Lungs clear to auscultation bilaterally. Abdominal: Soft, non-tender, non-distended. Normal bowel sounds. Neurological: Left-sided upper extremity weakness on fine motor movements such as grip strength and coordination. No concerns with right upper extremity. Bilateral LE with 5/5 strength on hip flexion, plantarflexion, and dorsiflexion. Sensation intact bilaterally.   Assessment/Plan:  Principal Problem:   Hemorrhagic stroke (HCC) Active Problems:   Diabetes mellitus type 2 with complications (HCC)   Hyperlipidemia associated with type 2 diabetes mellitus (HCC)   Morbid obesity with BMI of 45.0-49.9, adult (HCC)   Hypertension associated with diabetes (HCC)   Hyponatremia   Hypomagnesemia   Hypokalemia   Pulmonary hypertension (HCC)   Focal seizure (HCC)   Paroxysmal atrial fibrillation (HCC)   Delirium   Dysphagia   Brayton Broxson is a 77 year old female with a past medical history of hypertension, type 2 diabetes who initially presented for concerns of left-sided weakness. Patient found to have acute right MCA/ACA infarct with hemorrhagic transformation and admitted on 5/25 for further evaluation and management.   #Acute embolic stroke of right MCA/ACA artery with hemorrhagic conversion Patient is gradually progressing with therapy. She was able to sit on edge of bed for ~45 seconds at a time with minimal assist from PT yesterday. She remains on a dysphagia 2 diet but is consuming most of her calories through Ensures and Mighty Shakes. Encouraged patient to eat anytime she feels hungry. Patient is medically ready for discharge. CSW submitted SNF insurance authorization on 6/7 with plans to transfer patient to [facility] on Monday (6/10). Will continue medically managing risk factors for secondary prevention as outlined below.  - Continue working with PT/OT/speech - Continue to advance diet as tolerated - SNF placement on 6/10   #Focal seizure Seizures likely secondary from stroke above. Patient has remained seizure-free. Will continue current medication regimen. -Continue Depakote 500 mg 3 times daily -Continue Keppra 1500 mg twice daily -Continue Vimpat 200 mg twice daily   #New onset atrial fibrillation Patient remains in sinus rhythm today. May consider starting apixaban is the coming days since neurology recommended holding for only 10-14 days after ICH. Will continue metoprolol succinate 100 mg daily. - Continue to monitor - Metoprolol succinate 100 mg daily   #Delirium Delirium is improving. Patient is alert and easily directable, though she continues to have moments of inattention and confusion  that worsen with fatigue. - Quetiapine 50 mg daily - Sertraline 100 mg daily - Delirium precautions placed    #Hypertension Blood pressure improving with MAPs in 70-80s throughout the  daytime yesterday. Will continue current antihypertensive regimen. - Amlodipine 10 mg daily - Losartan 100 mg daily - Metoprolol succinate 100 mg daily   #Hyperlipidemia - Pravastatin 20 mg daily   #Type 2 diabetes - Continue sliding scale insulin   Diet: Dysphagia 2 diet IVF: None VTE: Enoxaparin Code: Full PT/OT recs: SNF for Subacute PT   Dispo: Anticipated discharge to Rehab on 04/10/2023.   Whitman Hero, Medical Student 04/08/2023, 6:21 AM On call pager: [phone]

## 2023-04-09 ENCOUNTER — Inpatient Hospital Stay (HOSPITAL_COMMUNITY): Payer: Medicare HMO

## 2023-04-09 DIAGNOSIS — R569 Unspecified convulsions: Secondary | ICD-10-CM | POA: Diagnosis not present

## 2023-04-09 DIAGNOSIS — I4891 Unspecified atrial fibrillation: Secondary | ICD-10-CM | POA: Diagnosis not present

## 2023-04-09 DIAGNOSIS — R410 Disorientation, unspecified: Secondary | ICD-10-CM | POA: Diagnosis not present

## 2023-04-09 DIAGNOSIS — I63411 Cerebral infarction due to embolism of right middle cerebral artery: Secondary | ICD-10-CM | POA: Diagnosis not present

## 2023-04-09 LAB — GLUCOSE, CAPILLARY
Glucose-Capillary: 150 mg/dL — ABNORMAL HIGH (ref 70–99)
Glucose-Capillary: 203 mg/dL — ABNORMAL HIGH (ref 70–99)
Glucose-Capillary: 63 mg/dL — ABNORMAL LOW (ref 70–99)
Glucose-Capillary: 84 mg/dL (ref 70–99)
Glucose-Capillary: 231 mg/dL — ABNORMAL HIGH (ref 70–99)

## 2023-04-09 NOTE — Plan of Care (Signed)
  Problem: Nutrition: Goal: Risk of aspiration will decrease Outcome: Progressing Goal: Dietary intake will improve Outcome: Progressing   Problem: Activity: Goal: Risk for activity intolerance will decrease Outcome: Progressing   Problem: Nutrition: Goal: Adequate nutrition will be maintained Outcome: Progressing

## 2023-04-09 NOTE — Progress Notes (Signed)
IMTS Daily Note  Subjective: Feels well today. No pain, nausea. Realizes that she was confused yesterday, feels better today   Objective:  Vital signs in last 24 hours: Vitals:   04/08/23 1609 04/08/23 2018 04/09/23 0000 04/09/23 0743  BP: 131/76 (!) 155/99 (!) 148/89 (!) 153/92  Pulse: 97 95 90 70  Resp: 20 18 18 18   Temp: 98.6 F (37 C) 98.6 F (37 C) 98.5 F (36.9 C) (!) 97.4 F (36.3 C)  TempSrc: Oral Oral Oral Oral  SpO2: 98% 97% 98% 96%  Weight:      Height:       GEN: lying in bed, no acute distress CV: rrr, no m/r/g PULM: lung clear bilaterally, no wheezes or crackles NEURO: 5/5 strength in RUE, 4/5 L hand grip strength, 5/5 strength in b/l LE   Assessment/Plan:  Principal Problem:   Hemorrhagic stroke (HCC) Active Problems:   Diabetes mellitus type 2 with complications (HCC)   Hyperlipidemia associated with type 2 diabetes mellitus (HCC)   Morbid obesity with BMI of 45.0-49.9, adult (HCC)   Hypertension associated with diabetes (HCC)   Hyponatremia   Hypomagnesemia   Hypokalemia   Pulmonary hypertension (HCC)   Focal seizure (HCC)   Paroxysmal atrial fibrillation (HCC)   Delirium   Dysphagia  77yo woman with HTN & T2DM who presented with left-sided weakness 2/2 a right MCA/ACA infarct with hemorrhagic transformation, now medically ready for SNF.  # Acute embolic stroke of R MCA/ACA artery with hemorrhagic conversion, likely 2/2 Afib - It has been 2 weeks since her hemorrhagic conversion, so we will repeat CT head today. If this is stable, plan is to start a DOAC. I discussed plan with Dr. Roda Shutters today - PT/OT/SLP recommending SNF - Pravastatin 20mg  daily   # Seizure: likely 2/2 stroke.  - Continue Depakote 500mg  TID, Keppra 1500mg  BID, and Vimpat 200mg  daily for now  # New onset paroxysmal atrial fibrillation - Continue metoprolol XL 100mg  daily  - Head CT today, then possibly starting Apixaban, as above  # Delirium- improving  - Continue Quetiapine  50mg  daily. I am hopeful this can be weaned off when she is at [facility]  CHRONIC STABLE ISSUES  # Depression - Continue home sertraline 100mg  daily  # HTN - Continue Amlodipine 10mg  daily, Losartan 100mg  daily, Metoprolol XL 100mg  daily   # T2DM - SSI   Core measures - diet: dysphagia 2 - DVT ppx: lovenox 40mg  daily - plan to switch to eliquis pending head CT  Dispo: Anticipated discharge in approximately 1 day(s) - she has a SNF bed for 6/10   Mercie Eon, MD 04/09/2023, 7:50 AM

## 2023-04-09 NOTE — Progress Notes (Signed)
Hypoglycemic Event  CBG: 63  Treatment: 4 oz juice/soda  Symptoms: None  Follow-up CBG: Time:84 CBG Result:1645  Possible Reasons for Event: Inadequate meal intake  Comments/MD notified: Drinking ensure shake now.     Thom Chimes

## 2023-04-10 ENCOUNTER — Other Ambulatory Visit (HOSPITAL_COMMUNITY): Payer: Self-pay

## 2023-04-10 DIAGNOSIS — M79651 Pain in right thigh: Secondary | ICD-10-CM | POA: Diagnosis not present

## 2023-04-10 DIAGNOSIS — Z794 Long term (current) use of insulin: Secondary | ICD-10-CM | POA: Diagnosis not present

## 2023-04-10 DIAGNOSIS — Z7401 Bed confinement status: Secondary | ICD-10-CM | POA: Diagnosis not present

## 2023-04-10 DIAGNOSIS — R531 Weakness: Secondary | ICD-10-CM | POA: Diagnosis not present

## 2023-04-10 DIAGNOSIS — I4891 Unspecified atrial fibrillation: Secondary | ICD-10-CM | POA: Diagnosis not present

## 2023-04-10 DIAGNOSIS — Z87891 Personal history of nicotine dependence: Secondary | ICD-10-CM | POA: Diagnosis not present

## 2023-04-10 DIAGNOSIS — I119 Hypertensive heart disease without heart failure: Secondary | ICD-10-CM | POA: Diagnosis not present

## 2023-04-10 DIAGNOSIS — I480 Paroxysmal atrial fibrillation: Secondary | ICD-10-CM | POA: Diagnosis not present

## 2023-04-10 DIAGNOSIS — I10 Essential (primary) hypertension: Secondary | ICD-10-CM | POA: Diagnosis not present

## 2023-04-10 DIAGNOSIS — I69391 Dysphagia following cerebral infarction: Secondary | ICD-10-CM | POA: Diagnosis not present

## 2023-04-10 DIAGNOSIS — J690 Pneumonitis due to inhalation of food and vomit: Secondary | ICD-10-CM | POA: Diagnosis not present

## 2023-04-10 DIAGNOSIS — W06XXXA Fall from bed, initial encounter: Secondary | ICD-10-CM | POA: Diagnosis not present

## 2023-04-10 DIAGNOSIS — T17920A Food in respiratory tract, part unspecified causing asphyxiation, initial encounter: Secondary | ICD-10-CM | POA: Diagnosis not present

## 2023-04-10 DIAGNOSIS — Z7901 Long term (current) use of anticoagulants: Secondary | ICD-10-CM | POA: Diagnosis not present

## 2023-04-10 DIAGNOSIS — Z79899 Other long term (current) drug therapy: Secondary | ICD-10-CM | POA: Diagnosis not present

## 2023-04-10 DIAGNOSIS — Z1152 Encounter for screening for COVID-19: Secondary | ICD-10-CM | POA: Diagnosis not present

## 2023-04-10 DIAGNOSIS — G4089 Other seizures: Secondary | ICD-10-CM | POA: Diagnosis not present

## 2023-04-10 DIAGNOSIS — R1312 Dysphagia, oropharyngeal phase: Secondary | ICD-10-CM | POA: Diagnosis not present

## 2023-04-10 DIAGNOSIS — M25551 Pain in right hip: Secondary | ICD-10-CM | POA: Diagnosis not present

## 2023-04-10 DIAGNOSIS — R2681 Unsteadiness on feet: Secondary | ICD-10-CM | POA: Diagnosis not present

## 2023-04-10 DIAGNOSIS — I619 Nontraumatic intracerebral hemorrhage, unspecified: Secondary | ICD-10-CM | POA: Diagnosis not present

## 2023-04-10 DIAGNOSIS — J449 Chronic obstructive pulmonary disease, unspecified: Secondary | ICD-10-CM | POA: Diagnosis not present

## 2023-04-10 DIAGNOSIS — I152 Hypertension secondary to endocrine disorders: Secondary | ICD-10-CM | POA: Diagnosis not present

## 2023-04-10 DIAGNOSIS — J029 Acute pharyngitis, unspecified: Secondary | ICD-10-CM | POA: Diagnosis not present

## 2023-04-10 DIAGNOSIS — E669 Obesity, unspecified: Secondary | ICD-10-CM | POA: Diagnosis not present

## 2023-04-10 DIAGNOSIS — E785 Hyperlipidemia, unspecified: Secondary | ICD-10-CM | POA: Diagnosis not present

## 2023-04-10 DIAGNOSIS — R059 Cough, unspecified: Secondary | ICD-10-CM | POA: Diagnosis not present

## 2023-04-10 DIAGNOSIS — J9601 Acute respiratory failure with hypoxia: Secondary | ICD-10-CM | POA: Diagnosis not present

## 2023-04-10 DIAGNOSIS — Z8673 Personal history of transient ischemic attack (TIA), and cerebral infarction without residual deficits: Secondary | ICD-10-CM | POA: Diagnosis not present

## 2023-04-10 DIAGNOSIS — R52 Pain, unspecified: Secondary | ICD-10-CM | POA: Diagnosis not present

## 2023-04-10 DIAGNOSIS — R1310 Dysphagia, unspecified: Secondary | ICD-10-CM | POA: Diagnosis not present

## 2023-04-10 DIAGNOSIS — W2203XA Walked into furniture, initial encounter: Secondary | ICD-10-CM | POA: Diagnosis not present

## 2023-04-10 DIAGNOSIS — M25552 Pain in left hip: Secondary | ICD-10-CM | POA: Diagnosis not present

## 2023-04-10 DIAGNOSIS — T17900A Unspecified foreign body in respiratory tract, part unspecified causing asphyxiation, initial encounter: Secondary | ICD-10-CM | POA: Diagnosis not present

## 2023-04-10 DIAGNOSIS — X58XXXA Exposure to other specified factors, initial encounter: Secondary | ICD-10-CM | POA: Diagnosis not present

## 2023-04-10 DIAGNOSIS — Z9181 History of falling: Secondary | ICD-10-CM | POA: Diagnosis not present

## 2023-04-10 DIAGNOSIS — F419 Anxiety disorder, unspecified: Secondary | ICD-10-CM | POA: Diagnosis not present

## 2023-04-10 DIAGNOSIS — I272 Pulmonary hypertension, unspecified: Secondary | ICD-10-CM | POA: Diagnosis not present

## 2023-04-10 DIAGNOSIS — E119 Type 2 diabetes mellitus without complications: Secondary | ICD-10-CM | POA: Diagnosis not present

## 2023-04-10 DIAGNOSIS — R569 Unspecified convulsions: Secondary | ICD-10-CM | POA: Diagnosis not present

## 2023-04-10 DIAGNOSIS — E559 Vitamin D deficiency, unspecified: Secondary | ICD-10-CM | POA: Diagnosis not present

## 2023-04-10 DIAGNOSIS — I69354 Hemiplegia and hemiparesis following cerebral infarction affecting left non-dominant side: Secondary | ICD-10-CM | POA: Diagnosis not present

## 2023-04-10 DIAGNOSIS — G40919 Epilepsy, unspecified, intractable, without status epilepticus: Secondary | ICD-10-CM | POA: Diagnosis not present

## 2023-04-10 DIAGNOSIS — E1165 Type 2 diabetes mellitus with hyperglycemia: Secondary | ICD-10-CM | POA: Diagnosis not present

## 2023-04-10 DIAGNOSIS — Z23 Encounter for immunization: Secondary | ICD-10-CM | POA: Diagnosis not present

## 2023-04-10 DIAGNOSIS — Z7984 Long term (current) use of oral hypoglycemic drugs: Secondary | ICD-10-CM | POA: Diagnosis not present

## 2023-04-10 DIAGNOSIS — M6281 Muscle weakness (generalized): Secondary | ICD-10-CM | POA: Diagnosis not present

## 2023-04-10 DIAGNOSIS — Z7951 Long term (current) use of inhaled steroids: Secondary | ICD-10-CM | POA: Diagnosis not present

## 2023-04-10 DIAGNOSIS — R001 Bradycardia, unspecified: Secondary | ICD-10-CM | POA: Diagnosis not present

## 2023-04-10 DIAGNOSIS — I517 Cardiomegaly: Secondary | ICD-10-CM | POA: Diagnosis not present

## 2023-04-10 LAB — CBC
HCT: 38.2 % (ref 36.0–46.0)
Hemoglobin: 12.3 g/dL (ref 12.0–15.0)
MCH: 30.1 pg (ref 26.0–34.0)
MCHC: 32.2 g/dL (ref 30.0–36.0)
MCV: 93.4 fL (ref 80.0–100.0)
Platelets: 265 10*3/uL (ref 150–400)
RBC: 4.09 MIL/uL (ref 3.87–5.11)
RDW: 15.3 % (ref 11.5–15.5)
WBC: 6.4 10*3/uL (ref 4.0–10.5)
nRBC: 0 % (ref 0.0–0.2)

## 2023-04-10 LAB — GLUCOSE, CAPILLARY
Glucose-Capillary: 135 mg/dL — ABNORMAL HIGH (ref 70–99)
Glucose-Capillary: 195 mg/dL — ABNORMAL HIGH (ref 70–99)
Glucose-Capillary: 154 mg/dL — ABNORMAL HIGH (ref 70–99)

## 2023-04-10 MED ORDER — LEVETIRACETAM 750 MG PO TABS
1500.0000 mg | ORAL_TABLET | Freq: Two times a day (BID) | ORAL | 0 refills | Status: DC
  Filled 2023-04-10: qty 60, 15d supply, fill #0

## 2023-04-10 MED ORDER — MELATONIN 3 MG PO TABS: 3.0000 mg | ORAL_TABLET | Freq: Every day | ORAL | 0 refills | Status: DC

## 2023-04-10 MED ORDER — AMLODIPINE BESYLATE 10 MG PO TABS: 10.0000 mg | ORAL_TABLET | Freq: Every day | ORAL | 0 refills | Status: DC

## 2023-04-10 MED ORDER — QUETIAPINE FUMARATE 50 MG PO TABS: 50.0000 mg | ORAL_TABLET | Freq: Every day | ORAL | 0 refills | Status: AC

## 2023-04-10 MED ORDER — VALPROIC ACID 250 MG/5ML PO SOLN: 500.0000 mg | Freq: Three times a day (TID) | ORAL | 0 refills | Status: AC

## 2023-04-10 MED ORDER — LACOSAMIDE 200 MG PO TABS: 200.0000 mg | ORAL_TABLET | Freq: Two times a day (BID) | ORAL | 0 refills | Status: AC

## 2023-04-10 MED ORDER — INSULIN GLARGINE-YFGN 100 UNIT/ML ~~LOC~~ SOLN: 10.0000 [IU] | Freq: Every day | SUBCUTANEOUS | 11 refills | Status: AC

## 2023-04-10 MED ORDER — LACOSAMIDE 200 MG PO TABS: 200.0000 mg | ORAL_TABLET | Freq: Two times a day (BID) | ORAL | 0 refills | Status: DC

## 2023-04-10 MED ORDER — NUTRA/SHAKE (FROZEN) PO LIQD: 1.0000 | Freq: Two times a day (BID) | ORAL | 0 refills | Status: DC

## 2023-04-10 MED ORDER — LOSARTAN POTASSIUM 100 MG PO TABS: 100.0000 mg | ORAL_TABLET | Freq: Every day | ORAL | 0 refills | Status: AC

## 2023-04-10 MED ORDER — INSULIN GLARGINE-YFGN 100 UNIT/ML ~~LOC~~ SOLN
10.0000 [IU] | Freq: Every day | SUBCUTANEOUS | Status: DC
  Administered 2023-04-10: 10 [IU] via SUBCUTANEOUS
  Filled 2023-04-10: qty 0.1

## 2023-04-10 MED ORDER — AMLODIPINE BESYLATE 10 MG PO TABS: 10.0000 mg | ORAL_TABLET | Freq: Every day | ORAL | 0 refills | Status: AC

## 2023-04-10 MED ORDER — METOPROLOL SUCCINATE ER 100 MG PO TB24
100.0000 mg | ORAL_TABLET | Freq: Every day | ORAL | 0 refills | Status: AC
  Filled 2023-04-10: qty 30, 30d supply, fill #0

## 2023-04-10 MED ORDER — APIXABAN 5 MG PO TABS: 5.0000 mg | ORAL_TABLET | Freq: Two times a day (BID) | ORAL | 0 refills | Status: AC

## 2023-04-10 MED ORDER — LOSARTAN POTASSIUM 100 MG PO TABS: 100.0000 mg | ORAL_TABLET | Freq: Every day | ORAL | 0 refills | Status: DC

## 2023-04-10 MED ORDER — ENSURE ENLIVE PO LIQD: 237.0000 mL | Freq: Two times a day (BID) | ORAL | 12 refills | Status: AC

## 2023-04-10 NOTE — TOC Transition Note (Signed)
Transition of Care [facility]) - CM/SW Discharge Note   Patient Details  Name: Allison Caldwell MRN: [id number] Date of Birth: November 27, 1945  [facility] of [facility]) CM/SW Contact:  Baldemar Lenis, LCSW Phone Number: 04/10/2023, 3:36 PM   Clinical Narrative:   CSW received update that patient's son arrived to [facility], paperwork complete. Transport arranged with [facility] for next available.  Nurse to call report to [phone].    Final next level of care: Skilled Nursing Facility Barriers to Discharge: Barriers Resolved   Patient Goals and CMS Choice CMS Medicare.gov Compare Post Acute Care list provided to:: Patient Represenative (must comment) Choice offered to / list presented to : Adult Children  Discharge Placement                Patient chooses bed at: [facility] Patient to be transferred to facility by: [facility] Name of family member notified: Lucia Bitter Patient and family notified of of transfer: 04/10/23  Discharge Plan and Services Additional resources added to the After Visit Summary for       Post Acute Care Choice: Skilled Nursing Facility                               Social Determinants of Health (SDOH) Interventions SDOH Screenings   Food Insecurity: No Food Insecurity (03/25/2023)  Housing: Low Risk  (03/25/2023)  Transportation Needs: No Transportation Needs (03/25/2023)  Utilities: Not At Risk (03/25/2023)  Depression (PHQ2-9): Medium Risk (06/29/2021)  Tobacco Use: Medium Risk (04/03/2023)     Readmission Risk Interventions     No data to display

## 2023-04-10 NOTE — TOC Progression Note (Signed)
Transition of Care [facility]) - Progression Note    Patient Details  Name: Allison Caldwell MRN: [id number] Date of Birth: [birth year]  Transition of Care [facility]) CM/SW Contact  Baldemar Lenis, [place] Phone Number: 04/10/2023, 2:03 PM  Clinical Narrative:   CSW attempting to reach patient's family about discharging to [facility]. CSW left voicemail for son, Madelaine Bhat, awaiting call back. CSW called Enrique Sack, spoke with her about admission to [facility], and son will need to do paperwork as she is at work. CSW unable to reach son at this point about admitting patient into [facility]. CSW to follow.    Expected Discharge Plan: Skilled Nursing Facility Barriers to Discharge: Family Issues  Expected Discharge Plan and Services     Post Acute Care Choice: Skilled Nursing Facility Living arrangements for the past 2 months: Apartment Expected Discharge Date: 04/10/23                                     Social Determinants of Health (SDOH) Interventions SDOH Screenings   Food Insecurity: No Food Insecurity (03/25/2023)  Housing: Low Risk  (03/25/2023)  Transportation Needs: No Transportation Needs (03/25/2023)  Utilities: Not At Risk (03/25/2023)  Depression (PHQ2-9): Medium Risk (06/29/2021)  Tobacco Use: Medium Risk (04/03/2023)    Readmission Risk Interventions     No data to display

## 2023-04-10 NOTE — Discharge Summary (Addendum)
Name: Allison Caldwell MRN: [id number] DOB: 23-Aug-1946 77 y.o. PCP: Allison Agee, MD  Date of Admission: 03/25/2023  3:48 AM Date of Discharge: 04/10/2023 Attending Physician: Dr. Heide Spark  Discharge Diagnosis: Principal Problem:   Hemorrhagic stroke [facility]) Active Problems:   Diabetes mellitus type 2 with complications (HCC)   Hyperlipidemia associated with type 2 diabetes mellitus (HCC)   Morbid obesity with BMI of 45.0-49.9, adult (HCC)   Hypertension associated with diabetes (HCC)   Hyponatremia   Hypomagnesemia   Hypokalemia   Pulmonary hypertension (HCC)   Focal seizure (HCC)   Paroxysmal atrial fibrillation (HCC)   Delirium   Dysphagia    Discharge Medications: Allergies as of 04/10/2023       Reactions   Ace Inhibitors Other (See Comments)    cough   Penicillins Other (See Comments)   Muscle spasms, Has patient had a PCN reaction causing immediate rash, facial/tongue/throat swelling, SOB or lightheadedness with hypotension: Yes Has patient had a PCN reaction causing severe rash involving mucus membranes or skin necrosis: No Has patient had a PCN reaction that required hospitalization No Has patient had a PCN reaction occurring within the last 10 years: Yes If all of the above answers are "NO", then may proceed with Cephalosporin use.        Medication List     STOP taking these medications    aspirin EC 81 MG tablet   olmesartan 40 MG tablet Commonly known as: BENICAR       TAKE these medications    Accu-Chek FastClix Lancets Misc Check blood sugar 1 time a day   Accu-Chek Softclix Lancets lancets Use as directed up to 4 times daily.   Accu-Chek Guide test strip Generic drug: glucose blood Check blood sugar 1 time per day   Accu-Chek Guide test strip Generic drug: glucose blood Use as directed up to four times daily   Accu-Chek Guide w/Device Kit 1 each by Does not apply route daily. Check blood sugar 1 time a day    Accu-Chek Guide w/Device Kit Use up to four times daily as directed.   amLODipine 10 MG tablet Commonly known as: NORVASC Take 1 tablet (10 mg total) by mouth daily. Start taking on: April 11, 2023   apixaban 5 MG Tabs tablet Commonly known as: ELIQUIS Take 1 tablet (5 mg total) by mouth 2 (two) times daily.   feeding supplement Liqd Take 237 mLs by mouth 2 (two) times daily between meals.   Nutra/Shake (Frozen) Liqd Take 1 Bottle by mouth 2 (two) times daily.   insulin glargine-yfgn 100 UNIT/ML injection Commonly known as: SEMGLEE Inject 0.1 mLs (10 Units total) into the skin daily.   lacosamide 200 MG Tabs tablet Commonly known as: VIMPAT Take 1 tablet (200 mg total) by mouth 2 (two) times daily.   levETIRAcetam 750 MG tablet Commonly known as: KEPPRA Take 2 tablets (1,500 mg total) by mouth 2 (two) times daily.   losartan 100 MG tablet Commonly known as: COZAAR Take 1 tablet (100 mg total) by mouth daily. Start taking on: April 11, 2023   melatonin 3 MG Tabs tablet Take 1 tablet (3 mg total) by mouth at bedtime.   metFORMIN 500 MG tablet Commonly known as: GLUCOPHAGE Take 1 tablet (500 mg total) by mouth 2 (two) times daily with a meal.   metoprolol succinate 100 MG 24 hr tablet Commonly known as: TOPROL-XL Take 1 tablet (100 mg total) by mouth daily. Take with or immediately following a meal.  Start taking on: April 11, 2023   multivitamin with minerals Tabs tablet Take 1 tablet by mouth daily.   pravastatin 20 MG tablet Commonly known as: PRAVACHOL TAKE 1 TABLET AT BEDTIME What changed: when to take this   QUEtiapine 50 MG tablet Commonly known as: SEROQUEL Take 1 tablet (50 mg total) by mouth daily. Start taking on: April 11, 2023   sertraline 100 MG tablet Commonly known as: ZOLOFT Take 1 tablet (100 mg total) by mouth daily.   TechLite Pen Needles 31G X 8 MM Misc Generic drug: Insulin Pen Needle Use 4 (four) times daily with insulin pens.    Tiadylt ER 420 MG 24 hr capsule Generic drug: diltiazem TAKE 1 CAPSULE EVERY DAY What changed: how much to take   Trelegy Ellipta 100-62.5-25 MCG/ACT Aepb Generic drug: Fluticasone-Umeclidin-Vilant Inhale 1 puff into the lungs daily.   valproic acid 250 MG/5ML solution Commonly known as: DEPAKENE Take 10 mLs (500 mg total) by mouth every 8 (eight) hours.   Ventolin HFA 108 (90 Base) MCG/ACT inhaler Generic drug: albuterol Inhale 2 puffs into the lungs every 6 (six) hours as needed for wheezing or shortness of breath.        Disposition and follow-up:   Allison Caldwell was discharged from [facility] in Good condition.  At the hospital follow up visit please address:  1.  Follow-up:  a.  Hemorrhagic stroke: Patient initially admitted for right MCA/ACA stroke with hemorrhagic conversion.  Stroke was likely secondary to embolic source from atrial fibrillation.  Patient discharged on Eliquis after CT head showed stable stroke.  Continue to follow neurological function outpatient.  Patient has been referred for neurology follow-up, ensure patient goes to follow-up.  Discharge to SNF.    b.  Newly diagnosed atrial fibrillation: Patient discharged on metoprolol succinate 100 mg daily, patient also discharged on Eliquis.  Ensure patient remains rate controlled.  Monitor for any signs of bleeding.  Monitor for any further neurological demise.   c.  Seizures: Post stroke, patient developed seizure-like activity.  Patient was started on Keppra, Vimpat, as well as Depakote.  Patient remained seizure-free during hospitalization.  Ensure patient continues to remain seizure-free.  Check Depakote levels at follow-up.   d.  Diabetes: A1c during hospitalization elevated.  Patient discharged on glargine 10 mg daily.  Patient was also started on metformin.  Continue to monitor outpatient.              E. Delirium: This is much improved now. Patient was started on seroquel in  the hospital and this was weaned to 50 mg daily. Please attempt to wean this off as she improves.  2.  Labs / imaging needed at time of follow-up: BMP, CBC, Depakote level   3.  Pending labs/ test needing follow-up: N/A  4.  Medication Changes  Patient started on losartan 100 mg daily, amlodipine 10 mg daily, metoprolol succinate 100 mg daily.  Patient also started on Eliquis 5 mg twice daily.  Patient also started on Depakote 500 mg every 8 hours, Keppra 1500 mg twice daily, and Vimpat 200 mg twice daily as well as seroquel 50 mg daily  Follow-up Appointments:  Contact information for follow-up providers     Micki Riley, MD Follow up.   Specialties: Neurology, Radiology Why: Please await for phone call to be schedule for stroke follow up Contact information: [address] [place] [address] [phone]  Adron Bene, MD Follow up on 04/20/2023.   Why: Please follow-up with Dr. Cliffton Asters on 04/20/2023 at 9:15 AM.  This is for your hospital follow up with your primary care office. Contact information: [address] [place] [address] [phone]              Contact information for after-discharge care     Destination     [facility] Preferred SNF .   Service: Skilled Nursing Contact information: [address] [place] [address] [phone]                     Hospital Course by problem list:  Allison Caldwell is a 77 yo female with HTN, T2DM, and obesity who presented with left sided weakness and was admitted on 5/25 for hemorrhagic transformation of R parietal stroke c/b seizures. The patient was transferred out of the neuro ICU on 6/3.   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 MRI brain on admission showed a moderate-sized acute hemorrhagic right MCA infarct and small acute right ACA infarct, likely  embolic due to newly diagnosed atrial fibrillation. No anticoagulants or antiplatelets were initially started due to ICH initially. Further imaging with CTA head neck revealed no emergent vascular findings, but did show a large main pulmonary artery as seen with pulmonary hypertension. Echo was without PFO and showed an EF of 50-55% and moderate LVH. Carotid imaging unremarkable. Labs notable for an LDL at goal, at 54. Patient gradually progressing towards therapy goals. She continues to have some left upper extremity weakness but is learning to compensate. She is currently on a dysphagia 2 diet, consuming most calories through Ensures and Might[employer].  Later on hospital course, repeat head CT showed decreased hemorrhagic stroke burden, and after thorough consultation with neurology we decided to start Eliquis 5 mg twice daily.  Anticipate improvement at SNF as she continues recovering.  Focal seizure from right brain Patient had 3 episodes of left arm rhythmic twitching and then left eyelid twitching spreading to the right eyelid on 5/27, but patient was able to talk and remained oriented during the episode. Episode lasted 2-4 min and she received ativan 2 mg. After, she was loaded with keppra 3 g, followed by 500 mg bid. She had a breakthrough seizure the next day and her keppra was increased to 1500 mg bid. The patient was also started on a depakote load, followed by 500 mg bid. She continued to have breakthrough seizures and then was also started on Vimpat 200 mg bid. Patient has remained seizure-free since 5/29. Will continue current antiepileptic regimen of Depakote 500 mg TID, Keppra 1500 mg BID, and Vimpat 200 mg daily at discharge.  New onset atrial fibrillation Patient presented in atrial fibrillation with RVR. Etiology of stroke determined to likely be embolic in nature in the setting on new onset atrial fibrillation. Anticoagulation was not initially started due to ICH. CT head was repeated on  6/9, which showed decreased conspicuity of hemorrhage within the infarct along R temporoparietal junction. Apixaban 5 mg BID was subsequently started on 6/10. Patient self-converted to sinus rhythm on 6/5. She remains in sinus rhythm of day of discharge. Will continue metoprolol succinate 100 mg daily and apixaban 5 mg BID at discharge.   Delirium Hospital course was complicated by agitated delirium in ICU. Patient was started on quetiapine 100 mg BID. Delirium improved once patient was  transferred to floor. Quetiapine was weaned to 50 mg daily. Patient is alert and easily directable on day of discharge, though she continues to have moments of inattention and confusion that worsen with fatigue. Anticipate quetiapine can be weaned once at [facility].  Hypertension On olmesartan 40 mg at home. BP was high during admission and patient required a Cleviprex drip while in the ICU. Once she was transferred out of the neuro ICU, BP was managed with PO amlodipine 10 mg, losartan 100 mg, and metoprolol succinate 100 mg daily. Her BP was well-controlled on day of discharge, so will continue this antihypertensive regimen at discharge.  Hyperlipidemia Patient taking pravastatin 20 mg daily at home. LDL 54 at goal. Will continue pravastatin 20 mg daily at discharge.  Type 2 Diabetes HgbA1c 8.8. Patient received SSI while hospitalized. Started on glargine 10 units on day of discharge, and will continue at discharge.   Discharge Subjective:  No acute overnight events. Patient feeling well this morning. She reports eating sausage, eggs, and oatmeal for breakfast. She says her appetite is better. Discussed plan for discharge to SNF today. All questions and concerns were answered.  Discharge Exam:   BP (!) 131/95 (BP Location: Left Arm)   Pulse 74   Temp 98.6 F (37 C) (Oral)   Resp 18   Ht 5\' 4"  (1.626 m)   Wt 128 kg   SpO2 96%   BMI 48.44 kg/m  Constitutional: Lying comfortably in bed watching TV. Very  talkative and interactive, somewhat confused. No acute distress. Eyes: Able to track across the room. Cardiovascular: Regular rate, regular rhythm. No murmurs, rubs, or gallops. Pulmonary/Chest: Normal work of breathing on room air. Lungs clear to auscultation anteriorly. Abdominal: Soft, non-tender, non-distended. Normal bowel sounds. Neurological: Left-sided upper extremity weakness on fine motor movements such as grip strength and coordination. No concerns with right upper extremity. Bilateral LE with 5/5 strength on hip flexion, plantarflexion, and dorsiflexion. Sensation intact bilaterally.  Pertinent Labs, Studies, and Procedures:     Latest Ref Rng & Units 04/10/2023    7:45 AM 04/05/2023    7:14 AM 04/04/2023    6:50 AM  CBC  WBC 4.0 - 10.5 K/uL 6.4  7.3  7.3   Hemoglobin 12.0 - 15.0 g/dL 40.9  81.1  91.4   Hematocrit 36.0 - 46.0 % 38.2  39.7  39.2   Platelets 150 - 400 K/uL 265  274  271        Latest Ref Rng & Units 04/05/2023    7:14 AM 04/04/2023    6:50 AM 04/03/2023    8:01 AM  CMP  Glucose 70 - 99 mg/dL 782  956  66   BUN 8 - 23 mg/dL 20  26  30    Creatinine 0.44 - 1.00 mg/dL 2.13  0.86  5.78   Sodium 135 - 145 mmol/L 137  133  138   Potassium 3.5 - 5.1 mmol/L 4.0  4.0  4.0   Chloride 98 - 111 mmol/L 98  96  99   CO2 22 - 32 mmol/L 28  27  28    Calcium 8.9 - 10.3 mg/dL 9.3  9.0  9.3     ECHOCARDIOGRAM COMPLETE  Result Date: 03/26/2023    ECHOCARDIOGRAM REPORT   Patient Name:   Allison Caldwell Date of Exam: 03/25/2023 Medical Rec #:  [id number]             Height:       64.0 in Accession #:  [id number]            Weight:       279.3 lb Date of Birth:  [birth year]             BSA:          2.255 m Patient Age:    55 years              BP:           119/103 mmHg Patient Gender: F                     HR:           124 bpm. Exam Location:  Inpatient Procedure: 2D Echo, Cardiac Doppler, Color Doppler and Intracardiac            Opacification Agent Indications:    Stroke   History:        Patient has prior history of Echocardiogram examinations, most                 recent 05/08/2022. Cardiomyopathy, TIA; Risk Factors:Obesity,                 Hypertension and Diabetes.  Sonographer:    Milda Smart Referring Phys: [facility]  Sonographer Comments: Technically difficult study due to poor echo windows. Image acquisition challenging due to patient body habitus, Image acquisition challenging due to uncooperative patient and Image acquisition challenging due to respiratory motion. IMPRESSIONS  1. Mild LVH with additional proximal septal thickening.  2. Left ventricular ejection fraction, by estimation, is 50 to 55%. The left ventricle has low normal function. The left ventricle has no regional wall motion abnormalities. There is moderate left ventricular hypertrophy. Left ventricular diastolic parameters are indeterminate.  3. Right ventricular systolic function is normal. The right ventricular size is normal. Tricuspid regurgitation signal is inadequate for assessing PA pressure.  4. The mitral valve is normal in structure. Trivial mitral valve regurgitation. No evidence of mitral stenosis.  5. The aortic valve is tricuspid. Aortic valve regurgitation is trivial. No aortic stenosis is present.  6. Aortic dilatation noted. There is mild dilatation of the ascending aorta, measuring 44 mm.  7. Moderately dilated pulmonary artery.  8. The inferior vena cava is dilated in size with >50% respiratory variability, suggesting right atrial pressure of 8 mmHg. FINDINGS  Left Ventricle: Left ventricular ejection fraction, by estimation, is 50 to 55%. The left ventricle has low normal function. The left ventricle has no regional wall motion abnormalities. Definity contrast agent was given IV to delineate the left ventricular endocardial borders. The left ventricular internal cavity size was normal in size. There is moderate left ventricular hypertrophy. Left ventricular diastolic parameters  are indeterminate. Right Ventricle: The right ventricular size is normal. Right ventricular systolic function is normal. Tricuspid regurgitation signal is inadequate for assessing PA pressure. Left Atrium: Left atrial size was normal in size. Right Atrium: Right atrial size was normal in size. Pericardium: There is no evidence of pericardial effusion. Mitral Valve: The mitral valve is normal in structure. Trivial mitral valve regurgitation. No evidence of mitral valve stenosis. Tricuspid Valve: The tricuspid valve is normal in structure. Tricuspid valve regurgitation is trivial. No evidence of tricuspid stenosis. Aortic Valve: The aortic valve is tricuspid. Aortic valve regurgitation is trivial. No aortic stenosis is present. Pulmonic Valve: The pulmonic valve was normal in structure. Pulmonic valve regurgitation is trivial. No evidence of pulmonic stenosis. Aorta: Aortic dilatation noted. There is  mild dilatation of the ascending aorta, measuring 44 mm. Pulmonary Artery: The pulmonary artery is moderately dilated. Venous: The inferior vena cava is dilated in size with greater than 50% respiratory variability, suggesting right atrial pressure of 8 mmHg. IAS/Shunts: No atrial level shunt detected by color flow Doppler. Additional Comments: Mild LVH with additional proximal septal thickening.  LEFT VENTRICLE PLAX 2D LVIDd:         4.20 cm   Diastology LVIDs:         3.10 cm   LV e' medial:    5.84 cm/s LV PW:         1.10 cm   LV E/e' medial:  15.3 LV IVS:        1.10 cm   LV e' lateral:   7.46 cm/s LVOT diam:     2.00 cm   LV E/e' lateral: 11.9 LV SV:         64 LV SV Index:   28 LVOT Area:     3.14 cm  RIGHT VENTRICLE             IVC RV S prime:     10.10 cm/s  IVC diam: 2.30 cm TAPSE (M-mode): 2.5 cm LEFT ATRIUM           Index        RIGHT ATRIUM           Index LA diam:      3.90 cm 1.73 cm/m   RA Area:     19.40 cm LA Vol (A2C): 53.1 ml 23.55 ml/m  RA Volume:   50.10 ml  22.22 ml/m LA Vol (A4C): 67.1 ml  29.76 ml/m  AORTIC VALVE LVOT Vmax:   138.00 cm/s LVOT Vmean:  97.100 cm/s LVOT VTI:    0.204 m  AORTA Ao Root diam: 3.40 cm Ao Asc diam:  4.40 cm MITRAL VALVE               TRICUSPID VALVE MV Area (PHT): 4.97 cm    TR Peak grad:   9.1 mmHg MV Decel Time: 153 msec    TR Vmax:        151.00 cm/s MV E velocity: 89.10 cm/s                            SHUNTS                            Systemic VTI:  0.20 m                            Systemic Diam: 2.00 cm Olga Millers MD Electronically signed by Olga Millers MD Signature Date/Time: 03/26/2023/9:56:45 AM    Final    MR BRAIN W WO CONTRAST  Result Date: 03/25/2023 CLINICAL DATA:  Neuro deficit, acute, stroke suspected. EXAM: MRI HEAD WITHOUT AND WITH CONTRAST TECHNIQUE: Multiplanar, multiecho pulse sequences of the brain and surrounding structures were obtained without and with intravenous contrast. CONTRAST:  10mL GADAVIST GADOBUTROL 1 MMOL/ML IV SOLN COMPARISON:  Head CT and CTA 03/25/2023 FINDINGS: The study is intermittently up to moderately motion degraded. Brain: A moderate-sized region of cortical and subcortical edema, restricted diffusion, and hemorrhage in the right parietal lobe and posterior insula is most consistent with a hemorrhagic MCA infarct as suggesting on today's earlier CT. There is a small amount of  associated non-masslike enhancement. There is a smaller acute infarct in the anteromedial right frontal lobe (ACA territory) with a small amount of petechial hemorrhage. Patchy T2 hyperintensities elsewhere in the cerebral white matter bilaterally are nonspecific but compatible with moderate chronic small vessel ischemic disease. A chronic lacunar infarct is noted in the right caudate nucleus. There is mild cerebral and cerebellar atrophy. No midline shift or extra-axial fluid collection is evident. Vascular: Major intracranial vascular flow voids are preserved. Skull and upper cervical spine: Unremarkable bone marrow signal. Sinuses/Orbits:  Right cataract extraction. Opacified, expanded posterior right ethmoid air cells which may reflect a mucocele as described on CT. Small mucous retention cyst in the left maxillary sinus. Clear mastoid air cells. Other: None. IMPRESSION: 1. Moderate-sized acute hemorrhagic right MCA infarct. 2. Small acute right ACA territory infarct. 3. Moderate chronic small vessel ischemic disease. Electronically Signed   By: Sebastian Ache M.D.   On: 03/25/2023 17:43   CT ANGIO HEAD NECK W WO CM  Result Date: 03/25/2023 CLINICAL DATA:  Stroke, determine embolic source EXAM: CT ANGIOGRAPHY HEAD AND NECK WITH AND WITHOUT CONTRAST TECHNIQUE: Multidetector CT imaging of the head and neck was performed using the standard protocol during bolus administration of intravenous contrast. Multiplanar CT image reconstructions and MIPs were obtained to evaluate the vascular anatomy. Carotid stenosis measurements (when applicable) are obtained utilizing NASCET criteria, using the distal internal carotid diameter as the denominator. RADIATION DOSE REDUCTION: This exam was performed according to the departmental dose-optimization program which includes automated exposure control, adjustment of the mA and/or kV according to patient size and/or use of iterative reconstruction technique. CONTRAST:  75mL OMNIPAQUE IOHEXOL 350 MG/ML SOLN COMPARISON:  Head CT from earlier today FINDINGS: CTA NECK FINDINGS Aortic arch: No acute finding.  Three vessel branching Right carotid system: Tortuous vessels. No stenosis, beading, significant atheromatous plaque, or ulceration Left carotid system: Tortuous vessels. No stenosis, beading, significant atheromatous plaque, or ulceration Vertebral arteries: No proximal subclavian stenosis. Both vertebral arteries are smoothly contoured and widely patent. Skeleton: No acute finding Other neck: No acute finding Upper chest: Enlarged main pulmonary artery, although partially covered at least 5.5 cm in diameter and  compatible with pulmonary hypertension. Review of the MIP images confirms the above findings CTA HEAD FINDINGS Anterior circulation: Atheromatous calcification of the carotid siphons. No branch occlusion, aneurysm, or spot sign seen underlying the right cerebral hemorrhage. Moderate left A2 segment stenosis. Posterior circulation: The vertebral and basilar arteries are smoothly contoured and widely patent with mild atheromatous plaque bilaterally. Atheromatous irregularity of the bilateral PCA without proximal and flow reducing stenosis. Venous sinuses: Unremarkable Anatomic variants: None significant Review of the MIP images confirms the above findings IMPRESSION: 1. No emergent vascular finding. No branch occlusion or vascular lesion seen underlying the right cerebral hemorrhage. The dural sinuses are also patent. 2. Atherosclerosis without flow limiting stenosis of major arteries in the head and neck. 3. Large main pulmonary artery as seen with pulmonary hypertension. Electronically Signed   By: Tiburcio Pea M.D.   On: 03/25/2023 09:38   DG Elbow Complete Left  Result Date: 03/25/2023 CLINICAL DATA:  Fall. Possible radial head fracture seen on forearm x-rays. EXAM: LEFT ELBOW - COMPLETE 3+ VIEW COMPARISON:  Left forearm x-rays from same day. FINDINGS: There is no evidence of fracture, dislocation, or joint effusion. Mild degenerative changes. Soft tissues are unremarkable. IMPRESSION: 1. No acute osseous abnormality. Electronically Signed   By: Obie Dredge M.D.   On: 03/25/2023 08:51  CT Head Wo Contrast  Result Date: 03/25/2023 CLINICAL DATA:  Blunt facial trauma.  Fall with head injury EXAM: CT HEAD WITHOUT CONTRAST CT MAXILLOFACIAL WITHOUT CONTRAST CT CERVICAL SPINE WITHOUT CONTRAST TECHNIQUE: Multidetector CT imaging of the head, cervical spine, and maxillofacial structures were performed using the standard protocol without intravenous contrast. Multiplanar CT image reconstructions of the  cervical spine and maxillofacial structures were also generated. RADIATION DOSE REDUCTION: This exam was performed according to the departmental dose-optimization program which includes automated exposure control, adjustment of the mA and/or kV according to patient size and/or use of iterative reconstruction technique. COMPARISON:  Head CT 01/30/2016 FINDINGS: CT HEAD FINDINGS Brain: Patchy hemorrhage in the right parietal to posterior frontal lobe. Patchy nature makes accurate volumetric calculation difficult, the largest high-density pocket of blood measures up to 17 mm, the entire hemorrhagic area spans nearly 5 cm. The adjacent brain has the appearance of cytotoxic edema including at the upper insula, suspect this is infarct related hemorrhage rather than primary traumatic hemorrhage. Brain atrophy especially affecting the cerebellum. No hydrocephalus or midline shift Vascular: No hyperdense vessel or unexpected calcification. Skull: No acute finding CT MAXILLOFACIAL FINDINGS Osseous: No evidence of fracture or mandibular dislocation. Orbits: No evidence of injury Sinuses: 2 opacified right posterior ethmoid air cells with mild expansion and scalloping. 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 Soft tissues: No acute finding CT CERVICAL SPINE FINDINGS Alignment: No traumatic malalignment Skull base and vertebrae: No acute fracture Soft tissues and spinal canal: No prevertebral fluid or swelling. No visible canal hematoma. 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 Upper chest: Negative These results were called by telephone at the time of interpretation on 03/25/2023 at 5:47 am to provider DAVID [facility] , who verbally acknowledged these results. IMPRESSION: 1. Patchy hemorrhage centered in the right parietal lobe. History of fall but signs of cytotoxic edema at the margins, favor hemorrhagic infarct over contusion 2. Negative for facial  or cervical spine fracture. Electronically Signed   By: Tiburcio Pea M.D.   On: 03/25/2023 05:48   CT Cervical Spine Wo Contrast  Result Date: 03/25/2023 CLINICAL DATA:  Blunt facial trauma.  Fall with head injury EXAM: CT HEAD WITHOUT CONTRAST CT MAXILLOFACIAL WITHOUT CONTRAST CT CERVICAL SPINE WITHOUT CONTRAST TECHNIQUE: Multidetector CT imaging of the head, cervical spine, and maxillofacial structures were performed using the standard protocol without intravenous contrast. Multiplanar CT image reconstructions of the cervical spine and maxillofacial structures were also generated. RADIATION DOSE REDUCTION: This exam was performed according to the departmental dose-optimization program which includes automated exposure control, adjustment of the mA and/or kV according to patient size and/or use of iterative reconstruction technique. COMPARISON:  Head CT 01/30/2016 FINDINGS: CT HEAD FINDINGS Brain: Patchy hemorrhage in the right parietal to posterior frontal lobe. Patchy nature makes accurate volumetric calculation difficult, the largest high-density pocket of blood measures up to 17 mm, the entire hemorrhagic area spans nearly 5 cm. The adjacent brain has the appearance of cytotoxic edema including at the upper insula, suspect this is infarct related hemorrhage rather than primary traumatic hemorrhage. Brain atrophy especially affecting the cerebellum. No hydrocephalus or midline shift Vascular: No hyperdense vessel or unexpected calcification. Skull: No acute finding CT MAXILLOFACIAL FINDINGS Osseous: No evidence of fracture or mandibular dislocation. Orbits: No evidence of injury Sinuses: 2 opacified right posterior ethmoid air cells with mild expansion and scalloping. 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 Soft tissues:  No acute finding CT CERVICAL SPINE FINDINGS Alignment: No traumatic malalignment Skull base and vertebrae: No acute fracture Soft tissues and spinal canal: No  prevertebral fluid or swelling. No visible canal hematoma. 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 Upper chest: Negative These results were called by telephone at the time of interpretation on 03/25/2023 at 5:47 am to provider DAVID [facility] , who verbally acknowledged these results. IMPRESSION: 1. Patchy hemorrhage centered in the right parietal lobe. History of fall but signs of cytotoxic edema at the margins, favor hemorrhagic infarct over contusion 2. Negative for facial or cervical spine fracture. Electronically Signed   By: Tiburcio Pea M.D.   On: 03/25/2023 05:48   CT Maxillofacial Wo Contrast  Result Date: 03/25/2023 CLINICAL DATA:  Blunt facial trauma.  Fall with head injury EXAM: CT HEAD WITHOUT CONTRAST CT MAXILLOFACIAL WITHOUT CONTRAST CT CERVICAL SPINE WITHOUT CONTRAST TECHNIQUE: Multidetector CT imaging of the head, cervical spine, and maxillofacial structures were performed using the standard protocol without intravenous contrast. Multiplanar CT image reconstructions of the cervical spine and maxillofacial structures were also generated. RADIATION DOSE REDUCTION: This exam was performed according to the departmental dose-optimization program which includes automated exposure control, adjustment of the mA and/or kV according to patient size and/or use of iterative reconstruction technique. COMPARISON:  Head CT 01/30/2016 FINDINGS: CT HEAD FINDINGS Brain: Patchy hemorrhage in the right parietal to posterior frontal lobe. Patchy nature makes accurate volumetric calculation difficult, the largest high-density pocket of blood measures up to 17 mm, the entire hemorrhagic area spans nearly 5 cm. The adjacent brain has the appearance of cytotoxic edema including at the upper insula, suspect this is infarct related hemorrhage rather than primary traumatic hemorrhage. Brain atrophy especially affecting the cerebellum. No hydrocephalus or midline shift  Vascular: No hyperdense vessel or unexpected calcification. Skull: No acute finding CT MAXILLOFACIAL FINDINGS Osseous: No evidence of fracture or mandibular dislocation. Orbits: No evidence of injury Sinuses: 2 opacified right posterior ethmoid air cells with mild expansion and scalloping. 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 Soft tissues: No acute finding CT CERVICAL SPINE FINDINGS Alignment: No traumatic malalignment Skull base and vertebrae: No acute fracture Soft tissues and spinal canal: No prevertebral fluid or swelling. No visible canal hematoma. 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 Upper chest: Negative These results were called by telephone at the time of interpretation on 03/25/2023 at 5:47 am to provider DAVID [facility] , who verbally acknowledged these results. IMPRESSION: 1. Patchy hemorrhage centered in the right parietal lobe. History of fall but signs of cytotoxic edema at the margins, favor hemorrhagic infarct over contusion 2. Negative for facial or cervical spine fracture. Electronically Signed   By: Tiburcio Pea M.D.   On: 03/25/2023 05:48   DG Forearm Left  Result Date: 03/25/2023 CLINICAL DATA:  Fall from bed with left forearm pain EXAM: LEFT FOREARM - 2 VIEW COMPARISON:  None Available. FINDINGS: There is suggestion of a nondisplaced radial head fracture on the frontal view. No malalignment. Postoperative distal forearm. Osteopenia and arterial calcification. IMPRESSION: Possible nondisplaced radial head fracture, recommend dedicated elbow views. Electronically Signed   By: Tiburcio Pea M.D.   On: 03/25/2023 05:26     Discharge Instructions: Discharge Instructions     Ambulatory referral to Neurology   Complete by: As directed    An appointment is requested in approximately: 8 weeks   Call MD for:  hives   Complete by: As directed    Call MD for:  persistant dizziness or light-headedness    Complete by: As directed    Call MD for:  persistant nausea and vomiting   Complete by: As directed    Call MD for:  redness, tenderness, or signs of infection (pain, swelling, redness, odor or green/yellow discharge around incision site)   Complete by: As directed    Call MD for:  severe uncontrolled pain   Complete by: As directed    Call MD for:  temperature >100.4   Complete by: As directed    Diet - low sodium heart healthy   Complete by: As directed    Increase activity slowly   Complete by: As directed       Allison Caldwell, It was a pleasure taking care of you at [facility]. You were admitted for a brain bleed after a stroke. The stroke was likely caused by an abnormal heart rhythm called atrial fibrillation. While you were hospitalized, you received treatment for your stroke, atrial fibrillation, and other medical conditions such as high blood pressure and diabetes. You also started having seizures after the stroke, so you were started on medications to prevent more seizures. We are discharging you to SNF now that you are doing better. Please follow the following instructions.   1) Stroke: Follow-up with neurology and primary care provider outpatient. Continue with rehab at SNF.  2) Seizures: Continue taking Keppra 1500 mg twice daily, Depakote 500 mg three times daily, and Vimpat 200 mg daily. These medicines will help prevent more seizures.  3) Atrial fibrillation: Continue taking apixaban 5 mg twice daily and metoprolol succinate 100 mg daily. These medications thin your blood and slow down your heart to prevent more clots from going to your brain.  If you develop any bleeding, let your doctor know right away, and stop taking apixaban if your doctor instructs you to.  4) Delirium: Continue taking quetiapine 50 mg daily. This medicine helps with your confusion.  5) Hypertension: Continue taking amlodipine 10 mg daily, losartan 100 mg daily, and metoprolol succinate 100 mg  daily. These medicines help your blood pressure stay at a normal level.  We have discontinued your olmesartan.  6) Hyperlipidemia: Continue taking pravastatin 20 mg daily. This medication helps lower your cholesterol.  7) Diabetes: Continue glargine 10 units nightly.  Also continue taking your metformin.  This medicine helps lower your blood sugar.  Take care,  Dr. Modena Slater, DO   Signed: Modena Slater, DO 04/10/2023, 2:10 PM   Pager: [phone]

## 2023-04-10 NOTE — Discharge Instructions (Addendum)
Allison Caldwell, It was a pleasure taking care of you at [facility]. You were admitted for a brain bleed after a stroke. The stroke was likely caused by an abnormal heart rhythm called atrial fibrillation. While you were hospitalized, you received treatment for your stroke, atrial fibrillation, and other medical conditions such as high blood pressure and diabetes. You also started having seizures after the stroke, so you were started on medications to prevent more seizures. We are discharging you to SNF now that you are doing better. Please follow the following instructions.   1) Stroke: Follow-up with neurology and primary care provider outpatient. Continue with rehab at SNF.  2) Seizures: Continue taking Keppra 1500 mg twice daily, Depakote 500 mg three times daily, and Vimpat 200 mg daily. These medicines will help prevent more seizures.  3) Atrial fibrillation: Continue taking apixaban 5 mg twice daily and metoprolol succinate 100 mg daily. These medications thin your blood and slow down your heart to prevent more clots from going to your brain.  If you develop any bleeding, let your doctor know right away, and stop taking apixaban if your doctor instructs you to.  4) Delirium: Continue taking quetiapine 50 mg daily. This medicine helps with your confusion.  5) Hypertension: Continue taking amlodipine 10 mg daily, losartan 100 mg daily, and metoprolol succinate 100 mg daily. These medicines help your blood pressure stay at a normal level.  We have discontinued your olmesartan.  6) Hyperlipidemia: Continue taking pravastatin 20 mg daily. This medication helps lower your cholesterol.  7) Diabetes: Continue glargine 10 units nightly.  Also continue taking your metformin.  This medicine helps lower your blood sugar.  Take care,  Dr. Modena Slater, DO

## 2023-04-10 NOTE — Progress Notes (Signed)
Patient refused to have VS check at this time.

## 2023-04-11 ENCOUNTER — Other Ambulatory Visit (HOSPITAL_COMMUNITY): Payer: Self-pay

## 2023-04-11 DIAGNOSIS — I272 Pulmonary hypertension, unspecified: Secondary | ICD-10-CM | POA: Diagnosis not present

## 2023-04-11 DIAGNOSIS — M25552 Pain in left hip: Secondary | ICD-10-CM | POA: Diagnosis not present

## 2023-04-11 DIAGNOSIS — M25551 Pain in right hip: Secondary | ICD-10-CM | POA: Diagnosis not present

## 2023-04-11 DIAGNOSIS — M79651 Pain in right thigh: Secondary | ICD-10-CM | POA: Diagnosis not present

## 2023-04-13 DIAGNOSIS — I69354 Hemiplegia and hemiparesis following cerebral infarction affecting left non-dominant side: Secondary | ICD-10-CM | POA: Diagnosis not present

## 2023-04-13 DIAGNOSIS — M6281 Muscle weakness (generalized): Secondary | ICD-10-CM | POA: Diagnosis not present

## 2023-04-13 DIAGNOSIS — I480 Paroxysmal atrial fibrillation: Secondary | ICD-10-CM | POA: Diagnosis not present

## 2023-04-13 DIAGNOSIS — Z9181 History of falling: Secondary | ICD-10-CM | POA: Diagnosis not present

## 2023-04-13 DIAGNOSIS — G40919 Epilepsy, unspecified, intractable, without status epilepticus: Secondary | ICD-10-CM | POA: Diagnosis not present

## 2023-04-13 DIAGNOSIS — I69391 Dysphagia following cerebral infarction: Secondary | ICD-10-CM | POA: Diagnosis not present

## 2023-04-13 DIAGNOSIS — R2681 Unsteadiness on feet: Secondary | ICD-10-CM | POA: Diagnosis not present

## 2023-04-14 DIAGNOSIS — I619 Nontraumatic intracerebral hemorrhage, unspecified: Secondary | ICD-10-CM | POA: Diagnosis not present

## 2023-04-14 DIAGNOSIS — G4089 Other seizures: Secondary | ICD-10-CM | POA: Diagnosis not present

## 2023-04-14 DIAGNOSIS — I4891 Unspecified atrial fibrillation: Secondary | ICD-10-CM | POA: Diagnosis not present

## 2023-04-14 DIAGNOSIS — I10 Essential (primary) hypertension: Secondary | ICD-10-CM | POA: Diagnosis not present

## 2023-04-17 DIAGNOSIS — Z9181 History of falling: Secondary | ICD-10-CM | POA: Diagnosis not present

## 2023-04-17 DIAGNOSIS — G40919 Epilepsy, unspecified, intractable, without status epilepticus: Secondary | ICD-10-CM | POA: Diagnosis not present

## 2023-04-17 DIAGNOSIS — I69391 Dysphagia following cerebral infarction: Secondary | ICD-10-CM | POA: Diagnosis not present

## 2023-04-17 DIAGNOSIS — I10 Essential (primary) hypertension: Secondary | ICD-10-CM | POA: Diagnosis not present

## 2023-04-17 DIAGNOSIS — M6281 Muscle weakness (generalized): Secondary | ICD-10-CM | POA: Diagnosis not present

## 2023-04-17 DIAGNOSIS — I480 Paroxysmal atrial fibrillation: Secondary | ICD-10-CM | POA: Diagnosis not present

## 2023-04-17 DIAGNOSIS — R2681 Unsteadiness on feet: Secondary | ICD-10-CM | POA: Diagnosis not present

## 2023-04-17 DIAGNOSIS — I69354 Hemiplegia and hemiparesis following cerebral infarction affecting left non-dominant side: Secondary | ICD-10-CM | POA: Diagnosis not present

## 2023-04-18 ENCOUNTER — Encounter (HOSPITAL_COMMUNITY): Payer: Self-pay

## 2023-04-18 ENCOUNTER — Emergency Department (HOSPITAL_COMMUNITY): Payer: Medicare HMO

## 2023-04-18 ENCOUNTER — Emergency Department (HOSPITAL_COMMUNITY)
Admission: EM | Admit: 2023-04-18 | Discharge: 2023-04-19 | Disposition: A | Discharge status: Home / Self Care | Payer: Medicare HMO | Attending: Emergency Medicine | Admitting: Emergency Medicine

## 2023-04-18 ENCOUNTER — Other Ambulatory Visit: Payer: Self-pay

## 2023-04-18 DIAGNOSIS — E669 Obesity, unspecified: Secondary | ICD-10-CM | POA: Diagnosis not present

## 2023-04-18 DIAGNOSIS — I119 Hypertensive heart disease without heart failure: Secondary | ICD-10-CM | POA: Diagnosis not present

## 2023-04-18 DIAGNOSIS — Z7984 Long term (current) use of oral hypoglycemic drugs: Secondary | ICD-10-CM | POA: Diagnosis not present

## 2023-04-18 DIAGNOSIS — Z8673 Personal history of transient ischemic attack (TIA), and cerebral infarction without residual deficits: Secondary | ICD-10-CM | POA: Diagnosis not present

## 2023-04-18 DIAGNOSIS — Z7901 Long term (current) use of anticoagulants: Secondary | ICD-10-CM | POA: Insufficient documentation

## 2023-04-18 DIAGNOSIS — T17908A Unspecified foreign body in respiratory tract, part unspecified causing other injury, initial encounter: Secondary | ICD-10-CM

## 2023-04-18 DIAGNOSIS — I10 Essential (primary) hypertension: Secondary | ICD-10-CM | POA: Diagnosis not present

## 2023-04-18 DIAGNOSIS — Z7951 Long term (current) use of inhaled steroids: Secondary | ICD-10-CM | POA: Insufficient documentation

## 2023-04-18 DIAGNOSIS — J029 Acute pharyngitis, unspecified: Secondary | ICD-10-CM | POA: Diagnosis not present

## 2023-04-18 DIAGNOSIS — Z1152 Encounter for screening for COVID-19: Secondary | ICD-10-CM | POA: Diagnosis not present

## 2023-04-18 DIAGNOSIS — X58XXXA Exposure to other specified factors, initial encounter: Secondary | ICD-10-CM | POA: Diagnosis not present

## 2023-04-18 DIAGNOSIS — E119 Type 2 diabetes mellitus without complications: Secondary | ICD-10-CM | POA: Diagnosis not present

## 2023-04-18 DIAGNOSIS — Z794 Long term (current) use of insulin: Secondary | ICD-10-CM | POA: Insufficient documentation

## 2023-04-18 DIAGNOSIS — R001 Bradycardia, unspecified: Secondary | ICD-10-CM | POA: Diagnosis not present

## 2023-04-18 DIAGNOSIS — I4891 Unspecified atrial fibrillation: Secondary | ICD-10-CM | POA: Diagnosis not present

## 2023-04-18 DIAGNOSIS — R059 Cough, unspecified: Secondary | ICD-10-CM | POA: Diagnosis not present

## 2023-04-18 DIAGNOSIS — I517 Cardiomegaly: Secondary | ICD-10-CM | POA: Diagnosis not present

## 2023-04-18 DIAGNOSIS — Z79899 Other long term (current) drug therapy: Secondary | ICD-10-CM | POA: Insufficient documentation

## 2023-04-18 DIAGNOSIS — T17920A Food in respiratory tract, part unspecified causing asphyxiation, initial encounter: Secondary | ICD-10-CM | POA: Insufficient documentation

## 2023-04-18 DIAGNOSIS — T17900A Unspecified foreign body in respiratory tract, part unspecified causing asphyxiation, initial encounter: Secondary | ICD-10-CM | POA: Diagnosis not present

## 2023-04-18 DIAGNOSIS — R1310 Dysphagia, unspecified: Secondary | ICD-10-CM | POA: Diagnosis not present

## 2023-04-18 LAB — CBC
HCT: 42 % (ref 36.0–46.0)
Hemoglobin: 14 g/dL (ref 12.0–15.0)
MCH: 30.6 pg (ref 26.0–34.0)
MCHC: 33.3 g/dL (ref 30.0–36.0)
MCV: 91.9 fL (ref 80.0–100.0)
Platelets: 200 10*3/uL (ref 150–400)
RBC: 4.57 MIL/uL (ref 3.87–5.11)
RDW: 15.4 % (ref 11.5–15.5)
WBC: 7.8 10*3/uL (ref 4.0–10.5)
nRBC: 0 % (ref 0.0–0.2)

## 2023-04-18 LAB — RESP PANEL BY RT-PCR (RSV, FLU A&B, COVID)  RVPGX2
Influenza A by PCR: NEGATIVE
Influenza B by PCR: NEGATIVE
Resp Syncytial Virus by PCR: NEGATIVE
SARS Coronavirus 2 by RT PCR: NEGATIVE

## 2023-04-18 LAB — BASIC METABOLIC PANEL
Anion gap: 13 (ref 5–15)
BUN: 26 mg/dL — ABNORMAL HIGH (ref 8–23)
CO2: 26 mmol/L (ref 22–32)
Calcium: 9.1 mg/dL (ref 8.9–10.3)
Chloride: 98 mmol/L (ref 98–111)
Creatinine, Ser: 0.99 mg/dL (ref 0.44–1.00)
GFR, Estimated: 58 mL/min — ABNORMAL LOW (ref >60)
Glucose, Bld: 136 mg/dL — ABNORMAL HIGH (ref 70–99)
Potassium: 3.6 mmol/L (ref 3.5–5.1)
Sodium: 137 mmol/L (ref 135–145)

## 2023-04-18 LAB — AMMONIA: Ammonia: 12 umol/L (ref 9–35)

## 2023-04-18 MED ORDER — IOHEXOL 350 MG/ML SOLN
75.0000 mL | Freq: Once | INTRAVENOUS | Status: AC | PRN
  Administered 2023-04-18: 75 mL via INTRAVENOUS

## 2023-04-18 NOTE — ED Notes (Signed)
Completed swallow screen. Patient was able to swallow sips of water but unable to continuously drink. She stated if she did she will vomit.

## 2023-04-18 NOTE — ED Provider Notes (Signed)
[place] EMERGENCY DEPARTMENT AT [facility] Provider Note   CSN: [id number] Arrival date & time: 04/18/23  1628     History {Add pertinent medical, surgical, social history, OB history to HPI:1} Chief Complaint  Patient presents with   Cough    Allison Caldwell is a 77 y.o. female.  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  History obtained per the patient and her facility states that the patient was eating this morning at approximately 10 AM when she started to cough.  They suspect that she may have aspirated something and referred her to the emergency department.  She denies any shortness of breath.  Says that she has a tickle in her throat.  No fevers and has not been sick at all otherwise.  No difficulty swallowing or voice changes since the stroke.  No double vision.       Home Medications Prior to Admission medications   Medication Sig Start Date End Date Taking? Authorizing Provider  Accu-Chek FastClix Lancets MISC Check blood sugar 1 time a day 09/28/20   Belva Agee, MD  Accu-Chek Softclix Lancets lancets Use as directed up to 4 times daily. 09/26/22   Gwenevere Abbot, MD  albuterol (VENTOLIN HFA) 108 (90 Base) MCG/ACT inhaler Inhale 2 puffs into the lungs every 6 (six) hours as needed for wheezing or shortness of breath. Patient not taking: Reported on 03/25/2023 09/26/22   Gwenevere Abbot, MD  amLODipine (NORVASC) 10 MG tablet Take 1 tablet (10 mg total) by mouth daily. 04/11/23   Modena Slater, DO  apixaban (ELIQUIS) 5 MG TABS tablet Take 1 tablet (5 mg total) by mouth 2 (two) times daily. 04/10/23   Modena Slater, DO  blood glucose meter kit and supplies KIT Use up to four times daily as directed. 09/26/22   Gwenevere Abbot, MD  Blood Glucose Monitoring Suppl  (ACCU-CHEK GUIDE) w/Device KIT 1 each by Does not apply route daily. Check blood sugar 1 time a day 01/31/19   Levora Dredge, MD  feeding supplement (ENSURE ENLIVE / ENSURE PLUS) LIQD Take 237 mLs by mouth 2 (two) times daily between meals. 04/10/23   Modena Slater, DO  Fluticasone-Umeclidin-Vilant (TRELEGY ELLIPTA) 100-62.5-25 MCG/ACT AEPB Inhale 1 puff into the lungs daily. Patient not taking: Reported on 03/25/2023 09/26/22   Gwenevere Abbot, MD  glucose blood (ACCU-CHEK GUIDE) test strip Check blood sugar 1 time per day 09/28/20   Belva Agee, MD  glucose blood test strip Use as directed up to four times daily 09/26/22   Gwenevere Abbot, MD  insulin glargine-yfgn [facility]) 100 UNIT/ML injection Inject 0.1 mLs (10 Units total) into the skin daily. 04/10/23   Modena Slater, DO  Insulin Pen Needle 31G X 8 MM MISC Use 4 (four) times daily with insulin pens. 09/26/22   Gwenevere Abbot, MD  lacosamide (VIMPAT) 200 MG TABS tablet Take 1 tablet (200 mg total) by mouth 2 (two) times daily. 04/10/23   Modena Slater, DO  levETIRAcetam (KEPPRA) 750 MG tablet Take 2 tablets (1,500 mg total) by mouth 2 (two) times daily. 04/10/23   Modena Slater, DO  losartan (COZAAR) 100 MG tablet Take 1 tablet (100 mg total) by mouth daily. 04/11/23   Modena Slater, DO  melatonin 3 MG TABS tablet Take 1 tablet (3 mg total) by mouth at bedtime. 04/10/23   Modena Slater, DO  metFORMIN (  GLUCOPHAGE) 500 MG tablet Take 1 tablet (500 mg total) by mouth 2 (two) times daily with a meal. 07/28/22   Katsadouros, Vasilios, MD  metoprolol succinate (TOPROL-XL) 100 MG 24 hr tablet Take 1 tablet (100 mg total) by mouth daily. Take with or immediately following a meal. 04/11/23   Modena Slater, DO  Multiple Vitamin (MULTIVITAMIN WITH MINERALS) TABS tablet Take 1 tablet by mouth daily. 05/05/16   Funches, Gerilyn Nestle, MD  Nutritional Supplements (NUTRA/SHAKE) (Frozen) LIQD Take 1 Bottle by mouth 2 (two) times daily. 04/10/23   Modena Slater, DO  pravastatin (PRAVACHOL)  20 MG tablet TAKE 1 TABLET AT BEDTIME Patient taking differently: Take 20 mg by mouth daily. 03/09/22   Katsadouros, Vasilios, MD  QUEtiapine (SEROQUEL) 50 MG tablet Take 1 tablet (50 mg total) by mouth daily. 04/11/23   Modena Slater, DO  sertraline (ZOLOFT) 100 MG tablet Take 1 tablet (100 mg total) by mouth daily. 07/28/22   Katsadouros, Vasilios, MD  TIADYLT ER 420 MG 24 hr capsule TAKE 1 CAPSULE EVERY DAY Patient taking differently: Take 420 mg by mouth daily. 01/06/22   Belva Agee, MD  valproic acid (DEPAKENE) 250 MG/5ML solution Take 10 mLs (500 mg total) by mouth every 8 (eight) hours. 04/10/23   Modena Slater, DO      Allergies    Ace inhibitors and Penicillins    Review of Systems   Review of Systems  Physical Exam Updated Vital Signs BP (!) 149/137 (BP Location: Right Arm)   Pulse (!) 53   Temp 98.3 F (36.8 C) (Oral)   Resp 14   Ht 5\' 4"  (1.626 m)   Wt 128 kg   SpO2 96%   BMI 48.44 kg/m  Physical Exam Vitals and nursing note reviewed.  Constitutional:      General: She is not in acute distress.    Appearance: She is well-developed.     Comments: Coughing frequently during exam.  HENT:     Head: Normocephalic and atraumatic.     Right Ear: External ear normal.     Left Ear: External ear normal.     Nose: Nose normal.     Mouth/Throat:     Mouth: Mucous membranes are moist.     Pharynx: Oropharynx is clear. No oropharyngeal exudate or posterior oropharyngeal erythema.  Eyes:     Extraocular Movements: Extraocular movements intact.     Conjunctiva/sclera: Conjunctivae normal.     Pupils: Pupils are equal, round, and reactive to light.  Cardiovascular:     Rate and Rhythm: Normal rate and regular rhythm.     Heart sounds: No murmur heard. Pulmonary:     Effort: Pulmonary effort is normal. No respiratory distress.     Breath sounds: Normal breath sounds.  Musculoskeletal:     Cervical back: Normal range of motion and neck supple.  Skin:    General: Skin  is warm and dry.  Neurological:     Mental Status: She is alert and oriented to person, place, and time. Mental status is at baseline.  Psychiatric:        Mood and Affect: Mood normal.     ED Results / Procedures / Treatments   Labs (all labs ordered are listed, but only abnormal results are displayed) Labs Reviewed - No data to display  EKG None  Radiology No results found.  Procedures Procedures  {Document cardiac monitor, telemetry assessment procedure when appropriate:1}  Medications Ordered in ED Medications - No data to display  ED  Course/ Medical Decision Making/ A&P   {   Click here for ABCD2, HEART and other calculatorsREFRESH Note before signing :1}                          Medical Decision Making Amount and/or Complexity of Data Reviewed Labs: ordered. Radiology: ordered.   ***  {Document critical care time when appropriate:1} {Document review of labs and clinical decision tools ie heart score, Chads2Vasc2 etc:1}  {Document your independent review of radiology images, and any outside records:1} {Document your discussion with family members, caretakers, and with consultants:1} {Document social determinants of health affecting pt's care:1} {Document your decision making why or why not admission, treatments were needed:1} Final Clinical Impression(s) / ED Diagnoses Final diagnoses:  None    Rx / DC Orders ED Discharge Orders     None

## 2023-04-18 NOTE — ED Triage Notes (Signed)
Pt BIBEMS per EMS pt had stroke 3 weeks ago and was dc/d recently to rehab and for around 9 hours today pt has had a non-productive cough. Facility is concerned for possible aspiration and wanted pt to come in for eval. VSS/NAD, non-productive cough noted but pt denies any DIB or pain.

## 2023-04-19 DIAGNOSIS — G40919 Epilepsy, unspecified, intractable, without status epilepticus: Secondary | ICD-10-CM | POA: Diagnosis not present

## 2023-04-19 DIAGNOSIS — R059 Cough, unspecified: Secondary | ICD-10-CM | POA: Diagnosis not present

## 2023-04-19 DIAGNOSIS — J690 Pneumonitis due to inhalation of food and vomit: Secondary | ICD-10-CM | POA: Diagnosis not present

## 2023-04-19 DIAGNOSIS — Z7401 Bed confinement status: Secondary | ICD-10-CM | POA: Diagnosis not present

## 2023-04-19 MED ORDER — ZONISAMIDE 100 MG PO CAPS: ORAL_CAPSULE | ORAL | 0 refills | Status: DC

## 2023-04-19 MED ORDER — CEFDINIR 300 MG PO CAPS: 300.0000 mg | ORAL_CAPSULE | Freq: Two times a day (BID) | ORAL | 0 refills | Status: AC

## 2023-04-19 MED ORDER — DOXYCYCLINE HYCLATE 100 MG PO CAPS: 100.0000 mg | ORAL_CAPSULE | Freq: Two times a day (BID) | ORAL | 0 refills | Status: AC

## 2023-04-19 NOTE — Discharge Instructions (Addendum)
You were seen for cough in the emergency department.   At home, please take the antibiotics so that you do not develop a pneumonia.  Please stop taking the Keppra since it might be related to your hallucinations and behavior.  Start taking zonisamide 100 mg at bedtime for 2 weeks and then 200 mg at bedtime afterwards.  Check your MyChart online for the results of any tests that had not resulted by the time you left the emergency department.   Follow-up with your primary doctor in 2-3 days regarding your visit.  Follow-up with your neurologist as soon as possible  Return immediately to the emergency department if you experience any of the following: difficulty breathing, fever, or any other concerning symptoms.    Thank you for visiting our Emergency Department. It was a pleasure taking care of you today.

## 2023-04-20 ENCOUNTER — Encounter: Payer: Medicare HMO | Admitting: Student

## 2023-04-20 DIAGNOSIS — R2681 Unsteadiness on feet: Secondary | ICD-10-CM | POA: Diagnosis not present

## 2023-04-20 DIAGNOSIS — I480 Paroxysmal atrial fibrillation: Secondary | ICD-10-CM | POA: Diagnosis not present

## 2023-04-20 DIAGNOSIS — Z9181 History of falling: Secondary | ICD-10-CM | POA: Diagnosis not present

## 2023-04-20 DIAGNOSIS — I69354 Hemiplegia and hemiparesis following cerebral infarction affecting left non-dominant side: Secondary | ICD-10-CM | POA: Diagnosis not present

## 2023-04-20 DIAGNOSIS — M6281 Muscle weakness (generalized): Secondary | ICD-10-CM | POA: Diagnosis not present

## 2023-04-20 DIAGNOSIS — I69391 Dysphagia following cerebral infarction: Secondary | ICD-10-CM | POA: Diagnosis not present

## 2023-04-20 DIAGNOSIS — G40919 Epilepsy, unspecified, intractable, without status epilepticus: Secondary | ICD-10-CM | POA: Diagnosis not present

## 2023-04-21 DIAGNOSIS — Z9181 History of falling: Secondary | ICD-10-CM | POA: Diagnosis not present

## 2023-04-21 DIAGNOSIS — G4089 Other seizures: Secondary | ICD-10-CM | POA: Diagnosis not present

## 2023-04-24 DIAGNOSIS — I69354 Hemiplegia and hemiparesis following cerebral infarction affecting left non-dominant side: Secondary | ICD-10-CM | POA: Diagnosis not present

## 2023-04-24 DIAGNOSIS — M6281 Muscle weakness (generalized): Secondary | ICD-10-CM | POA: Diagnosis not present

## 2023-04-24 DIAGNOSIS — G40919 Epilepsy, unspecified, intractable, without status epilepticus: Secondary | ICD-10-CM | POA: Diagnosis not present

## 2023-04-24 DIAGNOSIS — R2681 Unsteadiness on feet: Secondary | ICD-10-CM | POA: Diagnosis not present

## 2023-04-24 DIAGNOSIS — I480 Paroxysmal atrial fibrillation: Secondary | ICD-10-CM | POA: Diagnosis not present

## 2023-04-24 DIAGNOSIS — I69391 Dysphagia following cerebral infarction: Secondary | ICD-10-CM | POA: Diagnosis not present

## 2023-04-24 DIAGNOSIS — Z9181 History of falling: Secondary | ICD-10-CM | POA: Diagnosis not present

## 2023-04-24 DIAGNOSIS — J690 Pneumonitis due to inhalation of food and vomit: Secondary | ICD-10-CM | POA: Diagnosis not present

## 2023-04-24 DIAGNOSIS — J449 Chronic obstructive pulmonary disease, unspecified: Secondary | ICD-10-CM | POA: Diagnosis not present

## 2023-04-27 DIAGNOSIS — Z9181 History of falling: Secondary | ICD-10-CM | POA: Diagnosis not present

## 2023-04-27 DIAGNOSIS — I69391 Dysphagia following cerebral infarction: Secondary | ICD-10-CM | POA: Diagnosis not present

## 2023-04-27 DIAGNOSIS — I480 Paroxysmal atrial fibrillation: Secondary | ICD-10-CM | POA: Diagnosis not present

## 2023-04-27 DIAGNOSIS — R2681 Unsteadiness on feet: Secondary | ICD-10-CM | POA: Diagnosis not present

## 2023-04-27 DIAGNOSIS — M6281 Muscle weakness (generalized): Secondary | ICD-10-CM | POA: Diagnosis not present

## 2023-04-27 DIAGNOSIS — G40919 Epilepsy, unspecified, intractable, without status epilepticus: Secondary | ICD-10-CM | POA: Diagnosis not present

## 2023-04-27 DIAGNOSIS — I69354 Hemiplegia and hemiparesis following cerebral infarction affecting left non-dominant side: Secondary | ICD-10-CM | POA: Diagnosis not present

## 2023-04-30 DIAGNOSIS — E1165 Type 2 diabetes mellitus with hyperglycemia: Secondary | ICD-10-CM | POA: Diagnosis not present

## 2023-04-30 DIAGNOSIS — I480 Paroxysmal atrial fibrillation: Secondary | ICD-10-CM | POA: Diagnosis not present

## 2023-04-30 DIAGNOSIS — W06XXXA Fall from bed, initial encounter: Secondary | ICD-10-CM | POA: Diagnosis not present

## 2023-05-01 DIAGNOSIS — M6281 Muscle weakness (generalized): Secondary | ICD-10-CM | POA: Diagnosis not present

## 2023-05-01 DIAGNOSIS — I69391 Dysphagia following cerebral infarction: Secondary | ICD-10-CM | POA: Diagnosis not present

## 2023-05-01 DIAGNOSIS — Z9181 History of falling: Secondary | ICD-10-CM | POA: Diagnosis not present

## 2023-05-01 DIAGNOSIS — I69354 Hemiplegia and hemiparesis following cerebral infarction affecting left non-dominant side: Secondary | ICD-10-CM | POA: Diagnosis not present

## 2023-05-01 DIAGNOSIS — I480 Paroxysmal atrial fibrillation: Secondary | ICD-10-CM | POA: Diagnosis not present

## 2023-05-01 DIAGNOSIS — R2681 Unsteadiness on feet: Secondary | ICD-10-CM | POA: Diagnosis not present

## 2023-05-01 DIAGNOSIS — G40919 Epilepsy, unspecified, intractable, without status epilepticus: Secondary | ICD-10-CM | POA: Diagnosis not present

## 2023-05-02 DIAGNOSIS — I619 Nontraumatic intracerebral hemorrhage, unspecified: Secondary | ICD-10-CM | POA: Diagnosis not present

## 2023-05-02 DIAGNOSIS — I480 Paroxysmal atrial fibrillation: Secondary | ICD-10-CM | POA: Diagnosis not present

## 2023-05-02 DIAGNOSIS — J449 Chronic obstructive pulmonary disease, unspecified: Secondary | ICD-10-CM | POA: Diagnosis not present

## 2023-05-02 DIAGNOSIS — M6281 Muscle weakness (generalized): Secondary | ICD-10-CM | POA: Diagnosis not present

## 2023-05-05 DIAGNOSIS — F419 Anxiety disorder, unspecified: Secondary | ICD-10-CM | POA: Diagnosis not present

## 2023-05-05 DIAGNOSIS — I619 Nontraumatic intracerebral hemorrhage, unspecified: Secondary | ICD-10-CM | POA: Diagnosis not present

## 2023-05-05 DIAGNOSIS — G4089 Other seizures: Secondary | ICD-10-CM | POA: Diagnosis not present

## 2023-05-08 DIAGNOSIS — M6281 Muscle weakness (generalized): Secondary | ICD-10-CM | POA: Diagnosis not present

## 2023-05-08 DIAGNOSIS — I69354 Hemiplegia and hemiparesis following cerebral infarction affecting left non-dominant side: Secondary | ICD-10-CM | POA: Diagnosis not present

## 2023-05-08 DIAGNOSIS — I480 Paroxysmal atrial fibrillation: Secondary | ICD-10-CM | POA: Diagnosis not present

## 2023-05-08 DIAGNOSIS — G40919 Epilepsy, unspecified, intractable, without status epilepticus: Secondary | ICD-10-CM | POA: Diagnosis not present

## 2023-05-08 DIAGNOSIS — Z9181 History of falling: Secondary | ICD-10-CM | POA: Diagnosis not present

## 2023-05-08 DIAGNOSIS — I69391 Dysphagia following cerebral infarction: Secondary | ICD-10-CM | POA: Diagnosis not present

## 2023-05-08 DIAGNOSIS — R2681 Unsteadiness on feet: Secondary | ICD-10-CM | POA: Diagnosis not present

## 2023-05-11 DIAGNOSIS — G40919 Epilepsy, unspecified, intractable, without status epilepticus: Secondary | ICD-10-CM | POA: Diagnosis not present

## 2023-05-11 DIAGNOSIS — I69391 Dysphagia following cerebral infarction: Secondary | ICD-10-CM | POA: Diagnosis not present

## 2023-05-11 DIAGNOSIS — Z9181 History of falling: Secondary | ICD-10-CM | POA: Diagnosis not present

## 2023-05-11 DIAGNOSIS — M6281 Muscle weakness (generalized): Secondary | ICD-10-CM | POA: Diagnosis not present

## 2023-05-11 DIAGNOSIS — I480 Paroxysmal atrial fibrillation: Secondary | ICD-10-CM | POA: Diagnosis not present

## 2023-05-11 DIAGNOSIS — R2681 Unsteadiness on feet: Secondary | ICD-10-CM | POA: Diagnosis not present

## 2023-05-11 DIAGNOSIS — I69354 Hemiplegia and hemiparesis following cerebral infarction affecting left non-dominant side: Secondary | ICD-10-CM | POA: Diagnosis not present

## 2023-05-15 DIAGNOSIS — R2681 Unsteadiness on feet: Secondary | ICD-10-CM | POA: Diagnosis not present

## 2023-05-15 DIAGNOSIS — M6281 Muscle weakness (generalized): Secondary | ICD-10-CM | POA: Diagnosis not present

## 2023-05-15 DIAGNOSIS — I69354 Hemiplegia and hemiparesis following cerebral infarction affecting left non-dominant side: Secondary | ICD-10-CM | POA: Diagnosis not present

## 2023-05-15 DIAGNOSIS — I69391 Dysphagia following cerebral infarction: Secondary | ICD-10-CM | POA: Diagnosis not present

## 2023-05-15 DIAGNOSIS — G40919 Epilepsy, unspecified, intractable, without status epilepticus: Secondary | ICD-10-CM | POA: Diagnosis not present

## 2023-05-15 DIAGNOSIS — Z9181 History of falling: Secondary | ICD-10-CM | POA: Diagnosis not present

## 2023-05-15 DIAGNOSIS — I480 Paroxysmal atrial fibrillation: Secondary | ICD-10-CM | POA: Diagnosis not present

## 2023-05-17 DIAGNOSIS — F419 Anxiety disorder, unspecified: Secondary | ICD-10-CM | POA: Diagnosis not present

## 2023-05-17 DIAGNOSIS — I480 Paroxysmal atrial fibrillation: Secondary | ICD-10-CM | POA: Diagnosis not present

## 2023-05-17 DIAGNOSIS — G4089 Other seizures: Secondary | ICD-10-CM | POA: Diagnosis not present

## 2023-05-17 DIAGNOSIS — E1165 Type 2 diabetes mellitus with hyperglycemia: Secondary | ICD-10-CM | POA: Diagnosis not present

## 2023-05-18 DIAGNOSIS — Z9181 History of falling: Secondary | ICD-10-CM | POA: Diagnosis not present

## 2023-05-18 DIAGNOSIS — I480 Paroxysmal atrial fibrillation: Secondary | ICD-10-CM | POA: Diagnosis not present

## 2023-05-18 DIAGNOSIS — M6281 Muscle weakness (generalized): Secondary | ICD-10-CM | POA: Diagnosis not present

## 2023-05-18 DIAGNOSIS — G40919 Epilepsy, unspecified, intractable, without status epilepticus: Secondary | ICD-10-CM | POA: Diagnosis not present

## 2023-05-18 DIAGNOSIS — R2681 Unsteadiness on feet: Secondary | ICD-10-CM | POA: Diagnosis not present

## 2023-05-18 DIAGNOSIS — I69354 Hemiplegia and hemiparesis following cerebral infarction affecting left non-dominant side: Secondary | ICD-10-CM | POA: Diagnosis not present

## 2023-05-18 DIAGNOSIS — I69391 Dysphagia following cerebral infarction: Secondary | ICD-10-CM | POA: Diagnosis not present

## 2023-05-22 DIAGNOSIS — M6281 Muscle weakness (generalized): Secondary | ICD-10-CM | POA: Diagnosis not present

## 2023-05-22 DIAGNOSIS — R2681 Unsteadiness on feet: Secondary | ICD-10-CM | POA: Diagnosis not present

## 2023-05-22 DIAGNOSIS — I480 Paroxysmal atrial fibrillation: Secondary | ICD-10-CM | POA: Diagnosis not present

## 2023-05-22 DIAGNOSIS — G40919 Epilepsy, unspecified, intractable, without status epilepticus: Secondary | ICD-10-CM | POA: Diagnosis not present

## 2023-05-22 DIAGNOSIS — I69391 Dysphagia following cerebral infarction: Secondary | ICD-10-CM | POA: Diagnosis not present

## 2023-05-22 DIAGNOSIS — Z9181 History of falling: Secondary | ICD-10-CM | POA: Diagnosis not present

## 2023-05-22 DIAGNOSIS — I69354 Hemiplegia and hemiparesis following cerebral infarction affecting left non-dominant side: Secondary | ICD-10-CM | POA: Diagnosis not present

## 2023-05-25 DIAGNOSIS — I480 Paroxysmal atrial fibrillation: Secondary | ICD-10-CM | POA: Diagnosis not present

## 2023-05-25 DIAGNOSIS — R2681 Unsteadiness on feet: Secondary | ICD-10-CM | POA: Diagnosis not present

## 2023-05-25 DIAGNOSIS — Z9181 History of falling: Secondary | ICD-10-CM | POA: Diagnosis not present

## 2023-05-25 DIAGNOSIS — G40919 Epilepsy, unspecified, intractable, without status epilepticus: Secondary | ICD-10-CM | POA: Diagnosis not present

## 2023-05-25 DIAGNOSIS — I69391 Dysphagia following cerebral infarction: Secondary | ICD-10-CM | POA: Diagnosis not present

## 2023-05-25 DIAGNOSIS — I69354 Hemiplegia and hemiparesis following cerebral infarction affecting left non-dominant side: Secondary | ICD-10-CM | POA: Diagnosis not present

## 2023-05-25 DIAGNOSIS — M6281 Muscle weakness (generalized): Secondary | ICD-10-CM | POA: Diagnosis not present

## 2023-05-29 DIAGNOSIS — E1165 Type 2 diabetes mellitus with hyperglycemia: Secondary | ICD-10-CM | POA: Diagnosis not present

## 2023-05-29 DIAGNOSIS — G40919 Epilepsy, unspecified, intractable, without status epilepticus: Secondary | ICD-10-CM | POA: Diagnosis not present

## 2023-05-29 DIAGNOSIS — I69354 Hemiplegia and hemiparesis following cerebral infarction affecting left non-dominant side: Secondary | ICD-10-CM | POA: Diagnosis not present

## 2023-05-29 DIAGNOSIS — I480 Paroxysmal atrial fibrillation: Secondary | ICD-10-CM | POA: Diagnosis not present

## 2023-05-29 DIAGNOSIS — Z9181 History of falling: Secondary | ICD-10-CM | POA: Diagnosis not present

## 2023-05-29 DIAGNOSIS — M6281 Muscle weakness (generalized): Secondary | ICD-10-CM | POA: Diagnosis not present

## 2023-05-29 DIAGNOSIS — R2681 Unsteadiness on feet: Secondary | ICD-10-CM | POA: Diagnosis not present

## 2023-05-29 DIAGNOSIS — R52 Pain, unspecified: Secondary | ICD-10-CM | POA: Diagnosis not present

## 2023-05-29 DIAGNOSIS — I69391 Dysphagia following cerebral infarction: Secondary | ICD-10-CM | POA: Diagnosis not present

## 2023-06-01 DIAGNOSIS — Z9181 History of falling: Secondary | ICD-10-CM | POA: Diagnosis not present

## 2023-06-01 DIAGNOSIS — M6281 Muscle weakness (generalized): Secondary | ICD-10-CM | POA: Diagnosis not present

## 2023-06-01 DIAGNOSIS — G40919 Epilepsy, unspecified, intractable, without status epilepticus: Secondary | ICD-10-CM | POA: Diagnosis not present

## 2023-06-01 DIAGNOSIS — I480 Paroxysmal atrial fibrillation: Secondary | ICD-10-CM | POA: Diagnosis not present

## 2023-06-01 DIAGNOSIS — I69354 Hemiplegia and hemiparesis following cerebral infarction affecting left non-dominant side: Secondary | ICD-10-CM | POA: Diagnosis not present

## 2023-06-01 DIAGNOSIS — I69391 Dysphagia following cerebral infarction: Secondary | ICD-10-CM | POA: Diagnosis not present

## 2023-06-01 DIAGNOSIS — R2681 Unsteadiness on feet: Secondary | ICD-10-CM | POA: Diagnosis not present

## 2023-06-05 DIAGNOSIS — I69354 Hemiplegia and hemiparesis following cerebral infarction affecting left non-dominant side: Secondary | ICD-10-CM | POA: Diagnosis not present

## 2023-06-05 DIAGNOSIS — I480 Paroxysmal atrial fibrillation: Secondary | ICD-10-CM | POA: Diagnosis not present

## 2023-06-05 DIAGNOSIS — Z9181 History of falling: Secondary | ICD-10-CM | POA: Diagnosis not present

## 2023-06-05 DIAGNOSIS — I69391 Dysphagia following cerebral infarction: Secondary | ICD-10-CM | POA: Diagnosis not present

## 2023-06-05 DIAGNOSIS — G40919 Epilepsy, unspecified, intractable, without status epilepticus: Secondary | ICD-10-CM | POA: Diagnosis not present

## 2023-06-05 DIAGNOSIS — R2681 Unsteadiness on feet: Secondary | ICD-10-CM | POA: Diagnosis not present

## 2023-06-05 DIAGNOSIS — M6281 Muscle weakness (generalized): Secondary | ICD-10-CM | POA: Diagnosis not present

## 2023-06-07 DIAGNOSIS — F419 Anxiety disorder, unspecified: Secondary | ICD-10-CM | POA: Diagnosis not present

## 2023-06-07 DIAGNOSIS — R0989 Other specified symptoms and signs involving the circulatory and respiratory systems: Secondary | ICD-10-CM | POA: Diagnosis not present

## 2023-06-07 DIAGNOSIS — R058 Other specified cough: Secondary | ICD-10-CM | POA: Diagnosis not present

## 2023-06-07 DIAGNOSIS — R059 Cough, unspecified: Secondary | ICD-10-CM | POA: Diagnosis not present

## 2023-06-08 DIAGNOSIS — I10 Essential (primary) hypertension: Secondary | ICD-10-CM | POA: Diagnosis not present

## 2023-06-09 DIAGNOSIS — I10 Essential (primary) hypertension: Secondary | ICD-10-CM | POA: Diagnosis not present

## 2023-06-09 DIAGNOSIS — M6281 Muscle weakness (generalized): Secondary | ICD-10-CM | POA: Diagnosis not present

## 2023-06-09 DIAGNOSIS — F419 Anxiety disorder, unspecified: Secondary | ICD-10-CM | POA: Diagnosis not present

## 2023-06-09 DIAGNOSIS — R058 Other specified cough: Secondary | ICD-10-CM | POA: Diagnosis not present

## 2023-06-09 DIAGNOSIS — I480 Paroxysmal atrial fibrillation: Secondary | ICD-10-CM | POA: Diagnosis not present

## 2023-06-10 ENCOUNTER — Emergency Department (HOSPITAL_COMMUNITY): Payer: Medicare HMO

## 2023-06-10 ENCOUNTER — Encounter (HOSPITAL_COMMUNITY): Payer: Self-pay

## 2023-06-10 ENCOUNTER — Inpatient Hospital Stay (HOSPITAL_COMMUNITY)
Admission: EM | Admit: 2023-06-10 | Discharge: 2023-06-16 | DRG: 189 | Disposition: A | Discharge status: Other Acute Inpatient Hospital | Payer: Medicare HMO | Attending: Internal Medicine | Admitting: Internal Medicine

## 2023-06-10 ENCOUNTER — Inpatient Hospital Stay (HOSPITAL_COMMUNITY): Payer: Medicare HMO

## 2023-06-10 ENCOUNTER — Other Ambulatory Visit: Payer: Self-pay

## 2023-06-10 DIAGNOSIS — Z79899 Other long term (current) drug therapy: Secondary | ICD-10-CM

## 2023-06-10 DIAGNOSIS — I160 Hypertensive urgency: Secondary | ICD-10-CM | POA: Diagnosis not present

## 2023-06-10 DIAGNOSIS — I480 Paroxysmal atrial fibrillation: Secondary | ICD-10-CM | POA: Diagnosis present

## 2023-06-10 DIAGNOSIS — Z841 Family history of disorders of kidney and ureter: Secondary | ICD-10-CM

## 2023-06-10 DIAGNOSIS — J441 Chronic obstructive pulmonary disease with (acute) exacerbation: Secondary | ICD-10-CM | POA: Diagnosis present

## 2023-06-10 DIAGNOSIS — I6523 Occlusion and stenosis of bilateral carotid arteries: Secondary | ICD-10-CM | POA: Diagnosis not present

## 2023-06-10 DIAGNOSIS — I517 Cardiomegaly: Secondary | ICD-10-CM | POA: Diagnosis not present

## 2023-06-10 DIAGNOSIS — Z8673 Personal history of transient ischemic attack (TIA), and cerebral infarction without residual deficits: Secondary | ICD-10-CM

## 2023-06-10 DIAGNOSIS — R531 Weakness: Secondary | ICD-10-CM | POA: Diagnosis not present

## 2023-06-10 DIAGNOSIS — G40909 Epilepsy, unspecified, not intractable, without status epilepticus: Secondary | ICD-10-CM | POA: Diagnosis present

## 2023-06-10 DIAGNOSIS — J9811 Atelectasis: Secondary | ICD-10-CM | POA: Diagnosis not present

## 2023-06-10 DIAGNOSIS — J9601 Acute respiratory failure with hypoxia: Principal | ICD-10-CM | POA: Diagnosis present

## 2023-06-10 DIAGNOSIS — F341 Dysthymic disorder: Secondary | ICD-10-CM | POA: Diagnosis present

## 2023-06-10 DIAGNOSIS — M7989 Other specified soft tissue disorders: Secondary | ICD-10-CM

## 2023-06-10 DIAGNOSIS — I2510 Atherosclerotic heart disease of native coronary artery without angina pectoris: Secondary | ICD-10-CM | POA: Diagnosis not present

## 2023-06-10 DIAGNOSIS — G928 Other toxic encephalopathy: Secondary | ICD-10-CM | POA: Diagnosis not present

## 2023-06-10 DIAGNOSIS — E1165 Type 2 diabetes mellitus with hyperglycemia: Secondary | ICD-10-CM | POA: Diagnosis not present

## 2023-06-10 DIAGNOSIS — Z1152 Encounter for screening for COVID-19: Secondary | ICD-10-CM

## 2023-06-10 DIAGNOSIS — M79662 Pain in left lower leg: Secondary | ICD-10-CM | POA: Diagnosis not present

## 2023-06-10 DIAGNOSIS — I119 Hypertensive heart disease without heart failure: Secondary | ICD-10-CM | POA: Diagnosis present

## 2023-06-10 DIAGNOSIS — R059 Cough, unspecified: Secondary | ICD-10-CM | POA: Diagnosis not present

## 2023-06-10 DIAGNOSIS — I272 Pulmonary hypertension, unspecified: Secondary | ICD-10-CM | POA: Diagnosis not present

## 2023-06-10 DIAGNOSIS — E1159 Type 2 diabetes mellitus with other circulatory complications: Secondary | ICD-10-CM | POA: Diagnosis present

## 2023-06-10 DIAGNOSIS — F419 Anxiety disorder, unspecified: Secondary | ICD-10-CM | POA: Diagnosis present

## 2023-06-10 DIAGNOSIS — Z794 Long term (current) use of insulin: Secondary | ICD-10-CM

## 2023-06-10 DIAGNOSIS — R079 Chest pain, unspecified: Secondary | ICD-10-CM | POA: Diagnosis not present

## 2023-06-10 DIAGNOSIS — J209 Acute bronchitis, unspecified: Principal | ICD-10-CM

## 2023-06-10 DIAGNOSIS — Z7401 Bed confinement status: Secondary | ICD-10-CM | POA: Diagnosis not present

## 2023-06-10 DIAGNOSIS — Z888 Allergy status to other drugs, medicaments and biological substances status: Secondary | ICD-10-CM

## 2023-06-10 DIAGNOSIS — E871 Hypo-osmolality and hyponatremia: Secondary | ICD-10-CM | POA: Diagnosis present

## 2023-06-10 DIAGNOSIS — E1169 Type 2 diabetes mellitus with other specified complication: Secondary | ICD-10-CM | POA: Diagnosis present

## 2023-06-10 DIAGNOSIS — E785 Hyperlipidemia, unspecified: Secondary | ICD-10-CM | POA: Diagnosis present

## 2023-06-10 DIAGNOSIS — R062 Wheezing: Secondary | ICD-10-CM | POA: Diagnosis not present

## 2023-06-10 DIAGNOSIS — F32A Depression, unspecified: Secondary | ICD-10-CM | POA: Diagnosis present

## 2023-06-10 DIAGNOSIS — I959 Hypotension, unspecified: Secondary | ICD-10-CM | POA: Diagnosis not present

## 2023-06-10 DIAGNOSIS — E118 Type 2 diabetes mellitus with unspecified complications: Secondary | ICD-10-CM | POA: Diagnosis present

## 2023-06-10 DIAGNOSIS — Z87891 Personal history of nicotine dependence: Secondary | ICD-10-CM

## 2023-06-10 DIAGNOSIS — E876 Hypokalemia: Secondary | ICD-10-CM | POA: Diagnosis not present

## 2023-06-10 DIAGNOSIS — J9600 Acute respiratory failure, unspecified whether with hypoxia or hypercapnia: Secondary | ICD-10-CM | POA: Diagnosis not present

## 2023-06-10 DIAGNOSIS — Z7901 Long term (current) use of anticoagulants: Secondary | ICD-10-CM

## 2023-06-10 DIAGNOSIS — R0602 Shortness of breath: Secondary | ICD-10-CM | POA: Diagnosis not present

## 2023-06-10 DIAGNOSIS — E873 Alkalosis: Secondary | ICD-10-CM | POA: Diagnosis present

## 2023-06-10 DIAGNOSIS — I700 Atherosclerosis of aorta: Secondary | ICD-10-CM | POA: Diagnosis not present

## 2023-06-10 DIAGNOSIS — Z8249 Family history of ischemic heart disease and other diseases of the circulatory system: Secondary | ICD-10-CM

## 2023-06-10 DIAGNOSIS — R0689 Other abnormalities of breathing: Secondary | ICD-10-CM | POA: Diagnosis not present

## 2023-06-10 DIAGNOSIS — Z880 Allergy status to penicillin: Secondary | ICD-10-CM

## 2023-06-10 DIAGNOSIS — T43595A Adverse effect of other antipsychotics and neuroleptics, initial encounter: Secondary | ICD-10-CM | POA: Diagnosis not present

## 2023-06-10 DIAGNOSIS — G44209 Tension-type headache, unspecified, not intractable: Secondary | ICD-10-CM | POA: Diagnosis not present

## 2023-06-10 DIAGNOSIS — R4182 Altered mental status, unspecified: Secondary | ICD-10-CM | POA: Diagnosis not present

## 2023-06-10 DIAGNOSIS — Z833 Family history of diabetes mellitus: Secondary | ICD-10-CM

## 2023-06-10 DIAGNOSIS — Z823 Family history of stroke: Secondary | ICD-10-CM

## 2023-06-10 DIAGNOSIS — R0902 Hypoxemia: Secondary | ICD-10-CM | POA: Diagnosis not present

## 2023-06-10 DIAGNOSIS — Z6841 Body Mass Index (BMI) 40.0 and over, adult: Secondary | ICD-10-CM

## 2023-06-10 DIAGNOSIS — Z7984 Long term (current) use of oral hypoglycemic drugs: Secondary | ICD-10-CM

## 2023-06-10 DIAGNOSIS — G9389 Other specified disorders of brain: Secondary | ICD-10-CM | POA: Diagnosis not present

## 2023-06-10 LAB — CBC WITH DIFFERENTIAL/PLATELET
Abs Immature Granulocytes: 0.04 10*3/uL (ref 0.00–0.07)
Basophils Absolute: 0 10*3/uL (ref 0.0–0.1)
Basophils Relative: 0 %
Eosinophils Absolute: 0.1 10*3/uL (ref 0.0–0.5)
Eosinophils Relative: 1 %
HCT: 38.6 % (ref 36.0–46.0)
Hemoglobin: 12.8 g/dL (ref 12.0–15.0)
Immature Granulocytes: 0 %
Lymphocytes Relative: 30 %
Lymphs Abs: 3.2 10*3/uL (ref 0.7–4.0)
MCH: 30.8 pg (ref 26.0–34.0)
MCHC: 33.2 g/dL (ref 30.0–36.0)
MCV: 93 fL (ref 80.0–100.0)
Monocytes Absolute: 1.2 10*3/uL — ABNORMAL HIGH (ref 0.1–1.0)
Monocytes Relative: 11 %
Neutro Abs: 6.1 10*3/uL (ref 1.7–7.7)
Neutrophils Relative %: 58 %
Platelets: 205 10*3/uL (ref 150–400)
RBC: 4.15 MIL/uL (ref 3.87–5.11)
RDW: 14.7 % (ref 11.5–15.5)
WBC: 10.6 10*3/uL — ABNORMAL HIGH (ref 4.0–10.5)
nRBC: 0 % (ref 0.0–0.2)

## 2023-06-10 LAB — COMPREHENSIVE METABOLIC PANEL
ALT: 16 U/L (ref 0–44)
AST: 22 U/L (ref 15–41)
Albumin: 2.9 g/dL — ABNORMAL LOW (ref 3.5–5.0)
Alkaline Phosphatase: 53 U/L (ref 38–126)
Anion gap: 14 (ref 5–15)
BUN: 18 mg/dL (ref 8–23)
CO2: 27 mmol/L (ref 22–32)
Calcium: 8.9 mg/dL (ref 8.9–10.3)
Chloride: 97 mmol/L — ABNORMAL LOW (ref 98–111)
Creatinine, Ser: 0.63 mg/dL (ref 0.44–1.00)
GFR, Estimated: >60 mL/min (ref >60)
Glucose, Bld: 169 mg/dL — ABNORMAL HIGH (ref 70–99)
Potassium: 4.4 mmol/L (ref 3.5–5.1)
Sodium: 138 mmol/L (ref 135–145)
Total Bilirubin: 0.8 mg/dL (ref 0.3–1.2)
Total Protein: 7.1 g/dL (ref 6.5–8.1)

## 2023-06-10 LAB — SARS CORONAVIRUS 2 BY RT PCR: SARS Coronavirus 2 by RT PCR: NEGATIVE

## 2023-06-10 LAB — TROPONIN I (HIGH SENSITIVITY)
Troponin I (High Sensitivity): 11 ng/L (ref <18)
Troponin I (High Sensitivity): 9 ng/L (ref <18)

## 2023-06-10 LAB — BRAIN NATRIURETIC PEPTIDE: B Natriuretic Peptide: 380 pg/mL — ABNORMAL HIGH (ref 0.0–100.0)

## 2023-06-10 MED ORDER — LACOSAMIDE 200 MG PO TABS
200.0000 mg | ORAL_TABLET | Freq: Two times a day (BID) | ORAL | Status: DC
  Administered 2023-06-11 – 2023-06-16 (×12): 200 mg via ORAL
  Filled 2023-06-10 (×12): qty 1

## 2023-06-10 MED ORDER — IPRATROPIUM-ALBUTEROL 0.5-2.5 (3) MG/3ML IN SOLN
3.0000 mL | Freq: Once | RESPIRATORY_TRACT | Status: AC
  Administered 2023-06-10: 3 mL via RESPIRATORY_TRACT
  Filled 2023-06-10: qty 3

## 2023-06-10 MED ORDER — VALPROIC ACID 250 MG PO CAPS
500.0000 mg | ORAL_CAPSULE | Freq: Three times a day (TID) | ORAL | Status: DC
  Administered 2023-06-11 – 2023-06-16 (×18): 500 mg via ORAL
  Filled 2023-06-10 (×21): qty 2

## 2023-06-10 MED ORDER — LACTATED RINGERS IV SOLN: INTRAVENOUS | Status: DC

## 2023-06-10 MED ORDER — DILTIAZEM HCL ER COATED BEADS 240 MG PO CP24
420.0000 mg | ORAL_CAPSULE | Freq: Every day | ORAL | Status: DC
  Administered 2023-06-11 – 2023-06-16 (×6): 420 mg via ORAL
  Filled 2023-06-10 (×7): qty 1

## 2023-06-10 MED ORDER — PREDNISONE 20 MG PO TABS
40.0000 mg | ORAL_TABLET | Freq: Every day | ORAL | Status: AC
  Administered 2023-06-12 – 2023-06-15 (×4): 40 mg via ORAL
  Filled 2023-06-10 (×5): qty 2

## 2023-06-10 MED ORDER — IPRATROPIUM-ALBUTEROL 0.5-2.5 (3) MG/3ML IN SOLN: 3.0000 mL | Freq: Four times a day (QID) | RESPIRATORY_TRACT | Status: DC

## 2023-06-10 MED ORDER — METHYLPREDNISOLONE SODIUM SUCC 125 MG IJ SOLR
125.0000 mg | Freq: Once | INTRAMUSCULAR | Status: AC
  Administered 2023-06-10: 125 mg via INTRAVENOUS
  Filled 2023-06-10: qty 2

## 2023-06-10 MED ORDER — IPRATROPIUM-ALBUTEROL 0.5-2.5 (3) MG/3ML IN SOLN
3.0000 mL | Freq: Four times a day (QID) | RESPIRATORY_TRACT | Status: DC
  Administered 2023-06-11 – 2023-06-12 (×4): 3 mL via RESPIRATORY_TRACT
  Filled 2023-06-10 (×4): qty 3

## 2023-06-10 MED ORDER — ALBUTEROL SULFATE (2.5 MG/3ML) 0.083% IN NEBU: 2.5000 mg | INHALATION_SOLUTION | RESPIRATORY_TRACT | Status: DC | PRN

## 2023-06-10 MED ORDER — QUETIAPINE FUMARATE 25 MG PO TABS
50.0000 mg | ORAL_TABLET | Freq: Every day | ORAL | Status: DC
  Administered 2023-06-11: 50 mg via ORAL
  Filled 2023-06-10: qty 2

## 2023-06-10 MED ORDER — IOHEXOL 350 MG/ML SOLN
75.0000 mL | Freq: Once | INTRAVENOUS | Status: AC | PRN
  Administered 2023-06-10: 75 mL via INTRAVENOUS

## 2023-06-10 MED ORDER — APIXABAN 5 MG PO TABS
5.0000 mg | ORAL_TABLET | Freq: Two times a day (BID) | ORAL | Status: DC
  Administered 2023-06-11 – 2023-06-16 (×12): 5 mg via ORAL
  Filled 2023-06-10 (×12): qty 1

## 2023-06-10 MED ORDER — UMECLIDINIUM BROMIDE 62.5 MCG/ACT IN AEPB
1.0000 | INHALATION_SPRAY | Freq: Every day | RESPIRATORY_TRACT | Status: DC
  Administered 2023-06-11 – 2023-06-16 (×6): 1 via RESPIRATORY_TRACT
  Filled 2023-06-10: qty 7

## 2023-06-10 MED ORDER — AMLODIPINE BESYLATE 10 MG PO TABS
10.0000 mg | ORAL_TABLET | Freq: Every day | ORAL | Status: DC
  Administered 2023-06-11 – 2023-06-16 (×6): 10 mg via ORAL
  Filled 2023-06-10 (×6): qty 1

## 2023-06-10 MED ORDER — METHYLPREDNISOLONE SODIUM SUCC 40 MG IJ SOLR
40.0000 mg | Freq: Two times a day (BID) | INTRAMUSCULAR | Status: AC
  Administered 2023-06-11 (×2): 40 mg via INTRAVENOUS
  Filled 2023-06-10 (×2): qty 1

## 2023-06-10 MED ORDER — FLUTICASONE FUROATE-VILANTEROL 100-25 MCG/ACT IN AEPB
1.0000 | INHALATION_SPRAY | Freq: Every day | RESPIRATORY_TRACT | Status: DC
  Administered 2023-06-11 – 2023-06-16 (×6): 1 via RESPIRATORY_TRACT
  Filled 2023-06-10 (×2): qty 28

## 2023-06-10 MED ORDER — LOSARTAN POTASSIUM 50 MG PO TABS
100.0000 mg | ORAL_TABLET | Freq: Every day | ORAL | Status: DC
  Administered 2023-06-11 – 2023-06-16 (×6): 100 mg via ORAL
  Filled 2023-06-10 (×6): qty 2

## 2023-06-10 MED ORDER — METOPROLOL SUCCINATE ER 50 MG PO TB24
100.0000 mg | ORAL_TABLET | Freq: Every day | ORAL | Status: DC
  Administered 2023-06-11 – 2023-06-16 (×6): 100 mg via ORAL
  Filled 2023-06-10 (×6): qty 2

## 2023-06-10 MED ORDER — PRAVASTATIN SODIUM 40 MG PO TABS
20.0000 mg | ORAL_TABLET | Freq: Every day | ORAL | Status: DC
  Administered 2023-06-11 – 2023-06-16 (×6): 20 mg via ORAL
  Filled 2023-06-10 (×6): qty 1

## 2023-06-10 MED ORDER — DOXYCYCLINE HYCLATE 100 MG PO TABS
100.0000 mg | ORAL_TABLET | Freq: Two times a day (BID) | ORAL | Status: AC
  Administered 2023-06-10 – 2023-06-15 (×10): 100 mg via ORAL
  Filled 2023-06-10 (×10): qty 1

## 2023-06-10 NOTE — ED Provider Notes (Signed)
[place] EMERGENCY DEPARTMENT AT [facility] Provider Note   CSN: [id number] Arrival date & time: 06/10/23  1558     History  Chief Complaint  Patient presents with   Shortness of Breath    Allison Caldwell is a 77 y.o. female.  HPI 77 year old female with a history of diabetes, cardiomyopathy, hypertension, stroke, paroxysmal A-fib on Eliquis, and other comorbidities presents with left.  Patient endorses a cough for the last 3 days.  Productive of some white sputum.  She does not feel short of breath but EMS when picking her up noticed that her sats were in the 80s and put her on oxygen.  EMS reports the patient was originally called out from her facility due to the left leg swelling concerning for a DVT.  Patient tells me that her left leg hurts and has been swollen for several months since she had a stroke in May.  However over the last week she has been feeling some popliteal pain.  No chest pain.  Home Medications Prior to Admission medications   Medication Sig Start Date End Date Taking? Authorizing Provider  Accu-Chek FastClix Lancets MISC Check blood sugar 1 time a day 09/28/20   Belva Agee, MD  Accu-Chek Softclix Lancets lancets Use as directed up to 4 times daily. 09/26/22   Gwenevere Abbot, MD  albuterol (VENTOLIN HFA) 108 (90 Base) MCG/ACT inhaler Inhale 2 puffs into the lungs every 6 (six) hours as needed for wheezing or shortness of breath. Patient not taking: Reported on 03/25/2023 09/26/22   Gwenevere Abbot, MD  amLODipine (NORVASC) 10 MG tablet Take 1 tablet (10 mg total) by mouth daily. 04/11/23   Modena Slater, DO  apixaban (ELIQUIS) 5 MG TABS tablet Take 1 tablet (5 mg total) by mouth 2 (two) times daily. 04/10/23   Modena Slater, DO  blood glucose meter kit and supplies KIT Use up to four times daily as directed. 09/26/22   Gwenevere Abbot, MD  Blood Glucose Monitoring Suppl (ACCU-CHEK GUIDE) w/Device KIT 1 each by Does not apply route daily. Check blood  sugar 1 time a day 01/31/19   Levora Dredge, MD  feeding supplement (ENSURE ENLIVE / ENSURE PLUS) LIQD Take 237 mLs by mouth 2 (two) times daily between meals. 04/10/23   Modena Slater, DO  Fluticasone-Umeclidin-Vilant (TRELEGY ELLIPTA) 100-62.5-25 MCG/ACT AEPB Inhale 1 puff into the lungs daily. Patient not taking: Reported on 03/25/2023 09/26/22   Gwenevere Abbot, MD  glucose blood (ACCU-CHEK GUIDE) test strip Check blood sugar 1 time per day 09/28/20   Belva Agee, MD  glucose blood test strip Use as directed up to four times daily 09/26/22   Gwenevere Abbot, MD  insulin glargine-yfgn [facility]) 100 UNIT/ML injection Inject 0.1 mLs (10 Units total) into the skin daily. 04/10/23   Modena Slater, DO  Insulin Pen Needle 31G X 8 MM MISC Use 4 (four) times daily with insulin pens. 09/26/22   Gwenevere Abbot, MD  lacosamide (VIMPAT) 200 MG TABS tablet Take 1 tablet (200 mg total) by mouth 2 (two) times daily. 04/10/23   Modena Slater, DO  losartan (COZAAR) 100 MG tablet Take 1 tablet (100 mg total) by mouth daily. 04/11/23   Modena Slater, DO  melatonin 3 MG TABS tablet Take 1 tablet (3 mg total) by mouth at bedtime. 04/10/23   Modena Slater, DO  metFORMIN (GLUCOPHAGE) 500 MG tablet Take 1 tablet (500 mg total) by mouth 2 (two) times daily with a meal. 07/28/22   Katsadouros, Vasilios,  MD  metoprolol succinate (TOPROL-XL) 100 MG 24 hr tablet Take 1 tablet (100 mg total) by mouth daily. Take with or immediately following a meal. 04/11/23   Modena Slater, DO  Multiple Vitamin (MULTIVITAMIN WITH MINERALS) TABS tablet Take 1 tablet by mouth daily. 05/05/16   Funches, Gerilyn Nestle, MD  Nutritional Supplements (NUTRA/SHAKE) (Frozen) LIQD Take 1 Bottle by mouth 2 (two) times daily. 04/10/23   Modena Slater, DO  pravastatin (PRAVACHOL) 20 MG tablet TAKE 1 TABLET AT BEDTIME Patient taking differently: Take 20 mg by mouth daily. 03/09/22   Katsadouros, Vasilios, MD  QUEtiapine (SEROQUEL) 50 MG tablet Take 1 tablet (50 mg total) by mouth  daily. 04/11/23   Modena Slater, DO  sertraline (ZOLOFT) 100 MG tablet Take 1 tablet (100 mg total) by mouth daily. 07/28/22   Katsadouros, Vasilios, MD  TIADYLT ER 420 MG 24 hr capsule TAKE 1 CAPSULE EVERY DAY Patient taking differently: Take 420 mg by mouth daily. 01/06/22   Belva Agee, MD  valproic acid (DEPAKENE) 250 MG/5ML solution Take 10 mLs (500 mg total) by mouth every 8 (eight) hours. 04/10/23   Modena Slater, DO  zonisamide (ZONEGRAN) 100 MG capsule Take 1 capsule (100 mg total) by mouth at bedtime for 14 days, THEN 2 capsules (200 mg total) at bedtime for 14 days. 04/19/23 05/17/23  Rondel Baton, MD      Allergies    Ace inhibitors and Penicillins    Review of Systems   Review of Systems  Constitutional:  Negative for fever.  Respiratory:  Positive for cough. Negative for shortness of breath.   Cardiovascular:  Positive for leg swelling. Negative for chest pain.    Physical Exam Updated Vital Signs BP (!) 120/93   Pulse 75   Temp 98.3 F (36.8 C) (Oral)   Resp (!) 24   SpO2 100%  Physical Exam Vitals and nursing note reviewed.  Constitutional:      Appearance: She is well-developed.  HENT:     Head: Normocephalic and atraumatic.  Cardiovascular:     Rate and Rhythm: Normal rate and regular rhythm.     Heart sounds: Normal heart sounds.  Pulmonary:     Effort: Pulmonary effort is normal.     Breath sounds: Wheezing (diffuse, expiratory) present.  Abdominal:     Palpations: Abdomen is soft.     Tenderness: There is no abdominal tenderness.  Musculoskeletal:     Comments: Left leg appears bigger than right. However, no pitting edema. No calf tenderness in either. There is some popliteal tenderness without swelling. No anterior knee tenderness/swelling.   Skin:    General: Skin is warm and dry.  Neurological:     Mental Status: She is alert.     ED Results / Procedures / Treatments   Labs (all labs ordered are listed, but only abnormal results are  displayed) Labs Reviewed  COMPREHENSIVE METABOLIC PANEL - Abnormal; Notable for the following components:      Result Value   Chloride 97 (*)    Glucose, Bld 169 (*)    Albumin 2.9 (*)    All other components within normal limits  BRAIN NATRIURETIC PEPTIDE - Abnormal; Notable for the following components:   B Natriuretic Peptide 380.0 (*)    All other components within normal limits  CBC WITH DIFFERENTIAL/PLATELET - Abnormal; Notable for the following components:   WBC 10.6 (*)    Monocytes Absolute 1.2 (*)    All other components within normal limits  SARS CORONAVIRUS  2 BY RT PCR  URINALYSIS, W/ REFLEX TO CULTURE (INFECTION SUSPECTED)  TROPONIN I (HIGH SENSITIVITY)  TROPONIN I (HIGH SENSITIVITY)    EKG EKG Interpretation Date/Time:  Saturday June 10 2023 18:04:25 EDT Ventricular Rate:  56 PR Interval:    QRS Duration:  106 QT Interval:  459 QTC Calculation: 443 R Axis:   -33  Text Interpretation: Atrial fibrillation Low voltage, precordial leads Lateral infarct, recent similar to May 2024 Confirmed by Pricilla Loveless [phone]) on 06/10/2023 6:29:09 PM  Radiology VAS [place] LOWER EXTREMITY VENOUS (DVT) (7a-7p)  Result Date: 06/10/2023  Lower Venous DVT Study Patient Name:  NIL ANSTETT  Date of Exam:   06/10/2023 Medical Rec #: [id number]              Accession #:    [id number] Date of Birth: [birth year]              Patient Gender: F Patient Age:   89 years Exam Location:  [facility] Procedure:      VAS [place] LOWER EXTREMITY VENOUS (DVT) Referring Phys: Lorin Picket  --------------------------------------------------------------------------------  Indications: Edema.  Comparison Study: No prior study on file Performing Technologist: Sherren Kerns RVS  Examination Guidelines: A complete evaluation includes B-mode imaging, spectral Doppler, color Doppler, and power Doppler as needed of all accessible portions of each vessel. Bilateral testing is considered an integral part  of a complete examination. Limited examinations for reoccurring indications may be performed as noted. The reflux portion of the exam is performed with the patient in reverse Trendelenburg.  +-----+---------------+---------+-----------+-------------------+--------------+ RIGHTCompressibilityPhasicitySpontaneityProperties         Thrombus Aging +-----+---------------+---------+-----------+-------------------+--------------+ CFV  Full                               pulsatile waveforms               +-----+---------------+---------+-----------+-------------------+--------------+   +--------+---------------+---------+-----------+----------------+-------------+ LEFT    CompressibilityPhasicitySpontaneityProperties      Thrombus                                                                 Aging         +--------+---------------+---------+-----------+----------------+-------------+ CFV     Full                               pulsatile                                                                waveforms                     +--------+---------------+---------+-----------+----------------+-------------+ SFJ     Full                                                             +--------+---------------+---------+-----------+----------------+-------------+  FV Prox Full                                                             +--------+---------------+---------+-----------+----------------+-------------+ FV Mid  Full                                                             +--------+---------------+---------+-----------+----------------+-------------+ FV      Full                                                             Distal                                                                   +--------+---------------+---------+-----------+----------------+-------------+ PFV     Full                                                              +--------+---------------+---------+-----------+----------------+-------------+ POP     Full                               pulsatile                                                                waveforms                     +--------+---------------+---------+-----------+----------------+-------------+ PTV     Full                                                             +--------+---------------+---------+-----------+----------------+-------------+ PERO    Full                                                             +--------+---------------+---------+-----------+----------------+-------------+    Summary: RIGHT: - No evidence of common femoral vein obstruction.  LEFT: - There is no evidence of deep vein thrombosis in the lower extremity.  Pulsatile waveforms.  *See table(s) above for measurements and observations.    Preliminary    DG Chest 2 View  Result Date: 06/10/2023 CLINICAL DATA:  Cough EXAM: CHEST - 2 VIEW COMPARISON:  04/18/2023 FINDINGS: Stable cardiomegaly. Aortic atherosclerosis. No focal airspace consolidation, pleural effusion, or pneumothorax. IMPRESSION: Cardiomegaly. No acute cardiopulmonary findings. Electronically Signed   By: Duanne Guess D.O.   On: 06/10/2023 16:57    Procedures Procedures    Medications Ordered in ED Medications  ipratropium-albuterol (DUONEB) 0.5-2.5 (3) MG/3ML nebulizer solution 3 mL (3 mLs Nebulization Given 06/10/23 1723)  methylPREDNISolone sodium succinate (SOLU-MEDROL) 125 mg/2 mL injection 125 mg (125 mg Intravenous Given 06/10/23 1811)  ipratropium-albuterol (DUONEB) 0.5-2.5 (3) MG/3ML nebulizer solution 3 mL (3 mLs Nebulization Given 06/10/23 1811)  iohexol (OMNIPAQUE) 350 MG/ML injection 75 mL (75 mLs Intravenous Contrast Given 06/10/23 1916)    ED Course/ Medical Decision Making/ A&P                                 Medical Decision Making Amount and/or Complexity of Data Reviewed Labs: ordered.     Details: Mild leukocytosis.  BNP at 380.  Normal troponin.  COVID-negative. Radiology: ordered and independent interpretation performed.    Details: No CHF or pneumonia ECG/medicine tests: ordered and independent interpretation performed.    Details: A-fib without RVR.  No ischemia.  Risk Prescription drug management. Decision regarding hospitalization.   Patient presents with respiratory failure.  She is requiring oxygen at first. She was having significant wheezing and was given a couple duonebs.  She actually seems to be better from an oxygenation standpoint but still tachypneic on my exam.  There is no DVT on ultrasound.  Given all this I think is reasonable to admit for further respiratory support.  She does not carry a COPD diagnosis but does have pulmonary hypertension.  The wheezing does seem better to me.  Discussed with Dr. Hanley Hays for admission who is asking for a CTA which I think is reasonable.  No clear pneumonia at this time.  Will admit.        Final Clinical Impression(s) / ED Diagnoses Final diagnoses:  Acute bronchitis, unspecified organism    Rx / DC Orders ED Discharge Orders     None         Pricilla Loveless, MD 06/10/23 1928

## 2023-06-10 NOTE — Progress Notes (Signed)
VASCULAR LAB    Left lower extremity venous duplex has been performed.  See CV proc for preliminary results.  Messaged negative results to Dr. Criss Alvine via secure chat.  , , RVT 06/10/2023, 5:00 PM

## 2023-06-10 NOTE — H&P (Signed)
History and Physical    Patient: Allison Caldwell ZOX:[id number] DOB: [birth year] DOA: 06/10/2023 DOS: the patient was seen and examined on 06/10/2023 PCP: Morrie Sheldon, MD  Patient coming from: Home  Chief Complaint:  Chief Complaint  Patient presents with   Shortness of Breath   HPI: Allison Caldwell is a 77 y.o. female with medical history significant of diabetes, hypertension, morbid obesity, history of TIA, anxiety disorder, degenerative joint disease, cardiomyopathy, paroxysmal atrial fibrillation on chronic anticoagulation with Eliquis who presented with left lower extremity swelling suspected to be DVT.  Patient started having cough for the last 3 days.  The cough was productive of some white sputum.  No shortness of breath but when EMS arrived her oxygen sats was in the 80s.  Patient was placed on oxygen.  She lives in a facility and was brought to the ER.  Workup for her left leg swelling was negative for DVT.  The leg apparently has been swollen and painful for months.  Patient apparently had a TIA in May.  Patient was subsequently found to have acute hypoxemia.  She is wheezing and therefore suspected to have acute exacerbation of COPD.  Patient being admitted for further evaluation and treatment.  Review of Systems: As mentioned in the history of present illness. All other systems reviewed and are negative. Past Medical History:  Diagnosis Date   Anxiety    Cardiomyopathy    Degenerative joint disease    Depression    Diabetes mellitus    NIDDM   Hypertension    Morbid obesity with BMI of 45.0-49.9, adult (HCC)    TIA (transient ischemic attack)    Past Surgical History:  Procedure Laterality Date   CESAREAN SECTION     times 2   EYE SURGERY     laser - right eye 2010   left wrist     surgical repair   ORIF ANKLE FRACTURE     right   Social History:  reports that she has quit smoking. Her smoking use included cigarettes. She has never used smokeless  tobacco. She reports current alcohol use. She reports that she does not use drugs.  Allergies  Allergen Reactions   Ace Inhibitors Other (See Comments)     cough   Penicillins Other (See Comments)    Muscle spasms, Has patient had a PCN reaction causing immediate rash, facial/tongue/throat swelling, SOB or lightheadedness with hypotension: Yes Has patient had a PCN reaction causing severe rash involving mucus membranes or skin necrosis: No Has patient had a PCN reaction that required hospitalization No Has patient had a PCN reaction occurring within the last 10 years: Yes If all of the above answers are "NO", then may proceed with Cephalosporin use.     Family History  Problem Relation Age of Onset   Heart disease Mother    Cancer Father    Diabetes Sister    Kidney disease Brother    Stroke Brother     Prior to Admission medications   Medication Sig Start Date End Date Taking? Authorizing Provider  Accu-Chek FastClix Lancets MISC Check blood sugar 1 time a day 09/28/20   Belva Agee, MD  Accu-Chek Softclix Lancets lancets Use as directed up to 4 times daily. 09/26/22   Gwenevere Abbot, MD  albuterol (VENTOLIN HFA) 108 (90 Base) MCG/ACT inhaler Inhale 2 puffs into the lungs every 6 (six) hours as needed for wheezing or shortness of breath. Patient not taking: Reported on 03/25/2023 09/26/22   Welton Flakes,  Ghalib, MD  amLODipine (NORVASC) 10 MG tablet Take 1 tablet (10 mg total) by mouth daily. 04/11/23   Modena Slater, DO  apixaban (ELIQUIS) 5 MG TABS tablet Take 1 tablet (5 mg total) by mouth 2 (two) times daily. 04/10/23   Modena Slater, DO  blood glucose meter kit and supplies KIT Use up to four times daily as directed. 09/26/22   Gwenevere Abbot, MD  Blood Glucose Monitoring Suppl (ACCU-CHEK GUIDE) w/Device KIT 1 each by Does not apply route daily. Check blood sugar 1 time a day 01/31/19   Levora Dredge, MD  feeding supplement (ENSURE ENLIVE / ENSURE PLUS) LIQD Take 237 mLs by mouth 2  (two) times daily between meals. 04/10/23   Modena Slater, DO  Fluticasone-Umeclidin-Vilant (TRELEGY ELLIPTA) 100-62.5-25 MCG/ACT AEPB Inhale 1 puff into the lungs daily. Patient not taking: Reported on 03/25/2023 09/26/22   Gwenevere Abbot, MD  glucose blood (ACCU-CHEK GUIDE) test strip Check blood sugar 1 time per day 09/28/20   Belva Agee, MD  glucose blood test strip Use as directed up to four times daily 09/26/22   Gwenevere Abbot, MD  insulin glargine-yfgn [facility]) 100 UNIT/ML injection Inject 0.1 mLs (10 Units total) into the skin daily. 04/10/23   Modena Slater, DO  Insulin Pen Needle 31G X 8 MM MISC Use 4 (four) times daily with insulin pens. 09/26/22   Gwenevere Abbot, MD  lacosamide (VIMPAT) 200 MG TABS tablet Take 1 tablet (200 mg total) by mouth 2 (two) times daily. 04/10/23   Modena Slater, DO  losartan (COZAAR) 100 MG tablet Take 1 tablet (100 mg total) by mouth daily. 04/11/23   Modena Slater, DO  melatonin 3 MG TABS tablet Take 1 tablet (3 mg total) by mouth at bedtime. 04/10/23   Modena Slater, DO  metFORMIN (GLUCOPHAGE) 500 MG tablet Take 1 tablet (500 mg total) by mouth 2 (two) times daily with a meal. 07/28/22   Katsadouros, Vasilios, MD  metoprolol succinate (TOPROL-XL) 100 MG 24 hr tablet Take 1 tablet (100 mg total) by mouth daily. Take with or immediately following a meal. 04/11/23   Modena Slater, DO  Multiple Vitamin (MULTIVITAMIN WITH MINERALS) TABS tablet Take 1 tablet by mouth daily. 05/05/16   Funches, Gerilyn Nestle, MD  Nutritional Supplements (NUTRA/SHAKE) (Frozen) LIQD Take 1 Bottle by mouth 2 (two) times daily. 04/10/23   Modena Slater, DO  pravastatin (PRAVACHOL) 20 MG tablet TAKE 1 TABLET AT BEDTIME Patient taking differently: Take 20 mg by mouth daily. 03/09/22   Katsadouros, Vasilios, MD  QUEtiapine (SEROQUEL) 50 MG tablet Take 1 tablet (50 mg total) by mouth daily. 04/11/23   Modena Slater, DO  sertraline (ZOLOFT) 100 MG tablet Take 1 tablet (100 mg total) by mouth daily. 07/28/22    Katsadouros, Vasilios, MD  TIADYLT ER 420 MG 24 hr capsule TAKE 1 CAPSULE EVERY DAY Patient taking differently: Take 420 mg by mouth daily. 01/06/22   Belva Agee, MD  valproic acid (DEPAKENE) 250 MG/5ML solution Take 10 mLs (500 mg total) by mouth every 8 (eight) hours. 04/10/23   Modena Slater, DO  zonisamide (ZONEGRAN) 100 MG capsule Take 1 capsule (100 mg total) by mouth at bedtime for 14 days, THEN 2 capsules (200 mg total) at bedtime for 14 days. 04/19/23 05/17/23  Rondel Baton, MD    Physical Exam: Vitals:   06/10/23 1745 06/10/23 1820 06/10/23 1830 06/10/23 1924  BP:  124/83 (!) 120/93 125/89  Pulse: (!) 52 70 75 (!) 101  Resp: (!) 26 20 (!)  24 14  Temp:      TempSrc:      SpO2: 99% 97% 100% 100%   Constitutional: NAD, calm, comfortable Eyes: PERRL, lids and conjunctivae normal ENMT: Mucous membranes are moist. Posterior pharynx clear of any exudate or lesions.Normal dentition.  Neck: normal, supple, no masses, no thyromegaly Respiratory: Decreased air entry bilateral with marked expiratory wheezing,  no crackles. Normal respiratory effort. No accessory muscle use.  Cardiovascular: Regular rate and rhythm, no murmurs / rubs / gallops. No extremity edema. 2+ pedal pulses. No carotid bruits.  Abdomen: no tenderness, no masses palpated. No hepatosplenomegaly. Bowel sounds positive.  Musculoskeletal: Good range of motion, no joint swelling or tenderness, Skin: no rashes, lesions, ulcers. No induration Neurologic: CN 2-12 grossly intact. Sensation intact, DTR normal. Strength 5/5 in all 4.  Psychiatric: Normal judgment and insight. Alert and oriented x 3. Normal mood  Data Reviewed:  Chlorides 97 glucose 169 and albumin 2.9.  BNP of 380.  White count 10.6.  Acute viral screening is negative.  Chest x-ray showed cardiomegaly with no acute cardiopulmonary process.  Vascular ultrasound Doppler shows no DVT on the left leg  Assessment and Plan:  #1 acute hypoxic respiratory  failure: Secondary to suspected COPD exacerbation.  Patient will be treated as such with steroids, nebulizer treatment and antibiotics.  Will continue to monitor closely.  Continue with other supportive care.  #2 diabetes: Continue sliding scale insulin.  Also long-acting Lantus.  #3 essential hypertension: Continue blood pressure monitoring.  #4 hyponatremia: Probably dehydration.  Continue to monitor.  #5 paroxysmal atrial fibrillation: Continue Eliquis.  #6 hyperlipidemia: Continue with statin  #7 morbid obesity: Dietary counseling  #8 history of hemorrhagic stroke: Chronic.  No new issues    Advance Care Planning:   Code Status: Full Code   Consults: None  Family Communication: No family at bedside  Severity of Illness: The appropriate patient status for this patient is INPATIENT. Inpatient status is judged to be reasonable and necessary in order to provide the required intensity of service to ensure the patient's safety. The patient's presenting symptoms, physical exam findings, and initial radiographic and laboratory data in the context of their chronic comorbidities is felt to place them at high risk for further clinical deterioration. Furthermore, it is not anticipated that the patient will be medically stable for discharge from the hospital within 2 midnights of admission.   * I certify that at the point of admission it is my clinical judgment that the patient will require inpatient hospital care spanning beyond 2 midnights from the point of admission due to high intensity of service, high risk for further deterioration and high frequency of surveillance required.*  AuthorLonia Blood, MD 06/10/2023 7:31 PM  For on call review [email or web address].

## 2023-06-10 NOTE — ED Notes (Signed)
Writer attempted to call daughter Enrique Sack to give update per request. Sent straight to VM. Writer left VM giving return contact number.

## 2023-06-10 NOTE — ED Notes (Addendum)
ED TO INPATIENT HANDOFF REPORT  ED Nurse Name and Phone #: Merry Lofty [phone]  S Name/Age/Gender Allison Caldwell 77 y.o. female Room/Bed: 033C/033C  Code Status   Code Status: Prior  Home/SNF/Other Skilled nursing facility Patient oriented to: self, place, time, and situation Is this baseline? Yes   Triage Complete: Triage complete  Chief Complaint Acute hypoxemic respiratory failure (HCC) [J96.01]  Triage Note Pt BIB from [facility] d/t SOB. EMS was intimally called out for possible DVT on pt's Left leg. Upon EMS arrival, pt's sat was in the 80s, but pt did not appear to be SOB. Wheezing in all filed. Hx AFIB. A&O X4 .   HR 60 SPO2 93% BP 152/113 RR 30  CBG 158   Allergies Allergies  Allergen Reactions   Ace Inhibitors Other (See Comments)     cough   Penicillins Other (See Comments)    Muscle spasms, Has patient had a PCN reaction causing immediate rash, facial/tongue/throat swelling, SOB or lightheadedness with hypotension: Yes Has patient had a PCN reaction causing severe rash involving mucus membranes or skin necrosis: No Has patient had a PCN reaction that required hospitalization No Has patient had a PCN reaction occurring within the last 10 years: Yes If all of the above answers are "NO", then may proceed with Cephalosporin use.     Level of Care/Admitting Diagnosis ED Disposition     ED Disposition  Admit   Condition  --   Comment  Hospital Area: [facility] [[id number]]  Level of Care: Telemetry Medical [[id number]]  May admit patient to [facility] or [facility] if equivalent level of care is available:: Yes  Covid Evaluation: Asymptomatic - no recent exposure (last 10 days) testing not required  Diagnosis: Acute hypoxemic respiratory failure [facility]) [[id number]]  Admitting Physician: Rometta Emery [[id number]]  Attending Physician: Rometta Emery [[id number]]  Certification:: I certify this patient will need inpatient services for at least 2  midnights  Estimated Length of Stay: 3          B Medical/Surgery History Past Medical History:  Diagnosis Date   Anxiety    Cardiomyopathy    Degenerative joint disease    Depression    Diabetes mellitus    NIDDM   Hypertension    Morbid obesity with BMI of 45.0-49.9, adult (HCC)    TIA (transient ischemic attack)    Past Surgical History:  Procedure Laterality Date   CESAREAN SECTION     times 2   EYE SURGERY     laser - right eye 2010   left wrist     surgical repair   ORIF ANKLE FRACTURE     right     A IV Location/Drains/Wounds Patient Lines/Drains/Airways Status     Active Line/Drains/Airways     Name Placement date Placement time Site Days   Peripheral IV 06/10/23 20 G Anterior;Right Forearm 06/10/23  1624  Forearm  less than 1            Intake/Output Last 24 hours No intake or output data in the 24 hours ending 06/10/23 1925  Labs/Imaging Results for orders placed or performed during the hospital encounter of 06/10/23 (from the past 48 hour(s))  Comprehensive metabolic panel     Status: Abnormal   Collection Time: 06/10/23  4:25 PM  Result Value Ref Range   Sodium 138 135 - 145 mmol/L   Potassium 4.4 3.5 - 5.1 mmol/L    Comment: HEMOLYSIS AT THIS LEVEL  MAY AFFECT RESULT   Chloride 97 (L) 98 - 111 mmol/L   CO2 27 22 - 32 mmol/L   Glucose, Bld 169 (H) 70 - 99 mg/dL    Comment: Glucose reference range applies only to samples taken after fasting for at least 8 hours.   BUN 18 8 - 23 mg/dL   Creatinine, Ser 1.61 0.44 - 1.00 mg/dL   Calcium 8.9 8.9 - 09.6 mg/dL   Total Protein 7.1 6.5 - 8.1 g/dL   Albumin 2.9 (L) 3.5 - 5.0 g/dL   AST 22 15 - 41 U/L    Comment: HEMOLYSIS AT THIS LEVEL MAY AFFECT RESULT   ALT 16 0 - 44 U/L    Comment: HEMOLYSIS AT THIS LEVEL MAY AFFECT RESULT   Alkaline Phosphatase 53 38 - 126 U/L   Total Bilirubin 0.8 0.3 - 1.2 mg/dL    Comment: HEMOLYSIS AT THIS LEVEL MAY AFFECT RESULT   GFR, Estimated >60 >60 mL/min     Comment: (NOTE) Calculated using the CKD-EPI Creatinine Equation (2021)    Anion gap 14 5 - 15    Comment: Performed at [facility], [address], [place], [place] [address]  Troponin I (High Sensitivity)     Status: None   Collection Time: 06/10/23  4:25 PM  Result Value Ref Range   Troponin I (High Sensitivity) 11 <18 ng/L    Comment: (NOTE) Elevated high sensitivity troponin I (hsTnI) values and significant  changes across serial measurements may suggest ACS but many other  chronic and acute conditions are known to elevate hsTnI results.  Refer to the "Links" section for chest pain algorithms and additional  guidance. Performed at [facility], [address], [place], [place] [address]   Brain natriuretic peptide     Status: Abnormal   Collection Time: 06/10/23  4:25 PM  Result Value Ref Range   B Natriuretic Peptide 380.0 (H) 0.0 - 100.0 pg/mL    Comment: Performed at [facility], [address] [place], [place] [address]  CBC with Differential     Status: Abnormal   Collection Time: 06/10/23  4:25 PM  Result Value Ref Range   WBC 10.6 (H) 4.0 - 10.5 K/uL   RBC 4.15 3.87 - 5.11 MIL/uL   Hemoglobin 12.8 12.0 - 15.0 g/dL   HCT 95.6 21.3 - 08.6 %   MCV 93.0 80.0 - 100.0 fL   MCH 30.8 26.0 - 34.0 pg   MCHC 33.2 30.0 - 36.0 g/dL   RDW 57.8 46.9 - 62.9 %   Platelets 205 150 - 400 K/uL   nRBC 0.0 0.0 - 0.2 %   Neutrophils Relative % 58 %   Neutro Abs 6.1 1.7 - 7.7 K/uL   Lymphocytes Relative 30 %   Lymphs Abs 3.2 0.7 - 4.0 K/uL   Monocytes Relative 11 %   Monocytes Absolute 1.2 (H) 0.1 - 1.0 K/uL   Eosinophils Relative 1 %   Eosinophils Absolute 0.1 0.0 - 0.5 K/uL   Basophils Relative 0 %   Basophils Absolute 0.0 0.0 - 0.1 K/uL   Immature Granulocytes 0 %   Abs Immature Granulocytes 0.04 0.00 - 0.07 K/uL    Comment: Performed at [facility], [address] [place], [place] [address]  SARS Coronavirus 2 by RT PCR (hospital order, performed  in [facility] hospital lab) *cepheid single result test* Anterior Nasal Swab     Status: None   Collection Time:  06/10/23  4:27 PM   Specimen: Anterior Nasal Swab  Result Value Ref Range   SARS Coronavirus 2 by RT PCR NEGATIVE NEGATIVE    Comment: Performed at [facility], [address] [place], [place] [address]  Troponin I (High Sensitivity)     Status: None   Collection Time: 06/10/23  6:17 PM  Result Value Ref Range   Troponin I (High Sensitivity) 9 <18 ng/L    Comment: (NOTE) Elevated high sensitivity troponin I (hsTnI) values and significant  changes across serial measurements may suggest ACS but many other  chronic and acute conditions are known to elevate hsTnI results.  Refer to the "Links" section for chest pain algorithms and additional  guidance. Performed at [facility], [address], [place], [place] [address]    VAS [place] LOWER EXTREMITY VENOUS (DVT) (7a-7p)  Result Date: 06/10/2023  Lower Venous DVT Study Patient Name:  Allison Caldwell  Date of Exam:   06/10/2023 Medical Rec #: [id number]              Accession #:    [id number] Date of Birth: 05/26/1946              Patient Gender: F Patient Age:   71 years Exam Location:  [facility] Procedure:      VAS [place] LOWER EXTREMITY VENOUS (DVT) Referring Phys: Lorin Picket [place] --------------------------------------------------------------------------------  Indications: Edema.  Comparison Study: No prior study on file Performing Technologist: Sherren Kerns RVS  Examination Guidelines: A complete evaluation includes B-mode imaging, spectral Doppler, color Doppler, and power Doppler as needed of all accessible portions of each vessel. Bilateral testing is considered an integral part of a complete examination. Limited examinations for reoccurring indications may be performed as noted. The reflux portion of the exam is performed with the patient in reverse Trendelenburg.   +-----+---------------+---------+-----------+-------------------+--------------+ RIGHTCompressibilityPhasicitySpontaneityProperties         Thrombus Aging +-----+---------------+---------+-----------+-------------------+--------------+ CFV  Full                               pulsatile waveforms               +-----+---------------+---------+-----------+-------------------+--------------+   +--------+---------------+---------+-----------+----------------+-------------+ LEFT    CompressibilityPhasicitySpontaneityProperties      Thrombus                                                                 Aging         +--------+---------------+---------+-----------+----------------+-------------+ CFV     Full                               pulsatile                                                                waveforms                     +--------+---------------+---------+-----------+----------------+-------------+ SFJ     Full                                                             +--------+---------------+---------+-----------+----------------+-------------+  FV Prox Full                                                             +--------+---------------+---------+-----------+----------------+-------------+ FV Mid  Full                                                             +--------+---------------+---------+-----------+----------------+-------------+ FV      Full                                                             Distal                                                                   +--------+---------------+---------+-----------+----------------+-------------+ PFV     Full                                                             +--------+---------------+---------+-----------+----------------+-------------+ POP     Full                               pulsatile                                                                 waveforms                     +--------+---------------+---------+-----------+----------------+-------------+ PTV     Full                                                             +--------+---------------+---------+-----------+----------------+-------------+ PERO    Full                                                             +--------+---------------+---------+-----------+----------------+-------------+    Summary: RIGHT: - No evidence of common femoral vein obstruction.  LEFT: - There is no evidence of deep vein thrombosis in the lower extremity.  Pulsatile waveforms.  *See table(s) above for measurements and observations.    Preliminary    DG Chest 2 View  Result Date: 06/10/2023 CLINICAL DATA:  Cough EXAM: CHEST - 2 VIEW COMPARISON:  04/18/2023 FINDINGS: Stable cardiomegaly. Aortic atherosclerosis. No focal airspace consolidation, pleural effusion, or pneumothorax. IMPRESSION: Cardiomegaly. No acute cardiopulmonary findings. Electronically Signed   By: Duanne Guess D.O.   On: 06/10/2023 16:57    Pending Labs Unresulted Labs (From admission, onward)     Start     Ordered   06/10/23 1626  Urinalysis, w/ Reflex to Culture (Infection Suspected) -Urine, Clean Catch  Once,   URGENT       Question:  Specimen Source  Answer:  Urine, Clean Catch   06/10/23 1626            Vitals/Pain Today's Vitals   06/10/23 1745 06/10/23 1820 06/10/23 1830 06/10/23 1924  BP:  124/83 (!) 120/93 125/89  Pulse: (!) 52 70 75 (!) 101  Resp: (!) 26 20 (!) 24 14  Temp:      TempSrc:      SpO2: 99% 97% 100% 100%    Isolation Precautions No active isolations  Medications Medications  ipratropium-albuterol (DUONEB) 0.5-2.5 (3) MG/3ML nebulizer solution 3 mL (3 mLs Nebulization Given 06/10/23 1723)  methylPREDNISolone sodium succinate (SOLU-MEDROL) 125 mg/2 mL injection 125 mg (125 mg Intravenous Given 06/10/23 1811)  ipratropium-albuterol (DUONEB) 0.5-2.5  (3) MG/3ML nebulizer solution 3 mL (3 mLs Nebulization Given 06/10/23 1811)  iohexol (OMNIPAQUE) 350 MG/ML injection 75 mL (75 mLs Intravenous Contrast Given 06/10/23 1916)    Mobility power wheelchair     Focused Assessments Pulmonary Assessment Handoff:  Lung sounds: L Breath Sounds: Expiratory wheezes R Breath Sounds: Expiratory wheezes O2 Device: Room Air      R Recommendations: See Admitting Provider Note  Report given to:   Additional Notes: Pt sent from [facility] for SOB and possible DVT in left leg. O2 in 80s on RA per EMS. O2 in mid/upper 90s on 2L [place] in ED. Pt A&Ox4 with coughing fits in ED. Mildly tachycardic when coughing. Respiratory panel (-). Admitting for acute bronchitis. 20G in Rt forearm. BNP - 380 (baseline); trops (-); CTA chest pending.

## 2023-06-10 NOTE — ED Notes (Signed)
Daughter Enrique Sack [phone] would like an update asap

## 2023-06-10 NOTE — ED Notes (Signed)
Patient denies pain and is resting comfortably.  

## 2023-06-10 NOTE — ED Triage Notes (Signed)
Pt BIB from [facility] d/t SOB. EMS was intimally called out for possible DVT on pt's Left leg. Upon EMS arrival, pt's sat was in the 80s, but pt did not appear to be SOB. Wheezing in all filed. Hx AFIB. A&O X4 .   HR 60 SPO2 93% BP 152/113 RR 30  CBG 158

## 2023-06-11 ENCOUNTER — Inpatient Hospital Stay (HOSPITAL_COMMUNITY): Payer: Medicare HMO

## 2023-06-11 DIAGNOSIS — J9601 Acute respiratory failure with hypoxia: Secondary | ICD-10-CM | POA: Diagnosis not present

## 2023-06-11 LAB — HIV ANTIBODY (ROUTINE TESTING W REFLEX): HIV Screen 4th Generation wRfx: NONREACTIVE

## 2023-06-11 LAB — COMPREHENSIVE METABOLIC PANEL WITH GFR
ALT: 12 U/L (ref 0–44)
AST: 15 U/L (ref 15–41)
Albumin: 2.7 g/dL — ABNORMAL LOW (ref 3.5–5.0)
Alkaline Phosphatase: 53 U/L (ref 38–126)
Anion gap: 16 — ABNORMAL HIGH (ref 5–15)
BUN: 18 mg/dL (ref 8–23)
CO2: 26 mmol/L (ref 22–32)
Calcium: 8.9 mg/dL (ref 8.9–10.3)
Chloride: 96 mmol/L — ABNORMAL LOW (ref 98–111)
Creatinine, Ser: 0.65 mg/dL (ref 0.44–1.00)
GFR, Estimated: >60 mL/min (ref >60)
Glucose, Bld: 254 mg/dL — ABNORMAL HIGH (ref 70–99)
Potassium: 4 mmol/L (ref 3.5–5.1)
Sodium: 138 mmol/L (ref 135–145)
Total Bilirubin: 0.4 mg/dL (ref 0.3–1.2)
Total Protein: 7.2 g/dL (ref 6.5–8.1)

## 2023-06-11 LAB — BLOOD GAS, ARTERIAL
Acid-Base Excess: 6.4 mmol/L — ABNORMAL HIGH (ref 0.0–2.0)
Bicarbonate: 31.3 mmol/L — ABNORMAL HIGH (ref 20.0–28.0)
O2 Saturation: 92.6 %
Patient temperature: 36.6
pCO2 arterial: 44 mmHg (ref 32–48)
pH, Arterial: 7.46 — ABNORMAL HIGH (ref 7.35–7.45)
pO2, Arterial: 59 mmHg — ABNORMAL LOW (ref 83–108)

## 2023-06-11 LAB — CBC
HCT: 37.7 % (ref 36.0–46.0)
Hemoglobin: 12.7 g/dL (ref 12.0–15.0)
MCH: 30.3 pg (ref 26.0–34.0)
MCHC: 33.7 g/dL (ref 30.0–36.0)
MCV: 90 fL (ref 80.0–100.0)
Platelets: 202 10*3/uL (ref 150–400)
RBC: 4.19 MIL/uL (ref 3.87–5.11)
RDW: 14.6 % (ref 11.5–15.5)
WBC: 9.2 10*3/uL (ref 4.0–10.5)
nRBC: 0 % (ref 0.0–0.2)

## 2023-06-11 LAB — GLUCOSE, CAPILLARY
Glucose-Capillary: 272 mg/dL — ABNORMAL HIGH (ref 70–99)
Glucose-Capillary: 282 mg/dL — ABNORMAL HIGH (ref 70–99)
Glucose-Capillary: 242 mg/dL — ABNORMAL HIGH (ref 70–99)

## 2023-06-11 MED ORDER — ORAL CARE MOUTH RINSE: 15.0000 mL | OROMUCOSAL | Status: DC | PRN

## 2023-06-11 MED ORDER — INSULIN GLARGINE-YFGN 100 UNIT/ML ~~LOC~~ SOLN
13.0000 [IU] | Freq: Every day | SUBCUTANEOUS | Status: DC
  Administered 2023-06-11 – 2023-06-15 (×5): 13 [IU] via SUBCUTANEOUS
  Filled 2023-06-11 (×6): qty 0.13

## 2023-06-11 MED ORDER — QUETIAPINE FUMARATE 25 MG PO TABS
25.0000 mg | ORAL_TABLET | Freq: Every day | ORAL | Status: DC
  Administered 2023-06-11: 25 mg via ORAL
  Filled 2023-06-11: qty 1

## 2023-06-11 MED ORDER — LEVETIRACETAM 500 MG PO TABS
1500.0000 mg | ORAL_TABLET | Freq: Two times a day (BID) | ORAL | Status: DC
  Administered 2023-06-11 – 2023-06-16 (×11): 1500 mg via ORAL
  Filled 2023-06-11 (×11): qty 3

## 2023-06-11 MED ORDER — ENSURE MAX PROTEIN PO LIQD
11.0000 [oz_av] | Freq: Two times a day (BID) | ORAL | Status: DC
  Administered 2023-06-12 – 2023-06-15 (×6): 11 [oz_av] via ORAL
  Filled 2023-06-11 (×10): qty 330

## 2023-06-11 MED ORDER — ADULT MULTIVITAMIN W/MINERALS CH
1.0000 | ORAL_TABLET | Freq: Every day | ORAL | Status: DC
  Administered 2023-06-11 – 2023-06-16 (×6): 1 via ORAL
  Filled 2023-06-11 (×6): qty 1

## 2023-06-11 MED ORDER — SERTRALINE HCL 100 MG PO TABS
100.0000 mg | ORAL_TABLET | Freq: Every day | ORAL | Status: DC
  Administered 2023-06-11 – 2023-06-16 (×6): 100 mg via ORAL
  Filled 2023-06-11 (×6): qty 1

## 2023-06-11 MED ORDER — NYSTATIN 100000 UNIT/GM EX POWD
Freq: Three times a day (TID) | CUTANEOUS | Status: DC
  Filled 2023-06-11: qty 15

## 2023-06-11 MED ORDER — LORAZEPAM 0.5 MG PO TABS
0.5000 mg | ORAL_TABLET | Freq: Four times a day (QID) | ORAL | Status: DC | PRN
  Administered 2023-06-13 – 2023-06-14 (×2): 0.5 mg via ORAL
  Filled 2023-06-11 (×2): qty 1

## 2023-06-11 MED ORDER — INSULIN ASPART 100 UNIT/ML IJ SOLN
0.0000 [IU] | Freq: Three times a day (TID) | INTRAMUSCULAR | Status: DC
  Administered 2023-06-11: 3 [IU] via SUBCUTANEOUS
  Administered 2023-06-11: 5 [IU] via SUBCUTANEOUS
  Administered 2023-06-12: 3 [IU] via SUBCUTANEOUS
  Administered 2023-06-12: 5 [IU] via SUBCUTANEOUS
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
## 2023-06-11 NOTE — Plan of Care (Addendum)
Problem: Education: Goal: Knowledge of General Education information will improve Description: Including pain rating scale, medication(s)/side effects and non-pharmacologic comfort measures Outcome: Not Progressing Pt does not understands why she was admitted into the hospital because she currently has AMS.  Per MD's notes she has edema of LLE and is suspected to have a DVT but was r/o negative per [place].  Pt also has a productive cough for 3 days and was found to be hypoxic upon coming to the ED with PO2 in the 80s and was admitted for COPD exacerbation.    Problem: Clinical Measurements: Goal: Ability to maintain clinical measurements within normal limits will improve Outcome: Progressing Pt's VS WNL.    Problem: Clinical Measurements: Goal: Will remain free from infection Outcome: Progressing S/Sx of infection monitored and assessed q8 hours.  Pt has remained afebrile thus far.  She is on PO abx per MD's orders    Problem: Clinical Measurements: Goal: Respiratory complications will improve Outcome: Not Progressing Respiratory status monitored and assessed q4 hours.  Pt is on 4L of O2 via nasal cannula with PO2 saturations at 92-96% and respirations of 16-22 breaths per minute.  She has not endorse c/o SOB and DOE     Problem: Activity: Goal: Risk for activity intolerance will decrease Outcome: Not Progressing Pt is dependent of all her ADLs. She needs the assistance of RN staff to get her OOB and turn/ reposition herself.    Problem: Nutrition: Goal: Adequate nutrition will be maintained Outcome: Progressing Pt is on a heart healthy/ carb modified diet per MD's orders.  She has been able to tolerate her diet without s/sx of n/v or abdominal pain/ distention.   Problem: Elimination: Goal: Will not experience complications related to bowel motility Outcome: Progressing Pt LBM was in 06/09/2023.  She has not endorse c/o constipation.    Problem: Elimination: Goal: Will not  experience complications related to urinary retention Outcome: Progressing Pt has denied c/o dysuria or abdominal distention/ pain.  She has an external female catheter (purewick) in place to capture her urine.   Problem: Pain Management: Goal: General experience of comfort will improve Outcome: Progressing Pt has denied c/o pain thus far.    Problem: Safety: Goal: Ability to remain free from injury will improve Outcome: Progressing Pt has remained free from falls thus far.  Instructed pt to utilize RN call light for assistance.  Hourly rounds performed.  Bed alarm implemented to keep pt safe from falls.  Settings activated to third most sensitive mode.  Bed in lowest position, locked with two upper side rails engaged.  Belongings and call light within reach.    Problem: Skin Integrity: Goal: Risk for impaired skin integrity will decrease Outcome: Progressing Skin integrity monitored and assessed q-shift.  Pt is on q2 hourly turns to prevent further skin impairment.  Tubes and drains assessed for device related pressure sores.  Pt is incontinent of both bowel and bladder.  She is checked q2 hours for bowel incontinence.  Perineal care given promptly after each episode.  She has an external female catheter (purewick) in place to capture her urine.  Management of external female catheter (purewick) performed per [facility] policy, procedure and guidelines.

## 2023-06-11 NOTE — Evaluation (Addendum)
Clinical/Bedside Swallow Evaluation Patient Details  Name: Allison Caldwell MRN: [id number] Date of Birth: 1946-07-16  Today's Date: 06/11/2023 Time: SLP Start Time (ACUTE ONLY): 1447 SLP Stop Time (ACUTE ONLY): 1501 SLP Time Calculation (min) (ACUTE ONLY): 14 min  Past Medical History:  Past Medical History:  Diagnosis Date   Anxiety    Cardiomyopathy    Degenerative joint disease    Depression    Diabetes mellitus    NIDDM   Hypertension    Morbid obesity with BMI of 45.0-49.9, adult (HCC)    TIA (transient ischemic attack)    Past Surgical History:  Past Surgical History:  Procedure Laterality Date   CESAREAN SECTION     times 2   EYE SURGERY     laser - right eye 2010   left wrist     surgical repair   ORIF ANKLE FRACTURE     right   HPI:  Pt is a 77 y.o. female who presented with left lower extremity swelling suspected to be DVT and SOB. CT chest negative for acute cardiopulmonary disease. SLP consulted 8/11 secondary OT report of "pt observed to cough and struggle with bites of peaches and sips of coffee." Pt dx with acute metabolic encephalopathy. PMH: diabetes, hypertension, morbid obesity, TIA, anxiety disorder, degenerative joint disease, cardiomyopathy, paroxysmal atrial fibrillation on chronic anticoagulation with Eliquis. 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 MBS 04/04/23:  mild oropharyngeal dysphagia with oral  phase marked by prolonged mastication, bolus formation and transit with solid, impaired timing resulting in penetration before full laryngeal closure achieved however material exited vestibule during the swallow spontaneously (PAS 2). A dysphagia 1 diet with thin liquids was recommended at that time and this was advanced to dysphagia 2 and thin liquids on 04/05/23.    Assessment / Plan / Recommendation  Clinical Impression  Pt was seen for bedside swallow evaluation and she denied a history of dysphagia or any  recollection of difficulty earlier today. It is noteworthy that pt's RN, Maxie Better, reported that the pt's mentation is improved compared to this morning and that the pt was notably more lethargic when she had difficulty with food, liquids, and meds. Oral mechanism exam was [facility] and she presented with 4 mandibular teeth only; pt denied use of/need for dentures. Pt exhibited inconsistent throat clearing with consecutive swallows of thin liquids via straw, but this was eliminated with use of individual boluses and no other symptoms of oropharyngeal dysphagia were demonstrated with trials including peaches which were noted to be challenging earlier. A regular texture diet with thin liquids is recommended at this time with observance of swallowing precautions. Considering pt's performance earlier today, SLP will follow briefly to ensure tolerance. SLP Visit Diagnosis: Dysphagia, unspecified (R13.10)    Aspiration Risk  Mild aspiration risk    Diet Recommendation Regular;Thin liquid    Liquid Administration via: Straw;Cup Medication Administration: Whole meds with puree Supervision: Intermittent supervision to cue for compensatory strategies Compensations: Slow rate;Small sips/bites;Minimize environmental distractions (avoid consecutive swallows) Postural Changes: Seated upright at 90 degrees    Other  Recommendations Oral Care Recommendations: Oral care BID    Recommendations for follow up therapy are one component of a multi-disciplinary discharge planning process, led by the attending physician.  Recommendations may be updated based on patient status, additional functional criteria and insurance authorization.  Follow up Recommendations  (TBD)      Assistance Recommended at Discharge    Functional Status Assessment Patient has had a  recent decline in their functional status and demonstrates the ability to make significant improvements in function in a reasonable and predictable amount of time.   Frequency and Duration min 1 x/week  1 week       Prognosis Prognosis for improved oropharyngeal function: Good      Swallow Study   General Date of Onset: 06/10/23 HPI: Pt is a 77 y.o. female who presented with left lower extremity swelling suspected to be DVT and SOB. CT chest negative for acute cardiopulmonary disease. SLP consulted 8/11 secondary OT report of "pt observed to cough and struggle with bites of peaches and sips of coffee." Pt dx with acute metabolic encephalopathy. PMH: diabetes, hypertension, morbid obesity, TIA, anxiety disorder, degenerative joint disease, cardiomyopathy, paroxysmal atrial fibrillation on chronic anticoagulation with Eliquis. 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 MBS 04/04/23:  mild oropharyngeal dysphagia with oral  phase marked by prolonged mastication, bolus formation and transit with solid, impaired timing resulting in penetration before full laryngeal closure achieved however material exited vestibule during the swallow spontaneously (PAS 2). A dysphagia 1 diet with thin liquids was recommended at that time and this was advanced to dysphagia 2 and thin liquids on 04/05/23. Type of Study: Bedside Swallow Evaluation Previous Swallow Assessment: See HPI Diet Prior to this Study: Regular;Thin liquids (Level 0) Temperature Spikes Noted: No Respiratory Status: Room air History of Recent Intubation: No Behavior/Cognition: Alert;Cooperative;Pleasant mood Oral Cavity Assessment: Within Functional Limits Oral Care Completed by SLP: No Oral Cavity - Dentition: Missing dentition;Poor condition (4 total) Vision: Functional for self-feeding Self-Feeding Abilities: Needs assist Patient Positioning: Upright in bed;Postural control adequate for testing Baseline Vocal Quality: Normal Volitional Cough: Weak;Congested Volitional Swallow: Able to elicit    Oral/Motor/Sensory Function Overall Oral Motor/Sensory Function: Within  functional limits   Ice Chips Ice chips: Not tested   Thin Liquid Thin Liquid: Impaired Presentation: Straw Pharyngeal  Phase Impairments: Throat Clearing - Immediate;Throat Clearing - Delayed (w8ith consecutive swallows only)    Nectar Thick Nectar Thick Liquid: Not tested   Honey Thick Honey Thick Liquid: Not tested   Puree Puree: Within functional limits Presentation: Spoon   Solid     Solid: Within functional limits Presentation: Leota Jacobsen I. Vear Clock, MS, CCC-SLP Acute Rehabilitation Services Office number [phone]  Scheryl Marten 06/11/2023,3:10 PM

## 2023-06-11 NOTE — Evaluation (Signed)
Occupational Therapy Evaluation Patient Details Name: Allison Caldwell MRN: [id number] DOB: Jun 13, 1946 Today's Date: 06/11/2023   History of Present Illness Pt is a 77 y.o. female admitted from facility on 06/10/23 with LLE swelling, cough, hypoxia. Workup for COPD exacerbation. Negative DVT LLE. 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 Other PMH includes DM2, HTN, cardiomyopathy, morbid obesity, anxiety.   Clinical Impression   Pt admitted with the above diagnoses and presents with below problem list. Pt will benefit from continued acute OT to address the below listed deficits and maximize independence with basic ADLs prior to d/c. Pt is coming from SNF where she receives assistance with ADLs, bed mobility. Pt reports they have been working on standing PTA. Pt currently needs +2 physical assist with bed mobility, functional transfers, and LB ADLs; max A with UB ADLs, setup and supervision for feeding. Pt observed to cough and struggle with bites of peaches and sips of coffee. Recommend Speech Therapy consult.         If plan is discharge home, recommend the following: Two people to help with walking and/or transfers;Two people to help with bathing/dressing/bathroom;Assistance with feeding;Direct supervision/assist for medications management;Direct supervision/assist for financial management;Assist for transportation;Supervision due to cognitive status    Functional Status Assessment  Patient has had a recent decline in their functional status and demonstrates the ability to make significant improvements in function in a reasonable and predictable amount of time.  Equipment Recommendations  Other (comment) (defer to next venue)    Recommendations for Other Services Speech consult (swallowing)     Precautions / Restrictions Precautions Precautions: Fall;Other (comment) Precaution Comments: h/o CVA with  L-side weakness, L inattention Restrictions Weight Bearing Restrictions: No      Mobility Bed Mobility Overal bed mobility: Needs Assistance             General bed mobility comments: mod A to  bring trunk from right side of bed in supine to center position. Needs +2 assist for further bed mobility    Transfers                          Balance                                           ADL either performed or assessed with clinical judgement   ADL Overall ADL's : Needs assistance/impaired Eating/Feeding: Set up;Cueing for safety;Cueing for compensatory techinques;Bed level   Grooming: Moderate assistance;Bed level   Upper Body Bathing: Maximal assistance;Bed level   Lower Body Bathing: Total assistance;+2 for physical assistance;Bed level;+2 for safety/equipment   Upper Body Dressing : Maximal assistance;Bed level   Lower Body Dressing: Total assistance;+2 for physical assistance;+2 for safety/equipment;Bed level                 General ADL Comments: Pt requires +2 physical assist for bed mobility. Pt with s/s of aspiration while observed eating. OT requesting SLP consult for swallow eval.     Vision         Perception         Praxis         Pertinent Vitals/Pain Pain Assessment Pain Assessment: Faces Faces Pain Scale: Hurts a little bit Pain Location: unspecified Pain Descriptors / Indicators: Discomfort Pain Intervention(s): Monitored during  session, Limited activity within patient's tolerance, Repositioned     Extremity/Trunk Assessment Upper Extremity Assessment Upper Extremity Assessment: LUE deficits/detail;RUE deficits/detail;Difficult to assess due to impaired cognition RUE Deficits / Details: able to bring food to mouth using spoon. difficult to fully assess d/t cognition. LUE Deficits / Details: mild LUE neglect/inattention? Pt endorses altered sensation throughout LUE. Pt able to composite flex/extend. Pt  appears to have LUE weakness impacting functional use of LUE. Difficult to fully assess d/t cognition. LUE Sensation: decreased light touch;decreased proprioception LUE Coordination: decreased fine motor;decreased gross motor   Lower Extremity Assessment Lower Extremity Assessment: Defer to PT evaluation LLE Deficits / Details: gross hip and knee strength <3/5, ankle DF 1/5, pt unable to wiggle toes; difficult to determine extent of true weakness secondary to impaired cognition LLE Coordination: decreased gross motor;decreased fine motor       Communication Communication Communication: Difficulty communicating thoughts/reduced clarity of speech   Cognition Arousal: Alert Behavior During Therapy: Flat affect Overall Cognitive Status: No family/caregiver present to determine baseline cognitive functioning                                 General Comments: Confusion noted. Tangental responses. Difficult to follow her line of conversation. flat affect. internally distracted. Contradicting self during self reports ex equally reporting left and right hand as dominant prior to stroke. Difficulty attending to task.     General Comments  Pt with [place] out of nose upon arrival of OT. OT repositioned.    Exercises     Shoulder Instructions      Home Living Family/patient expects to be discharged to:: Skilled nursing facility                                        Prior Functioning/Environment Prior Level of Function : Patient poor historian/Family not available             Mobility Comments: Poor historian. reports they were trying to work with her on standing at [facility]. sounds like +2 for bed mobility to EOB at SNF.          OT Problem List: Decreased strength;Decreased range of motion;Decreased activity tolerance;Impaired balance (sitting and/or standing);Decreased coordination;Decreased cognition;Decreased safety awareness;Decreased knowledge of use of  DME or AE;Decreased knowledge of precautions;Impaired sensation;Impaired tone;Obesity;Impaired UE functional use;Increased edema      OT Treatment/Interventions: Self-care/ADL training;Therapeutic exercise;Neuromuscular education;DME and/or AE instruction;Therapeutic activities;Energy conservation;Cognitive remediation/compensation;Patient/family education;Balance training    OT Goals(Current goals can be found in the care plan section) Acute Rehab OT Goals Patient Stated Goal: not stated OT Goal Formulation: With patient Time For Goal Achievement: 06/25/23 Potential to Achieve Goals: Fair  OT Frequency: Min 2X/week    Co-evaluation              AM-PAC OT "6 Clicks" Daily Activity     Outcome Measure Help from another person eating meals?: A Little Help from another person taking care of personal grooming?: A Lot Help from another person toileting, which includes using toliet, bedpan, or urinal?: Total Help from another person bathing (including washing, rinsing, drying)?: Total Help from another person to put on and taking off regular upper body clothing?: A Lot Help from another person to put on and taking off regular lower body clothing?: Total 6 Click Score: 10   End of Session  Nurse Communication: Other (comment) (s/s aspiration eating, recommend SLP consult for swallowing)  Activity Tolerance: Patient limited by fatigue Patient left: in bed;with call bell/phone within reach;with bed alarm set  OT Visit Diagnosis: Unsteadiness on feet (R26.81);Other abnormalities of gait and mobility (R26.89);Muscle weakness (generalized) (M62.81);Ataxia, unspecified (R27.0);Other symptoms and signs involving the nervous system (R29.898);Other symptoms and signs involving cognitive function;Cognitive communication deficit (R41.841);Hemiplegia and hemiparesis                Time: 1101-1117 OT Time Calculation (min): 16 min Charges:  OT General Charges $OT Visit: 1 Visit OT Evaluation $OT  Eval Moderate Complexity: 1 Mod  Raynald Kemp, OT Acute Rehabilitation Services Office: [phone]   Pilar Grammes 06/11/2023, 11:45 AM

## 2023-06-11 NOTE — Progress Notes (Signed)
Initial Nutrition Assessment  DOCUMENTATION CODES:   Morbid obesity  INTERVENTION:  - Add Ensure Max po BID, each supplement provides 150 kcal and 30 grams of protein.   - Add MVI q day.   NUTRITION DIAGNOSIS:   Inadequate oral intake related to poor appetite as evidenced by meal completion < 25%.  GOAL:   Patient will meet greater than or equal to 90% of their needs  MONITOR:   PO intake, Supplement acceptance  REASON FOR ASSESSMENT:   Consult Assessment of nutrition requirement/status  ASSESSMENT:   77 y.o. female admits related to SOB. PMH includes: cardiomyopathy, DM, TIA. Pt is currently receiving medical management related to acute hypoxemic respiratory failure.  Meds reviewed:  sliding scale insulin, semglee. Labs reviewed: Chloride low.   RD unable to reach pt via phone. Pt is currently oriented x2. Per record, pt has eaten 0% of her meals. Pt has experienced an 8% wt loss in 2 months. RD is unsure if this was intentional or unintentional. RD will continue to monitor PO intakes. Will add Ensure Max BID for now.   NUTRITION - FOCUSED PHYSICAL EXAM:  Remote assessment.  Diet Order:   Diet Order             Diet heart healthy/carb modified Room service appropriate? No; Fluid consistency: Thin  Diet effective now                   EDUCATION NEEDS:   Not appropriate for education at this time  Skin:  Skin Assessment: Reviewed RN Assessment  Last BM:  06/09/23  Height:   Ht Readings from Last 1 Encounters:  06/10/23 5\' 2"  (1.575 m)    Weight:   Wt Readings from Last 1 Encounters:  06/10/23 116.7 kg    Ideal Body Weight:     BMI:  Body mass index is 47.06 kg/m.  Estimated Nutritional Needs:   Kcal:  2300-2500 kcals  Protein:  115-125 gm  Fluid:  >/= 2.3 L  Bethann Humble, RD, LDN, CNSC.

## 2023-06-11 NOTE — Progress Notes (Signed)
ABG sample walked to lab and handed to staff. RT will continue to monitor.

## 2023-06-11 NOTE — Progress Notes (Addendum)
PROGRESS NOTE    Allison Caldwell  AVW:[id number] DOB: [birth year] DOA: 06/10/2023 PCP: Morrie Sheldon, MD     Brief Narrative:  Allison Caldwell is a 77 y.o. female with medical history significant of diabetes, hypertension, morbid obesity, history of TIA, anxiety disorder, degenerative joint disease, cardiomyopathy, paroxysmal atrial fibrillation on chronic anticoagulation with Eliquis who presented with left lower extremity swelling suspected to be DVT.  Patient started having cough for the last 3 days.  The cough was productive of some white sputum.  No shortness of breath but when EMS arrived her oxygen sats was in the 80s.  Patient was placed on oxygen.  She lives in a facility and was brought to the ER.  Workup for her left leg swelling was negative for DVT.  The leg apparently has been swollen and painful for months.  Patient apparently had a TIA in May.  Patient was subsequently found to have acute hypoxemia.  She is wheezing and therefore suspected to have acute exacerbation of COPD.  Patient being admitted for further evaluation and treatment.   New events last 24 hours / Subjective: Patient very lethargic today.  She is arousable, but easy to fall asleep.  Difficult to understand, although she does attempt to answer questions.  She has significant cough during exam.  Patient re-evaluated this afternoon. She is alert, talkative and oriented to self and place. Asking for underwear. Mentation much improved from this morning   Assessment & Plan:   Principal Problem:   Acute hypoxemic respiratory failure (HCC) Active Problems:   Diabetes mellitus type 2 with complications (HCC)   Hyperlipidemia associated with type 2 diabetes mellitus (HCC)   Morbid obesity with BMI of 45.0-49.9, adult (HCC)   ANXIETY DEPRESSION   Hypertension associated with diabetes (HCC)   Hyponatremia   Paroxysmal atrial fibrillation (HCC)   Acute hypoxic respiratory failure -Suspected COPD  exacerbation -Patient was found to have saturation in the 80s by EMS -COVID test negative -Chest x-ray unremarkable -CTA chest negative for PE -Echocardiogram May 2024 revealed EF 50 to 55% -Currently requiring 4 L nasal cannula O2 -Breathing tx, doxycycline, Breo/Incruse, Solu-Medrol--> prednisone  Acute metabolic encephalopathy -Seems to be very sleepy today, arousable but quick to fall asleep  -ABG 7.46 / 44 / 59 -Check CT head -Patient was prescribed Seroquel 50 mg daily at discharge in June.  Hold off on further doses in setting of lethargy today  Seizure disorder -Keppra, Vimpat, Depakene  Diabetes mellitus, with hyperglycemia -Semglee, sliding scale insulin  Hypertension -Norvasc, Cozaar  Paroxysmal A-fib -Eliquis, Cardizem, Toprol  Hyperlipidemia -Pravachol  Morbid obesity -BMI 47  History of hemorrhagic stroke -Admitted in June 2024 for right MCA/ACA stroke with hemorrhagic conversion.  Stroke was likely secondary to embolic source from A-fib.  She was discharged home on Eliquis after stable CT  DVT prophylaxis:  apixaban (ELIQUIS) tablet 5 mg  Code Status: Full code Family Communication: No family at bedside Disposition Plan: From [facility], patient is a LTC resident Status is: Inpatient Remains inpatient appropriate because: Hypoxia, encephalopathy  Upon further review, appears that patient is under care of IM teaching service.  I will contact them to take over care starting tomorrow 8/12.    Antimicrobials:  Anti-infectives (From admission, onward)    Start     Dose/Rate Route Frequency Ordered Stop   06/10/23 2200  doxycycline (VIBRA-TABS) tablet 100 mg        100 mg Oral Every 12 hours 06/10/23 2027 06/15/23 2159  Objective: Vitals:   06/11/23 0438 06/11/23 0745 06/11/23 0930 06/11/23 1038  BP: 116/80 119/75    Pulse: 97 79    Resp: 20 16    Temp: 99.1 F (37.3 C) 97.9 F (36.6 C)    TempSrc:      SpO2: 97% 95% 93% 96%   Weight:      Height:        Intake/Output Summary (Last 24 hours) at 06/11/2023 1301 Last data filed at 06/11/2023 0854 Gross per 24 hour  Intake 1515.91 ml  Output --  Net 1515.91 ml   Filed Weights   06/10/23 2029  Weight: 116.7 kg    Examination:  General exam: Appears calm and comfortable, fatigued and lethargic but arousable Respiratory system: Clear to auscultation anteriorly, without distress Cardiovascular system: S1 & S2 heard, RRR. No murmurs. No pedal edema. Gastrointestinal system: Abdomen is nondistended, soft and nontender. Normal bowel sounds heard. Central nervous system: Alert to voice Extremities: Symmetric in appearance  Skin: No rashes, lesions or ulcers on exposed skin   Data Reviewed: I have personally reviewed following labs and imaging studies  CBC: Recent Labs  Lab 06/10/23 1625 06/11/23 0308  WBC 10.6* 9.2  NEUTROABS 6.1  --   HGB 12.8 12.7  HCT 38.6 37.7  MCV 93.0 90.0  PLT 205 202   Basic Metabolic Panel: Recent Labs  Lab 06/10/23 1625 06/11/23 0308  NA 138 138  K 4.4 4.0  CL 97* 96*  CO2 27 26  GLUCOSE 169* 254*  BUN 18 18  CREATININE 0.63 0.65  CALCIUM 8.9 8.9   GFR: Estimated Creatinine Clearance: 69 mL/min (by C-G formula based on SCr of 0.65 mg/dL). Liver Function Tests: Recent Labs  Lab 06/10/23 1625 06/11/23 0308  AST 22 15  ALT 16 12  ALKPHOS 53 53  BILITOT 0.8 0.4  PROT 7.1 7.2  ALBUMIN 2.9* 2.7*   No results for input(s): "LIPASE", "AMYLASE" in the last 168 hours. No results for input(s): "AMMONIA" in the last 168 hours. Coagulation Profile: No results for input(s): "INR", "PROTIME" in the last 168 hours. Cardiac Enzymes: No results for input(s): "CKTOTAL", "CKMB", "CKMBINDEX", "TROPONINI" in the last 168 hours. BNP (last 3 results) No results for input(s): "PROBNP" in the last 8760 hours. HbA1C: No results for input(s): "HGBA1C" in the last 72 hours. CBG: Recent Labs  Lab 06/11/23 0744  06/11/23 1146  GLUCAP 272* 282*   Lipid Profile: No results for input(s): "CHOL", "HDL", "LDLCALC", "TRIG", "CHOLHDL", "LDLDIRECT" in the last 72 hours. Thyroid Function Tests: No results for input(s): "TSH", "T4TOTAL", "FREET4", "T3FREE", "THYROIDAB" in the last 72 hours. Anemia Panel: No results for input(s): "VITAMINB12", "FOLATE", "FERRITIN", "TIBC", "IRON", "RETICCTPCT" in the last 72 hours. Sepsis Labs: No results for input(s): "PROCALCITON", "LATICACIDVEN" in the last 168 hours.  Recent Results (from the past 240 hour(s))  SARS Coronavirus 2 by RT PCR (hospital order, performed in [facility] hospital lab) *cepheid single result test* Anterior Nasal Swab     Status: None   Collection Time: 06/10/23  4:27 PM   Specimen: Anterior Nasal Swab  Result Value Ref Range Status   SARS Coronavirus 2 by RT PCR NEGATIVE NEGATIVE Final    Comment: Performed at [facility], [address], [place], [place] [address]      Radiology Studies: VAS [place] LOWER EXTREMITY VENOUS (DVT) (7a-7p)  Result Date: 06/11/2023  Lower Venous DVT Study Patient Name:  Allison Caldwell  Date of Exam:  06/10/2023 Medical Rec #: [id number]              Accession #:    [id number] Date of Birth: 10-Jun-1946              Patient Gender: F Patient Age:   37 years Exam Location:  [facility] Procedure:      VAS [place] LOWER EXTREMITY VENOUS (DVT) Referring Phys: Lorin Picket [place] --------------------------------------------------------------------------------  Indications: Edema.  Comparison Study: No prior study on file Performing Technologist: Sherren Kerns RVS  Examination Guidelines: A complete evaluation includes B-mode imaging, spectral Doppler, color Doppler, and power Doppler as needed of all accessible portions of each vessel. Bilateral testing is considered an integral part of a complete examination. Limited examinations for reoccurring indications may be performed as noted. The reflux portion of the exam  is performed with the patient in reverse Trendelenburg.  +-----+---------------+---------+-----------+-------------------+--------------+ RIGHTCompressibilityPhasicitySpontaneityProperties         Thrombus Aging +-----+---------------+---------+-----------+-------------------+--------------+ CFV  Full                               pulsatile waveforms               +-----+---------------+---------+-----------+-------------------+--------------+   +--------+---------------+---------+-----------+----------------+-------------+ LEFT    CompressibilityPhasicitySpontaneityProperties      Thrombus                                                                 Aging         +--------+---------------+---------+-----------+----------------+-------------+ CFV     Full                               pulsatile                                                                waveforms                     +--------+---------------+---------+-----------+----------------+-------------+ SFJ     Full                                                             +--------+---------------+---------+-----------+----------------+-------------+ FV Prox Full                                                             +--------+---------------+---------+-----------+----------------+-------------+ FV Mid  Full                                                             +--------+---------------+---------+-----------+----------------+-------------+  FV      Full                                                             Distal                                                                   +--------+---------------+---------+-----------+----------------+-------------+ PFV     Full                                                             +--------+---------------+---------+-----------+----------------+-------------+ POP     Full                                pulsatile                                                                waveforms                     +--------+---------------+---------+-----------+----------------+-------------+ PTV     Full                                                             +--------+---------------+---------+-----------+----------------+-------------+ PERO    Full                                                             +--------+---------------+---------+-----------+----------------+-------------+     Summary: RIGHT: - No evidence of common femoral vein obstruction.  LEFT: - There is no evidence of deep vein thrombosis in the lower extremity.  Pulsatile waveforms.  *See table(s) above for measurements and observations. Electronically signed by Gerarda Fraction on 06/11/2023 at 9:23:31 AM.    Final    CT Angio Chest PE W and/or Wo Contrast  Result Date: 06/10/2023 CLINICAL DATA:  Cough, evaluate for PE EXAM: CT ANGIOGRAPHY CHEST WITH CONTRAST TECHNIQUE: Multidetector CT imaging of the chest was performed using the standard protocol during bolus administration of intravenous contrast. Multiplanar CT image reconstructions and MIPs were obtained to evaluate the vascular anatomy. RADIATION DOSE REDUCTION: This exam was performed according to the departmental dose-optimization program which includes automated exposure control, adjustment of the mA and/or kV according to patient size  and/or use of iterative reconstruction technique. CONTRAST:  75mL OMNIPAQUE IOHEXOL 350 MG/ML SOLN COMPARISON:  Chest radiograph dated 18/2 1,024. CTA chest dated 09/22/2022. FINDINGS: Cardiovascular: Satisfactory opacification of the bilateral pulmonary arteries to the segmental level. No evidence of pulmonary embolism. Again noted is a dilated main pulmonary artery, compatible with pulmonary arterial hypertension. Study is not tailored for evaluation of the thoracic aorta. No evidence of thoracic aortic aneurysm. Mild  atherosclerotic calcifications of the descending thoracic aorta. The heart is normal in size. Prominent left epicardial fat. No pericardial effusion. Mild three-vessel coronary atherosclerosis. Mediastinum/Nodes: No suspicious mediastinal lymphadenopathy. Visualized thyroid is unremarkable. Lungs/Pleura: Evaluation of the lung parenchyma is constrained by respiratory motion. Within that constraint, there are no suspicious pulmonary nodules. Scattered atelectasis in the lungs bilaterally. No focal consolidation. No pleural effusion or pneumothorax. Upper Abdomen: Visualized upper abdomen is grossly unremarkable, noting mild vascular calcifications. Musculoskeletal: Degenerative changes of the visualized thoracolumbar spine. Review of the MIP images confirms the above findings. IMPRESSION: No evidence of pulmonary embolism. Suspected pulmonary arterial hypertension. No acute cardiopulmonary disease. Aortic Atherosclerosis (ICD10-I70.0). Electronically Signed   By: Charline Bills M.D.   On: 06/10/2023 19:47   DG Chest 2 View  Result Date: 06/10/2023 CLINICAL DATA:  Cough EXAM: CHEST - 2 VIEW COMPARISON:  04/18/2023 FINDINGS: Stable cardiomegaly. Aortic atherosclerosis. No focal airspace consolidation, pleural effusion, or pneumothorax. IMPRESSION: Cardiomegaly. No acute cardiopulmonary findings. Electronically Signed   By: Duanne Guess D.O.   On: 06/10/2023 16:57      Scheduled Meds:  amLODipine  10 mg Oral Daily   apixaban  5 mg Oral BID   diltiazem (CARDIZEM CD) 24 hr capsule 420 mg  420 mg Oral Daily   doxycycline  100 mg Oral Q12H   fluticasone furoate-vilanterol  1 puff Inhalation Daily   And   umeclidinium bromide  1 puff Inhalation Daily   insulin aspart  0-9 Units Subcutaneous TID WC   insulin glargine-yfgn  13 Units Subcutaneous QHS   ipratropium-albuterol  3 mL Nebulization Q6H   lacosamide  200 mg Oral BID   levETIRAcetam  1,500 mg Oral BID   losartan  100 mg Oral Daily    methylPREDNISolone (SOLU-MEDROL) injection  40 mg Intravenous Q12H   Followed by   Melene Muller ON 06/12/2023] predniSONE  40 mg Oral Q breakfast   metoprolol succinate  100 mg Oral Daily   nystatin   Topical TID   pravastatin  20 mg Oral Daily   QUEtiapine  25 mg Oral Daily   QUEtiapine  50 mg Oral QHS   sertraline  100 mg Oral Daily   valproic acid  500 mg Oral Q8H   Continuous Infusions:   LOS: 1 day   Time spent: 45 minutes   Noralee Stain, DO [facility] 06/11/2023, 1:01 PM   Available via Epic secure chat 7am-7pm After these hours, please refer to coverage provider listed on [email or web address]

## 2023-06-11 NOTE — Progress Notes (Signed)
ABG results given to Dr. Alvino Chapel. Pt has already been placed on 4L nasal cannula post abg. No further orders received, RT will continue to monitor and be available as needed.

## 2023-06-11 NOTE — Plan of Care (Signed)
Pt alert and oriented x 4 but forgetful. Lung sounds clear and diminished. Oxygen at 2 L/[place] acute sating 95-100%. Resp 20 this am. Nebs given by respiratory. Vitals stable. Pt is incontinent had brief on upon arrival to unit. Purewick placed this am due to incontinent and limited mobility. Pt turned approx every 2 hours 1 assist by RN. Heels elevated on pillow. Edema noted to BLE with limited movement tolerated. Tele Afib hx of afib. On eliquis for dvt prevention. Pt tolerating Heart healthy/ carb mod diet and had snack at bedtime. Meds whole 2 at a time. Pt needs encouragement and direction with taking meds. No signs of dysphagia noted. BS x 4. Last bm 8/9 per pt but poor historian.  Problem: Education: Goal: Knowledge of General Education information will improve Description: Including pain rating scale, medication(s)/side effects and non-pharmacologic comfort measures Outcome: Progressing   Problem: Health Behavior/Discharge Planning: Goal: Ability to manage health-related needs will improve Outcome: Progressing   Problem: Clinical Measurements: Goal: Ability to maintain clinical measurements within normal limits will improve Outcome: Progressing Goal: Will remain free from infection Outcome: Progressing Goal: Diagnostic test results will improve Outcome: Progressing Goal: Respiratory complications will improve Outcome: Progressing Goal: Cardiovascular complication will be avoided Outcome: Progressing   Problem: Activity: Goal: Risk for activity intolerance will decrease Outcome: Progressing   Problem: Nutrition: Goal: Adequate nutrition will be maintained Outcome: Progressing   Problem: Coping: Goal: Level of anxiety will decrease Outcome: Progressing   Problem: Elimination: Goal: Will not experience complications related to bowel motility Outcome: Progressing Goal: Will not experience complications related to urinary retention Outcome: Progressing   Problem: Pain  Managment: Goal: General experience of comfort will improve Outcome: Progressing   Problem: Safety: Goal: Ability to remain free from injury will improve Outcome: Progressing   Problem: Skin Integrity: Goal: Risk for impaired skin integrity will decrease Outcome: Progressing

## 2023-06-11 NOTE — Hospital Course (Addendum)
Allison Caldwell is a 77 yo female with HTN, DM, obesity, previous TIA, anxiety, DDD, paroxysmal Afib on anticoagulation, and cardiomyopathy who presented with LLE swelling with suspicion for DVT, but admitted for acute hypoxic respiratory failure.   Acute hypoxic respiratory failure Presented with O2 sat in the 80s per EMS, COVID negative, CXR negative. Lower extremity ultrasonud negative for DVT bilaterally, thus low suspicion that his patient has a PE (additionally, CTA was negative). She has no formal diagnosis of COPD, but is being treated for a COPD exacerbation. She was on 2L [place] on hospital day 1-2 but weaned off O2 to room air on hospital day 4 and satting well. She does not use supplemental O2 at home. The patient received two doses IV solumedrol in combination with prednisone 40 mg daily and doxycycline 100 mg bid x 5 days. She is also receiving duonebs, Breo Ellipta, and Incruse Ellipta. CXR ordered for continued cough and wheezing despite steroids and abx. CXR showed enlarged pulmonary artery, pulmonary vascular congestion, and stable cardiomegaly. Pt getting guaifenesin for cough. Incentive spirometer introduced to pt. Pt still having wheezing to auscultation on hospital day 5 and prednisone 40mg  continued for two more days.   Pulmonary HTN CXR showed enlarged pulmonary artery, pulmonary vascular congestion, and cardiomegaly. TTE in May with normal right ventricle systolic normal and size. Treating COPD.  Acute metabolic encephalopathy The patient was noted to be more somonlent on hospital day 2. ABG with mild metabolic alkalosis at 7.46 / 44 / 59. CT head was negative for acute intracranial pathology but did show encephalomalacia in R frontal and parietal lobes, consistent with prior infarct. Somnolence could be secondary to seroquel, which was discontinued and patient became more alert and oriented. Pt alert and oriented to self, year, place, and situation; however, unable to do simple  math. Called her son, Allison Caldwell, to ask about her baseline and this is her baseline mental status since her stroke 2 months ago. He reports that she seems confused often and will say things that do not make sense.   Hypokalemia Hypomagnesemia  Potassium low at 3.4 and repleted and remained low at 3.4 the next day. Mg was checked and low at 1.4.  Repleted with one dose PO K and IV Mg 4g. Continue to monitor and replete as needed   Seizure disorder On Keppra 1500 mg bid, vimpat 200 mg bid, and depakene 500 mg q8h.   Type 2 diabetes Last A1c in May elevated to 8.8%. On semglee 13u daily and SSI.   Hypertension On amlodipine 10 mg, losartan 100 mg, and metoprolol 100 mg. BP stable.  Paroxysmal Afib On metoprolol 100 mg, diltiazem 420 mg daily, and eliquis 5 mg bid. HR stable.  HLD Pravachol  Anxiety Continue sertraline   Tension Headache Acetaminophen PRN    08/12: Patient evaluated at bedside, laying comfortably. She is currently on 4L nasal cannula. Patient is alert and oriented to name, location, year, and event. Patient is unable to do proper math. Patient is weaned to 2.5 L  PE: +wheezing  8/13: Pt evaluated at bedside. Coughing up phlegm. Headache new this morning. Pt weaned from 2L [place] to 1L [place] and O2sat at 92%. Alert and oriented to name, place, year, and situation.  PE: +wheezing. +cough. No LE edema  8/14: laying in bed, coughing.   PE: +wheezing but improved from previous. Coughing frequently.   8/15: complaining of cough and difficulty getting PRN guaifenesin. Complaining of left LE swelling and left sided  loss of sensation.  PE: wheezing worsened from yesterday. No crackles.   8/16: Pt evaluated at bedside. Pt denies any chest pain. Reports mild right sided abdominal pain. Reports LE pain. Pt reports overall improved today.   Lungs sound much better.

## 2023-06-11 NOTE — TOC Initial Note (Signed)
Transition of Care [facility]) - Initial/Assessment Note    Patient Details  Name: Allison Caldwell MRN: [id number] Date of Birth: 10-03-1946  Transition of Care [facility]) CM/SW Contact:    Deatra Robinson, [place] Phone Number: 06/11/2023, 12:21 PM  Clinical Narrative: pt admitted from [facility]. Spoke to Gardner in admissions who confirmed pt is a LTC resident and able to return at dc. SW will follow.   Dellie Burns, MSW, LCSW [phone] (coverage)                    Expected Discharge Plan: Skilled Nursing Facility Barriers to Discharge: Continued Medical Work up   Patient Goals and CMS Choice            Expected Discharge Plan and Services                                              Prior Living Arrangements/Services              Need for Family Participation in Patient Care: Yes (Comment) Care giver support system in place?: Yes (comment)      Activities of Daily Living Home Assistive Devices/Equipment: Cane (specify quad or straight), Eyeglasses, Wheelchair, Environmental consultant (specify type) ADL Screening (condition at time of admission) Patient's cognitive ability adequate to safely complete daily activities?: Yes Is the patient deaf or have difficulty hearing?: Yes Does the patient have difficulty seeing, even when wearing glasses/contacts?: No Does the patient have difficulty concentrating, remembering, or making decisions?: Yes Patient able to express need for assistance with ADLs?: Yes Does the patient have difficulty dressing or bathing?: Yes Independently performs ADLs?: No Communication: Independent Dressing (OT): Needs assistance Is this a change from baseline?: Pre-admission baseline Grooming: Needs assistance Is this a change from baseline?: Pre-admission baseline Feeding: Independent Bathing: Needs assistance Is this a change from baseline?: Pre-admission baseline Toileting: Needs assistance Is this a change from baseline?:  Pre-admission baseline In/Out Bed: Needs assistance Is this a change from baseline?: Pre-admission baseline Walks in Home: Needs assistance Is this a change from baseline?: Pre-admission baseline Does the patient have difficulty walking or climbing stairs?: Yes Weakness of Legs: Both Weakness of Arms/Hands: None  Permission Sought/Granted                  Emotional Assessment       Orientation: : Oriented to Self, Oriented to Place Alcohol / Substance Use: Not Applicable Psych Involvement: No (comment)  Admission diagnosis:  Acute bronchitis, unspecified organism [J20.9] Acute hypoxemic respiratory failure (HCC) [J96.01] Patient Active Problem List   Diagnosis Date Noted   Acute hypoxemic respiratory failure (HCC) 06/10/2023   Focal seizure (HCC) 04/03/2023   Paroxysmal atrial fibrillation (HCC) 04/03/2023   Delirium 04/03/2023   Dysphagia 04/03/2023   Hemorrhagic stroke (HCC) 03/25/2023   Acute hypoxic respiratory failure (HCC) 09/23/2022   Acute bronchitis due to respiratory syncytial virus (RSV) 09/23/2022   Pulmonary hypertension (HCC) 09/23/2022   Hypokalemia 05/07/2022   Bilateral hearing loss due to cerumen impaction 05/27/2021   Peeling skin 05/27/2021   Systolic murmur 09/28/2020   Benign paroxysmal positional vertigo 04/05/2018   Elevated rheumatoid factor 10/16/2016   Migratory pain 10/10/2016   Vitiligo 05/05/2016   Hypomagnesemia 04/29/2014   Hyponatremia 11/20/2011    Class: Acute   Hypertension associated with diabetes (HCC) 05/12/2010   ANXIETY DEPRESSION  04/06/2010   GLAUCOMA 01/20/2009   Hyperlipidemia associated with type 2 diabetes mellitus (HCC) 11/20/2008   Diabetes mellitus type 2 with complications (HCC) 12/27/2006   Morbid obesity with BMI of 45.0-49.9, adult (HCC) 12/27/2006   PCP:  Morrie Sheldon, MD Pharmacy:   [facility] [address] ([place]), [place] - [address] [place] ([place]) [place]  [address] Phone: [phone] Fax: [phone]  [facility] - [place], [place] - [address] [place] [address] Phone: [phone] Fax: [phone]  [facility] [address] [place] [address] Phone: [phone] Fax: [phone]     Social Determinants of Health (SDOH) Social History: SDOH Screenings   Food Insecurity: No Food Insecurity (06/10/2023)  Housing: Low Risk  (06/10/2023)  Transportation Needs: No Transportation Needs (06/10/2023)  Utilities: Not At Risk (06/10/2023)  Depression (PHQ2-9): Medium Risk (06/29/2021)  Tobacco Use: Medium Risk (06/10/2023)   SDOH Interventions:     Readmission Risk Interventions     No data to display

## 2023-06-12 DIAGNOSIS — J9601 Acute respiratory failure with hypoxia: Secondary | ICD-10-CM | POA: Diagnosis not present

## 2023-06-12 DIAGNOSIS — Z87891 Personal history of nicotine dependence: Secondary | ICD-10-CM | POA: Diagnosis not present

## 2023-06-12 LAB — BLOOD GAS, ARTERIAL
Acid-Base Excess: 7.1 mmol/L — ABNORMAL HIGH (ref 0.0–2.0)
Bicarbonate: 32 mmol/L — ABNORMAL HIGH (ref 20.0–28.0)
Drawn by: 244901
O2 Saturation: 99.3 %
Patient temperature: 36.6
pCO2 arterial: 44 mmHg (ref 32–48)
pH, Arterial: 7.47 — ABNORMAL HIGH (ref 7.35–7.45)
pO2, Arterial: 81 mmHg — ABNORMAL LOW (ref 83–108)

## 2023-06-12 LAB — GLUCOSE, CAPILLARY
Glucose-Capillary: 229 mg/dL — ABNORMAL HIGH (ref 70–99)
Glucose-Capillary: 206 mg/dL — ABNORMAL HIGH (ref 70–99)
Glucose-Capillary: 291 mg/dL — ABNORMAL HIGH (ref 70–99)
Glucose-Capillary: 260 mg/dL — ABNORMAL HIGH (ref 70–99)
Glucose-Capillary: 200 mg/dL — ABNORMAL HIGH (ref 70–99)

## 2023-06-12 MED ORDER — INSULIN ASPART 100 UNIT/ML IJ SOLN
0.0000 [IU] | Freq: Three times a day (TID) | INTRAMUSCULAR | Status: DC
  Administered 2023-06-12: 8 [IU] via SUBCUTANEOUS
  Administered 2023-06-13: 2 [IU] via SUBCUTANEOUS
  Administered 2023-06-13: 3 [IU] via SUBCUTANEOUS
  Administered 2023-06-13: 8 [IU] via SUBCUTANEOUS
  Administered 2023-06-14: 5 [IU] via SUBCUTANEOUS
  Administered 2023-06-14: 3 [IU] via SUBCUTANEOUS
  Administered 2023-06-14: 5 [IU] via SUBCUTANEOUS
  Administered 2023-06-15: 2 [IU] via SUBCUTANEOUS
  Administered 2023-06-15: 11 [IU] via SUBCUTANEOUS
  Administered 2023-06-15: 8 [IU] via SUBCUTANEOUS
  Administered 2023-06-16: 3 [IU] via SUBCUTANEOUS
  Administered 2023-06-16: 8 [IU] via SUBCUTANEOUS

## 2023-06-12 MED ORDER — IPRATROPIUM-ALBUTEROL 0.5-2.5 (3) MG/3ML IN SOLN
3.0000 mL | RESPIRATORY_TRACT | Status: DC
  Administered 2023-06-12 – 2023-06-13 (×5): 3 mL via RESPIRATORY_TRACT
  Filled 2023-06-12 (×7): qty 3

## 2023-06-12 NOTE — Progress Notes (Signed)
HD#2 SUBJECTIVE:  Patient Summary: Allison Caldwell is a 77 y.o. with a pertinent PMH of diabetes, hypertension, obesity, previous TIA, anxiety, paroxysmal afib on eliquis, cardiomyopathy, and DDD, who presented with LLE swelling/concern for DVT and admitted for acute hypoxic respiratory failure.   Overnight Events: No acute events overnight  Interim History: The patient was seen at the bedside. She still feels quite wheezy and is having a cough. Denies any chest pain.   OBJECTIVE:  Vital Signs: Vitals:   06/11/23 2108 06/11/23 2118 06/11/23 2323 06/12/23 0400  BP:   112/83 114/78  Pulse:   63 68  Resp:    16  Temp:   97.6 F (36.4 C) 98 F (36.7 C)  TempSrc:    Oral  SpO2: 92% 95% 96% 100%  Weight:      Height:       Supplemental O2: Nasal Cannula SpO2: 100 % O2 Flow Rate (L/min): 4 L/min  Filed Weights   06/10/23 2029  Weight: 116.7 kg     Intake/Output Summary (Last 24 hours) at 06/12/2023 0601 Last data filed at 06/11/2023 1850 Gross per 24 hour  Intake 886.35 ml  Output 300 ml  Net 586.35 ml   Net IO Since Admission: 1,432.26 mL [06/12/23 0601]  Physical Exam: General: Chronically ill appearing female laying in bed. No acute distress. CV: RRR. No murmurs. No LE edema Pulmonary: Significant wheezing in all lung fields, but normal work of breathing on nasal cannula Abdominal: Soft, nontender, nondistended. Skin: Warm and dry.  Neuro: A&Ox3. No focal deficit. Psych: Normal mood and affect     ASSESSMENT/PLAN:  Assessment: Principal Problem:   Acute hypoxemic respiratory failure (HCC) Active Problems:   Diabetes mellitus type 2 with complications (HCC)   Hyperlipidemia associated with type 2 diabetes mellitus (HCC)   Morbid obesity with BMI of 45.0-49.9, adult (HCC)   ANXIETY DEPRESSION   Hypertension associated with diabetes (HCC)   Hyponatremia   Paroxysmal atrial fibrillation (HCC)   Plan: Acute hypoxic respiratory failure Presented  with O2 sat in the 80s per EMS, COVID negative, CXR negative. Lower extremity ultrasonud negative for DVT bilaterally, thus low suspicion that his patient has a PE (additionally, CTA was negative). She has no formal diagnosis of COPD, but has been suspected to have it for years (and is on Trelegy at home) and is being treated for a COPD exacerbation. She is requiring 2-4 L [place], and does not use supplemental O2 at home. The patient received IV solumedrol yesterday and now is on prednisone 40 mg daily, in addition to doxycycline 100 mg bid x 5 days.  - Continue prednisone and doxycyline for 5 day course - Wean O2 as able - Increase Duonebs to q4h - Continue Breo Ellipta and Incruse Ellipta  - ABG  Acute metabolic encephalopathy The patient was noted to be more somonlent yesterday. ABG 7.46 / 44 / 59. CT head was negative for acute intracranial pathology but did show encephalomalacia in R frontal and parietal lobes, consistent with prior infarct. Somnolence could be secondary to seroquel, which was discontinued yesterday. She was more awake today, but will repeat an ABG as noted above.  Seizure disorder On Keppra 1500 mg bid, vimpat 200 mg bid, and depakene 500 mg q8h.   Type 2 diabetes Last A1c in May elevated to 8.8%. On semglee 13u daily and SSI. CBGs in 200s.   Hypertension On amlodipine 10 mg, losartan 100 mg, and metoprolol 100 mg.   Paroxysmal Afib On  metoprolol 100 mg, diltiazem 420 mg daily, and eliquis 5 mg bid.   HLD Pravastatin 20 mg daily  Best Practice: Diet: Diabetic diet IVF: Fluids: none VTE: Eliquis Code: Full AB: doxycyline Therapy Recs: SNF Family Contact: Son, Madelaine Bhat, to be notified. DISPO: Anticipated discharge in 1-3 days to Skilled nursing facility/[facility] pending  O2 requirement/medical stability .  Signature: Elza Rafter, D.O.  Internal Medicine Resident, PGY-3 [facility]  Pager: [phone] 6:01 AM, 06/12/2023   Please  contact the on call pager after 5 pm and on weekends at [phone].

## 2023-06-12 NOTE — Plan of Care (Signed)

## 2023-06-13 DIAGNOSIS — Z87891 Personal history of nicotine dependence: Secondary | ICD-10-CM | POA: Diagnosis not present

## 2023-06-13 DIAGNOSIS — J9601 Acute respiratory failure with hypoxia: Secondary | ICD-10-CM | POA: Diagnosis not present

## 2023-06-13 LAB — GLUCOSE, CAPILLARY
Glucose-Capillary: 175 mg/dL — ABNORMAL HIGH (ref 70–99)
Glucose-Capillary: 135 mg/dL — ABNORMAL HIGH (ref 70–99)
Glucose-Capillary: 152 mg/dL — ABNORMAL HIGH (ref 70–99)
Glucose-Capillary: 295 mg/dL — ABNORMAL HIGH (ref 70–99)
Glucose-Capillary: 291 mg/dL — ABNORMAL HIGH (ref 70–99)

## 2023-06-13 MED ORDER — IPRATROPIUM-ALBUTEROL 0.5-2.5 (3) MG/3ML IN SOLN
3.0000 mL | Freq: Three times a day (TID) | RESPIRATORY_TRACT | Status: DC
  Administered 2023-06-13 – 2023-06-15 (×6): 3 mL via RESPIRATORY_TRACT
  Filled 2023-06-13 (×6): qty 3

## 2023-06-13 MED ORDER — ACETAMINOPHEN 325 MG PO TABS
650.0000 mg | ORAL_TABLET | Freq: Four times a day (QID) | ORAL | Status: DC | PRN
  Administered 2023-06-13 – 2023-06-15 (×2): 650 mg via ORAL
  Filled 2023-06-13 (×2): qty 2

## 2023-06-13 MED ORDER — POTASSIUM CHLORIDE CRYS ER 20 MEQ PO TBCR
40.0000 meq | EXTENDED_RELEASE_TABLET | Freq: Once | ORAL | Status: AC
  Administered 2023-06-13: 40 meq via ORAL
  Filled 2023-06-13: qty 2

## 2023-06-13 MED ORDER — GUAIFENESIN 100 MG/5ML PO LIQD
5.0000 mL | ORAL | Status: DC | PRN
  Administered 2023-06-13: 5 mL via ORAL
  Filled 2023-06-13: qty 15

## 2023-06-13 MED ORDER — METHYLPREDNISOLONE SODIUM SUCC 40 MG IJ SOLR
40.0000 mg | Freq: Once | INTRAMUSCULAR | Status: AC
  Administered 2023-06-13: 40 mg via INTRAVENOUS
  Filled 2023-06-13: qty 1

## 2023-06-13 NOTE — Progress Notes (Signed)
Physical Therapy Treatment Patient Details Name: Allison Caldwell MRN: [id number] DOB: June 04, 1946 Today's Date: 06/13/2023   History of Present Illness Pt is a 77 y.o. female admitted from facility on 06/10/23 with LLE swelling, cough, hypoxia. Workup for COPD exacerbation. Negative DVT LLE. 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 Other PMH includes DM2, HTN, cardiomyopathy, morbid obesity, anxiety.    PT Comments  Pt greeted resting in bed and agreeable to session with encouragement. Pt performing bed mobility with heavy mod A to elevate trunk to upright sitting, pt demonstrating improved initiation of BLEs off<>on EOB this session with light cues for sequencing. Pt declining transfer attempts this session despite encouragement, however pt able to maintain midline unsupported sitting at EOB for >10 mins before fatigue. Pt continues to be limited by decreased activity tolerance, impaired cognitions, L weakness and inattention and cognitive deficits. Current plan remains appropriate to address deficits and maximize functional independence and decrease caregiver burden. Pt continues to benefit from skilled PT services to progress toward functional mobility goals.      If plan is discharge home, recommend the following: Two people to help with walking and/or transfers;A lot of help with bathing/dressing/bathroom;Assistance with feeding   Can travel by private vehicle     No  Equipment Recommendations  Other (comment) (defer to next venue)    Recommendations for Other Services       Precautions / Restrictions Precautions Precautions: Fall;Other (comment) Precaution Comments: h/o CVA with L-side weakness, L inattention Restrictions Weight Bearing Restrictions: No     Mobility  Bed Mobility Overal bed mobility: Needs Assistance Bed Mobility: Sit to Supine, Supine to Sit   Sidelying to sit: Mod assist    Sit to supine: Mod assist   General bed mobility comments: pt able to initiate LEs to and off EOB, mod A to elevate trunk, pt neeing mod A to scoot all the way out to EOB    Transfers                   General transfer comment: not attempted as pt with c/o dizziness in sitting and stating need to return to supine    Ambulation/Gait                   Stairs             Wheelchair Mobility     Tilt Bed    Modified Rankin (Stroke Patients Only)       Balance Overall balance assessment: Needs assistance Sitting-balance support: No upper extremity supported, Feet supported Sitting balance-Leahy Scale: Fair Sitting balance - Comments: assist to position hips sitting EOB, able to maintain static sitting balance with supervision while attending to breakfast                                    Cognition Arousal: Alert Behavior During Therapy: Flat affect Overall Cognitive Status: No family/caregiver present to determine baseline cognitive functioning                                 General Comments: Confusion noted. Tangental responses. Difficult to follow her line of conversation. flat affect. internally distracted. halluncintaing and perseverating on rat in bed during session        Exercises  General Comments        Pertinent Vitals/Pain Pain Assessment Pain Assessment: No/denies pain Pain Intervention(s): Monitored during session    Home Living                          Prior Function            PT Goals (current goals can now be found in the care plan section) Acute Rehab PT Goals Patient Stated Goal: none stated PT Goal Formulation: With patient Time For Goal Achievement: 06/25/23 Progress towards PT goals: Progressing toward goals    Frequency    Min 1X/week      PT Plan      Co-evaluation              AM-PAC PT "6 Clicks" Mobility   Outcome Measure  Help needed  turning from your back to your side while in a flat bed without using bedrails?: A Lot Help needed moving from lying on your back to sitting on the side of a flat bed without using bedrails?: A Lot Help needed moving to and from a bed to a chair (including a wheelchair)?: Total Help needed standing up from a chair using your arms (e.g., wheelchair or bedside chair)?: Total Help needed to walk in hospital room?: Total Help needed climbing 3-5 steps with a railing? : Total 6 Click Score: 8    End of Session   Activity Tolerance: Patient tolerated treatment well;Other (comment) (limited by AMS and dizziness) Patient left: in bed;with call bell/phone within reach;with bed alarm set Nurse Communication: Mobility status PT Visit Diagnosis: Other abnormalities of gait and mobility (R26.89);Other symptoms and signs involving the nervous system (R29.898)     Time: 2956-2130 PT Time Calculation (min) (ACUTE ONLY): 20 min  Charges:    $Therapeutic Activity: 8-22 mins PT General Charges $$ ACUTE PT VISIT: 1 Visit                      R. PTA Acute Rehabilitation Services Office: [phone]   Catalina Antigua 06/13/2023, 9:32 AM

## 2023-06-13 NOTE — Progress Notes (Signed)
O2 weaned off to 1 L. SPO2 94%. Made the provider aware

## 2023-06-13 NOTE — Plan of Care (Signed)
  Problem: Education: Goal: Knowledge of General Education information will improve Description: Including pain rating scale, medication(s)/side effects and non-pharmacologic comfort measures Outcome: Progressing   Problem: Health Behavior/Discharge Planning: Goal: Ability to manage health-related needs will improve Outcome: Progressing   Problem: Clinical Measurements: Goal: Ability to maintain clinical measurements within normal limits will improve Outcome: Progressing Goal: Will remain free from infection Outcome: Progressing Goal: Diagnostic test results will improve Outcome: Progressing Goal: Respiratory complications will improve Outcome: Progressing Goal: Cardiovascular complication will be avoided Outcome: Progressing   Problem: Activity: Goal: Risk for activity intolerance will decrease Outcome: Progressing   Problem: Nutrition: Goal: Adequate nutrition will be maintained Outcome: Progressing   Problem: Coping: Goal: Level of anxiety will decrease Outcome: Progressing   Problem: Safety: Goal: Ability to remain free from injury will improve Outcome: Progressing   

## 2023-06-13 NOTE — Care Management Important Message (Signed)
Important Message  Patient Details  Name: Allison Caldwell MRN: [id number] Date of Birth: 01/12/1946   Medicare Important Message Given:  Yes     Dorena Bodo 06/13/2023, 2:08 PM

## 2023-06-13 NOTE — Progress Notes (Signed)
Speech Language Pathology Treatment: Dysphagia  Patient Details Name: Allison Caldwell MRN: [id number] DOB: 08/08/1946 Today's Date: 06/13/2023 Time: 1005-1020 SLP Time Calculation (min) (ACUTE ONLY): 15 min  Assessment / Plan / Recommendation Clinical Impression  Patient seen by SLP for skilled treatment focused on dysphagia goals. She was awake, alert, c/o cough and sore throat, wanting some cough drops. SLP inspected her meal tray and it appeared she had eaten 25% or a little more and food had been cut up. When asked about this, patient said, "I can't eat hard foods" and that she had previously had foods "ground up". (Which is accurate based on chart review) She drank consecutive sips of water without immediate cough or throat clear. Difficult to differentiate as patient with chronic congested cough. SLP recommending downgrade patient's diet to dys 3 (mechanical soft) and thin liquids and will plan to f/u one more time to ensure toleration.    HPI HPI: Pt is a 77 y.o. female who presented with left lower extremity swelling suspected to be DVT and SOB. CT chest negative for acute cardiopulmonary disease. SLP consulted 8/11 secondary OT report of "pt observed to cough and struggle with bites of peaches and sips of coffee." Pt dx with acute metabolic encephalopathy. PMH: diabetes, hypertension, morbid obesity, TIA, anxiety disorder, degenerative joint disease, cardiomyopathy, paroxysmal atrial fibrillation on chronic anticoagulation with Eliquis. 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 MBS 04/04/23:  mild oropharyngeal dysphagia with oral  phase marked by prolonged mastication, bolus formation and transit with solid, impaired timing resulting in penetration before full laryngeal closure achieved however material exited vestibule during the swallow spontaneously (PAS 2). A dysphagia 1 diet with thin liquids was recommended at that time and this was advanced to  dysphagia 2 and thin liquids on 04/05/23.      SLP Plan  Continue with current plan of care      Recommendations for follow up therapy are one component of a multi-disciplinary discharge planning process, led by the attending physician.  Recommendations may be updated based on patient status, additional functional criteria and insurance authorization.    Recommendations  Diet recommendations: Dysphagia 3 (mechanical soft);Thin liquid Medication Administration: Whole meds with puree Compensations: Slow rate;Small sips/bites;Minimize environmental distractions Postural Changes and/or Swallow Maneuvers: Seated upright 90 degrees                  Oral care BID   Set up Supervision/Assistance Dysphagia, unspecified (R13.10)     Continue with current plan of care     Angela Nevin, MA, CCC-SLP Speech Therapy

## 2023-06-13 NOTE — Progress Notes (Signed)
Patient quite confused, removing her gown, oxygen, she calling her son as if he is in the same room. However, she was able to tell me she is in pain on the right leg/side.

## 2023-06-13 NOTE — Progress Notes (Addendum)
Subjective: Allison Caldwell is a 77 yo F with PMH significant for diabetes, HTN, obesity, previous TIA, proxysmal afib on eliquis, cardiomyopathy, DJD, and anxiety. She presented for LLE swelling/concern for DVT. Admitted for acute hypoxic respiratory failure.   No acute events overnight.   Pt seen at bedside this morning. She was tearful due to not having ice water and needed her breakfast warmed up. Pt reports still coughing with sputum production but improved from yesterday. Reports feeling SOB from coughing. Denies chest pain and palpitations.   Objective: Vital signs in last 24 hours: Vitals:   06/12/23 2058 06/13/23 0020 06/13/23 0423 06/13/23 0740  BP: 113/79 (!) 141/92 (!) 139/107 132/87  Pulse: 68  (!) 53 (!) 59  Resp: 17 16 16 15   Temp:  98.7 F (37.1 C)  (!) 97.5 F (36.4 C)  TempSrc:    Oral  SpO2: 99% 96% 100% 100%  Weight:      Height:       Supplemental O2: Nasal Cannula  SpO2: 100%  Flow Rate: 2L/min  Weight change:   Intake/Output Summary (Last 24 hours) at 06/13/2023 0750 Last data filed at 06/12/2023 2200 Gross per 24 hour  Intake 358 ml  Output --  Net 358 ml   Physical Exam:  General: NAD, alert, cooperative.  HEENT: NCAT, PERRLA, EOMI, vision grossly intact, moist mucous membrane. Neck: Supple, trachea midline.  Lungs: Diffuse wheezing on 2L [place]. Coughing frequently.  Heart: RRR, no M/R/G. Radial pulses 2+  Abdomen: +BS. Soft, NTND. Extremities: MAE. No cyanosis or clubbing. No LE edema. Neuro: Alert and oriented. No focal deficit.  Skin: warm, dry, good turgor.  Psych: cooperative, appropriate mood and affect.    Assessment/Plan:  Principal Problem:   Acute hypoxemic respiratory failure (HCC) Active Problems:   Diabetes mellitus type 2 with complications (HCC)   Hyperlipidemia associated with type 2 diabetes mellitus (HCC)   Morbid obesity with BMI of 45.0-49.9, adult (HCC)   ANXIETY DEPRESSION   Hypertension associated with diabetes (HCC)    Hyponatremia   Paroxysmal atrial fibrillation (HCC)  Plan: # Acute hypoxic respiratory failure likely secondary to COPD exacerbation: -Patient with persistent wheezing and cough today despite completing 3 days of prednisone and doxycycline.  Will give 1 dose of Solu-Medrol 40 mg in addition to oral prednisone today.  -Patient is currently on 2L O2 via nasal cannula but O2 sats are in the 90s to 100.  Goal O2 sat is 88 to 90%.  Will continue to attempt to wean off her O2 as tolerated. ABG yesterday with mild metabolic alkalosis 7.47/32/44. - Continue duonebs q4h, prednisone, and doxycycline day 3/5. - Continue Breo Ellipta and Incruse Ellipta - Evaluate if able to walk today and maintain O2  - Started guaifenesin for cough   # Acute metabolic encephalopathy Somnolence likely due to Seroquel, held two days ago.  More awake today. ABG yesterday with mild metabolic alkalosis 7.47/32/44  # Hypokalemia K 3.4 this am. Repleted with potassium chloride this am.   #Type 2 diabetes  On Semglee 13u daily and SSI. CBGs in 100s.  # Hypertension  On amlodipine 10mg , losartan 100mg , and metoprolol succinate 100mg . BP stable.   # Paroxysmal Afib On metoprolol succinate 100mg , diltiazem 420mg , and eliquis 5mg  bid.   # HLD On Pravastatin 20mg   # Tension headache Acetaminophen PRN  Best Practice: Diet: diabetic diet IVF: none VTE: eliquis  Code: full AB: doxycycline Therapy Recs: SNF Family contact: Son, Madelaine Bhat, to be notified Dispo: anticipated  discharge in 1-3 days to SNF/[facility] pending O2 requirement/medical stability    LOS: 3 days   Lang Snow, Medical Student 06/13/2023, 7:50 AM  Pager: [phone]

## 2023-06-13 NOTE — Progress Notes (Signed)
Incentive spirometry introduced with teach back 

## 2023-06-14 ENCOUNTER — Inpatient Hospital Stay (HOSPITAL_COMMUNITY): Payer: Medicare HMO

## 2023-06-14 DIAGNOSIS — Z87891 Personal history of nicotine dependence: Secondary | ICD-10-CM | POA: Diagnosis not present

## 2023-06-14 DIAGNOSIS — J9601 Acute respiratory failure with hypoxia: Secondary | ICD-10-CM | POA: Diagnosis not present

## 2023-06-14 LAB — GLUCOSE, CAPILLARY
Glucose-Capillary: 178 mg/dL — ABNORMAL HIGH (ref 70–99)
Glucose-Capillary: 246 mg/dL — ABNORMAL HIGH (ref 70–99)
Glucose-Capillary: 248 mg/dL — ABNORMAL HIGH (ref 70–99)
Glucose-Capillary: 238 mg/dL — ABNORMAL HIGH (ref 70–99)

## 2023-06-14 LAB — BASIC METABOLIC PANEL
Anion gap: 14 (ref 5–15)
BUN: 16 mg/dL (ref 8–23)
CO2: 30 mmol/L (ref 22–32)
Calcium: 9.2 mg/dL (ref 8.9–10.3)
Chloride: 94 mmol/L — ABNORMAL LOW (ref 98–111)
Creatinine, Ser: 0.6 mg/dL (ref 0.44–1.00)
GFR, Estimated: >60 mL/min (ref >60)
Glucose, Bld: 194 mg/dL — ABNORMAL HIGH (ref 70–99)
Potassium: 3.4 mmol/L — ABNORMAL LOW (ref 3.5–5.1)
Sodium: 138 mmol/L (ref 135–145)

## 2023-06-14 LAB — MAGNESIUM: Magnesium: 1.4 mg/dL — ABNORMAL LOW (ref 1.7–2.4)

## 2023-06-14 MED ORDER — MAGNESIUM SULFATE 4 GM/100ML IV SOLN
4.0000 g | Freq: Once | INTRAVENOUS | Status: AC
  Administered 2023-06-14: 4 g via INTRAVENOUS
  Filled 2023-06-14: qty 100

## 2023-06-14 MED ORDER — POTASSIUM CHLORIDE CRYS ER 20 MEQ PO TBCR
40.0000 meq | EXTENDED_RELEASE_TABLET | Freq: Once | ORAL | Status: AC
  Administered 2023-06-14: 40 meq via ORAL
  Filled 2023-06-14: qty 2

## 2023-06-14 NOTE — Progress Notes (Addendum)
Subjective: Allison Caldwell is a 77 yo F with PMH significant for T2DM, HTN, obesity, previous TIA, pAfib on eliquis, cardiomyopathy, DJD, and anxiety. She presented for LLE swelling/concern for DVT. Admitted for acute hypoxic respiratory failure.   Overnight she had an episode of confusion and tried to remove her gown and supplemental O2. She seemed to be speaking to her son who was not in the room. She reported pain on her R leg/side.   Pt seen at bedside. Laying in bed coughing frequently. O2 sat was rechecked at bedside and pt satting 94% on room air. Pt reports not feeling more SOB but coughing a lot with white sputum production. She denied chest pain. She has right sided pain that she reports is chronic and not worse than baseline.   Objective:  Vital signs in last 24 hours: Vitals:   06/13/23 1939 06/13/23 1942 06/14/23 0520 06/14/23 0721  BP:  134/78 (!) 144/88 (!) 152/102  Pulse:  63 64 65  Resp:  16 16 18   Temp:  98.6 F (37 C) (!) 95.7 F (35.4 C) 97.6 F (36.4 C)  TempSrc:    Oral  SpO2: 96% 96% 98% 93%  Weight:      Height:       Weight change:   Intake/Output Summary (Last 24 hours) at 06/14/2023 9528 Last data filed at 06/14/2023 0719 Gross per 24 hour  Intake 100 ml  Output 1700 ml  Net -1600 ml   Physical Exam: General: NAD, cooperative. Alert and oriented to self, time, place, situation.  HEENT: NCAT. vision grossly intact, normal conjunctiva, sclera anicteric, moist mucous membrane. Neck: Supple, trachea midline.  Lungs: Wheezing in lower lung fields bilaterally, improved from yesterday. Coughing frequently.  Heart: RRR, no M/R/G.  Abdomen: +BS. Soft, NTND. Extremities: MAE. FROM. no LE edema, no cyanosis or clubbing.  Neuro: Alert and oriented. No focal deficit.   Skin: warm, dry, good turgor.  Psych: cooperative, appropriate mood and affect.     Assessment/Plan:  Principal Problem:   Acute hypoxemic respiratory failure (HCC) Active Problems:    Diabetes mellitus type 2 with complications (HCC)   Hyperlipidemia associated with type 2 diabetes mellitus (HCC)   Morbid obesity with BMI of 45.0-49.9, adult (HCC)   ANXIETY DEPRESSION   Hypertension associated with diabetes (HCC)   Hyponatremia   Paroxysmal atrial fibrillation (HCC)  Plan: #Acute hypoxic respiratory failure likely secondary to COPD exacerbation Pt with persistent wheezing and coughing despite completing 4 days of prednisone and doxycycline and two doses of IV Solu-Medrol.  - 1 dose IV Solu-Medrol yesterday. - Weaned O2 from 2L [place] to room air today. Continue off O2 as tolerated with O2 goal 88%-92%.  - Continue duonebs q4h, prednisone, and doxycycline day 4/5. Continue incentive spirometer.   - Continue Breo Ellipta and Incruse Ellipta  - Continue guaifenesin for cough  - CXR with pulmonary vascular congestion and cardiomegaly. - Pt declined transfer out of bed and walk today. Continue PT  #Pulmonary HTN CXR showed enlarged pulmonary artery, pulmonary vascular congestion, and cardiomegaly. TTE in May showed normal right ventricle systolic function and size.  - Continue treating COPD   #Acute metabolic encephalopathy  Somnolence likely due to Seroquel. Pt alert and oriented x4 today.   - Continue to hold Seroquel.   #Hypokalemia #Hypomagnesemia  Potassium low at 3.4 again today after repletion yesterday. Mg also low at 1.4.  - Replete with IV Mg and PO K  #T2DM On Semglee 13u daily and SSI.  CBGs in high 200s, pt receiving steroids.   #HTN BP stable on amlodipine 10mg , losartan 100mg , and metoprolol succinate 100mg .   #Paroxysmal Afib  HR stable on metoprolol succinate 100mg , diltiazem 420mg , and eliquis 5mg  bid.   #HLD On Pravastatin 20mg    #Anxiety Sertraline 100mg   #Tension Headache  Acetaminophen PRN  Best Practice: Diet: diabetic diet IVF: none VTE: eliquis  Code: full  AB: doxycycline Therapy Recs: SNF Family contact: Son, Madelaine Bhat, to be  notified Dispo: anticipated discharge in 1-3 days to SNF/[facility] pending O2 requirement/medical stability    LOS: 4 days   Lang Snow, Medical Student 06/14/2023, 7:22 AM

## 2023-06-14 NOTE — Progress Notes (Signed)
Occupational Therapy Treatment Patient Details Name: Allison Caldwell MRN: [id number] DOB: 09/29/1946 Today's Date: 06/14/2023   History of present illness Pt is a 77 y.o. female admitted from facility on 06/10/23 with LLE swelling, cough, hypoxia. Workup for COPD exacerbation. Negative DVT LLE. 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 Other PMH includes DM2, HTN, cardiomyopathy, morbid obesity, anxiety.   OT comments  Pt actively participated in skilled therapy today, motivated to improve function and perform OOB activities. Arrived with Pt alone in room, having conversations with relatives who were not present, confused. Pt aware of person, place, knows she is being treated for recent CI, not aware of current reason for admission, or todays date. Pt assisted to EOB, mod A, attempted to stand with RW multiple attempts, not able to fully lift off bed with max A. Pt able to sit on EOB performing ADLs, good RUE function, displays L side neglect, not able to functionally utilize LUE at this time. Pt would benefit from continued skilled therapy to improve with ability to complete transfers, reduce L side neglect, and improve independence with ADLs. DC plan still appropriate.       If plan is discharge home, recommend the following:  Two people to help with walking and/or transfers;Two people to help with bathing/dressing/bathroom;Assistance with feeding;Direct supervision/assist for medications management;Direct supervision/assist for financial management;Assist for transportation;Supervision due to cognitive status   Equipment Recommendations  Other (comment) (defer)    Recommendations for Other Services      Precautions / Restrictions Precautions Precautions: Fall;Other (comment) Precaution Comments: h/o CVA with L-side weakness, L inattention Restrictions Weight Bearing Restrictions: No       Mobility Bed  Mobility Overal bed mobility: Needs Assistance Bed Mobility: Sit to Supine, Supine to Sit     Supine to sit: Mod assist Sit to supine: Mod assist   General bed mobility comments: mod assist in/out of bed, verbal cueing for safety    Transfers Overall transfer level: Needs assistance Equipment used: Rolling walker (2 wheels) Transfers: Sit to/from Stand Sit to Stand: Max assist, +2 physical assistance, +2 safety/equipment, From elevated surface           General transfer comment: Attempted STS at bedside, Pt able to put minimal weight on BLEs, not enough to lift off from bed fully with max assist and RW. L side neglect, not able to use LUE to assist     Balance Overall balance assessment: Needs assistance Sitting-balance support: No upper extremity supported, Feet supported Sitting balance-Leahy Scale: Fair Sitting balance - Comments: assist to EOB, able to maintain posture at EOB with supervision                                   ADL either performed or assessed with clinical judgement   ADL Overall ADL's : Needs assistance/impaired                     Lower Body Dressing: Total assistance;Cueing for safety                      Extremity/Trunk Assessment              Vision       Perception     Praxis      Cognition Arousal: Alert Behavior During Therapy: Impulsive, Flat affect, Anxious  Overall Cognitive Status: No family/caregiver present to determine baseline cognitive functioning                                 General Comments: Pt confused, was alone in room but talking to relatives prior to entry. Pt able to state she was at [place], name/age, and able to state she was admitted this May for recent MCA infarct, but not oriented to todays date or most recent reason for admission.        Exercises      Shoulder Instructions       General Comments      Pertinent Vitals/ Pain       Pain  Assessment Pain Assessment: No/denies pain  Home Living                                          Prior Functioning/Environment              Frequency  Min 2X/week        Progress Toward Goals  OT Goals(current goals can now be found in the care plan section)  Progress towards OT goals: Progressing toward goals  Acute Rehab OT Goals Patient Stated Goal: not able to participate in goal setting OT Goal Formulation: With patient Time For Goal Achievement: 06/25/23 Potential to Achieve Goals: Fair ADL Goals Pt Will Perform Eating: with modified independence;bed level;with supervision Pt Will Perform Grooming: with supervision;with min assist;bed level Pt Will Perform Upper Body Bathing: with mod assist;bed level;sitting Additional ADL Goal #1: Pt will complete bed mobiltiy at min A +2 to prepare for EOB/OOB ADLs. Additional ADL Goal #2: Pt will tolerate sitting EOB for ADL task for 10 minutes at min A level.  Plan      Co-evaluation                 AM-PAC OT "6 Clicks" Daily Activity     Outcome Measure   Help from another person eating meals?: A Little Help from another person taking care of personal grooming?: A Lot Help from another person toileting, which includes using toliet, bedpan, or urinal?: Total Help from another person bathing (including washing, rinsing, drying)?: Total Help from another person to put on and taking off regular upper body clothing?: A Lot Help from another person to put on and taking off regular lower body clothing?: Total 6 Click Score: 10    End of Session Equipment Utilized During Treatment: Gait belt;Rolling walker (2 wheels)  OT Visit Diagnosis: Unsteadiness on feet (R26.81);Other abnormalities of gait and mobility (R26.89);Muscle weakness (generalized) (M62.81);Ataxia, unspecified (R27.0);Other symptoms and signs involving the nervous system (R29.898);Other symptoms and signs involving cognitive  function;Cognitive communication deficit (R41.841);Hemiplegia and hemiparesis Symptoms and signs involving cognitive functions: Cerebral infarction Hemiplegia - Right/Left: Left Hemiplegia - dominant/non-dominant: Non-Dominant   Activity Tolerance Patient tolerated treatment well   Patient Left in bed;with call bell/phone within reach;with bed alarm set   Nurse Communication Mobility status        Time: 0981-1914 OT Time Calculation (min): 24 min  Charges: OT General Charges $OT Visit: 1 Visit OT Treatments $Self Care/Home Management : 8-22 mins $Therapeutic Activity: 8-22 mins   , OTR/L   Alexis Goodell 06/14/2023, 4:36 PM

## 2023-06-15 DIAGNOSIS — Z87891 Personal history of nicotine dependence: Secondary | ICD-10-CM | POA: Diagnosis not present

## 2023-06-15 DIAGNOSIS — J9601 Acute respiratory failure with hypoxia: Secondary | ICD-10-CM | POA: Diagnosis not present

## 2023-06-15 DIAGNOSIS — I272 Pulmonary hypertension, unspecified: Secondary | ICD-10-CM | POA: Diagnosis not present

## 2023-06-15 LAB — BASIC METABOLIC PANEL
Anion gap: 10 (ref 5–15)
BUN: 20 mg/dL (ref 8–23)
CO2: 30 mmol/L (ref 22–32)
Calcium: 8.5 mg/dL — ABNORMAL LOW (ref 8.9–10.3)
Chloride: 94 mmol/L — ABNORMAL LOW (ref 98–111)
Creatinine, Ser: 0.55 mg/dL (ref 0.44–1.00)
GFR, Estimated: >60 mL/min (ref >60)
Glucose, Bld: 172 mg/dL — ABNORMAL HIGH (ref 70–99)
Potassium: 3.5 mmol/L (ref 3.5–5.1)
Sodium: 134 mmol/L — ABNORMAL LOW (ref 135–145)

## 2023-06-15 LAB — GLUCOSE, CAPILLARY
Glucose-Capillary: 148 mg/dL — ABNORMAL HIGH (ref 70–99)
Glucose-Capillary: 158 mg/dL — ABNORMAL HIGH (ref 70–99)
Glucose-Capillary: 169 mg/dL — ABNORMAL HIGH (ref 70–99)
Glucose-Capillary: 174 mg/dL — ABNORMAL HIGH (ref 70–99)
Glucose-Capillary: 191 mg/dL — ABNORMAL HIGH (ref 70–99)
Glucose-Capillary: 276 mg/dL — ABNORMAL HIGH (ref 70–99)
Glucose-Capillary: 346 mg/dL — ABNORMAL HIGH (ref 70–99)

## 2023-06-15 LAB — MAGNESIUM: Magnesium: 2 mg/dL (ref 1.7–2.4)

## 2023-06-15 MED ORDER — IPRATROPIUM-ALBUTEROL 0.5-2.5 (3) MG/3ML IN SOLN
3.0000 mL | Freq: Two times a day (BID) | RESPIRATORY_TRACT | Status: DC
  Administered 2023-06-15 – 2023-06-16 (×2): 3 mL via RESPIRATORY_TRACT
  Filled 2023-06-15 (×2): qty 3

## 2023-06-15 MED ORDER — ENSURE ENLIVE PO LIQD
237.0000 mL | Freq: Two times a day (BID) | ORAL | Status: DC
  Administered 2023-06-15 – 2023-06-16 (×2): 237 mL via ORAL

## 2023-06-15 MED ORDER — PREDNISONE 20 MG PO TABS
40.0000 mg | ORAL_TABLET | Freq: Every day | ORAL | Status: AC
  Administered 2023-06-16: 40 mg via ORAL
  Filled 2023-06-15: qty 2

## 2023-06-15 MED ORDER — GUAIFENESIN 100 MG/5ML PO LIQD
5.0000 mL | Freq: Two times a day (BID) | ORAL | Status: DC
  Administered 2023-06-15 – 2023-06-16 (×3): 5 mL via ORAL
  Filled 2023-06-15 (×3): qty 15

## 2023-06-15 MED ORDER — PANTOPRAZOLE SODIUM 40 MG PO TBEC
40.0000 mg | DELAYED_RELEASE_TABLET | Freq: Every day | ORAL | Status: DC
  Administered 2023-06-15 – 2023-06-16 (×2): 40 mg via ORAL
  Filled 2023-06-15 (×2): qty 1

## 2023-06-15 NOTE — Progress Notes (Signed)
Subjective: [name] is a 77 yo F with PMH significant for T2DM, HTN, obesity, previous TIA, pAfib on eliquis, cardiomyopathy, DJD, and anxiety. She presented for LLE swelling/concern for DVT. Admitted for acute hypoxic respiratory failure.   No acute events overnight.   Pt seen at bedside finishing up her breakfast. Pt reports continued cough, about the same as yesterday. She has been having difficulty getting PRN guaifenesin. Denied shortness of breath or chest pain. She was complaining of left LE swelling, pain at left ankle, and left sided loss of sensation. She is alert and oriented to self, year, place, but unable to do simple math. Called pt's son and apparently this is her baseline for mental status and mobility since her stroke two months ago. Her son reports that she frequently seems confused and talks about things that do not make sense. She has been in a wheelchair since her stroke. Prior to her stroke she was walking on her own. She has been experiencing left sided loss of sensation since her stroke.   Objective:  Vital signs in last 24 hours: Vitals:   06/15/23 0746 06/15/23 0849 06/15/23 0850 06/15/23 0851  BP: 128/89     Pulse: (!) 58     Resp: 17     Temp:      TempSrc:      SpO2: 95% 94% 94% 94%  Weight:      Height:       Weight change:   Intake/Output Summary (Last 24 hours) at 06/15/2023 0855 Last data filed at 06/15/2023 0550 Gross per 24 hour  Intake 720 ml  Output 1850 ml  Net -1130 ml   Physical Exam: General: NAD, cooperative. Alert and oriented to self, year, place.  HEENT: NCAT. vision grossly intact, sclera anicteric, moist mucous membrane. Neck: Supple, trachea midline. Lungs: normal respiratory effort on room air. Wheezing in bilateral mid and lower lobes, worsened from yesterday. Coughing with deep breaths.  Heart: RRR, no M/R/G. Radial pulse 2+  Abdomen: +BS. Soft, NTND. Extremities: Mild, nonpitting LLE edema, improved from before. No RLE  edema. No cyanosis or clubbing.  Neuro: Alert and oriented. No focal deficit. Left sided weakness.  Skin: warm, dry, good turgor.  Psych: cooperative, appropriate mood and affect.     Assessment/Plan:  Principal Problem:   Acute hypoxemic respiratory failure (HCC) Active Problems:   Diabetes mellitus type 2 with complications (HCC)   Hyperlipidemia associated with type 2 diabetes mellitus (HCC)   Morbid obesity with BMI of 45.0-49.9, adult (HCC)   ANXIETY DEPRESSION   Hypertension associated with diabetes (HCC)   Hyponatremia   Paroxysmal atrial fibrillation (HCC)  Plan: #Acute hypoxic respiratory failure likely due to COPD exacerbation Finished 5day course of doxycycline and prednisone  - Continue duonebs tid and two more days of prednisone 40mg  for continued wheezing  - Continue off O2 as tolerated and use incentive spirometer  - Continue Breo Ellipta and Incruse Ellipta and PT/OT rehab  - Guaifenesin changed from PRN to scheduled for cough   #Pulmonary HTN - Continue treating COPD   #Acute metabolic encephalopathy Seems to be back to baseline, per conversation with her son.   - Continue to hold Seroquel   #Hyponatremia  - Na 134 today. Continue to monitor.   #Hypokalemia #Hypomagnesemia - Resolved after repletion yesterday. Continue to monitor and replete as needed.   #T2DM - Semglee 13u and SSI. CBGs in 100s.   #HTN - BP stable on amlodipine 10mg , losartan 100mg , and metoprolol  succinate 100mg   #Paraoxysmal Afib - HR stable on metoprolol succinate 100mg , diltiazem 420mg , and eliquis 5mg  bid  #HLD - Pravastatin 20mg   #Anxiety  - Sertraline 100mg   #Tension Headache - Acetaminophen PRN   Best Practice: Diet: diabetic diet IVF: none VTE: eliquis  Code: full AB: doxycycline Therapy Recs: SNF/[facility] Family contact: Son, Deondre, notified today  Dispo: anticipated discharge in 1-3 days to SNF/[facility] pending O2 requiremrnt/medical stability      LOS: 5 days   Lang Snow, Medical Student 06/15/2023, 8:55 AM

## 2023-06-15 NOTE — Progress Notes (Signed)
Nutrition Follow-up  DOCUMENTATION CODES:   Morbid obesity  INTERVENTION:  -  Modify to Ensure Enlive po BID, each supplement provides 350 kcal and 20 grams of protein.  - Continue MVI q day.   NUTRITION DIAGNOSIS:   Inadequate oral intake related to poor appetite as evidenced by meal completion < 25%.  GOAL:   Patient will meet greater than or equal to 90% of their needs - Not met, continue as goal.   MONITOR:   PO intake, Supplement acceptance  REASON FOR ASSESSMENT:   Consult Assessment of nutrition requirement/status  ASSESSMENT:   77 y.o. female admits related to SOB. PMH includes: cardiomyopathy, DM, TIA. Pt is currently receiving medical management related to acute hypoxemic respiratory failure.  Meds reviewed: sliding scale insulin, cozaar, semglee, MVI, prednisone. Labs reviewed: Na low.   Pt was sleeping at time of assessment. RN reports that the pt ate 100% of her breakfast this am. Intakes have varied over the past 7 days, with pt mostly eating 25-50% of her meals. Pt currently has Ensure Max BID ordered. RD will modify to Ensure Enlive to provide more calories/protein in setting of poor PO intakes. RD will continue to monitor PO intakes.   NUTRITION - FOCUSED PHYSICAL EXAM:  WDL - no wasting noted.   Diet Order:   Diet Order             DIET DYS 3 Room service appropriate? No; Fluid consistency: Thin  Diet effective now                   EDUCATION NEEDS:   Not appropriate for education at this time  Skin:  Skin Assessment: Reviewed RN Assessment  Last BM:  8/15 - type 6  Height:   Ht Readings from Last 1 Encounters:  06/10/23 5\' 2"  (1.575 m)    Weight:   Wt Readings from Last 1 Encounters:  06/12/23 117.7 kg    Ideal Body Weight:     BMI:  Body mass index is 47.46 kg/m.  Estimated Nutritional Needs:   Kcal:  2300-2500 kcals  Protein:  115-125 gm  Fluid:  >/= 2.3 L  Bethann Humble, RD, LDN, CNSC.

## 2023-06-15 NOTE — Plan of Care (Signed)
  Problem: Clinical Measurements: Goal: Ability to maintain clinical measurements within normal limits will improve Outcome: Progressing Goal: Will remain free from infection Outcome: Progressing Goal: Diagnostic test results will improve Outcome: Progressing Goal: Respiratory complications will improve Outcome: Progressing Goal: Cardiovascular complication will be avoided Outcome: Progressing   Problem: Nutrition: Goal: Adequate nutrition will be maintained Outcome: Progressing   Problem: Coping: Goal: Level of anxiety will decrease Outcome: Progressing   Problem: Elimination: Goal: Will not experience complications related to urinary retention Outcome: Progressing   Problem: Pain Managment: Goal: General experience of comfort will improve Outcome: Progressing   Problem: Safety: Goal: Ability to remain free from injury will improve Outcome: Progressing   Problem: Skin Integrity: Goal: Risk for impaired skin integrity will decrease Outcome: Progressing   Problem: Education: Goal: Ability to describe self-care measures that may prevent or decrease complications (Diabetes Survival Skills Education) will improve Outcome: Progressing   Problem: Coping: Goal: Ability to adjust to condition or change in health will improve Outcome: Progressing   Problem: Fluid Volume: Goal: Ability to maintain a balanced intake and output will improve Outcome: Progressing   Problem: Metabolic: Goal: Ability to maintain appropriate glucose levels will improve Outcome: Progressing   Problem: Nutritional: Goal: Maintenance of adequate nutrition will improve Outcome: Progressing   Problem: Skin Integrity: Goal: Risk for impaired skin integrity will decrease Outcome: Progressing   Problem: Tissue Perfusion: Goal: Adequacy of tissue perfusion will improve Outcome: Progressing   Problem: Education: Goal: Knowledge of General Education information will improve Description:  Including pain rating scale, medication(s)/side effects and non-pharmacologic comfort measures Outcome: Not Progressing   Problem: Health Behavior/Discharge Planning: Goal: Ability to manage health-related needs will improve Outcome: Not Progressing   Problem: Elimination: Goal: Will not experience complications related to bowel motility Outcome: Not Progressing   Problem: Health Behavior/Discharge Planning: Goal: Ability to identify and utilize available resources and services will improve Outcome: Not Progressing Goal: Ability to manage health-related needs will improve Outcome: Not Progressing   Problem: Nutritional: Goal: Progress toward achieving an optimal weight will improve Outcome: Not Progressing   Problem: Education: Goal: Individualized Educational Video(s) Outcome: Not Applicable

## 2023-06-15 NOTE — Plan of Care (Signed)
  Problem: Education: Goal: Knowledge of General Education information will improve Description: Including pain rating scale, medication(s)/side effects and non-pharmacologic comfort measures Outcome: Progressing   Problem: Health Behavior/Discharge Planning: Goal: Ability to manage health-related needs will improve Outcome: Progressing   Problem: Clinical Measurements: Goal: Ability to maintain clinical measurements within normal limits will improve Outcome: Progressing Goal: Will remain free from infection Outcome: Progressing Goal: Diagnostic test results will improve Outcome: Progressing Goal: Respiratory complications will improve Outcome: Progressing Goal: Cardiovascular complication will be avoided Outcome: Progressing   Problem: Activity: Goal: Risk for activity intolerance will decrease Outcome: Progressing   Problem: Nutrition: Goal: Adequate nutrition will be maintained Outcome: Progressing   Problem: Coping: Goal: Level of anxiety will decrease Outcome: Progressing   Problem: Elimination: Goal: Will not experience complications related to bowel motility Outcome: Progressing Goal: Will not experience complications related to urinary retention Outcome: Progressing   Problem: Pain Managment: Goal: General experience of comfort will improve Outcome: Progressing   Problem: Safety: Goal: Ability to remain free from injury will improve Outcome: Progressing   Problem: Skin Integrity: Goal: Risk for impaired skin integrity will decrease Outcome: Progressing   Problem: Education: Goal: Ability to describe self-care measures that may prevent or decrease complications (Diabetes Survival Skills Education) will improve Outcome: Progressing   Problem: Coping: Goal: Ability to adjust to condition or change in health will improve Outcome: Progressing   Problem: Fluid Volume: Goal: Ability to maintain a balanced intake and output will improve Outcome:  Progressing   Problem: Health Behavior/Discharge Planning: Goal: Ability to identify and utilize available resources and services will improve Outcome: Progressing Goal: Ability to manage health-related needs will improve Outcome: Progressing   Problem: Metabolic: Goal: Ability to maintain appropriate glucose levels will improve Outcome: Progressing   Problem: Nutritional: Goal: Maintenance of adequate nutrition will improve Outcome: Progressing Goal: Progress toward achieving an optimal weight will improve Outcome: Progressing   Problem: Skin Integrity: Goal: Risk for impaired skin integrity will decrease Outcome: Progressing   Problem: Tissue Perfusion: Goal: Adequacy of tissue perfusion will improve Outcome: Progressing   

## 2023-06-15 NOTE — Progress Notes (Signed)
Speech Language Pathology Treatment: Dysphagia  Patient Details Name: Allison Caldwell MRN: [id number] DOB: 1946-06-07 Today's Date: 06/15/2023 Time: 1035-1050 SLP Time Calculation (min) (ACUTE ONLY): 15 min  Assessment / Plan / Recommendation Clinical Impression  Patient seen by SLP for skilled treatment focused on dysphagia goals. Patient was awake, in bed and seemed to be talking to herself or talking to someone who was not in the room. When SLP entered room, patient requesting water and showing SLP the thick water that was on her tray table. She continues to c/o cough as she did during session on 8/13. SLP retrieved some plain, unthickened water (unsure why she had nectar thick water in room as diet order continues to be for Dys 3 (mechanical soft) solids, thin liquids). She required some mild encouragement to take some sips as she was distracted by the many different non-medical complaints and requests she was making. No immediate s/s aspiration with straw sips of water. Patient did have a delayed, dry sounding cough consistent with her ongoing coughing that occurs in absence of PO's. SLP to s/o at this time as patient is on least restrictive oral diet and appears to be tolerating well.      HPI HPI: Pt is a 77 y.o. female who presented with left lower extremity swelling suspected to be DVT and SOB. CT chest negative for acute cardiopulmonary disease. SLP consulted 8/11 secondary OT report of "pt observed to cough and struggle with bites of peaches and sips of coffee." Pt dx with acute metabolic encephalopathy. PMH: diabetes, hypertension, morbid obesity, TIA, anxiety disorder, degenerative joint disease, cardiomyopathy, paroxysmal atrial fibrillation on chronic anticoagulation with Eliquis. 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 MBS 04/04/23:  mild oropharyngeal dysphagia with oral  phase marked by prolonged mastication, bolus formation and transit with  solid, impaired timing resulting in penetration before full laryngeal closure achieved however material exited vestibule during the swallow spontaneously (PAS 2). A dysphagia 1 diet with thin liquids was recommended at that time and this was advanced to dysphagia 2 and thin liquids on 04/05/23.      SLP Plan  All goals met;Discharge SLP treatment due to (comment)      Recommendations for follow up therapy are one component of a multi-disciplinary discharge planning process, led by the attending physician.  Recommendations may be updated based on patient status, additional functional criteria and insurance authorization.    Recommendations  Diet recommendations: Dysphagia 3 (mechanical soft);Thin liquid Liquids provided via: Cup;Straw Medication Administration: Whole meds with puree Supervision: Patient able to self feed;Intermittent supervision to cue for compensatory strategies Compensations: Slow rate;Small sips/bites;Minimize environmental distractions Postural Changes and/or Swallow Maneuvers: Seated upright 90 degrees                  Oral care BID   Set up Supervision/Assistance Dysphagia, unspecified (R13.10)     All goals met;Discharge SLP treatment due to (comment)    Angela Nevin, MA, CCC-SLP Speech Therapy

## 2023-06-16 DIAGNOSIS — I272 Pulmonary hypertension, unspecified: Secondary | ICD-10-CM | POA: Diagnosis not present

## 2023-06-16 DIAGNOSIS — Z87891 Personal history of nicotine dependence: Secondary | ICD-10-CM | POA: Diagnosis not present

## 2023-06-16 DIAGNOSIS — J9601 Acute respiratory failure with hypoxia: Secondary | ICD-10-CM | POA: Diagnosis not present

## 2023-06-16 LAB — BASIC METABOLIC PANEL
Anion gap: 12 (ref 5–15)
BUN: 22 mg/dL (ref 8–23)
CO2: 31 mmol/L (ref 22–32)
Calcium: 8.5 mg/dL — ABNORMAL LOW (ref 8.9–10.3)
Chloride: 94 mmol/L — ABNORMAL LOW (ref 98–111)
Creatinine, Ser: 0.56 mg/dL (ref 0.44–1.00)
GFR, Estimated: >60 mL/min (ref >60)
Glucose, Bld: 149 mg/dL — ABNORMAL HIGH (ref 70–99)
Potassium: 3.5 mmol/L (ref 3.5–5.1)
Sodium: 137 mmol/L (ref 135–145)

## 2023-06-16 LAB — GLUCOSE, CAPILLARY
Glucose-Capillary: 151 mg/dL — ABNORMAL HIGH (ref 70–99)
Glucose-Capillary: 155 mg/dL — ABNORMAL HIGH (ref 70–99)
Glucose-Capillary: 158 mg/dL — ABNORMAL HIGH (ref 70–99)
Glucose-Capillary: 172 mg/dL — ABNORMAL HIGH (ref 70–99)
Glucose-Capillary: 289 mg/dL — ABNORMAL HIGH (ref 70–99)
Glucose-Capillary: 315 mg/dL — ABNORMAL HIGH (ref 70–99)

## 2023-06-16 MED ORDER — PANTOPRAZOLE SODIUM 40 MG PO TBEC: 40.0000 mg | DELAYED_RELEASE_TABLET | Freq: Every day | ORAL | Status: AC

## 2023-06-16 MED ORDER — GUAIFENESIN ER 600 MG PO TB12: 600.0000 mg | ORAL_TABLET | Freq: Two times a day (BID) | ORAL | Status: AC

## 2023-06-16 MED ORDER — IPRATROPIUM-ALBUTEROL 0.5-2.5 (3) MG/3ML IN SOLN: 3.0000 mL | Freq: Four times a day (QID) | RESPIRATORY_TRACT | Status: DC | PRN

## 2023-06-16 MED ORDER — ACETAMINOPHEN 325 MG PO TABS: 650.0000 mg | ORAL_TABLET | Freq: Four times a day (QID) | ORAL | Status: AC | PRN

## 2023-06-16 NOTE — Plan of Care (Signed)
  Problem: Clinical Measurements: Goal: Ability to maintain clinical measurements within normal limits will improve Outcome: Progressing Goal: Will remain free from infection Outcome: Progressing   Problem: Nutrition: Goal: Adequate nutrition will be maintained Outcome: Progressing   Problem: Elimination: Goal: Will not experience complications related to bowel motility Outcome: Progressing Goal: Will not experience complications related to urinary retention Outcome: Progressing   Problem: Pain Managment: Goal: General experience of comfort will improve Outcome: Progressing   Problem: Safety: Goal: Ability to remain free from injury will improve Outcome: Progressing   Problem: Skin Integrity: Goal: Risk for impaired skin integrity will decrease Outcome: Progressing   Problem: Metabolic: Goal: Ability to maintain appropriate glucose levels will improve Outcome: Progressing   Problem: Nutritional: Goal: Maintenance of adequate nutrition will improve Outcome: Progressing   Problem: Skin Integrity: Goal: Risk for impaired skin integrity will decrease Outcome: Progressing   Problem: Tissue Perfusion: Goal: Adequacy of tissue perfusion will improve Outcome: Progressing   Problem: Education: Goal: Knowledge of General Education information will improve Description: Including pain rating scale, medication(s)/side effects and non-pharmacologic comfort measures Outcome: Not Progressing   Problem: Health Behavior/Discharge Planning: Goal: Ability to manage health-related needs will improve Outcome: Not Progressing   Problem: Clinical Measurements: Goal: Diagnostic test results will improve Outcome: Not Progressing Goal: Respiratory complications will improve Outcome: Not Progressing Goal: Cardiovascular complication will be avoided Outcome: Not Progressing   Problem: Activity: Goal: Risk for activity intolerance will decrease Outcome: Not Progressing   Problem:  Coping: Goal: Level of anxiety will decrease Outcome: Not Progressing   Problem: Education: Goal: Ability to describe self-care measures that may prevent or decrease complications (Diabetes Survival Skills Education) will improve Outcome: Not Progressing   Problem: Coping: Goal: Ability to adjust to condition or change in health will improve Outcome: Not Progressing   Problem: Fluid Volume: Goal: Ability to maintain a balanced intake and output will improve Outcome: Not Progressing   Problem: Health Behavior/Discharge Planning: Goal: Ability to identify and utilize available resources and services will improve Outcome: Not Progressing Goal: Ability to manage health-related needs will improve Outcome: Not Progressing   Problem: Nutritional: Goal: Progress toward achieving an optimal weight will improve Outcome: Not Progressing

## 2023-06-16 NOTE — Discharge Summary (Signed)
Name: Allison Caldwell MRN: [id number] DOB: 10-Jan-1946 77 y.o. PCP: Allison Sheldon, MD  Date of Admission: 06/10/2023  3:58 PM Date of Discharge: 06/16/2023 11:50 AAAM Attending Physician: Dr. Heide Spark  Discharge Diagnosis: Principal Problem:   Acute hypoxemic respiratory failure (HCC) Active Problems:   Diabetes mellitus type 2 with complications (HCC)   Hyperlipidemia associated with type 2 diabetes mellitus (HCC)   Morbid obesity with BMI of 45.0-49.9, adult (HCC)   ANXIETY DEPRESSION   Hypertension associated with diabetes (HCC)   Paroxysmal atrial fibrillation (HCC)   Pulmonary Hypertension    Acute metabolic encephalopathy - back to baseline    Hyponatremia - resolved   Hypokalemia - resolved     Discharge Medications: Allergies as of 06/16/2023       Reactions   Ace Inhibitors Other (See Comments)    cough   Penicillins Other (See Comments)   Muscle spasms, Has patient had a PCN reaction causing immediate rash, facial/tongue/throat swelling, SOB or lightheadedness with hypotension: Yes Has patient had a PCN reaction causing severe rash involving mucus membranes or skin necrosis: No Has patient had a PCN reaction that required hospitalization No Has patient had a PCN reaction occurring within the last 10 years: Yes If all of the above answers are "NO", then may proceed with Cephalosporin use.        Medication List     STOP taking these medications    LORazepam 0.5 MG tablet Commonly known as: ATIVAN       TAKE these medications    Accu-Chek FastClix Lancets Misc Check blood sugar 1 time a day   Accu-Chek Softclix Lancets lancets Use as directed up to 4 times daily.   Accu-Chek Guide test strip Generic drug: glucose blood Check blood sugar 1 time per day   Accu-Chek Guide test strip Generic drug: glucose blood Use as directed up to four times daily   Accu-Chek Guide w/Device Kit 1 each by Does not apply route daily. Check blood sugar 1  time a day   Accu-Chek Guide w/Device Kit Use up to four times daily as directed.   acetaminophen 325 MG tablet Commonly known as: TYLENOL Take 2 tablets (650 mg total) by mouth every 6 (six) hours as needed for headache.   amLODipine 10 MG tablet Commonly known as: NORVASC Take 1 tablet (10 mg total) by mouth daily.   apixaban 5 MG Tabs tablet Commonly known as: ELIQUIS Take 1 tablet (5 mg total) by mouth 2 (two) times daily.   benzonatate 100 MG capsule Commonly known as: TESSALON Take 100 mg by mouth 3 (three) times daily as needed for cough.   feeding supplement Liqd Take 237 mLs by mouth 2 (two) times daily between meals.   guaiFENesin 600 MG 12 hr tablet Commonly known as: MUCINEX Take 1 tablet (600 mg total) by mouth 2 (two) times daily.   insulin glargine-yfgn 100 UNIT/ML injection Commonly known as: SEMGLEE Inject 0.1 mLs (10 Units total) into the skin daily. What changed:  how much to take when to take this   lacosamide 200 MG Tabs tablet Commonly known as: VIMPAT Take 1 tablet (200 mg total) by mouth 2 (two) times daily.   levETIRAcetam 750 MG tablet Commonly known as: KEPPRA Take 1,500 mg by mouth 2 (two) times daily.   losartan 100 MG tablet Commonly known as: COZAAR Take 1 tablet (100 mg total) by mouth daily.   metFORMIN 500 MG tablet Commonly known as: GLUCOPHAGE Take 1 tablet (500  mg total) by mouth 2 (two) times daily with a meal.   metoprolol succinate 100 MG 24 hr tablet Commonly known as: TOPROL-XL Take 1 tablet (100 mg total) by mouth daily. Take with or immediately following a meal.   multivitamin with minerals Tabs tablet Take 1 tablet by mouth daily.   pantoprazole 40 MG tablet Commonly known as: PROTONIX Take 1 tablet (40 mg total) by mouth daily. Start taking on: June 17, 2023   pravastatin 20 MG tablet Commonly known as: PRAVACHOL TAKE 1 TABLET AT BEDTIME What changed: when to take this   QUEtiapine 50 MG  tablet Commonly known as: SEROQUEL Take 1 tablet (50 mg total) by mouth daily. What changed: Another medication with the same name was removed. Continue taking this medication, and follow the directions you see here.   sertraline 100 MG tablet Commonly known as: ZOLOFT Take 1 tablet (100 mg total) by mouth daily.   TechLite Pen Needles 31G X 8 MM Misc Generic drug: Insulin Pen Needle Use 4 (four) times daily with insulin pens.   Tiadylt ER 420 MG 24 hr capsule Generic drug: diltiazem TAKE 1 CAPSULE EVERY DAY What changed: how much to take   Trelegy Ellipta 100-62.5-25 MCG/ACT Aepb Generic drug: Fluticasone-Umeclidin-Vilant Inhale 1 puff into the lungs daily.   valproic acid 250 MG/5ML solution Commonly known as: DEPAKENE Take 10 mLs (500 mg total) by mouth every 8 (eight) hours.        Disposition and follow-up:   Allison Caldwell was discharged from [facility] in Stable condition.  At the hospital follow up visit please address:  1.  Follow-up:  a. Acute hypoxic respiratory failure being treated for COPD exacerbation: Patient discharged on Trelegy Ellipta once a day. At the appointment, please check O2 sat and K since patient is on an inhaler and was hypokalemic this hospitalization.     b. Acute metabolic encephalopathy: Patient's Seroquel was held this hospitalization due to somnolence but can restart Seroquel 50mg  at bedtime after discharge. At baseline, patient is oriented to self, date, place, and situation, but can be confused at times and talks about unrelated topics. At appt, please check patients mental status and for any signs of somnolence since restarting Seroquel.    c. Hypertension: Her blood pressure has fluctuated this admission with hypotension of 71/55 and hypertensive urgency of 180/90, both events were asymptomatic and resolved without intervention. At appt, please evaluate antihypertensives and make adjustments as needed.    d.  T2DM: During hospitalization Metformin was held and Semglee increased from 10units to 13units. At discharge she can restart Metformin and go back to [facility] 10 units. Her A1C in May is 8.8%. CBGs this admission 100s-200s. At appt, please check A1C and check her fasting blood sugar.   2.  Labs / imaging needed at time of follow-up: fasting glucose and A1C  3.  Pending labs/ test needing follow-up: None  4.  Medication Changes  Guaifenesin 650mg  every 12 hours as needed for cough.    Follow-up Appointments:  Contact information for after-discharge care     Destination     [facility] Preferred SNF .   Service: Skilled Nursing Contact information: [address] [place] [address] [phone]                     Hospital Course by problem list: Allison Caldwell is a 77 yo female with HTN, DM, obesity, previous TIA, anxiety, DDD,  paroxysmal Afib on anticoagulation, and cardiomyopathy who presented with LLE swelling with suspicion for DVT, but admitted for acute hypoxic respiratory failure.   Acute hypoxic respiratory failure Presented with O2 sat in the 80s per EMS, COVID negative, CXR negative. Lower extremity ultrasound negative for DVT bilaterally, thus low suspicion that his patient has a PE (additionally, CTA was negative). She has no formal diagnosis of COPD, but is being treated for a COPD exacerbation. She was on 2L [place] on hospital day 1-2 but weaned off O2 to room air on hospital day 4 and satting well. She does not use supplemental O2 at home. The patient received two doses IV solumedrol in combination with prednisone 40 mg daily and doxycycline 100 mg bid x 5 days. She is also receiving duonebs, Breo Ellipta, and Incruse Ellipta. CXR ordered for continued cough and wheezing despite steroids and abx. CXR showed enlarged pulmonary artery, pulmonary vascular congestion, and stable cardiomegaly. Pt is getting guaifenesin 650mg  twice a day for  cough. Patient has been instructed on how to use incentive spirometer. Prednisone was continued for one extra day after completing 5 day course due to cough and wheezing. On the day of discharge pt reports cough has improved significantly and lungs were clear to auscultation without wheezing.    Pulmonary HTN Chest Xray showed enlarged pulmonary artery, pulmonary vascular congestion, and cardiomegaly. TTE in May showed normal right ventricle systolic normal and size. Treating COPD to alleviate pulmonary hypertension.   Acute metabolic encephalopathy The patient was noted to be more somonlent on hospital day 2. ABG with mild metabolic alkalosis at 7.46 / 44 / 59. CT head was negative for acute intracranial pathology but did show encephalomalacia in R frontal and parietal lobes, consistent with prior infarct. Somnolence could be secondary to seroquel, which was discontinued and patient became more alert and oriented. Pt alert and oriented to self, year, place, and situation; however, unable to do simple math. Called her son, Allison Caldwell, to ask about her baseline and this is her baseline mental status since her stroke 2 months ago. Her son reports that she seems confused often and will say things that do not make sense. She is stable and back to her baseline mental status.   Hypokalemia Hypomagnesemia  Potassium was low at 3.4 on hospital day 2 and repleted but remained low at 3.4 the next day. Mg was checked and was low at 1.4.  Patient was repleted with one dose PO K and IV Mg 4g and potassium and magnesium has remained within normal limits.   Seizure disorder Patient has a history of hemorrhagic stroke and seizures. Patient continued on Keppra 1500 mg bid, vimpat 200 mg bid, and depakene 500 mg q8h. No seizures during this hospitalizations.   Type 2 diabetes Last A1c in May elevated to 8.8%. Patient was on semglee 13u daily and sliding scale insuline. Her home Metformin was held during this  admission and Semglee was increased from 10units that she was taking at home to 13 units. Capillary Blood Glucose ranges from 100s to 200s. At discharge, patient can go back to [facility] 10 units and restart her Metformin.   Hypertension Patient on amlodipine 10 mg, losartan 100 mg, and metoprolol 100 mg. Her blood pressure has fluctuated this admission, with one episode of hypotension of 71/55 that was asymptomatic and resolved without intervention, as well as one episode of hypertensive urgency of 180/90 that was asymptomatic and resolved without intervention.   Paroxysmal Afib On metoprolol 100 mg,  diltiazem 420 mg daily, and eliquis 5 mg bid. Patient has been in sinus rhythm with normal heart rate this hospitalization.   HLD Pravachol was continued this hospitalization.   Anxiety/Depression Continue sertraline for anxiety. Seroquel was held this hospitalization due to somnolence on hospital day 2. Patient can restart Seroquel 50mg  at bedtime after discharge.    Tension Headache Patient reported tension headache and started on Acetaminophen 650mg  as needed.    Discharge Subjective: Patient seen at bedside. Reports improvement in cough and shortness of breath. She reports feeling better today. Denied chest pain, palpitations, abdominal pain, and left lower extremity swelling/discomfort.   Discharge Exam:   BP (!) 165/78 (BP Location: Left Arm)   Pulse 71   Temp 98.2 F (36.8 C) (Oral)   Resp 18   Ht 5\' 2"  (1.575 m)   Wt 117.7 kg   SpO2 96%   BMI 47.46 kg/m  General: NAD, cooperative. Alert and oriented. Cooperative.  HEENT: NCAT. vision grossly intact, sclera anicteric, moist mucous membrane. Neck: Supple, trachea midline. Lungs: normal respiratory effort on room air. CTAB. Lung sounds improved from yesterday.   Heart: RRR, no M/R/G. Radial pulse 2+  Abdomen: +BS. Soft, NTND. Extremities: No LLE edema. No RLE edema. No cyanosis or clubbing.  Neuro: Alert and oriented. No focal  deficit. Left sided weakness.  Skin: warm, dry, good turgor.  Psych: cooperative, appropriate mood and affect.     Pertinent Labs, Studies, and Procedures:     Latest Ref Rng & Units 06/13/2023    2:33 AM 06/11/2023    3:08 AM 06/10/2023    4:25 PM  CBC  WBC 4.0 - 10.5 K/uL 9.9  9.2  10.6   Hemoglobin 12.0 - 15.0 g/dL 11.9  14.7  82.9   Hematocrit 36.0 - 46.0 % 36.2  37.7  38.6   Platelets 150 - 400 K/uL 258  202  205        Latest Ref Rng & Units 06/16/2023    6:19 AM 06/15/2023    1:11 AM 06/14/2023   10:04 AM  CMP  Glucose 70 - 99 mg/dL 562  130  865   BUN 8 - 23 mg/dL 22  20  16    Creatinine 0.44 - 1.00 mg/dL 7.84  6.96  2.95   Sodium 135 - 145 mmol/L 137  134  138   Potassium 3.5 - 5.1 mmol/L 3.5  3.5  3.4   Chloride 98 - 111 mmol/L 94  94  94   CO2 22 - 32 mmol/L 31  30  30    Calcium 8.9 - 10.3 mg/dL 8.5  8.5  9.2     CT HEAD WO CONTRAST ( )  Result Date: 06/11/2023 CLINICAL DATA:  Altered mental status EXAM: CT HEAD WITHOUT CONTRAST TECHNIQUE: Contiguous axial images were obtained from the base of the skull through the vertex without intravenous contrast. RADIATION DOSE REDUCTION: This exam was performed according to the departmental dose-optimization program which includes automated exposure control, adjustment of the mA and/or kV according to patient size and/or use of iterative reconstruction technique. COMPARISON:  Brain MRI 03/25/2023, CT head 04/09/2023. FINDINGS: Brain: There is no evidence of acute intracranial hemorrhage, extra-axial fluid collection, or acute territorial infarct Background parenchymal volume is stable. The ventricles are stable in size. There is encephalomalacia in the right parietal lobe consistent with prior infarct and hemorrhage. Encephalomalacia in the right superior frontal gyrus is unchanged. Additional hypodensity in the supratentorial white matter reflecting underlying chronic small-vessel ischemic  change is stable. The pituitary and  suprasellar region are normal. There is no mass lesion. There is no mass effect or midline shift. Vascular: There is calcification of the bilateral carotid siphons. Skull: Normal. Negative for fracture or focal lesion. Sinuses/Orbits: There is a opacification of posterior right ethmoid air cells. A right lens implant is noted. The globes and orbits are otherwise unremarkable. Other: The mastoid air cells and middle ear cavities are clear. IMPRESSION: 1. No acute intracranial pathology. 2. Encephalomalacia in the right frontal and parietal lobes consistent with prior infarct and hemorrhage. Electronically Signed   By: Lesia Hausen M.D.   On: 06/11/2023 17:01   VAS [place] LOWER EXTREMITY VENOUS (DVT) (7a-7p)  Result Date: 06/11/2023  Lower Venous DVT Study Patient Name:  Allison Caldwell  Date of Exam:   06/10/2023 Medical Rec #: [id number]              Accession #:    [id number] Date of Birth: 1946/01/03              Patient Gender: F Patient Age:   63 years Exam Location:  [facility] Procedure:      VAS [place] LOWER EXTREMITY VENOUS (DVT) Referring Phys: Lorin Picket [place] --------------------------------------------------------------------------------  Indications: Edema.  Comparison Study: No prior study on file Performing Technologist: Sherren Kerns RVS  Examination Guidelines: A complete evaluation includes B-mode imaging, spectral Doppler, color Doppler, and power Doppler as needed of all accessible portions of each vessel. Bilateral testing is considered an integral part of a complete examination. Limited examinations for reoccurring indications may be performed as noted. The reflux portion of the exam is performed with the patient in reverse Trendelenburg.  +-----+---------------+---------+-----------+-------------------+--------------+ RIGHTCompressibilityPhasicitySpontaneityProperties         Thrombus Aging +-----+---------------+---------+-----------+-------------------+--------------+ CFV   Full                               pulsatile waveforms               +-----+---------------+---------+-----------+-------------------+--------------+   +--------+---------------+---------+-----------+----------------+-------------+ LEFT    CompressibilityPhasicitySpontaneityProperties      Thrombus                                                                 Aging         +--------+---------------+---------+-----------+----------------+-------------+ CFV     Full                               pulsatile                                                                waveforms                     +--------+---------------+---------+-----------+----------------+-------------+ SFJ     Full                                                             +--------+---------------+---------+-----------+----------------+-------------+  FV Prox Full                                                             +--------+---------------+---------+-----------+----------------+-------------+ FV Mid  Full                                                             +--------+---------------+---------+-----------+----------------+-------------+ FV      Full                                                             Distal                                                                   +--------+---------------+---------+-----------+----------------+-------------+ PFV     Full                                                             +--------+---------------+---------+-----------+----------------+-------------+ POP     Full                               pulsatile                                                                waveforms                     +--------+---------------+---------+-----------+----------------+-------------+ PTV     Full                                                              +--------+---------------+---------+-----------+----------------+-------------+ PERO    Full                                                             +--------+---------------+---------+-----------+----------------+-------------+     Summary: RIGHT: - No evidence of common femoral vein  obstruction.  LEFT: - There is no evidence of deep vein thrombosis in the lower extremity.  Pulsatile waveforms.  *See table(s) above for measurements and observations. Electronically signed by Gerarda Fraction on 06/11/2023 at 9:23:31 AM.    Final    CT Angio Chest PE W and/or Wo Contrast  Result Date: 06/10/2023 CLINICAL DATA:  Cough, evaluate for PE EXAM: CT ANGIOGRAPHY CHEST WITH CONTRAST TECHNIQUE: Multidetector CT imaging of the chest was performed using the standard protocol during bolus administration of intravenous contrast. Multiplanar CT image reconstructions and MIPs were obtained to evaluate the vascular anatomy. RADIATION DOSE REDUCTION: This exam was performed according to the departmental dose-optimization program which includes automated exposure control, adjustment of the mA and/or kV according to patient size and/or use of iterative reconstruction technique. CONTRAST:  75mL OMNIPAQUE IOHEXOL 350 MG/ML SOLN COMPARISON:  Chest radiograph dated 18/2 1,024. CTA chest dated 09/22/2022. FINDINGS: Cardiovascular: Satisfactory opacification of the bilateral pulmonary arteries to the segmental level. No evidence of pulmonary embolism. Again noted is a dilated main pulmonary artery, compatible with pulmonary arterial hypertension. Study is not tailored for evaluation of the thoracic aorta. No evidence of thoracic aortic aneurysm. Mild atherosclerotic calcifications of the descending thoracic aorta. The heart is normal in size. Prominent left epicardial fat. No pericardial effusion. Mild three-vessel coronary atherosclerosis. Mediastinum/Nodes: No suspicious mediastinal lymphadenopathy. Visualized thyroid is  unremarkable. Lungs/Pleura: Evaluation of the lung parenchyma is constrained by respiratory motion. Within that constraint, there are no suspicious pulmonary nodules. Scattered atelectasis in the lungs bilaterally. No focal consolidation. No pleural effusion or pneumothorax. Upper Abdomen: Visualized upper abdomen is grossly unremarkable, noting mild vascular calcifications. Musculoskeletal: Degenerative changes of the visualized thoracolumbar spine. Review of the MIP images confirms the above findings. IMPRESSION: No evidence of pulmonary embolism. Suspected pulmonary arterial hypertension. No acute cardiopulmonary disease. Aortic Atherosclerosis (ICD10-I70.0). Electronically Signed   By: Charline Bills M.D.   On: 06/10/2023 19:47   DG Chest 2 View  Result Date: 06/10/2023 CLINICAL DATA:  Cough EXAM: CHEST - 2 VIEW COMPARISON:  04/18/2023 FINDINGS: Stable cardiomegaly. Aortic atherosclerosis. No focal airspace consolidation, pleural effusion, or pneumothorax. IMPRESSION: Cardiomegaly. No acute cardiopulmonary findings. Electronically Signed   By: Duanne Guess D.O.   On: 06/10/2023 16:57     Discharge Instructions: Discharge Instructions     Diet - low sodium heart healthy   Complete by: As directed    Discharge instructions   Complete by: As directed    It was a pleasure taking care of you at [facility]. You were admitted for acute hypoxic respiratory failure and treated for COPD exacerbation. We are discharging you home now that you are doing better. Please follow the following instructions.   1) For your COPD, continue using Trelegy Ellipta inhaler once a day, use your incentive spirometer, and continue to abstain from smoking. You can continue to take Guaifenesin 600mg  twice a day for cough or benzonatate 100mg  three times a day for cough.  2) For your Pulmonary Hypertension, continue treatment for COPD listed above to help alleviate pulmonary hypertension.  3) For your Type 2  Diabetes, continue to check your blood sugar four times a day, take insulin Semglee 10 units at bedtime, and Metformin 500mg  twice a day. 4) For your Hypertension, continue Amlodipine 10mg  once a day, Losartan 100mg  once a day, and Metoprolol succinate 100mg  once a day.  5) For your Paroxysmal Atrial Fibrillation, continue Eliquis 5mg  twice a day, Diltiazem 420mg  once a day, and Metoprolol succinate  100mg  once a day.  6) For your History of Hemorrhagic Stroke and Seizures, continue valproic acid 500mg  total every 8 hours, Lacosamide 200mg  twice a day, and Keppra 1,500mg  twice a day.  7) For your Anxiety/Depression, continue sertraline 100mg  once a day and quetiapine 50mg  once a day 8) For your Hyperlipidemia, continue pravastatin 20mg  once a day at bedtime.  9) For your GERD, continue Pantoprazole 40mg  once a day 10) For your Tension Headaches, continue Tylenol 650mg  as needed every 6 hours.   Take care,  Lang Snow, OMS4  Kathleen Lime, MD   Increase activity slowly   Complete by: As directed      Dear Ms. Burkeen,  It was a pleasure taking care of you at [facility]. You were admitted for acute hypoxic respiratory failure and treated for COPD exacerbation. We are discharging you home now that you are doing better. Please follow the following instructions.   1) For your COPD, continue using Trelegy Ellipta inhaler once a day, use your incentive spirometer, and continue to abstain from smoking. You can continue to take Guaifenesin 600mg  twice a day for cough or benzonatate 100mg  three times a day for cough.  2) For your Pulmonary Hypertension, continue treatment for COPD listed above to help alleviate pulmonary hypertension.  3) For your Type 2 Diabetes, continue to check your blood sugar four times a day, take insulin Semglee 10 units at bedtime, and Metformin 500mg  twice a day. 4) For your Hypertension, continue Amlodipine 10mg  once a day, Losartan 100mg  once a day, and Metoprolol  succinate 100mg  once a day.  5) For your Paroxysmal Atrial Fibrillation, continue Eliquis 5mg  twice a day, Diltiazem 420mg  once a day, and Metoprolol succinate 100mg  once a day.  6) For your History of Hemorrhagic Stroke and Seizures, continue valproic acid 500mg  total every 8 hours, Lacosamide 200mg  twice a day, and Keppra 1,500mg  twice a day.  7) For your Anxiety/Depression, continue sertraline 100mg  once a day and quetiapine 50mg  once a day 8) For your Hyperlipidemia, continue pravastatin 20mg  once a day at bedtime.  9) For your GERD, continue Pantoprazole 40mg  once a day 10) For your Tension Headaches, continue Tylenol 650mg  as needed every 6 hours.   Take care,  Lang Snow, OMS4   Signed: Lang Snow, Medical Student Kathleen Lime MD  06/16/2023, 1:21 PM   Pager: [phone]

## 2023-06-16 NOTE — Progress Notes (Signed)
Report called and given to [place] at [facility]

## 2023-06-16 NOTE — Progress Notes (Signed)
Physical Therapy Treatment Patient Details Name: Allison Caldwell MRN: [id number] DOB: 18-Sep-1946 Today's Date: 06/16/2023   History of Present Illness Pt is a 77 y.o. female admitted from facility on 06/10/23 with LLE swelling, cough, hypoxia. Workup for COPD exacerbation. Negative DVT LLE. 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 Other PMH includes DM2, HTN, cardiomyopathy, morbid obesity, anxiety.    PT Comments  Pt greeted resting in bed and agreeable to session with noted continued confusion throughout session, perseverating on need to descend steps to home this afternoon and increased lethargy with pt closing eyes frequently, needing increased cues for alertness and arousal. Pt continues to require mod A for bed mobility and max A to attempt standing EOB with pt able to minimal clear hips. Pt with poor activity tolerance, requesting to return to supine and beginning to lie down despite encouragement to remain upright. Current plan remains appropriate to address deficits and maximize functional independence and decrease caregiver burden. Pt continues to benefit from skilled PT services to progress toward functional mobility goals.     If plan is discharge home, recommend the following: Two people to help with walking and/or transfers;A lot of help with bathing/dressing/bathroom;Assistance with feeding   Can travel by private vehicle     No  Equipment Recommendations  Other (comment) (defer to next venue)    Recommendations for Other Services       Precautions / Restrictions Precautions Precautions: Fall;Other (comment) Precaution Comments: h/o CVA with L-side weakness, L inattention Restrictions Weight Bearing Restrictions: No     Mobility  Bed Mobility Overal bed mobility: Needs Assistance Bed Mobility: Sit to Supine, Supine to Sit     Supine to sit: Mod assist Sit to supine: Mod assist    General bed mobility comments: mod assist in/out of bed, verbal cueing for safety    Transfers Overall transfer level: Needs assistance Equipment used: Rolling walker (2 wheels) Transfers: Sit to/from Stand Sit to Stand: Max assist           General transfer comment: Attempted stands at bedside x3, Pt able to put minimal weight on BLEs, not enough to lift off from bed fully with max assist and RW. L side neglect, not able to use LUE to assist    Ambulation/Gait               General Gait Details: unable to progress this session; will likely require +2 assist and lift equipment   Stairs             Wheelchair Mobility     Tilt Bed    Modified Rankin (Stroke Patients Only)       Balance Overall balance assessment: Needs assistance Sitting-balance support: No upper extremity supported, Feet supported Sitting balance-Leahy Scale: Fair Sitting balance - Comments: assist to EOB, able to maintain posture at EOB with supervision                                    Cognition Arousal: Alert Behavior During Therapy: Impulsive, Flat affect, Anxious Overall Cognitive Status: No family/caregiver present to determine baseline cognitive functioning                                 General Comments: Pt confused, was alone in room but talking  to relatives prior to entry. Pt able to state she was at [facility], name/age, and able to state she was admitted this May for recent MCA infarct, but not oriented to todays date or most recent reason for admission.        Exercises General Exercises - Lower Extremity Long Arc Quad: AROM, Right, Left, 5 reps, Seated Hip ABduction/ADduction: AROM, Right, Left, 5 reps, Seated    General Comments        Pertinent Vitals/Pain Pain Assessment Pain Assessment: Faces Faces Pain Scale: Hurts a little bit Pain Location: general Pain Descriptors / Indicators: Discomfort, Sore Pain Intervention(s):  Monitored during session, Limited activity within patient's tolerance    Home Living                          Prior Function            PT Goals (current goals can now be found in the care plan section) Acute Rehab PT Goals Patient Stated Goal: none stated PT Goal Formulation: With patient Time For Goal Achievement: 06/25/23 Progress towards PT goals: Progressing toward goals    Frequency    Min 1X/week      PT Plan      Co-evaluation              AM-PAC PT "6 Clicks" Mobility   Outcome Measure  Help needed turning from your back to your side while in a flat bed without using bedrails?: A Lot Help needed moving from lying on your back to sitting on the side of a flat bed without using bedrails?: A Lot Help needed moving to and from a bed to a chair (including a wheelchair)?: Total Help needed standing up from a chair using your arms (e.g., wheelchair or bedside chair)?: Total Help needed to walk in hospital room?: Total Help needed climbing 3-5 steps with a railing? : Total 6 Click Score: 8    End of Session   Activity Tolerance: Patient tolerated treatment well;Patient limited by fatigue Patient left: in bed;with call bell/phone within reach;with bed alarm set Nurse Communication: Mobility status PT Visit Diagnosis: Other abnormalities of gait and mobility (R26.89);Other symptoms and signs involving the nervous system (R29.898)     Time: 1207-1225 PT Time Calculation (min) (ACUTE ONLY): 18 min  Charges:    $Therapeutic Activity: 8-22 mins PT General Charges $$ ACUTE PT VISIT: 1 Visit                     Heraclio Seidman R. PTA Acute Rehabilitation Services Office: [phone]   Catalina Antigua 06/16/2023, 4:12 PM

## 2023-06-16 NOTE — Discharge Instructions (Addendum)
Dear [name],  It was a pleasure taking care of you at [facility]. You were admitted for acute hypoxic respiratory failure and treated for COPD exacerbation. We are discharging you home now that you are doing better. Please follow the following instructions.   1) For your COPD, continue using Trelegy Ellipta inhaler once a day, use your incentive spirometer, and continue to abstain from smoking. You can continue to take Guaifenesin 600mg  twice a day for cough or benzonatate 100mg  three times a day for cough.  2) For your Pulmonary Hypertension, continue treatment for COPD listed above to help alleviate pulmonary hypertension.  3) For your Type 2 Diabetes, continue to check your blood sugar four times a day, take insulin Semglee 10 units at bedtime, and Metformin 500mg  twice a day. 4) For your Hypertension, continue Amlodipine 10mg  once a day, Losartan 100mg  once a day, and Metoprolol succinate 100mg  once a day.  5) For your Paroxysmal Atrial Fibrillation, continue Eliquis 5mg  twice a day, Diltiazem 420mg  once a day, and Metoprolol succinate 100mg  once a day.  6) For your History of Hemorrhagic Stroke and Seizures, continue valproic acid 500mg  total every 8 hours, Lacosamide 200mg  twice a day, and Keppra 1,500mg  twice a day.  7) For your Anxiety/Depression, continue sertraline 100mg  once a day and quetiapine 50mg  once a day 8) For your Hyperlipidemia, continue pravastatin 20mg  once a day at bedtime.  9) For your GERD, continue Pantoprazole 40mg  once a day 10) For your Tension Headaches, continue Tylenol 650mg  as needed every 6 hours.   Take care,  Allison Caldwell, [place]

## 2023-06-16 NOTE — TOC Transition Note (Signed)
Transition of Care [facility]) - CM/SW Discharge Note   Patient Details  Name: Allison Caldwell MRN: [id number] Date of Birth: May 07, 1946  Transition of [facility] CM/SW Contact:  Jayquon Theiler A [place], Theresia Majors Phone Number: 06/16/2023, 5:22 PM   Clinical Narrative:     Patient will DC to: [facility]   Anticipated DC date: 06/16/23  Family notified: Enrique Sack   Transport by: Sharin Mons      Per MD patient ready for DC to [facility]. RN, patient, patient's family, and facility notified of DC. Discharge Summary and FL2 sent to facility. RN to call report prior to discharge ( Room 200, [phone]). DC packet on chart. Ambulance transport requested for patient.     CSW will sign off for now as social work intervention is no longer needed. Please consult [place] again if new needs arise.   Final next level of care: Long Term Nursing Home Barriers to Discharge: Barriers Resolved   Patient Goals and CMS Choice      Discharge Placement                Patient chooses bed at: [facility] Patient to be transferred to facility by: [facility] Name of family member notified: Enrique Sack Patient and family notified of of transfer: 06/16/23  Discharge Plan and Services Additional resources added to the After Visit Summary for                                       Social Determinants of Health (SDOH) Interventions SDOH Screenings   Food Insecurity: No Food Insecurity (06/10/2023)  Housing: Low Risk  (06/10/2023)  Transportation Needs: No Transportation Needs (06/10/2023)  Utilities: Not At Risk (06/10/2023)  Depression (PHQ2-9): Medium Risk (06/29/2021)  Tobacco Use: Medium Risk (06/10/2023)     Readmission Risk Interventions     No data to display

## 2023-06-19 DIAGNOSIS — W1789XA Other fall from one level to another, initial encounter: Secondary | ICD-10-CM | POA: Diagnosis not present

## 2023-06-19 DIAGNOSIS — M79672 Pain in left foot: Secondary | ICD-10-CM | POA: Diagnosis not present

## 2023-06-19 DIAGNOSIS — E1165 Type 2 diabetes mellitus with hyperglycemia: Secondary | ICD-10-CM | POA: Diagnosis not present

## 2023-06-19 DIAGNOSIS — I480 Paroxysmal atrial fibrillation: Secondary | ICD-10-CM | POA: Diagnosis not present

## 2023-06-21 DIAGNOSIS — M6281 Muscle weakness (generalized): Secondary | ICD-10-CM | POA: Diagnosis not present

## 2023-06-21 DIAGNOSIS — I69391 Dysphagia following cerebral infarction: Secondary | ICD-10-CM | POA: Diagnosis not present

## 2023-06-21 DIAGNOSIS — G40919 Epilepsy, unspecified, intractable, without status epilepticus: Secondary | ICD-10-CM | POA: Diagnosis not present

## 2023-06-21 DIAGNOSIS — I152 Hypertension secondary to endocrine disorders: Secondary | ICD-10-CM | POA: Diagnosis not present

## 2023-06-21 DIAGNOSIS — R1312 Dysphagia, oropharyngeal phase: Secondary | ICD-10-CM | POA: Diagnosis not present

## 2023-06-21 DIAGNOSIS — I69354 Hemiplegia and hemiparesis following cerebral infarction affecting left non-dominant side: Secondary | ICD-10-CM | POA: Diagnosis not present

## 2023-06-21 DIAGNOSIS — J9601 Acute respiratory failure with hypoxia: Secondary | ICD-10-CM | POA: Diagnosis not present

## 2023-06-21 DIAGNOSIS — E1165 Type 2 diabetes mellitus with hyperglycemia: Secondary | ICD-10-CM | POA: Diagnosis not present

## 2023-06-21 DIAGNOSIS — I480 Paroxysmal atrial fibrillation: Secondary | ICD-10-CM | POA: Diagnosis not present

## 2023-06-22 DIAGNOSIS — G40919 Epilepsy, unspecified, intractable, without status epilepticus: Secondary | ICD-10-CM | POA: Diagnosis not present

## 2023-06-22 DIAGNOSIS — R1312 Dysphagia, oropharyngeal phase: Secondary | ICD-10-CM | POA: Diagnosis not present

## 2023-06-22 DIAGNOSIS — I480 Paroxysmal atrial fibrillation: Secondary | ICD-10-CM | POA: Diagnosis not present

## 2023-06-22 DIAGNOSIS — J9601 Acute respiratory failure with hypoxia: Secondary | ICD-10-CM | POA: Diagnosis not present

## 2023-06-22 DIAGNOSIS — I69354 Hemiplegia and hemiparesis following cerebral infarction affecting left non-dominant side: Secondary | ICD-10-CM | POA: Diagnosis not present

## 2023-06-22 DIAGNOSIS — E1165 Type 2 diabetes mellitus with hyperglycemia: Secondary | ICD-10-CM | POA: Diagnosis not present

## 2023-06-22 DIAGNOSIS — I69391 Dysphagia following cerebral infarction: Secondary | ICD-10-CM | POA: Diagnosis not present

## 2023-06-22 DIAGNOSIS — M6281 Muscle weakness (generalized): Secondary | ICD-10-CM | POA: Diagnosis not present

## 2023-06-22 DIAGNOSIS — I152 Hypertension secondary to endocrine disorders: Secondary | ICD-10-CM | POA: Diagnosis not present

## 2023-06-23 DIAGNOSIS — I480 Paroxysmal atrial fibrillation: Secondary | ICD-10-CM | POA: Diagnosis not present

## 2023-06-23 DIAGNOSIS — I69391 Dysphagia following cerebral infarction: Secondary | ICD-10-CM | POA: Diagnosis not present

## 2023-06-23 DIAGNOSIS — E1165 Type 2 diabetes mellitus with hyperglycemia: Secondary | ICD-10-CM | POA: Diagnosis not present

## 2023-06-23 DIAGNOSIS — G40919 Epilepsy, unspecified, intractable, without status epilepticus: Secondary | ICD-10-CM | POA: Diagnosis not present

## 2023-06-23 DIAGNOSIS — J9601 Acute respiratory failure with hypoxia: Secondary | ICD-10-CM | POA: Diagnosis not present

## 2023-06-23 DIAGNOSIS — I69354 Hemiplegia and hemiparesis following cerebral infarction affecting left non-dominant side: Secondary | ICD-10-CM | POA: Diagnosis not present

## 2023-06-23 DIAGNOSIS — I152 Hypertension secondary to endocrine disorders: Secondary | ICD-10-CM | POA: Diagnosis not present

## 2023-06-23 DIAGNOSIS — M6281 Muscle weakness (generalized): Secondary | ICD-10-CM | POA: Diagnosis not present

## 2023-06-23 DIAGNOSIS — R1312 Dysphagia, oropharyngeal phase: Secondary | ICD-10-CM | POA: Diagnosis not present

## 2023-06-24 DIAGNOSIS — M6281 Muscle weakness (generalized): Secondary | ICD-10-CM | POA: Diagnosis not present

## 2023-06-24 DIAGNOSIS — R1312 Dysphagia, oropharyngeal phase: Secondary | ICD-10-CM | POA: Diagnosis not present

## 2023-06-24 DIAGNOSIS — E1165 Type 2 diabetes mellitus with hyperglycemia: Secondary | ICD-10-CM | POA: Diagnosis not present

## 2023-06-24 DIAGNOSIS — I69354 Hemiplegia and hemiparesis following cerebral infarction affecting left non-dominant side: Secondary | ICD-10-CM | POA: Diagnosis not present

## 2023-06-24 DIAGNOSIS — G40919 Epilepsy, unspecified, intractable, without status epilepticus: Secondary | ICD-10-CM | POA: Diagnosis not present

## 2023-06-24 DIAGNOSIS — J9601 Acute respiratory failure with hypoxia: Secondary | ICD-10-CM | POA: Diagnosis not present

## 2023-06-24 DIAGNOSIS — I480 Paroxysmal atrial fibrillation: Secondary | ICD-10-CM | POA: Diagnosis not present

## 2023-06-24 DIAGNOSIS — I152 Hypertension secondary to endocrine disorders: Secondary | ICD-10-CM | POA: Diagnosis not present

## 2023-06-24 DIAGNOSIS — I69391 Dysphagia following cerebral infarction: Secondary | ICD-10-CM | POA: Diagnosis not present

## 2023-06-25 DIAGNOSIS — I69391 Dysphagia following cerebral infarction: Secondary | ICD-10-CM | POA: Diagnosis not present

## 2023-06-25 DIAGNOSIS — I152 Hypertension secondary to endocrine disorders: Secondary | ICD-10-CM | POA: Diagnosis not present

## 2023-06-25 DIAGNOSIS — G40919 Epilepsy, unspecified, intractable, without status epilepticus: Secondary | ICD-10-CM | POA: Diagnosis not present

## 2023-06-25 DIAGNOSIS — M6281 Muscle weakness (generalized): Secondary | ICD-10-CM | POA: Diagnosis not present

## 2023-06-25 DIAGNOSIS — I480 Paroxysmal atrial fibrillation: Secondary | ICD-10-CM | POA: Diagnosis not present

## 2023-06-25 DIAGNOSIS — J9601 Acute respiratory failure with hypoxia: Secondary | ICD-10-CM | POA: Diagnosis not present

## 2023-06-25 DIAGNOSIS — I69354 Hemiplegia and hemiparesis following cerebral infarction affecting left non-dominant side: Secondary | ICD-10-CM | POA: Diagnosis not present

## 2023-06-25 DIAGNOSIS — R1312 Dysphagia, oropharyngeal phase: Secondary | ICD-10-CM | POA: Diagnosis not present

## 2023-06-25 DIAGNOSIS — E1165 Type 2 diabetes mellitus with hyperglycemia: Secondary | ICD-10-CM | POA: Diagnosis not present

## 2023-06-26 DIAGNOSIS — J9601 Acute respiratory failure with hypoxia: Secondary | ICD-10-CM | POA: Diagnosis not present

## 2023-06-26 DIAGNOSIS — I152 Hypertension secondary to endocrine disorders: Secondary | ICD-10-CM | POA: Diagnosis not present

## 2023-06-26 DIAGNOSIS — I69391 Dysphagia following cerebral infarction: Secondary | ICD-10-CM | POA: Diagnosis not present

## 2023-06-26 DIAGNOSIS — M6281 Muscle weakness (generalized): Secondary | ICD-10-CM | POA: Diagnosis not present

## 2023-06-26 DIAGNOSIS — E1165 Type 2 diabetes mellitus with hyperglycemia: Secondary | ICD-10-CM | POA: Diagnosis not present

## 2023-06-26 DIAGNOSIS — I69354 Hemiplegia and hemiparesis following cerebral infarction affecting left non-dominant side: Secondary | ICD-10-CM | POA: Diagnosis not present

## 2023-06-26 DIAGNOSIS — I480 Paroxysmal atrial fibrillation: Secondary | ICD-10-CM | POA: Diagnosis not present

## 2023-06-26 DIAGNOSIS — R1312 Dysphagia, oropharyngeal phase: Secondary | ICD-10-CM | POA: Diagnosis not present

## 2023-06-26 DIAGNOSIS — G40919 Epilepsy, unspecified, intractable, without status epilepticus: Secondary | ICD-10-CM | POA: Diagnosis not present

## 2023-06-27 DIAGNOSIS — I69391 Dysphagia following cerebral infarction: Secondary | ICD-10-CM | POA: Diagnosis not present

## 2023-06-27 DIAGNOSIS — J9601 Acute respiratory failure with hypoxia: Secondary | ICD-10-CM | POA: Diagnosis not present

## 2023-06-27 DIAGNOSIS — M6281 Muscle weakness (generalized): Secondary | ICD-10-CM | POA: Diagnosis not present

## 2023-06-27 DIAGNOSIS — R1312 Dysphagia, oropharyngeal phase: Secondary | ICD-10-CM | POA: Diagnosis not present

## 2023-06-27 DIAGNOSIS — I480 Paroxysmal atrial fibrillation: Secondary | ICD-10-CM | POA: Diagnosis not present

## 2023-06-27 DIAGNOSIS — G40919 Epilepsy, unspecified, intractable, without status epilepticus: Secondary | ICD-10-CM | POA: Diagnosis not present

## 2023-06-27 DIAGNOSIS — I69354 Hemiplegia and hemiparesis following cerebral infarction affecting left non-dominant side: Secondary | ICD-10-CM | POA: Diagnosis not present

## 2023-06-27 DIAGNOSIS — I152 Hypertension secondary to endocrine disorders: Secondary | ICD-10-CM | POA: Diagnosis not present

## 2023-06-27 DIAGNOSIS — E1165 Type 2 diabetes mellitus with hyperglycemia: Secondary | ICD-10-CM | POA: Diagnosis not present

## 2023-06-28 DIAGNOSIS — I480 Paroxysmal atrial fibrillation: Secondary | ICD-10-CM | POA: Diagnosis not present

## 2023-06-28 DIAGNOSIS — I69354 Hemiplegia and hemiparesis following cerebral infarction affecting left non-dominant side: Secondary | ICD-10-CM | POA: Diagnosis not present

## 2023-06-28 DIAGNOSIS — R1312 Dysphagia, oropharyngeal phase: Secondary | ICD-10-CM | POA: Diagnosis not present

## 2023-06-28 DIAGNOSIS — E1165 Type 2 diabetes mellitus with hyperglycemia: Secondary | ICD-10-CM | POA: Diagnosis not present

## 2023-06-28 DIAGNOSIS — J9601 Acute respiratory failure with hypoxia: Secondary | ICD-10-CM | POA: Diagnosis not present

## 2023-06-28 DIAGNOSIS — M6281 Muscle weakness (generalized): Secondary | ICD-10-CM | POA: Diagnosis not present

## 2023-06-28 DIAGNOSIS — G40919 Epilepsy, unspecified, intractable, without status epilepticus: Secondary | ICD-10-CM | POA: Diagnosis not present

## 2023-06-28 DIAGNOSIS — I69391 Dysphagia following cerebral infarction: Secondary | ICD-10-CM | POA: Diagnosis not present

## 2023-06-28 DIAGNOSIS — I152 Hypertension secondary to endocrine disorders: Secondary | ICD-10-CM | POA: Diagnosis not present

## 2023-06-29 DIAGNOSIS — J449 Chronic obstructive pulmonary disease, unspecified: Secondary | ICD-10-CM | POA: Diagnosis not present

## 2023-06-29 DIAGNOSIS — I152 Hypertension secondary to endocrine disorders: Secondary | ICD-10-CM | POA: Diagnosis not present

## 2023-06-29 DIAGNOSIS — I619 Nontraumatic intracerebral hemorrhage, unspecified: Secondary | ICD-10-CM | POA: Diagnosis not present

## 2023-06-29 DIAGNOSIS — E1165 Type 2 diabetes mellitus with hyperglycemia: Secondary | ICD-10-CM | POA: Diagnosis not present

## 2023-06-29 DIAGNOSIS — R1312 Dysphagia, oropharyngeal phase: Secondary | ICD-10-CM | POA: Diagnosis not present

## 2023-06-29 DIAGNOSIS — M6281 Muscle weakness (generalized): Secondary | ICD-10-CM | POA: Diagnosis not present

## 2023-06-29 DIAGNOSIS — I69391 Dysphagia following cerebral infarction: Secondary | ICD-10-CM | POA: Diagnosis not present

## 2023-06-29 DIAGNOSIS — F419 Anxiety disorder, unspecified: Secondary | ICD-10-CM | POA: Diagnosis not present

## 2023-06-29 DIAGNOSIS — G40919 Epilepsy, unspecified, intractable, without status epilepticus: Secondary | ICD-10-CM | POA: Diagnosis not present

## 2023-06-29 DIAGNOSIS — J9601 Acute respiratory failure with hypoxia: Secondary | ICD-10-CM | POA: Diagnosis not present

## 2023-06-29 DIAGNOSIS — I480 Paroxysmal atrial fibrillation: Secondary | ICD-10-CM | POA: Diagnosis not present

## 2023-06-29 DIAGNOSIS — I69354 Hemiplegia and hemiparesis following cerebral infarction affecting left non-dominant side: Secondary | ICD-10-CM | POA: Diagnosis not present

## 2023-06-30 DIAGNOSIS — I69391 Dysphagia following cerebral infarction: Secondary | ICD-10-CM | POA: Diagnosis not present

## 2023-06-30 DIAGNOSIS — M6281 Muscle weakness (generalized): Secondary | ICD-10-CM | POA: Diagnosis not present

## 2023-06-30 DIAGNOSIS — R1312 Dysphagia, oropharyngeal phase: Secondary | ICD-10-CM | POA: Diagnosis not present

## 2023-06-30 DIAGNOSIS — G40919 Epilepsy, unspecified, intractable, without status epilepticus: Secondary | ICD-10-CM | POA: Diagnosis not present

## 2023-06-30 DIAGNOSIS — E1165 Type 2 diabetes mellitus with hyperglycemia: Secondary | ICD-10-CM | POA: Diagnosis not present

## 2023-06-30 DIAGNOSIS — I480 Paroxysmal atrial fibrillation: Secondary | ICD-10-CM | POA: Diagnosis not present

## 2023-06-30 DIAGNOSIS — J9601 Acute respiratory failure with hypoxia: Secondary | ICD-10-CM | POA: Diagnosis not present

## 2023-06-30 DIAGNOSIS — I69354 Hemiplegia and hemiparesis following cerebral infarction affecting left non-dominant side: Secondary | ICD-10-CM | POA: Diagnosis not present

## 2023-06-30 DIAGNOSIS — I152 Hypertension secondary to endocrine disorders: Secondary | ICD-10-CM | POA: Diagnosis not present

## 2023-07-01 DIAGNOSIS — M6281 Muscle weakness (generalized): Secondary | ICD-10-CM | POA: Diagnosis not present

## 2023-07-01 DIAGNOSIS — J9601 Acute respiratory failure with hypoxia: Secondary | ICD-10-CM | POA: Diagnosis not present

## 2023-07-01 DIAGNOSIS — R1312 Dysphagia, oropharyngeal phase: Secondary | ICD-10-CM | POA: Diagnosis not present

## 2023-07-01 DIAGNOSIS — I69354 Hemiplegia and hemiparesis following cerebral infarction affecting left non-dominant side: Secondary | ICD-10-CM | POA: Diagnosis not present

## 2023-07-01 DIAGNOSIS — I480 Paroxysmal atrial fibrillation: Secondary | ICD-10-CM | POA: Diagnosis not present

## 2023-07-01 DIAGNOSIS — I69391 Dysphagia following cerebral infarction: Secondary | ICD-10-CM | POA: Diagnosis not present

## 2023-07-01 DIAGNOSIS — G40919 Epilepsy, unspecified, intractable, without status epilepticus: Secondary | ICD-10-CM | POA: Diagnosis not present

## 2023-07-01 DIAGNOSIS — E1165 Type 2 diabetes mellitus with hyperglycemia: Secondary | ICD-10-CM | POA: Diagnosis not present

## 2023-07-01 DIAGNOSIS — I152 Hypertension secondary to endocrine disorders: Secondary | ICD-10-CM | POA: Diagnosis not present

## 2023-07-03 DIAGNOSIS — M6281 Muscle weakness (generalized): Secondary | ICD-10-CM | POA: Diagnosis not present

## 2023-07-03 DIAGNOSIS — I480 Paroxysmal atrial fibrillation: Secondary | ICD-10-CM | POA: Diagnosis not present

## 2023-07-03 DIAGNOSIS — J9601 Acute respiratory failure with hypoxia: Secondary | ICD-10-CM | POA: Diagnosis not present

## 2023-07-03 DIAGNOSIS — M79672 Pain in left foot: Secondary | ICD-10-CM | POA: Diagnosis not present

## 2023-07-03 DIAGNOSIS — E1165 Type 2 diabetes mellitus with hyperglycemia: Secondary | ICD-10-CM | POA: Diagnosis not present

## 2023-07-04 ENCOUNTER — Telehealth: Payer: Self-pay | Admitting: Neurology

## 2023-07-04 DIAGNOSIS — M6281 Muscle weakness (generalized): Secondary | ICD-10-CM | POA: Diagnosis not present

## 2023-07-04 DIAGNOSIS — J9601 Acute respiratory failure with hypoxia: Secondary | ICD-10-CM | POA: Diagnosis not present

## 2023-07-04 NOTE — Telephone Encounter (Signed)
LVM and sent text msg informing pt of need to reschedule 07/05/23 appt - MD out

## 2023-07-05 ENCOUNTER — Inpatient Hospital Stay: Payer: Medicare Other | Admitting: Neurology

## 2023-07-05 DIAGNOSIS — J9601 Acute respiratory failure with hypoxia: Secondary | ICD-10-CM | POA: Diagnosis not present

## 2023-07-05 DIAGNOSIS — M6281 Muscle weakness (generalized): Secondary | ICD-10-CM | POA: Diagnosis not present

## 2023-07-06 DIAGNOSIS — J9601 Acute respiratory failure with hypoxia: Secondary | ICD-10-CM | POA: Diagnosis not present

## 2023-07-06 DIAGNOSIS — M6281 Muscle weakness (generalized): Secondary | ICD-10-CM | POA: Diagnosis not present

## 2023-07-07 DIAGNOSIS — J9601 Acute respiratory failure with hypoxia: Secondary | ICD-10-CM | POA: Diagnosis not present

## 2023-07-07 DIAGNOSIS — M6281 Muscle weakness (generalized): Secondary | ICD-10-CM | POA: Diagnosis not present

## 2023-07-08 DIAGNOSIS — J9601 Acute respiratory failure with hypoxia: Secondary | ICD-10-CM | POA: Diagnosis not present

## 2023-07-08 DIAGNOSIS — M6281 Muscle weakness (generalized): Secondary | ICD-10-CM | POA: Diagnosis not present

## 2023-07-10 DIAGNOSIS — E440 Moderate protein-calorie malnutrition: Secondary | ICD-10-CM | POA: Diagnosis not present

## 2023-07-10 DIAGNOSIS — I480 Paroxysmal atrial fibrillation: Secondary | ICD-10-CM | POA: Diagnosis not present

## 2023-07-10 DIAGNOSIS — J9601 Acute respiratory failure with hypoxia: Secondary | ICD-10-CM | POA: Diagnosis not present

## 2023-07-10 DIAGNOSIS — E1165 Type 2 diabetes mellitus with hyperglycemia: Secondary | ICD-10-CM | POA: Diagnosis not present

## 2023-07-10 DIAGNOSIS — M6281 Muscle weakness (generalized): Secondary | ICD-10-CM | POA: Diagnosis not present

## 2023-07-11 DIAGNOSIS — J9601 Acute respiratory failure with hypoxia: Secondary | ICD-10-CM | POA: Diagnosis not present

## 2023-07-11 DIAGNOSIS — M6281 Muscle weakness (generalized): Secondary | ICD-10-CM | POA: Diagnosis not present

## 2023-07-12 DIAGNOSIS — J9601 Acute respiratory failure with hypoxia: Secondary | ICD-10-CM | POA: Diagnosis not present

## 2023-07-12 DIAGNOSIS — M6281 Muscle weakness (generalized): Secondary | ICD-10-CM | POA: Diagnosis not present

## 2023-07-13 DIAGNOSIS — M6281 Muscle weakness (generalized): Secondary | ICD-10-CM | POA: Diagnosis not present

## 2023-07-13 DIAGNOSIS — J9601 Acute respiratory failure with hypoxia: Secondary | ICD-10-CM | POA: Diagnosis not present

## 2023-07-18 DIAGNOSIS — E119 Type 2 diabetes mellitus without complications: Secondary | ICD-10-CM | POA: Diagnosis not present

## 2023-07-18 DIAGNOSIS — I10 Essential (primary) hypertension: Secondary | ICD-10-CM | POA: Diagnosis not present

## 2023-07-18 DIAGNOSIS — E559 Vitamin D deficiency, unspecified: Secondary | ICD-10-CM | POA: Diagnosis not present

## 2023-07-19 DIAGNOSIS — E02 Subclinical iodine-deficiency hypothyroidism: Secondary | ICD-10-CM | POA: Diagnosis not present

## 2023-07-19 DIAGNOSIS — E1165 Type 2 diabetes mellitus with hyperglycemia: Secondary | ICD-10-CM | POA: Diagnosis not present

## 2023-07-20 DIAGNOSIS — M546 Pain in thoracic spine: Secondary | ICD-10-CM | POA: Diagnosis not present

## 2023-07-21 DIAGNOSIS — W1789XA Other fall from one level to another, initial encounter: Secondary | ICD-10-CM | POA: Diagnosis not present

## 2023-07-21 DIAGNOSIS — R52 Pain, unspecified: Secondary | ICD-10-CM | POA: Diagnosis not present

## 2023-07-24 DIAGNOSIS — E1165 Type 2 diabetes mellitus with hyperglycemia: Secondary | ICD-10-CM | POA: Diagnosis not present

## 2023-07-24 DIAGNOSIS — E440 Moderate protein-calorie malnutrition: Secondary | ICD-10-CM | POA: Diagnosis not present

## 2023-07-24 DIAGNOSIS — I480 Paroxysmal atrial fibrillation: Secondary | ICD-10-CM | POA: Diagnosis not present

## 2023-07-24 DIAGNOSIS — J449 Chronic obstructive pulmonary disease, unspecified: Secondary | ICD-10-CM | POA: Diagnosis not present

## 2023-08-07 DIAGNOSIS — E440 Moderate protein-calorie malnutrition: Secondary | ICD-10-CM | POA: Diagnosis not present

## 2023-08-07 DIAGNOSIS — E1165 Type 2 diabetes mellitus with hyperglycemia: Secondary | ICD-10-CM | POA: Diagnosis not present

## 2023-08-07 DIAGNOSIS — M6281 Muscle weakness (generalized): Secondary | ICD-10-CM | POA: Diagnosis not present

## 2023-08-08 DIAGNOSIS — I69391 Dysphagia following cerebral infarction: Secondary | ICD-10-CM | POA: Diagnosis not present

## 2023-08-08 DIAGNOSIS — R1311 Dysphagia, oral phase: Secondary | ICD-10-CM | POA: Diagnosis not present

## 2023-08-08 DIAGNOSIS — I69354 Hemiplegia and hemiparesis following cerebral infarction affecting left non-dominant side: Secondary | ICD-10-CM | POA: Diagnosis not present

## 2023-08-09 DIAGNOSIS — I69391 Dysphagia following cerebral infarction: Secondary | ICD-10-CM | POA: Diagnosis not present

## 2023-08-09 DIAGNOSIS — R1311 Dysphagia, oral phase: Secondary | ICD-10-CM | POA: Diagnosis not present

## 2023-08-09 DIAGNOSIS — I69354 Hemiplegia and hemiparesis following cerebral infarction affecting left non-dominant side: Secondary | ICD-10-CM | POA: Diagnosis not present

## 2023-08-10 DIAGNOSIS — I69354 Hemiplegia and hemiparesis following cerebral infarction affecting left non-dominant side: Secondary | ICD-10-CM | POA: Diagnosis not present

## 2023-08-10 DIAGNOSIS — I69391 Dysphagia following cerebral infarction: Secondary | ICD-10-CM | POA: Diagnosis not present

## 2023-08-10 DIAGNOSIS — R1311 Dysphagia, oral phase: Secondary | ICD-10-CM | POA: Diagnosis not present

## 2023-08-11 DIAGNOSIS — I69354 Hemiplegia and hemiparesis following cerebral infarction affecting left non-dominant side: Secondary | ICD-10-CM | POA: Diagnosis not present

## 2023-08-11 DIAGNOSIS — R1311 Dysphagia, oral phase: Secondary | ICD-10-CM | POA: Diagnosis not present

## 2023-08-11 DIAGNOSIS — I69391 Dysphagia following cerebral infarction: Secondary | ICD-10-CM | POA: Diagnosis not present

## 2023-08-14 DIAGNOSIS — R1311 Dysphagia, oral phase: Secondary | ICD-10-CM | POA: Diagnosis not present

## 2023-08-14 DIAGNOSIS — I69391 Dysphagia following cerebral infarction: Secondary | ICD-10-CM | POA: Diagnosis not present

## 2023-08-14 DIAGNOSIS — I69354 Hemiplegia and hemiparesis following cerebral infarction affecting left non-dominant side: Secondary | ICD-10-CM | POA: Diagnosis not present

## 2023-08-15 DIAGNOSIS — R1311 Dysphagia, oral phase: Secondary | ICD-10-CM | POA: Diagnosis not present

## 2023-08-15 DIAGNOSIS — I69391 Dysphagia following cerebral infarction: Secondary | ICD-10-CM | POA: Diagnosis not present

## 2023-08-15 DIAGNOSIS — I69354 Hemiplegia and hemiparesis following cerebral infarction affecting left non-dominant side: Secondary | ICD-10-CM | POA: Diagnosis not present

## 2023-08-16 DIAGNOSIS — R1311 Dysphagia, oral phase: Secondary | ICD-10-CM | POA: Diagnosis not present

## 2023-08-16 DIAGNOSIS — I69354 Hemiplegia and hemiparesis following cerebral infarction affecting left non-dominant side: Secondary | ICD-10-CM | POA: Diagnosis not present

## 2023-08-16 DIAGNOSIS — I69391 Dysphagia following cerebral infarction: Secondary | ICD-10-CM | POA: Diagnosis not present

## 2023-08-17 DIAGNOSIS — I69391 Dysphagia following cerebral infarction: Secondary | ICD-10-CM | POA: Diagnosis not present

## 2023-08-17 DIAGNOSIS — I69354 Hemiplegia and hemiparesis following cerebral infarction affecting left non-dominant side: Secondary | ICD-10-CM | POA: Diagnosis not present

## 2023-08-17 DIAGNOSIS — R1311 Dysphagia, oral phase: Secondary | ICD-10-CM | POA: Diagnosis not present

## 2023-08-18 DIAGNOSIS — I69391 Dysphagia following cerebral infarction: Secondary | ICD-10-CM | POA: Diagnosis not present

## 2023-08-18 DIAGNOSIS — R1311 Dysphagia, oral phase: Secondary | ICD-10-CM | POA: Diagnosis not present

## 2023-08-18 DIAGNOSIS — I69354 Hemiplegia and hemiparesis following cerebral infarction affecting left non-dominant side: Secondary | ICD-10-CM | POA: Diagnosis not present

## 2023-08-20 DIAGNOSIS — I69354 Hemiplegia and hemiparesis following cerebral infarction affecting left non-dominant side: Secondary | ICD-10-CM | POA: Diagnosis not present

## 2023-08-20 DIAGNOSIS — R1311 Dysphagia, oral phase: Secondary | ICD-10-CM | POA: Diagnosis not present

## 2023-08-20 DIAGNOSIS — I69391 Dysphagia following cerebral infarction: Secondary | ICD-10-CM | POA: Diagnosis not present

## 2023-08-21 DIAGNOSIS — I69354 Hemiplegia and hemiparesis following cerebral infarction affecting left non-dominant side: Secondary | ICD-10-CM | POA: Diagnosis not present

## 2023-08-21 DIAGNOSIS — R1311 Dysphagia, oral phase: Secondary | ICD-10-CM | POA: Diagnosis not present

## 2023-08-21 DIAGNOSIS — I69391 Dysphagia following cerebral infarction: Secondary | ICD-10-CM | POA: Diagnosis not present

## 2023-08-22 DIAGNOSIS — I69391 Dysphagia following cerebral infarction: Secondary | ICD-10-CM | POA: Diagnosis not present

## 2023-08-22 DIAGNOSIS — R1311 Dysphagia, oral phase: Secondary | ICD-10-CM | POA: Diagnosis not present

## 2023-08-22 DIAGNOSIS — I69354 Hemiplegia and hemiparesis following cerebral infarction affecting left non-dominant side: Secondary | ICD-10-CM | POA: Diagnosis not present

## 2023-08-23 DIAGNOSIS — I69391 Dysphagia following cerebral infarction: Secondary | ICD-10-CM | POA: Diagnosis not present

## 2023-08-23 DIAGNOSIS — I69354 Hemiplegia and hemiparesis following cerebral infarction affecting left non-dominant side: Secondary | ICD-10-CM | POA: Diagnosis not present

## 2023-08-23 DIAGNOSIS — R1311 Dysphagia, oral phase: Secondary | ICD-10-CM | POA: Diagnosis not present

## 2023-08-24 DIAGNOSIS — I69354 Hemiplegia and hemiparesis following cerebral infarction affecting left non-dominant side: Secondary | ICD-10-CM | POA: Diagnosis not present

## 2023-08-24 DIAGNOSIS — R1311 Dysphagia, oral phase: Secondary | ICD-10-CM | POA: Diagnosis not present

## 2023-08-24 DIAGNOSIS — I69391 Dysphagia following cerebral infarction: Secondary | ICD-10-CM | POA: Diagnosis not present

## 2023-08-25 DIAGNOSIS — I69354 Hemiplegia and hemiparesis following cerebral infarction affecting left non-dominant side: Secondary | ICD-10-CM | POA: Diagnosis not present

## 2023-08-25 DIAGNOSIS — I69391 Dysphagia following cerebral infarction: Secondary | ICD-10-CM | POA: Diagnosis not present

## 2023-08-25 DIAGNOSIS — R1311 Dysphagia, oral phase: Secondary | ICD-10-CM | POA: Diagnosis not present

## 2023-08-25 DIAGNOSIS — E1165 Type 2 diabetes mellitus with hyperglycemia: Secondary | ICD-10-CM | POA: Diagnosis not present

## 2023-08-25 DIAGNOSIS — G40919 Epilepsy, unspecified, intractable, without status epilepticus: Secondary | ICD-10-CM | POA: Diagnosis not present

## 2023-08-25 DIAGNOSIS — I480 Paroxysmal atrial fibrillation: Secondary | ICD-10-CM | POA: Diagnosis not present

## 2023-08-28 DIAGNOSIS — I69391 Dysphagia following cerebral infarction: Secondary | ICD-10-CM | POA: Diagnosis not present

## 2023-08-28 DIAGNOSIS — I69354 Hemiplegia and hemiparesis following cerebral infarction affecting left non-dominant side: Secondary | ICD-10-CM | POA: Diagnosis not present

## 2023-08-28 DIAGNOSIS — R1311 Dysphagia, oral phase: Secondary | ICD-10-CM | POA: Diagnosis not present

## 2023-08-29 DIAGNOSIS — R1311 Dysphagia, oral phase: Secondary | ICD-10-CM | POA: Diagnosis not present

## 2023-08-29 DIAGNOSIS — I69391 Dysphagia following cerebral infarction: Secondary | ICD-10-CM | POA: Diagnosis not present

## 2023-08-29 DIAGNOSIS — I69354 Hemiplegia and hemiparesis following cerebral infarction affecting left non-dominant side: Secondary | ICD-10-CM | POA: Diagnosis not present

## 2023-08-30 DIAGNOSIS — I69391 Dysphagia following cerebral infarction: Secondary | ICD-10-CM | POA: Diagnosis not present

## 2023-08-30 DIAGNOSIS — R1311 Dysphagia, oral phase: Secondary | ICD-10-CM | POA: Diagnosis not present

## 2023-08-30 DIAGNOSIS — I69354 Hemiplegia and hemiparesis following cerebral infarction affecting left non-dominant side: Secondary | ICD-10-CM | POA: Diagnosis not present

## 2023-09-05 DIAGNOSIS — I69354 Hemiplegia and hemiparesis following cerebral infarction affecting left non-dominant side: Secondary | ICD-10-CM | POA: Diagnosis not present

## 2023-09-05 DIAGNOSIS — R531 Weakness: Secondary | ICD-10-CM | POA: Diagnosis not present

## 2023-09-05 DIAGNOSIS — R2689 Other abnormalities of gait and mobility: Secondary | ICD-10-CM | POA: Diagnosis not present

## 2023-09-07 DIAGNOSIS — M25562 Pain in left knee: Secondary | ICD-10-CM | POA: Diagnosis not present

## 2023-09-07 DIAGNOSIS — M25561 Pain in right knee: Secondary | ICD-10-CM | POA: Diagnosis not present

## 2023-09-08 DIAGNOSIS — M79662 Pain in left lower leg: Secondary | ICD-10-CM | POA: Diagnosis not present

## 2023-09-08 DIAGNOSIS — M79661 Pain in right lower leg: Secondary | ICD-10-CM | POA: Diagnosis not present

## 2023-09-13 DIAGNOSIS — I69354 Hemiplegia and hemiparesis following cerebral infarction affecting left non-dominant side: Secondary | ICD-10-CM | POA: Diagnosis not present

## 2023-09-13 DIAGNOSIS — R2689 Other abnormalities of gait and mobility: Secondary | ICD-10-CM | POA: Diagnosis not present

## 2023-09-13 DIAGNOSIS — R531 Weakness: Secondary | ICD-10-CM | POA: Diagnosis not present

## 2023-09-14 DIAGNOSIS — R531 Weakness: Secondary | ICD-10-CM | POA: Diagnosis not present

## 2023-09-14 DIAGNOSIS — R2689 Other abnormalities of gait and mobility: Secondary | ICD-10-CM | POA: Diagnosis not present

## 2023-09-14 DIAGNOSIS — I69354 Hemiplegia and hemiparesis following cerebral infarction affecting left non-dominant side: Secondary | ICD-10-CM | POA: Diagnosis not present

## 2023-09-15 DIAGNOSIS — I69354 Hemiplegia and hemiparesis following cerebral infarction affecting left non-dominant side: Secondary | ICD-10-CM | POA: Diagnosis not present

## 2023-09-15 DIAGNOSIS — R2689 Other abnormalities of gait and mobility: Secondary | ICD-10-CM | POA: Diagnosis not present

## 2023-09-15 DIAGNOSIS — R531 Weakness: Secondary | ICD-10-CM | POA: Diagnosis not present

## 2023-09-18 DIAGNOSIS — R531 Weakness: Secondary | ICD-10-CM | POA: Diagnosis not present

## 2023-09-18 DIAGNOSIS — R2689 Other abnormalities of gait and mobility: Secondary | ICD-10-CM | POA: Diagnosis not present

## 2023-09-18 DIAGNOSIS — I69354 Hemiplegia and hemiparesis following cerebral infarction affecting left non-dominant side: Secondary | ICD-10-CM | POA: Diagnosis not present

## 2023-09-19 DIAGNOSIS — R2689 Other abnormalities of gait and mobility: Secondary | ICD-10-CM | POA: Diagnosis not present

## 2023-09-19 DIAGNOSIS — I69354 Hemiplegia and hemiparesis following cerebral infarction affecting left non-dominant side: Secondary | ICD-10-CM | POA: Diagnosis not present

## 2023-09-19 DIAGNOSIS — R531 Weakness: Secondary | ICD-10-CM | POA: Diagnosis not present

## 2023-09-20 DIAGNOSIS — R2689 Other abnormalities of gait and mobility: Secondary | ICD-10-CM | POA: Diagnosis not present

## 2023-09-20 DIAGNOSIS — R531 Weakness: Secondary | ICD-10-CM | POA: Diagnosis not present

## 2023-09-20 DIAGNOSIS — I69354 Hemiplegia and hemiparesis following cerebral infarction affecting left non-dominant side: Secondary | ICD-10-CM | POA: Diagnosis not present

## 2023-09-21 DIAGNOSIS — R531 Weakness: Secondary | ICD-10-CM | POA: Diagnosis not present

## 2023-09-21 DIAGNOSIS — I69354 Hemiplegia and hemiparesis following cerebral infarction affecting left non-dominant side: Secondary | ICD-10-CM | POA: Diagnosis not present

## 2023-09-21 DIAGNOSIS — R2689 Other abnormalities of gait and mobility: Secondary | ICD-10-CM | POA: Diagnosis not present

## 2023-09-23 DIAGNOSIS — R531 Weakness: Secondary | ICD-10-CM | POA: Diagnosis not present

## 2023-09-23 DIAGNOSIS — I480 Paroxysmal atrial fibrillation: Secondary | ICD-10-CM | POA: Diagnosis not present

## 2023-09-23 DIAGNOSIS — E440 Moderate protein-calorie malnutrition: Secondary | ICD-10-CM | POA: Diagnosis not present

## 2023-09-23 DIAGNOSIS — R2689 Other abnormalities of gait and mobility: Secondary | ICD-10-CM | POA: Diagnosis not present

## 2023-09-23 DIAGNOSIS — I69354 Hemiplegia and hemiparesis following cerebral infarction affecting left non-dominant side: Secondary | ICD-10-CM | POA: Diagnosis not present

## 2023-09-23 DIAGNOSIS — E1165 Type 2 diabetes mellitus with hyperglycemia: Secondary | ICD-10-CM | POA: Diagnosis not present

## 2023-09-23 DIAGNOSIS — G40919 Epilepsy, unspecified, intractable, without status epilepticus: Secondary | ICD-10-CM | POA: Diagnosis not present

## 2023-09-25 DIAGNOSIS — I69354 Hemiplegia and hemiparesis following cerebral infarction affecting left non-dominant side: Secondary | ICD-10-CM | POA: Diagnosis not present

## 2023-09-25 DIAGNOSIS — R2689 Other abnormalities of gait and mobility: Secondary | ICD-10-CM | POA: Diagnosis not present

## 2023-09-25 DIAGNOSIS — R531 Weakness: Secondary | ICD-10-CM | POA: Diagnosis not present

## 2023-09-26 DIAGNOSIS — R2689 Other abnormalities of gait and mobility: Secondary | ICD-10-CM | POA: Diagnosis not present

## 2023-09-26 DIAGNOSIS — R531 Weakness: Secondary | ICD-10-CM | POA: Diagnosis not present

## 2023-09-26 DIAGNOSIS — I69354 Hemiplegia and hemiparesis following cerebral infarction affecting left non-dominant side: Secondary | ICD-10-CM | POA: Diagnosis not present

## 2023-09-27 DIAGNOSIS — R2689 Other abnormalities of gait and mobility: Secondary | ICD-10-CM | POA: Diagnosis not present

## 2023-09-27 DIAGNOSIS — R531 Weakness: Secondary | ICD-10-CM | POA: Diagnosis not present

## 2023-09-27 DIAGNOSIS — I69354 Hemiplegia and hemiparesis following cerebral infarction affecting left non-dominant side: Secondary | ICD-10-CM | POA: Diagnosis not present

## 2023-09-28 DIAGNOSIS — R2689 Other abnormalities of gait and mobility: Secondary | ICD-10-CM | POA: Diagnosis not present

## 2023-09-28 DIAGNOSIS — R531 Weakness: Secondary | ICD-10-CM | POA: Diagnosis not present

## 2023-09-28 DIAGNOSIS — I69354 Hemiplegia and hemiparesis following cerebral infarction affecting left non-dominant side: Secondary | ICD-10-CM | POA: Diagnosis not present

## 2023-09-29 DIAGNOSIS — R2689 Other abnormalities of gait and mobility: Secondary | ICD-10-CM | POA: Diagnosis not present

## 2023-09-29 DIAGNOSIS — I69354 Hemiplegia and hemiparesis following cerebral infarction affecting left non-dominant side: Secondary | ICD-10-CM | POA: Diagnosis not present

## 2023-09-29 DIAGNOSIS — R531 Weakness: Secondary | ICD-10-CM | POA: Diagnosis not present

## 2023-10-11 ENCOUNTER — Inpatient Hospital Stay: Payer: Medicare HMO | Admitting: Neurology

## 2023-10-11 ENCOUNTER — Encounter: Payer: Self-pay | Admitting: Neurology

## 2023-10-21 DIAGNOSIS — D649 Anemia, unspecified: Secondary | ICD-10-CM | POA: Diagnosis not present

## 2023-10-23 ENCOUNTER — Telehealth: Payer: Self-pay | Admitting: Neurology

## 2023-10-23 DIAGNOSIS — R0989 Other specified symptoms and signs involving the circulatory and respiratory systems: Secondary | ICD-10-CM | POA: Diagnosis not present

## 2023-10-23 DIAGNOSIS — R059 Cough, unspecified: Secondary | ICD-10-CM | POA: Diagnosis not present

## 2023-10-23 NOTE — Telephone Encounter (Signed)
Camille with [facility] rescheduling pt's missed appt

## 2023-10-24 DIAGNOSIS — G40909 Epilepsy, unspecified, not intractable, without status epilepticus: Secondary | ICD-10-CM | POA: Diagnosis not present

## 2023-10-24 DIAGNOSIS — R059 Cough, unspecified: Secondary | ICD-10-CM | POA: Diagnosis not present

## 2023-10-24 DIAGNOSIS — I4891 Unspecified atrial fibrillation: Secondary | ICD-10-CM | POA: Diagnosis not present

## 2023-10-24 DIAGNOSIS — I10 Essential (primary) hypertension: Secondary | ICD-10-CM | POA: Diagnosis not present

## 2023-11-07 DIAGNOSIS — M6281 Muscle weakness (generalized): Secondary | ICD-10-CM | POA: Diagnosis not present

## 2023-11-07 DIAGNOSIS — M799 Soft tissue disorder, unspecified: Secondary | ICD-10-CM | POA: Diagnosis not present

## 2023-11-07 DIAGNOSIS — R2681 Unsteadiness on feet: Secondary | ICD-10-CM | POA: Diagnosis not present

## 2023-11-07 DIAGNOSIS — I69354 Hemiplegia and hemiparesis following cerebral infarction affecting left non-dominant side: Secondary | ICD-10-CM | POA: Diagnosis not present

## 2023-11-08 DIAGNOSIS — I69354 Hemiplegia and hemiparesis following cerebral infarction affecting left non-dominant side: Secondary | ICD-10-CM | POA: Diagnosis not present

## 2023-11-08 DIAGNOSIS — R2681 Unsteadiness on feet: Secondary | ICD-10-CM | POA: Diagnosis not present

## 2023-11-08 DIAGNOSIS — M6281 Muscle weakness (generalized): Secondary | ICD-10-CM | POA: Diagnosis not present

## 2023-11-08 DIAGNOSIS — M799 Soft tissue disorder, unspecified: Secondary | ICD-10-CM | POA: Diagnosis not present

## 2023-11-08 DIAGNOSIS — G40909 Epilepsy, unspecified, not intractable, without status epilepticus: Secondary | ICD-10-CM | POA: Diagnosis not present

## 2023-11-09 DIAGNOSIS — M799 Soft tissue disorder, unspecified: Secondary | ICD-10-CM | POA: Diagnosis not present

## 2023-11-09 DIAGNOSIS — M6281 Muscle weakness (generalized): Secondary | ICD-10-CM | POA: Diagnosis not present

## 2023-11-09 DIAGNOSIS — R2681 Unsteadiness on feet: Secondary | ICD-10-CM | POA: Diagnosis not present

## 2023-11-09 DIAGNOSIS — I69354 Hemiplegia and hemiparesis following cerebral infarction affecting left non-dominant side: Secondary | ICD-10-CM | POA: Diagnosis not present

## 2023-11-10 DIAGNOSIS — M799 Soft tissue disorder, unspecified: Secondary | ICD-10-CM | POA: Diagnosis not present

## 2023-11-10 DIAGNOSIS — M6281 Muscle weakness (generalized): Secondary | ICD-10-CM | POA: Diagnosis not present

## 2023-11-10 DIAGNOSIS — I69354 Hemiplegia and hemiparesis following cerebral infarction affecting left non-dominant side: Secondary | ICD-10-CM | POA: Diagnosis not present

## 2023-11-10 DIAGNOSIS — R2681 Unsteadiness on feet: Secondary | ICD-10-CM | POA: Diagnosis not present

## 2023-11-12 DIAGNOSIS — I69354 Hemiplegia and hemiparesis following cerebral infarction affecting left non-dominant side: Secondary | ICD-10-CM | POA: Diagnosis not present

## 2023-11-12 DIAGNOSIS — M6281 Muscle weakness (generalized): Secondary | ICD-10-CM | POA: Diagnosis not present

## 2023-11-12 DIAGNOSIS — M799 Soft tissue disorder, unspecified: Secondary | ICD-10-CM | POA: Diagnosis not present

## 2023-11-12 DIAGNOSIS — R2681 Unsteadiness on feet: Secondary | ICD-10-CM | POA: Diagnosis not present

## 2023-11-13 DIAGNOSIS — I69354 Hemiplegia and hemiparesis following cerebral infarction affecting left non-dominant side: Secondary | ICD-10-CM | POA: Diagnosis not present

## 2023-11-13 DIAGNOSIS — R2681 Unsteadiness on feet: Secondary | ICD-10-CM | POA: Diagnosis not present

## 2023-11-13 DIAGNOSIS — M799 Soft tissue disorder, unspecified: Secondary | ICD-10-CM | POA: Diagnosis not present

## 2023-11-13 DIAGNOSIS — M6281 Muscle weakness (generalized): Secondary | ICD-10-CM | POA: Diagnosis not present

## 2023-11-14 DIAGNOSIS — R2681 Unsteadiness on feet: Secondary | ICD-10-CM | POA: Diagnosis not present

## 2023-11-14 DIAGNOSIS — M6281 Muscle weakness (generalized): Secondary | ICD-10-CM | POA: Diagnosis not present

## 2023-11-14 DIAGNOSIS — Z7984 Long term (current) use of oral hypoglycemic drugs: Secondary | ICD-10-CM | POA: Diagnosis not present

## 2023-11-14 DIAGNOSIS — I69354 Hemiplegia and hemiparesis following cerebral infarction affecting left non-dominant side: Secondary | ICD-10-CM | POA: Diagnosis not present

## 2023-11-14 DIAGNOSIS — E1151 Type 2 diabetes mellitus with diabetic peripheral angiopathy without gangrene: Secondary | ICD-10-CM | POA: Diagnosis not present

## 2023-11-14 DIAGNOSIS — M799 Soft tissue disorder, unspecified: Secondary | ICD-10-CM | POA: Diagnosis not present

## 2023-11-14 DIAGNOSIS — L602 Onychogryphosis: Secondary | ICD-10-CM | POA: Diagnosis not present

## 2023-11-14 DIAGNOSIS — L603 Nail dystrophy: Secondary | ICD-10-CM | POA: Diagnosis not present

## 2023-11-15 DIAGNOSIS — M799 Soft tissue disorder, unspecified: Secondary | ICD-10-CM | POA: Diagnosis not present

## 2023-11-15 DIAGNOSIS — I69354 Hemiplegia and hemiparesis following cerebral infarction affecting left non-dominant side: Secondary | ICD-10-CM | POA: Diagnosis not present

## 2023-11-15 DIAGNOSIS — M6281 Muscle weakness (generalized): Secondary | ICD-10-CM | POA: Diagnosis not present

## 2023-11-15 DIAGNOSIS — R2681 Unsteadiness on feet: Secondary | ICD-10-CM | POA: Diagnosis not present

## 2023-11-16 DIAGNOSIS — I69354 Hemiplegia and hemiparesis following cerebral infarction affecting left non-dominant side: Secondary | ICD-10-CM | POA: Diagnosis not present

## 2023-11-16 DIAGNOSIS — R2681 Unsteadiness on feet: Secondary | ICD-10-CM | POA: Diagnosis not present

## 2023-11-16 DIAGNOSIS — M799 Soft tissue disorder, unspecified: Secondary | ICD-10-CM | POA: Diagnosis not present

## 2023-11-16 DIAGNOSIS — M6281 Muscle weakness (generalized): Secondary | ICD-10-CM | POA: Diagnosis not present

## 2023-11-17 DIAGNOSIS — G8929 Other chronic pain: Secondary | ICD-10-CM | POA: Diagnosis not present

## 2023-11-17 DIAGNOSIS — R2681 Unsteadiness on feet: Secondary | ICD-10-CM | POA: Diagnosis not present

## 2023-11-17 DIAGNOSIS — M6281 Muscle weakness (generalized): Secondary | ICD-10-CM | POA: Diagnosis not present

## 2023-11-17 DIAGNOSIS — I69354 Hemiplegia and hemiparesis following cerebral infarction affecting left non-dominant side: Secondary | ICD-10-CM | POA: Diagnosis not present

## 2023-11-17 DIAGNOSIS — M799 Soft tissue disorder, unspecified: Secondary | ICD-10-CM | POA: Diagnosis not present

## 2023-11-18 DIAGNOSIS — R2681 Unsteadiness on feet: Secondary | ICD-10-CM | POA: Diagnosis not present

## 2023-11-18 DIAGNOSIS — M799 Soft tissue disorder, unspecified: Secondary | ICD-10-CM | POA: Diagnosis not present

## 2023-11-18 DIAGNOSIS — M6281 Muscle weakness (generalized): Secondary | ICD-10-CM | POA: Diagnosis not present

## 2023-11-18 DIAGNOSIS — I69354 Hemiplegia and hemiparesis following cerebral infarction affecting left non-dominant side: Secondary | ICD-10-CM | POA: Diagnosis not present

## 2023-11-20 DIAGNOSIS — R2681 Unsteadiness on feet: Secondary | ICD-10-CM | POA: Diagnosis not present

## 2023-11-20 DIAGNOSIS — M6281 Muscle weakness (generalized): Secondary | ICD-10-CM | POA: Diagnosis not present

## 2023-11-20 DIAGNOSIS — I69354 Hemiplegia and hemiparesis following cerebral infarction affecting left non-dominant side: Secondary | ICD-10-CM | POA: Diagnosis not present

## 2023-11-20 DIAGNOSIS — M799 Soft tissue disorder, unspecified: Secondary | ICD-10-CM | POA: Diagnosis not present

## 2023-11-21 DIAGNOSIS — M6281 Muscle weakness (generalized): Secondary | ICD-10-CM | POA: Diagnosis not present

## 2023-11-21 DIAGNOSIS — M799 Soft tissue disorder, unspecified: Secondary | ICD-10-CM | POA: Diagnosis not present

## 2023-11-21 DIAGNOSIS — R2681 Unsteadiness on feet: Secondary | ICD-10-CM | POA: Diagnosis not present

## 2023-11-21 DIAGNOSIS — I69354 Hemiplegia and hemiparesis following cerebral infarction affecting left non-dominant side: Secondary | ICD-10-CM | POA: Diagnosis not present

## 2023-11-22 DIAGNOSIS — M6281 Muscle weakness (generalized): Secondary | ICD-10-CM | POA: Diagnosis not present

## 2023-11-22 DIAGNOSIS — M799 Soft tissue disorder, unspecified: Secondary | ICD-10-CM | POA: Diagnosis not present

## 2023-11-22 DIAGNOSIS — R2681 Unsteadiness on feet: Secondary | ICD-10-CM | POA: Diagnosis not present

## 2023-11-22 DIAGNOSIS — I69354 Hemiplegia and hemiparesis following cerebral infarction affecting left non-dominant side: Secondary | ICD-10-CM | POA: Diagnosis not present

## 2023-11-24 DIAGNOSIS — I69354 Hemiplegia and hemiparesis following cerebral infarction affecting left non-dominant side: Secondary | ICD-10-CM | POA: Diagnosis not present

## 2023-11-24 DIAGNOSIS — R2681 Unsteadiness on feet: Secondary | ICD-10-CM | POA: Diagnosis not present

## 2023-11-24 DIAGNOSIS — M799 Soft tissue disorder, unspecified: Secondary | ICD-10-CM | POA: Diagnosis not present

## 2023-11-24 DIAGNOSIS — M6281 Muscle weakness (generalized): Secondary | ICD-10-CM | POA: Diagnosis not present

## 2023-11-25 DIAGNOSIS — M799 Soft tissue disorder, unspecified: Secondary | ICD-10-CM | POA: Diagnosis not present

## 2023-11-25 DIAGNOSIS — M6281 Muscle weakness (generalized): Secondary | ICD-10-CM | POA: Diagnosis not present

## 2023-11-25 DIAGNOSIS — I69354 Hemiplegia and hemiparesis following cerebral infarction affecting left non-dominant side: Secondary | ICD-10-CM | POA: Diagnosis not present

## 2023-11-25 DIAGNOSIS — R2681 Unsteadiness on feet: Secondary | ICD-10-CM | POA: Diagnosis not present

## 2023-11-27 DIAGNOSIS — I69354 Hemiplegia and hemiparesis following cerebral infarction affecting left non-dominant side: Secondary | ICD-10-CM | POA: Diagnosis not present

## 2023-11-27 DIAGNOSIS — M6281 Muscle weakness (generalized): Secondary | ICD-10-CM | POA: Diagnosis not present

## 2023-11-27 DIAGNOSIS — R2681 Unsteadiness on feet: Secondary | ICD-10-CM | POA: Diagnosis not present

## 2023-11-27 DIAGNOSIS — M799 Soft tissue disorder, unspecified: Secondary | ICD-10-CM | POA: Diagnosis not present

## 2023-12-07 DIAGNOSIS — G40909 Epilepsy, unspecified, not intractable, without status epilepticus: Secondary | ICD-10-CM | POA: Diagnosis not present

## 2023-12-07 DIAGNOSIS — I4891 Unspecified atrial fibrillation: Secondary | ICD-10-CM | POA: Diagnosis not present

## 2023-12-07 DIAGNOSIS — G8929 Other chronic pain: Secondary | ICD-10-CM | POA: Diagnosis not present

## 2023-12-21 ENCOUNTER — Telehealth: Payer: Self-pay | Admitting: *Deleted

## 2023-12-21 NOTE — Telephone Encounter (Signed)
 Patient was identified as falling into the [place] Measure - Diabetes.   Patient was: Appointment scheduled with primary care provider in the next 30 days.

## 2023-12-25 DIAGNOSIS — I4891 Unspecified atrial fibrillation: Secondary | ICD-10-CM | POA: Diagnosis not present

## 2023-12-25 DIAGNOSIS — G8929 Other chronic pain: Secondary | ICD-10-CM | POA: Diagnosis not present

## 2023-12-25 DIAGNOSIS — E119 Type 2 diabetes mellitus without complications: Secondary | ICD-10-CM | POA: Diagnosis not present

## 2023-12-25 DIAGNOSIS — G40909 Epilepsy, unspecified, not intractable, without status epilepticus: Secondary | ICD-10-CM | POA: Diagnosis not present

## 2024-01-04 ENCOUNTER — Encounter: Payer: Self-pay | Admitting: Neurology

## 2024-01-04 ENCOUNTER — Ambulatory Visit: Payer: Medicare HMO | Admitting: Neurology

## 2024-01-04 VITALS — BP 130/75 | HR 70 | Ht 62.0 in | Wt 246.0 lb

## 2024-01-04 DIAGNOSIS — R569 Unspecified convulsions: Secondary | ICD-10-CM | POA: Diagnosis not present

## 2024-01-04 DIAGNOSIS — I4891 Unspecified atrial fibrillation: Secondary | ICD-10-CM

## 2024-01-04 DIAGNOSIS — G40909 Epilepsy, unspecified, not intractable, without status epilepticus: Secondary | ICD-10-CM | POA: Diagnosis not present

## 2024-01-04 DIAGNOSIS — G8110 Spastic hemiplegia affecting unspecified side: Secondary | ICD-10-CM | POA: Diagnosis not present

## 2024-01-04 DIAGNOSIS — I6310 Cerebral infarction due to embolism of unspecified precerebral artery: Secondary | ICD-10-CM | POA: Diagnosis not present

## 2024-01-04 DIAGNOSIS — I10 Essential (primary) hypertension: Secondary | ICD-10-CM | POA: Diagnosis not present

## 2024-01-04 NOTE — Progress Notes (Signed)
 [facility] [address] [place]. [place] [address] [phone]       OFFICE FOLLOW-UP NOTE  [name] Date of Birth:  1946/09/06 Medical Record Number:  [id number]   HPI: [name] is a 78 year old African-American lady seen today for initial office follow-up visit following hospital admission for stroke in May 2024.  She is accompanied by her son and history is obtained from them and review of electronic medical records and I personally reviewed pertinent available imaging films in PACS.  She has past medical history of diabetes, hypertension, morbid obesity, cardiomyopathy, depression, TIA and arthritis.  She presented on 03/25/2023 with a fall when she tried to get out of bed and head hit the dresser.  She was in bed all day and was hard to establish last known well.  She was found to have left hemiparesis and NIH stroke scale of 4 on admission.  CT head showed patchy hemorrhage in the right parietal lobe suggestive of hemorrhagic infarct or contusion.  CT angiogram of the head and neck showed no emergent large vessel occlusion.  MRI scan of the brain confirmed moderate size right MCA and small ACA infarcts.  2D echo showed ejection fraction of 50 to 55%.  LDL cholesterol 54 mg percent and hemoglobin A1c was elevated at 9.8.  Urine drug screen was negative.  Patient was on aspirin prior to admission but 20 platelet therapy was held initially due to intracerebral hemorrhage but given history of A-fib she was eventually started several weeks later on Eliquis.  She did have focal seizures on 03/26/2024 with left arm rhythmic twitching's involving left eyelid and spreading to the right side.  She was able to speak and communicate during these episodes.  Long-term EEG monitoring for 24 hours showed 23 focal seizures arising from the right central parietal region lasting 1 to 5 minutes.  She was started on Depakote and subsequent EEG seizure frequency declined to 6  seizures or 24 hours the next day and 2 days later she had no seizures and monitoring.  She had refractory seizures and was started initially on Keppra and then Vimpat and then valproic acid.  .  Patient transferred for rehabilitation needs to skilled nursing facility where she is presently still residing.  She is still has left hemiparesis and is wheelchair-bound.  She is working with physical and Occupational Therapy but is yet unable to stand or walk.  She was readmitted on 06/10/2023 to 06/16/2023 for episode of hypoxic respiratory failure which she is gradually recovering. She had a follow-up CT head on 06/11/2023 which showed encephalomalacia in the right frontoparietal region without any acute abnormality.  Patient's son states he has not noticed any significant seizures since she is left the hospital.  She has had some mild short-term memory and cognitive decline since her stroke and seizures but it is not progressive. ROS:   14 system review of systems is positive for weakness, imbalance, walking difficulty, vision loss, memory loss, hand and leg swelling and all other systems negative  PMH:  Past Medical History:  Diagnosis Date   Anxiety    Cardiomyopathy    Degenerative joint disease    Depression    Diabetes mellitus    NIDDM   Hypertension    Morbid obesity with BMI of 45.0-49.9, adult (HCC)    TIA (transient ischemic attack)     Social History:  Social History   Socioeconomic History   Marital status: Divorced    Spouse name:  Not on file   Number of children: Not on file   Years of education: Not on file   Highest education level: Not on file  Occupational History   Occupation: House keeping-retired since 2006    Employer: RETIRED  Tobacco Use   Smoking status: Former    Types: Cigarettes   Smokeless tobacco: Never   Tobacco comments:    Quit x 2 yrrs.  Substance and Sexual Activity   Alcohol use: Yes    Comment: vodka with water on occasions    Drug use: No     Comment: THC occasionally.   Sexual activity: Not on file  Other Topics Concern   Not on file  Social History Narrative   Lives in [place].   Social Drivers of Corporate investment banker Strain: Not on file  Food Insecurity: No Food Insecurity (06/10/2023)   Hunger Vital Sign    Worried About Running Out of Food in the Last Year: Never true    Ran Out of Food in the Last Year: Never true  Transportation Needs: No Transportation Needs (06/10/2023)   PRAPARE - Administrator, Civil Service (Medical): No    Lack of Transportation (Non-Medical): No  Physical Activity: Not on file  Stress: Not on file  Social Connections: Not on file  Intimate Partner Violence: Not At Risk (06/10/2023)   Humiliation, Afraid, Rape, and Kick questionnaire    Fear of Current or Ex-Partner: No    Emotionally Abused: No    Physically Abused: No    Sexually Abused: No    Medications:   Current Outpatient Medications on File Prior to Visit  Medication Sig Dispense Refill   Accu-Chek FastClix Lancets MISC Check blood sugar 1 time a day 102 each 4   Accu-Chek Softclix Lancets lancets Use as directed up to 4 times daily. 100 each 0   Acetaminophen 1000 MG/100ML SOLN Take 1,000 mg by mouth every 12 (twelve) hours.     albuterol (VENTOLIN HFA) 108 (90 Base) MCG/ACT inhaler Inhale 1-2 puffs into the lungs every 4 (four) hours as needed.     amLODipine (NORVASC) 10 MG tablet Take 1 tablet (10 mg total) by mouth daily. 30 tablet 0   apixaban (ELIQUIS) 5 MG TABS tablet Take 1 tablet (5 mg total) by mouth 2 (two) times daily. 60 tablet 0   benzonatate (TESSALON) 100 MG capsule Take 100 mg by mouth 3 (three) times daily as needed for cough.     blood glucose meter kit and supplies KIT Use up to four times daily as directed. 1 each 0   Blood Glucose Monitoring Suppl (ACCU-CHEK GUIDE) w/Device KIT 1 each by Does not apply route daily. Check blood sugar 1 time a day 1 kit 1   feeding supplement (ENSURE  ENLIVE / ENSURE PLUS) LIQD Take 237 mLs by mouth 2 (two) times daily between meals. 237 mL 12   Fluticasone-Umeclidin-Vilant (TRELEGY ELLIPTA) 100-62.5-25 MCG/ACT AEPB Inhale 1 puff into the lungs daily. 60 each 1   glucose blood (ACCU-CHEK GUIDE) test strip Check blood sugar 1 time per day 100 each 4   glucose blood test strip Use as directed up to four times daily 100 each 0   insulin glargine-yfgn (SEMGLEE) 100 UNIT/ML injection Inject 0.1 mLs (10 Units total) into the skin daily. (Patient taking differently: Inject 13 Units into the skin at bedtime.) 10 mL 11   Insulin Pen Needle 31G X 8 MM MISC Use 4 (four) times daily  with insulin pens. 100 each 1   lacosamide (VIMPAT) 200 MG TABS tablet Take 1 tablet (200 mg total) by mouth 2 (two) times daily. 60 tablet 0   levETIRAcetam (KEPPRA) 750 MG tablet Take 1,500 mg by mouth 2 (two) times daily.     losartan (COZAAR) 100 MG tablet Take 1 tablet (100 mg total) by mouth daily. 30 tablet 0   metFORMIN (GLUCOPHAGE) 500 MG tablet Take 1 tablet (500 mg total) by mouth 2 (two) times daily with a meal. 180 tablet 1   metoprolol succinate (TOPROL-XL) 100 MG 24 hr tablet Take 1 tablet (100 mg total) by mouth daily. Take with or immediately following a meal. 30 tablet 0   Multiple Vitamin (MULTIVITAMIN WITH MINERALS) TABS tablet Take 1 tablet by mouth daily. 90 tablet 3   pravastatin (PRAVACHOL) 20 MG tablet TAKE 1 TABLET AT BEDTIME (Patient taking differently: Take 20 mg by mouth daily.) 90 tablet 1   QUEtiapine (SEROQUEL) 50 MG tablet Take 1 tablet (50 mg total) by mouth daily. 30 tablet 0   sertraline (ZOLOFT) 100 MG tablet Take 1 tablet (100 mg total) by mouth daily. 90 tablet 1   TIADYLT ER 420 MG 24 hr capsule TAKE 1 CAPSULE EVERY DAY (Patient taking differently: Take 420 mg by mouth daily.) 90 capsule 1   valproic acid (DEPAKENE) 250 MG/5ML solution Take 10 mLs (500 mg total) by mouth every 8 (eight) hours. 300 mL 0   pantoprazole (PROTONIX) 40 MG tablet  Take 1 tablet (40 mg total) by mouth daily.     No current facility-administered medications on file prior to visit.    Allergies:   Allergies  Allergen Reactions   Ace Inhibitors Other (See Comments)     cough   Penicillins Other (See Comments)    Muscle spasms, Has patient had a PCN reaction causing immediate rash, facial/tongue/throat swelling, SOB or lightheadedness with hypotension: Yes Has patient had a PCN reaction causing severe rash involving mucus membranes or skin necrosis: No Has patient had a PCN reaction that required hospitalization No Has patient had a PCN reaction occurring within the last 10 years: Yes If all of the above answers are "NO", then may proceed with Cephalosporin use.     Physical Exam General: Obese elderly African-American lady, seated, in no evident distress Head: head normocephalic and atraumatic.  Neck: supple with no carotid or supraclavicular bruits Cardiovascular: regular rate and rhythm, no murmurs Musculoskeletal: no deformity Skin:  no rash/petichiae Vascular:  Normal pulses all extremities 1+ pedal edema bilaterally. Vitals:   01/04/24 1011  BP: 130/75  Pulse: 70   Neurologic Exam Mental Status: Awake and fully alert. Oriented to place and time. Recent and remote memory intact. Attention span, concentration and fund of knowledge appropriate. Mood and affect appropriate.  Cranial Nerves: Fundoscopic exam reveals not done. Pupils equal, briskly reactive to light. Extraocular movements full without nystagmus. Visual fields show dense left homonymous hemianopsia to confrontation. Hearing intact. Facial sensation intact.  Moderate left lower facial weakness.  Tongue, palate moves normally and symmetrically.  Motor: Spastic left hemiparesis with 3/5 left upper extremity strength with significant left grip and hand weakness.  2/5 left lower extremity strength.  Spasticity on the left with increased tone.  Normal strength on the right. Sensory.:  i diminished left hemibody sensation to 2 touch ,pinprick .position and vibration coordination: Normal on the right and impaired on the left.   Gait and Station: Deferred as patient is wheelchair-bound at baseline and  unable to stand and walk  reflexes: 1+ and symmetric. Toes downgoing.   NIHSS  9 Modified Rankin  4   ASSESSMENT: 78 year old African-American lady with right hemispheric infarcts in May 2024 secondary to new onset A-fib with post stroke partial seizures.  She still has significant spastic left hemiparesis and hemianopsia and remains wheelchair-bound.  She is doing well with the seizure standpoint without recurrent focal seizures but is on high doses of 3 different anticonvulsants.     PLAN:I had a long discussion with the patient and her son regarding her recent embolic stroke from new onset A-fib and residual spastic left hemiparesis and seizures and answered questions.  She has done well without any recurrent seizures since discharge hence I recommend we reduce Keppra dose to 750 mg twice daily for a month and then 750 mg once a day for a month and discontinue it.  Continue Vimpat and Depakene in the current dosages for now and may consider tapering this as well in the future as tolerated.  Continue Eliquis for stroke prevention for A-fib and maintain aggressive risk factor modification with strict control of hypertension and blood pressure goal below 130/90, lipids with LDL cholesterol goal below 70 mg percent and diabetes with hemoglobin A1c goal below 6.5%.  Continue ongoing physical and Occupational Therapy.  Return for follow-up in the future in 3 months with my nurse practitioner or call earlier if necessary.  Greater than 50% of time during this 40 minute visit was spent on counseling,explanation of diagnosis, planning of further management, discussion with patient and family and coordination of care Delia Heady, MD Note: This document was prepared with digital dictation and  possible smart phrase technology. Any transcriptional errors that result from this process are unintentional

## 2024-01-04 NOTE — Patient Instructions (Signed)
 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  She has done well without any recurrent seizures since discharge hence I recommend we reduce Keppra dose to 750 mg twice daily for a month and then 750 mg once a day for a month and discontinue it.  Continue Vimpat and Depakene in the current dosages for now and may consider tapering this as well in the future as tolerated.  Continue Eliquis for stroke prevention for A-fib and maintain aggressive risk factor modification with strict control of hypertension and blood pressure goal below 130/90, lipids with LDL cholesterol goal below 70 mg percent and diabetes with hemoglobin A1c goal below 6.5%.  Continue ongoing physical and Occupational Therapy.  Return for follow-up in the future in 3 months with my nurse practitioner or call earlier if necessary.  Stroke Prevention Some medical conditions and behaviors can lead to a higher chance of having a stroke. You can help prevent a stroke by eating healthy, exercising, not smoking, and managing any medical conditions you have. Stroke is a leading cause of functional impairment. Primary prevention is particularly important because a majority of strokes are first-time events. Stroke changes the lives of not only those who experience a stroke but also their family and other caregivers. How can this condition affect me? A stroke is a medical emergency and should be treated right away. A stroke can lead to brain damage and can sometimes be life-threatening. If a person gets medical treatment right away, there is a better chance of surviving and recovering from a stroke. What can increase my risk? The following medical conditions may increase your risk of a stroke: Cardiovascular disease. High blood pressure (hypertension). Diabetes. High cholesterol. Sickle cell disease. Blood  clotting disorders (hypercoagulable state). Obesity. Sleep disorders (obstructive sleep apnea). Other risk factors include: Being older than age 83. Having a history of blood clots, stroke, or mini-stroke (transient ischemic attack, TIA). Genetic factors, such as race, ethnicity, or a family history of stroke. Smoking cigarettes or using other tobacco products. Taking birth control pills, especially if you also use tobacco. Heavy use of alcohol or drugs, especially cocaine and methamphetamine. Physical inactivity. What actions can I take to prevent this? Manage your health conditions High cholesterol levels. Eating a healthy diet is important for preventing high cholesterol. If cholesterol cannot be managed through diet alone, you may need to take medicines. Take any prescribed medicines to control your cholesterol as told by your health care provider. Hypertension. To reduce your risk of stroke, try to keep your blood pressure below 130/80. Eating a healthy diet and exercising regularly are important for controlling blood pressure. If these steps are not enough to manage your blood pressure, you may need to take medicines. Take any prescribed medicines to control hypertension as told by your health care provider. Ask your health care provider if you should monitor your blood pressure at home. Have your blood pressure checked every year, even if your blood pressure is normal. Blood pressure increases with age and some medical conditions. Diabetes. Eating a healthy diet and exercising regularly are important parts of managing your blood sugar (glucose). If your blood sugar cannot be managed through diet and exercise, you may need to take medicines. Take any prescribed medicines to control your diabetes as told by your health care provider. Get evaluated for obstructive sleep apnea. Talk to your health care  provider about getting a sleep evaluation if you snore a lot or have excessive  sleepiness. Make sure that any other medical conditions you have, such as atrial fibrillation or atherosclerosis, are managed. Nutrition Follow instructions from your health care provider about what to eat or drink to help manage your health condition. These instructions may include: Reducing your daily calorie intake. Limiting how much salt (sodium) you use to 1,500 milligrams (mg) each day. Using only healthy fats for cooking, such as olive oil, canola oil, or sunflower oil. Eating healthy foods. You can do this by: Choosing foods that are high in fiber, such as whole grains, and fresh fruits and vegetables. Eating at least 5 servings of fruits and vegetables a day. Try to fill one-half of your plate with fruits and vegetables at each meal. Choosing lean protein foods, such as lean cuts of meat, poultry without skin, fish, tofu, beans, and nuts. Eating low-fat dairy products. Avoiding foods that are high in sodium. This can help lower blood pressure. Avoiding foods that have saturated fat, trans fat, and cholesterol. This can help prevent high cholesterol. Avoiding processed and prepared foods. Counting your daily carbohydrate intake.  Lifestyle If you drink alcohol: Limit how much you have to: 0-1 drink a day for women who are not pregnant. 0-2 drinks a day for men. Know how much alcohol is in your drink. In the [place] one drink equals one 12 oz bottle of beer ( ), one 5 oz glass of wine ( ), or one 1 oz glass of hard liquor (44mL). Do not use any products that contain nicotine or tobacco. These products include cigarettes, chewing tobacco, and vaping devices, such as e-cigarettes. If you need help quitting, ask your health care provider. Avoid secondhand smoke. Do not use drugs. Activity  Try to stay at a healthy weight. Get at least 30 minutes of exercise on most days, such as: Fast walking. Biking. Swimming. Medicines Take over-the-counter and prescription medicines  only as told by your health care provider. Aspirin or blood thinners (antiplatelets or anticoagulants) may be recommended to reduce your risk of forming blood clots that can lead to stroke. Avoid taking birth control pills. Talk to your health care provider about the risks of taking birth control pills if: You are over 16 years old. You smoke. You get very bad headaches. You have had a blood clot. Where to find more information American Stroke Association: www.strokeassociation.org Get help right away if: You or a loved one has any symptoms of a stroke. "BE FAST" is an easy way to remember the main warning signs of a stroke: B - Balance. Signs are dizziness, sudden trouble walking, or loss of balance. E - Eyes. Signs are trouble seeing or a sudden change in vision. F - Face. Signs are sudden weakness or numbness of the face, or the face or eyelid drooping on one side. A - Arms. Signs are weakness or numbness in an arm. This happens suddenly and usually on one side of the body. S - Speech. Signs are sudden trouble speaking, slurred speech, or trouble understanding what people say. T - Time. Time to call emergency services. Write down what time symptoms started. You or a loved one has other signs of a stroke, such as: A sudden, severe headache with no known cause. Nausea or vomiting. Seizure. These symptoms may represent a serious problem that is an emergency. Do not wait to see if the symptoms will go away. Get medical help right away. Call your local  emergency services (911 in the [place] Do not drive yourself to the hospital. Summary You can help to prevent a stroke by eating healthy, exercising, not smoking, limiting alcohol intake, and managing any medical conditions you may have. Do not use any products that contain nicotine or tobacco. These include cigarettes, chewing tobacco, and vaping devices, such as e-cigarettes. If you need help quitting, ask your health care provider. Remember "BE  FAST" for warning signs of a stroke. Get help right away if you or a loved one has any of these signs. This information is not intended to replace advice given to you by your health care provider. Make sure you discuss any questions you have with your health care provider. Document Revised: 09/19/2022 Document Reviewed: 09/19/2022 Elsevier Patient Education  2024 [employer].

## 2024-01-09 ENCOUNTER — Encounter: Payer: Medicare HMO | Admitting: Student

## 2024-01-18 DIAGNOSIS — R1312 Dysphagia, oropharyngeal phase: Secondary | ICD-10-CM | POA: Diagnosis not present

## 2024-01-18 DIAGNOSIS — F419 Anxiety disorder, unspecified: Secondary | ICD-10-CM | POA: Diagnosis not present

## 2024-01-18 DIAGNOSIS — I69354 Hemiplegia and hemiparesis following cerebral infarction affecting left non-dominant side: Secondary | ICD-10-CM | POA: Diagnosis not present

## 2024-01-18 DIAGNOSIS — R41841 Cognitive communication deficit: Secondary | ICD-10-CM | POA: Diagnosis not present

## 2024-01-18 DIAGNOSIS — I69391 Dysphagia following cerebral infarction: Secondary | ICD-10-CM | POA: Diagnosis not present

## 2024-01-19 DIAGNOSIS — F419 Anxiety disorder, unspecified: Secondary | ICD-10-CM | POA: Diagnosis not present

## 2024-01-19 DIAGNOSIS — R1312 Dysphagia, oropharyngeal phase: Secondary | ICD-10-CM | POA: Diagnosis not present

## 2024-01-19 DIAGNOSIS — R41841 Cognitive communication deficit: Secondary | ICD-10-CM | POA: Diagnosis not present

## 2024-01-19 DIAGNOSIS — I69354 Hemiplegia and hemiparesis following cerebral infarction affecting left non-dominant side: Secondary | ICD-10-CM | POA: Diagnosis not present

## 2024-01-19 DIAGNOSIS — M542 Cervicalgia: Secondary | ICD-10-CM | POA: Diagnosis not present

## 2024-01-19 DIAGNOSIS — I69391 Dysphagia following cerebral infarction: Secondary | ICD-10-CM | POA: Diagnosis not present

## 2024-01-22 DIAGNOSIS — R1312 Dysphagia, oropharyngeal phase: Secondary | ICD-10-CM | POA: Diagnosis not present

## 2024-01-22 DIAGNOSIS — I69391 Dysphagia following cerebral infarction: Secondary | ICD-10-CM | POA: Diagnosis not present

## 2024-01-22 DIAGNOSIS — I69354 Hemiplegia and hemiparesis following cerebral infarction affecting left non-dominant side: Secondary | ICD-10-CM | POA: Diagnosis not present

## 2024-01-22 DIAGNOSIS — F419 Anxiety disorder, unspecified: Secondary | ICD-10-CM | POA: Diagnosis not present

## 2024-01-22 DIAGNOSIS — R41841 Cognitive communication deficit: Secondary | ICD-10-CM | POA: Diagnosis not present

## 2024-01-23 DIAGNOSIS — R1312 Dysphagia, oropharyngeal phase: Secondary | ICD-10-CM | POA: Diagnosis not present

## 2024-01-23 DIAGNOSIS — R41841 Cognitive communication deficit: Secondary | ICD-10-CM | POA: Diagnosis not present

## 2024-01-23 DIAGNOSIS — I69354 Hemiplegia and hemiparesis following cerebral infarction affecting left non-dominant side: Secondary | ICD-10-CM | POA: Diagnosis not present

## 2024-01-23 DIAGNOSIS — I69391 Dysphagia following cerebral infarction: Secondary | ICD-10-CM | POA: Diagnosis not present

## 2024-01-23 DIAGNOSIS — R443 Hallucinations, unspecified: Secondary | ICD-10-CM | POA: Diagnosis not present

## 2024-01-23 DIAGNOSIS — M542 Cervicalgia: Secondary | ICD-10-CM | POA: Diagnosis not present

## 2024-01-23 DIAGNOSIS — F419 Anxiety disorder, unspecified: Secondary | ICD-10-CM | POA: Diagnosis not present

## 2024-01-24 DIAGNOSIS — R1312 Dysphagia, oropharyngeal phase: Secondary | ICD-10-CM | POA: Diagnosis not present

## 2024-01-24 DIAGNOSIS — F419 Anxiety disorder, unspecified: Secondary | ICD-10-CM | POA: Diagnosis not present

## 2024-01-24 DIAGNOSIS — I69354 Hemiplegia and hemiparesis following cerebral infarction affecting left non-dominant side: Secondary | ICD-10-CM | POA: Diagnosis not present

## 2024-01-24 DIAGNOSIS — R41841 Cognitive communication deficit: Secondary | ICD-10-CM | POA: Diagnosis not present

## 2024-01-24 DIAGNOSIS — I69391 Dysphagia following cerebral infarction: Secondary | ICD-10-CM | POA: Diagnosis not present

## 2024-01-25 DIAGNOSIS — I69354 Hemiplegia and hemiparesis following cerebral infarction affecting left non-dominant side: Secondary | ICD-10-CM | POA: Diagnosis not present

## 2024-01-25 DIAGNOSIS — R1312 Dysphagia, oropharyngeal phase: Secondary | ICD-10-CM | POA: Diagnosis not present

## 2024-01-25 DIAGNOSIS — F419 Anxiety disorder, unspecified: Secondary | ICD-10-CM | POA: Diagnosis not present

## 2024-01-25 DIAGNOSIS — R41841 Cognitive communication deficit: Secondary | ICD-10-CM | POA: Diagnosis not present

## 2024-01-25 DIAGNOSIS — I69391 Dysphagia following cerebral infarction: Secondary | ICD-10-CM | POA: Diagnosis not present

## 2024-01-26 DIAGNOSIS — R1312 Dysphagia, oropharyngeal phase: Secondary | ICD-10-CM | POA: Diagnosis not present

## 2024-01-26 DIAGNOSIS — I69354 Hemiplegia and hemiparesis following cerebral infarction affecting left non-dominant side: Secondary | ICD-10-CM | POA: Diagnosis not present

## 2024-01-26 DIAGNOSIS — R41841 Cognitive communication deficit: Secondary | ICD-10-CM | POA: Diagnosis not present

## 2024-01-26 DIAGNOSIS — I69391 Dysphagia following cerebral infarction: Secondary | ICD-10-CM | POA: Diagnosis not present

## 2024-01-26 DIAGNOSIS — F419 Anxiety disorder, unspecified: Secondary | ICD-10-CM | POA: Diagnosis not present

## 2024-01-27 DIAGNOSIS — F419 Anxiety disorder, unspecified: Secondary | ICD-10-CM | POA: Diagnosis not present

## 2024-01-27 DIAGNOSIS — R1312 Dysphagia, oropharyngeal phase: Secondary | ICD-10-CM | POA: Diagnosis not present

## 2024-01-27 DIAGNOSIS — I69391 Dysphagia following cerebral infarction: Secondary | ICD-10-CM | POA: Diagnosis not present

## 2024-01-27 DIAGNOSIS — I69354 Hemiplegia and hemiparesis following cerebral infarction affecting left non-dominant side: Secondary | ICD-10-CM | POA: Diagnosis not present

## 2024-01-27 DIAGNOSIS — R41841 Cognitive communication deficit: Secondary | ICD-10-CM | POA: Diagnosis not present

## 2024-01-29 DIAGNOSIS — R1312 Dysphagia, oropharyngeal phase: Secondary | ICD-10-CM | POA: Diagnosis not present

## 2024-01-29 DIAGNOSIS — I69391 Dysphagia following cerebral infarction: Secondary | ICD-10-CM | POA: Diagnosis not present

## 2024-01-29 DIAGNOSIS — R41841 Cognitive communication deficit: Secondary | ICD-10-CM | POA: Diagnosis not present

## 2024-01-29 DIAGNOSIS — I69354 Hemiplegia and hemiparesis following cerebral infarction affecting left non-dominant side: Secondary | ICD-10-CM | POA: Diagnosis not present

## 2024-01-29 DIAGNOSIS — F419 Anxiety disorder, unspecified: Secondary | ICD-10-CM | POA: Diagnosis not present

## 2024-01-30 DIAGNOSIS — I69391 Dysphagia following cerebral infarction: Secondary | ICD-10-CM | POA: Diagnosis not present

## 2024-01-30 DIAGNOSIS — M6281 Muscle weakness (generalized): Secondary | ICD-10-CM | POA: Diagnosis not present

## 2024-01-30 DIAGNOSIS — F419 Anxiety disorder, unspecified: Secondary | ICD-10-CM | POA: Diagnosis not present

## 2024-01-30 DIAGNOSIS — R1312 Dysphagia, oropharyngeal phase: Secondary | ICD-10-CM | POA: Diagnosis not present

## 2024-01-30 DIAGNOSIS — R41841 Cognitive communication deficit: Secondary | ICD-10-CM | POA: Diagnosis not present

## 2024-01-30 DIAGNOSIS — I69354 Hemiplegia and hemiparesis following cerebral infarction affecting left non-dominant side: Secondary | ICD-10-CM | POA: Diagnosis not present

## 2024-01-31 DIAGNOSIS — M6281 Muscle weakness (generalized): Secondary | ICD-10-CM | POA: Diagnosis not present

## 2024-01-31 DIAGNOSIS — R41841 Cognitive communication deficit: Secondary | ICD-10-CM | POA: Diagnosis not present

## 2024-01-31 DIAGNOSIS — I69391 Dysphagia following cerebral infarction: Secondary | ICD-10-CM | POA: Diagnosis not present

## 2024-01-31 DIAGNOSIS — I69354 Hemiplegia and hemiparesis following cerebral infarction affecting left non-dominant side: Secondary | ICD-10-CM | POA: Diagnosis not present

## 2024-01-31 DIAGNOSIS — F419 Anxiety disorder, unspecified: Secondary | ICD-10-CM | POA: Diagnosis not present

## 2024-01-31 DIAGNOSIS — R1312 Dysphagia, oropharyngeal phase: Secondary | ICD-10-CM | POA: Diagnosis not present

## 2024-02-01 DIAGNOSIS — F419 Anxiety disorder, unspecified: Secondary | ICD-10-CM | POA: Diagnosis not present

## 2024-02-01 DIAGNOSIS — R41841 Cognitive communication deficit: Secondary | ICD-10-CM | POA: Diagnosis not present

## 2024-02-01 DIAGNOSIS — I69354 Hemiplegia and hemiparesis following cerebral infarction affecting left non-dominant side: Secondary | ICD-10-CM | POA: Diagnosis not present

## 2024-02-01 DIAGNOSIS — M6281 Muscle weakness (generalized): Secondary | ICD-10-CM | POA: Diagnosis not present

## 2024-02-01 DIAGNOSIS — I69391 Dysphagia following cerebral infarction: Secondary | ICD-10-CM | POA: Diagnosis not present

## 2024-02-01 DIAGNOSIS — R1312 Dysphagia, oropharyngeal phase: Secondary | ICD-10-CM | POA: Diagnosis not present

## 2024-02-02 DIAGNOSIS — I69354 Hemiplegia and hemiparesis following cerebral infarction affecting left non-dominant side: Secondary | ICD-10-CM | POA: Diagnosis not present

## 2024-02-02 DIAGNOSIS — F419 Anxiety disorder, unspecified: Secondary | ICD-10-CM | POA: Diagnosis not present

## 2024-02-02 DIAGNOSIS — R41841 Cognitive communication deficit: Secondary | ICD-10-CM | POA: Diagnosis not present

## 2024-02-02 DIAGNOSIS — M6281 Muscle weakness (generalized): Secondary | ICD-10-CM | POA: Diagnosis not present

## 2024-02-02 DIAGNOSIS — R1312 Dysphagia, oropharyngeal phase: Secondary | ICD-10-CM | POA: Diagnosis not present

## 2024-02-02 DIAGNOSIS — I69391 Dysphagia following cerebral infarction: Secondary | ICD-10-CM | POA: Diagnosis not present

## 2024-02-04 DIAGNOSIS — M6281 Muscle weakness (generalized): Secondary | ICD-10-CM | POA: Diagnosis not present

## 2024-02-04 DIAGNOSIS — I69354 Hemiplegia and hemiparesis following cerebral infarction affecting left non-dominant side: Secondary | ICD-10-CM | POA: Diagnosis not present

## 2024-02-04 DIAGNOSIS — R1312 Dysphagia, oropharyngeal phase: Secondary | ICD-10-CM | POA: Diagnosis not present

## 2024-02-04 DIAGNOSIS — F419 Anxiety disorder, unspecified: Secondary | ICD-10-CM | POA: Diagnosis not present

## 2024-02-04 DIAGNOSIS — R41841 Cognitive communication deficit: Secondary | ICD-10-CM | POA: Diagnosis not present

## 2024-02-04 DIAGNOSIS — I69391 Dysphagia following cerebral infarction: Secondary | ICD-10-CM | POA: Diagnosis not present

## 2024-02-05 DIAGNOSIS — R41841 Cognitive communication deficit: Secondary | ICD-10-CM | POA: Diagnosis not present

## 2024-02-05 DIAGNOSIS — I69354 Hemiplegia and hemiparesis following cerebral infarction affecting left non-dominant side: Secondary | ICD-10-CM | POA: Diagnosis not present

## 2024-02-05 DIAGNOSIS — R1312 Dysphagia, oropharyngeal phase: Secondary | ICD-10-CM | POA: Diagnosis not present

## 2024-02-05 DIAGNOSIS — M6281 Muscle weakness (generalized): Secondary | ICD-10-CM | POA: Diagnosis not present

## 2024-02-05 DIAGNOSIS — F419 Anxiety disorder, unspecified: Secondary | ICD-10-CM | POA: Diagnosis not present

## 2024-02-05 DIAGNOSIS — I69391 Dysphagia following cerebral infarction: Secondary | ICD-10-CM | POA: Diagnosis not present

## 2024-02-06 DIAGNOSIS — I69354 Hemiplegia and hemiparesis following cerebral infarction affecting left non-dominant side: Secondary | ICD-10-CM | POA: Diagnosis not present

## 2024-02-06 DIAGNOSIS — R41841 Cognitive communication deficit: Secondary | ICD-10-CM | POA: Diagnosis not present

## 2024-02-06 DIAGNOSIS — F419 Anxiety disorder, unspecified: Secondary | ICD-10-CM | POA: Diagnosis not present

## 2024-02-06 DIAGNOSIS — I69391 Dysphagia following cerebral infarction: Secondary | ICD-10-CM | POA: Diagnosis not present

## 2024-02-06 DIAGNOSIS — M6281 Muscle weakness (generalized): Secondary | ICD-10-CM | POA: Diagnosis not present

## 2024-02-06 DIAGNOSIS — R1312 Dysphagia, oropharyngeal phase: Secondary | ICD-10-CM | POA: Diagnosis not present

## 2024-02-07 DIAGNOSIS — R41841 Cognitive communication deficit: Secondary | ICD-10-CM | POA: Diagnosis not present

## 2024-02-07 DIAGNOSIS — R1312 Dysphagia, oropharyngeal phase: Secondary | ICD-10-CM | POA: Diagnosis not present

## 2024-02-07 DIAGNOSIS — I69391 Dysphagia following cerebral infarction: Secondary | ICD-10-CM | POA: Diagnosis not present

## 2024-02-07 DIAGNOSIS — M6281 Muscle weakness (generalized): Secondary | ICD-10-CM | POA: Diagnosis not present

## 2024-02-07 DIAGNOSIS — I69354 Hemiplegia and hemiparesis following cerebral infarction affecting left non-dominant side: Secondary | ICD-10-CM | POA: Diagnosis not present

## 2024-02-07 DIAGNOSIS — F419 Anxiety disorder, unspecified: Secondary | ICD-10-CM | POA: Diagnosis not present

## 2024-02-08 DIAGNOSIS — M6281 Muscle weakness (generalized): Secondary | ICD-10-CM | POA: Diagnosis not present

## 2024-02-08 DIAGNOSIS — F419 Anxiety disorder, unspecified: Secondary | ICD-10-CM | POA: Diagnosis not present

## 2024-02-08 DIAGNOSIS — R1312 Dysphagia, oropharyngeal phase: Secondary | ICD-10-CM | POA: Diagnosis not present

## 2024-02-08 DIAGNOSIS — R41841 Cognitive communication deficit: Secondary | ICD-10-CM | POA: Diagnosis not present

## 2024-02-08 DIAGNOSIS — I69354 Hemiplegia and hemiparesis following cerebral infarction affecting left non-dominant side: Secondary | ICD-10-CM | POA: Diagnosis not present

## 2024-02-08 DIAGNOSIS — I69391 Dysphagia following cerebral infarction: Secondary | ICD-10-CM | POA: Diagnosis not present

## 2024-02-09 ENCOUNTER — Encounter: Admitting: Student

## 2024-02-09 DIAGNOSIS — I69391 Dysphagia following cerebral infarction: Secondary | ICD-10-CM | POA: Diagnosis not present

## 2024-02-09 DIAGNOSIS — M6281 Muscle weakness (generalized): Secondary | ICD-10-CM | POA: Diagnosis not present

## 2024-02-09 DIAGNOSIS — R41841 Cognitive communication deficit: Secondary | ICD-10-CM | POA: Diagnosis not present

## 2024-02-09 DIAGNOSIS — R1312 Dysphagia, oropharyngeal phase: Secondary | ICD-10-CM | POA: Diagnosis not present

## 2024-02-09 DIAGNOSIS — I69354 Hemiplegia and hemiparesis following cerebral infarction affecting left non-dominant side: Secondary | ICD-10-CM | POA: Diagnosis not present

## 2024-02-09 DIAGNOSIS — F419 Anxiety disorder, unspecified: Secondary | ICD-10-CM | POA: Diagnosis not present

## 2024-02-10 DIAGNOSIS — R41841 Cognitive communication deficit: Secondary | ICD-10-CM | POA: Diagnosis not present

## 2024-02-10 DIAGNOSIS — I69354 Hemiplegia and hemiparesis following cerebral infarction affecting left non-dominant side: Secondary | ICD-10-CM | POA: Diagnosis not present

## 2024-02-10 DIAGNOSIS — F419 Anxiety disorder, unspecified: Secondary | ICD-10-CM | POA: Diagnosis not present

## 2024-02-10 DIAGNOSIS — M6281 Muscle weakness (generalized): Secondary | ICD-10-CM | POA: Diagnosis not present

## 2024-02-10 DIAGNOSIS — I69391 Dysphagia following cerebral infarction: Secondary | ICD-10-CM | POA: Diagnosis not present

## 2024-02-10 DIAGNOSIS — R1312 Dysphagia, oropharyngeal phase: Secondary | ICD-10-CM | POA: Diagnosis not present

## 2024-02-11 DIAGNOSIS — F419 Anxiety disorder, unspecified: Secondary | ICD-10-CM | POA: Diagnosis not present

## 2024-02-11 DIAGNOSIS — M6281 Muscle weakness (generalized): Secondary | ICD-10-CM | POA: Diagnosis not present

## 2024-02-11 DIAGNOSIS — R41841 Cognitive communication deficit: Secondary | ICD-10-CM | POA: Diagnosis not present

## 2024-02-11 DIAGNOSIS — R1312 Dysphagia, oropharyngeal phase: Secondary | ICD-10-CM | POA: Diagnosis not present

## 2024-02-11 DIAGNOSIS — I69354 Hemiplegia and hemiparesis following cerebral infarction affecting left non-dominant side: Secondary | ICD-10-CM | POA: Diagnosis not present

## 2024-02-11 DIAGNOSIS — I69391 Dysphagia following cerebral infarction: Secondary | ICD-10-CM | POA: Diagnosis not present

## 2024-02-12 DIAGNOSIS — M6281 Muscle weakness (generalized): Secondary | ICD-10-CM | POA: Diagnosis not present

## 2024-02-12 DIAGNOSIS — R41841 Cognitive communication deficit: Secondary | ICD-10-CM | POA: Diagnosis not present

## 2024-02-12 DIAGNOSIS — R1312 Dysphagia, oropharyngeal phase: Secondary | ICD-10-CM | POA: Diagnosis not present

## 2024-02-12 DIAGNOSIS — F419 Anxiety disorder, unspecified: Secondary | ICD-10-CM | POA: Diagnosis not present

## 2024-02-12 DIAGNOSIS — I69391 Dysphagia following cerebral infarction: Secondary | ICD-10-CM | POA: Diagnosis not present

## 2024-02-12 DIAGNOSIS — I69354 Hemiplegia and hemiparesis following cerebral infarction affecting left non-dominant side: Secondary | ICD-10-CM | POA: Diagnosis not present

## 2024-02-13 DIAGNOSIS — R1312 Dysphagia, oropharyngeal phase: Secondary | ICD-10-CM | POA: Diagnosis not present

## 2024-02-13 DIAGNOSIS — I69354 Hemiplegia and hemiparesis following cerebral infarction affecting left non-dominant side: Secondary | ICD-10-CM | POA: Diagnosis not present

## 2024-02-13 DIAGNOSIS — I69391 Dysphagia following cerebral infarction: Secondary | ICD-10-CM | POA: Diagnosis not present

## 2024-02-13 DIAGNOSIS — M6281 Muscle weakness (generalized): Secondary | ICD-10-CM | POA: Diagnosis not present

## 2024-02-13 DIAGNOSIS — R41841 Cognitive communication deficit: Secondary | ICD-10-CM | POA: Diagnosis not present

## 2024-02-13 DIAGNOSIS — F419 Anxiety disorder, unspecified: Secondary | ICD-10-CM | POA: Diagnosis not present

## 2024-02-14 DIAGNOSIS — F419 Anxiety disorder, unspecified: Secondary | ICD-10-CM | POA: Diagnosis not present

## 2024-02-14 DIAGNOSIS — R41841 Cognitive communication deficit: Secondary | ICD-10-CM | POA: Diagnosis not present

## 2024-02-14 DIAGNOSIS — I69391 Dysphagia following cerebral infarction: Secondary | ICD-10-CM | POA: Diagnosis not present

## 2024-02-14 DIAGNOSIS — I69354 Hemiplegia and hemiparesis following cerebral infarction affecting left non-dominant side: Secondary | ICD-10-CM | POA: Diagnosis not present

## 2024-02-14 DIAGNOSIS — R1312 Dysphagia, oropharyngeal phase: Secondary | ICD-10-CM | POA: Diagnosis not present

## 2024-02-14 DIAGNOSIS — M6281 Muscle weakness (generalized): Secondary | ICD-10-CM | POA: Diagnosis not present

## 2024-02-15 DIAGNOSIS — R41841 Cognitive communication deficit: Secondary | ICD-10-CM | POA: Diagnosis not present

## 2024-02-15 DIAGNOSIS — I69354 Hemiplegia and hemiparesis following cerebral infarction affecting left non-dominant side: Secondary | ICD-10-CM | POA: Diagnosis not present

## 2024-02-15 DIAGNOSIS — F419 Anxiety disorder, unspecified: Secondary | ICD-10-CM | POA: Diagnosis not present

## 2024-02-15 DIAGNOSIS — I69391 Dysphagia following cerebral infarction: Secondary | ICD-10-CM | POA: Diagnosis not present

## 2024-02-15 DIAGNOSIS — M6281 Muscle weakness (generalized): Secondary | ICD-10-CM | POA: Diagnosis not present

## 2024-02-15 DIAGNOSIS — R1312 Dysphagia, oropharyngeal phase: Secondary | ICD-10-CM | POA: Diagnosis not present

## 2024-02-16 DIAGNOSIS — R41841 Cognitive communication deficit: Secondary | ICD-10-CM | POA: Diagnosis not present

## 2024-02-16 DIAGNOSIS — M6281 Muscle weakness (generalized): Secondary | ICD-10-CM | POA: Diagnosis not present

## 2024-02-16 DIAGNOSIS — R1312 Dysphagia, oropharyngeal phase: Secondary | ICD-10-CM | POA: Diagnosis not present

## 2024-02-16 DIAGNOSIS — I69391 Dysphagia following cerebral infarction: Secondary | ICD-10-CM | POA: Diagnosis not present

## 2024-02-16 DIAGNOSIS — I69354 Hemiplegia and hemiparesis following cerebral infarction affecting left non-dominant side: Secondary | ICD-10-CM | POA: Diagnosis not present

## 2024-02-16 DIAGNOSIS — F419 Anxiety disorder, unspecified: Secondary | ICD-10-CM | POA: Diagnosis not present

## 2024-02-19 DIAGNOSIS — I69391 Dysphagia following cerebral infarction: Secondary | ICD-10-CM | POA: Diagnosis not present

## 2024-02-19 DIAGNOSIS — I69354 Hemiplegia and hemiparesis following cerebral infarction affecting left non-dominant side: Secondary | ICD-10-CM | POA: Diagnosis not present

## 2024-02-19 DIAGNOSIS — R41841 Cognitive communication deficit: Secondary | ICD-10-CM | POA: Diagnosis not present

## 2024-02-19 DIAGNOSIS — F419 Anxiety disorder, unspecified: Secondary | ICD-10-CM | POA: Diagnosis not present

## 2024-02-19 DIAGNOSIS — R1312 Dysphagia, oropharyngeal phase: Secondary | ICD-10-CM | POA: Diagnosis not present

## 2024-02-19 DIAGNOSIS — M6281 Muscle weakness (generalized): Secondary | ICD-10-CM | POA: Diagnosis not present

## 2024-02-20 DIAGNOSIS — I69354 Hemiplegia and hemiparesis following cerebral infarction affecting left non-dominant side: Secondary | ICD-10-CM | POA: Diagnosis not present

## 2024-02-20 DIAGNOSIS — M6281 Muscle weakness (generalized): Secondary | ICD-10-CM | POA: Diagnosis not present

## 2024-02-20 DIAGNOSIS — I69391 Dysphagia following cerebral infarction: Secondary | ICD-10-CM | POA: Diagnosis not present

## 2024-02-20 DIAGNOSIS — R41841 Cognitive communication deficit: Secondary | ICD-10-CM | POA: Diagnosis not present

## 2024-02-20 DIAGNOSIS — F419 Anxiety disorder, unspecified: Secondary | ICD-10-CM | POA: Diagnosis not present

## 2024-02-20 DIAGNOSIS — R1312 Dysphagia, oropharyngeal phase: Secondary | ICD-10-CM | POA: Diagnosis not present

## 2024-02-21 DIAGNOSIS — R1312 Dysphagia, oropharyngeal phase: Secondary | ICD-10-CM | POA: Diagnosis not present

## 2024-02-21 DIAGNOSIS — I69354 Hemiplegia and hemiparesis following cerebral infarction affecting left non-dominant side: Secondary | ICD-10-CM | POA: Diagnosis not present

## 2024-02-21 DIAGNOSIS — M6281 Muscle weakness (generalized): Secondary | ICD-10-CM | POA: Diagnosis not present

## 2024-02-21 DIAGNOSIS — F419 Anxiety disorder, unspecified: Secondary | ICD-10-CM | POA: Diagnosis not present

## 2024-02-21 DIAGNOSIS — R41841 Cognitive communication deficit: Secondary | ICD-10-CM | POA: Diagnosis not present

## 2024-02-21 DIAGNOSIS — I69391 Dysphagia following cerebral infarction: Secondary | ICD-10-CM | POA: Diagnosis not present

## 2024-02-22 DIAGNOSIS — R1312 Dysphagia, oropharyngeal phase: Secondary | ICD-10-CM | POA: Diagnosis not present

## 2024-02-22 DIAGNOSIS — I69354 Hemiplegia and hemiparesis following cerebral infarction affecting left non-dominant side: Secondary | ICD-10-CM | POA: Diagnosis not present

## 2024-02-22 DIAGNOSIS — F419 Anxiety disorder, unspecified: Secondary | ICD-10-CM | POA: Diagnosis not present

## 2024-02-22 DIAGNOSIS — I69391 Dysphagia following cerebral infarction: Secondary | ICD-10-CM | POA: Diagnosis not present

## 2024-02-22 DIAGNOSIS — R41841 Cognitive communication deficit: Secondary | ICD-10-CM | POA: Diagnosis not present

## 2024-02-22 DIAGNOSIS — M6281 Muscle weakness (generalized): Secondary | ICD-10-CM | POA: Diagnosis not present

## 2024-02-23 DIAGNOSIS — I69354 Hemiplegia and hemiparesis following cerebral infarction affecting left non-dominant side: Secondary | ICD-10-CM | POA: Diagnosis not present

## 2024-02-23 DIAGNOSIS — R41841 Cognitive communication deficit: Secondary | ICD-10-CM | POA: Diagnosis not present

## 2024-02-23 DIAGNOSIS — R1312 Dysphagia, oropharyngeal phase: Secondary | ICD-10-CM | POA: Diagnosis not present

## 2024-02-23 DIAGNOSIS — I69391 Dysphagia following cerebral infarction: Secondary | ICD-10-CM | POA: Diagnosis not present

## 2024-02-23 DIAGNOSIS — F419 Anxiety disorder, unspecified: Secondary | ICD-10-CM | POA: Diagnosis not present

## 2024-02-23 DIAGNOSIS — M6281 Muscle weakness (generalized): Secondary | ICD-10-CM | POA: Diagnosis not present

## 2024-02-24 DIAGNOSIS — I4891 Unspecified atrial fibrillation: Secondary | ICD-10-CM | POA: Diagnosis not present

## 2024-02-24 DIAGNOSIS — G8929 Other chronic pain: Secondary | ICD-10-CM | POA: Diagnosis not present

## 2024-02-24 DIAGNOSIS — I10 Essential (primary) hypertension: Secondary | ICD-10-CM | POA: Diagnosis not present

## 2024-02-24 DIAGNOSIS — E119 Type 2 diabetes mellitus without complications: Secondary | ICD-10-CM | POA: Diagnosis not present

## 2024-02-24 DIAGNOSIS — G40909 Epilepsy, unspecified, not intractable, without status epilepticus: Secondary | ICD-10-CM | POA: Diagnosis not present

## 2024-03-07 DIAGNOSIS — I10 Essential (primary) hypertension: Secondary | ICD-10-CM | POA: Diagnosis not present

## 2024-03-07 DIAGNOSIS — G8929 Other chronic pain: Secondary | ICD-10-CM | POA: Diagnosis not present

## 2024-03-07 DIAGNOSIS — G40909 Epilepsy, unspecified, not intractable, without status epilepticus: Secondary | ICD-10-CM | POA: Diagnosis not present

## 2024-03-07 DIAGNOSIS — I4891 Unspecified atrial fibrillation: Secondary | ICD-10-CM | POA: Diagnosis not present

## 2024-03-07 DIAGNOSIS — E119 Type 2 diabetes mellitus without complications: Secondary | ICD-10-CM | POA: Diagnosis not present

## 2024-04-09 DIAGNOSIS — J329 Chronic sinusitis, unspecified: Secondary | ICD-10-CM | POA: Diagnosis not present

## 2024-04-09 DIAGNOSIS — J069 Acute upper respiratory infection, unspecified: Secondary | ICD-10-CM | POA: Diagnosis not present

## 2024-04-09 DIAGNOSIS — G40909 Epilepsy, unspecified, not intractable, without status epilepticus: Secondary | ICD-10-CM | POA: Diagnosis not present

## 2024-04-15 DIAGNOSIS — I10 Essential (primary) hypertension: Secondary | ICD-10-CM | POA: Diagnosis not present

## 2024-04-15 DIAGNOSIS — E119 Type 2 diabetes mellitus without complications: Secondary | ICD-10-CM | POA: Diagnosis not present

## 2024-04-15 DIAGNOSIS — I4891 Unspecified atrial fibrillation: Secondary | ICD-10-CM | POA: Diagnosis not present

## 2024-04-15 DIAGNOSIS — G40909 Epilepsy, unspecified, not intractable, without status epilepticus: Secondary | ICD-10-CM | POA: Diagnosis not present

## 2024-04-16 DIAGNOSIS — J069 Acute upper respiratory infection, unspecified: Secondary | ICD-10-CM | POA: Diagnosis not present

## 2024-04-16 DIAGNOSIS — J329 Chronic sinusitis, unspecified: Secondary | ICD-10-CM | POA: Diagnosis not present

## 2024-04-17 ENCOUNTER — Ambulatory Visit (INDEPENDENT_AMBULATORY_CARE_PROVIDER_SITE_OTHER): Admitting: Adult Health

## 2024-04-17 ENCOUNTER — Encounter: Payer: Self-pay | Admitting: Adult Health

## 2024-04-17 VITALS — BP 147/84 | HR 78

## 2024-04-17 DIAGNOSIS — R569 Unspecified convulsions: Secondary | ICD-10-CM

## 2024-04-17 DIAGNOSIS — Z5181 Encounter for therapeutic drug level monitoring: Secondary | ICD-10-CM

## 2024-04-17 DIAGNOSIS — G8114 Spastic hemiplegia affecting left nondominant side: Secondary | ICD-10-CM

## 2024-04-17 DIAGNOSIS — I6310 Cerebral infarction due to embolism of unspecified precerebral artery: Secondary | ICD-10-CM | POA: Diagnosis not present

## 2024-04-17 DIAGNOSIS — I639 Cerebral infarction, unspecified: Secondary | ICD-10-CM | POA: Diagnosis not present

## 2024-04-17 NOTE — Progress Notes (Signed)
 [facility] [address] [place]. [place] [address] [phone]       OFFICE FOLLOW-UP NOTE  Allison Caldwell Date of Birth:  1946/07/20 Medical Record Number:  [id number]   Primary neurologist: Dr. Janett Caldwell Reason for visit: Stroke follow-up  Chief Complaint  Patient presents with   Cerebrovascular Accident    Rm 3 with son  Pt is well and stable. Reports no new seizure or stroke concerns since last visit.  Patient does mention swelling in her legs and feet.      HPI:   Update 04/17/2024 JM: Patient returns for follow-up visit accompanied by her son.  Limited information as patient is a poor historian and no information provided from [facility].  She reports overall stable from stroke standpoint without new stroke/TIA symptoms.  Currently working with physical and Occupational Therapy, reports continued left-sided weakness, complains of left shoulder neck pain, therapy has been trying to work on this per patient.  Remains nonambulatory, transfers to wheelchair via [facility] lift.  Continued cognitive impairment, frequently speaks off topic, but denies any progression.  Does complain of lower extremity swelling which is not new, deferred this concern to facility to address as chronic issue.  She does report frustration of being in a rehab and frequently speaks of dissatisfaction with the care.  Denies any additional seizure activity.  Son reports Keppra  was discontinued as previously advised and remains on Depakote  and Vimpat . Will request medication list be faxed to confirm this.  Reports tolerating current medication without difficulty.  It is unclear if she is currently being followed by PCP or facility provider.  Son reports all medications the same since prior visit including Eliquis  and pravastatin  (except for d/c'ing keppra )    History provided for reference purposes only Initial visit 01/04/2024 Dr. Janett Caldwell: Allison Caldwell is a 78 year old African-American  lady seen today for initial office follow-up visit following hospital admission for stroke in May 2024.  She is accompanied by her son and history is obtained from them and review of electronic medical records and I personally reviewed pertinent available imaging films in PACS.  She has past medical history of diabetes, hypertension, morbid obesity, cardiomyopathy, depression, TIA and arthritis.  She presented on 03/25/2023 with a fall when she tried to get out of bed and head hit the dresser.  She was in bed all day and was hard to establish last known well.  She was found to have left hemiparesis and NIH stroke scale of 4 on admission.  CT head showed patchy hemorrhage in the right parietal lobe suggestive of hemorrhagic infarct or contusion.  CT angiogram of the head and neck showed no emergent large vessel occlusion.  MRI scan of the brain confirmed moderate size right MCA and small ACA infarcts.  2D echo showed ejection fraction of 50 to 55%.  LDL cholesterol 54 mg percent and hemoglobin A1c was elevated at 9.8.  Urine drug screen was negative.  Patient was on aspirin  prior to admission but 20 platelet therapy was held initially due to intracerebral hemorrhage but given history of A-fib she was eventually started several weeks later on Eliquis .  She did have focal seizures on 03/26/2024 with left arm rhythmic twitching's involving left eyelid and spreading to the right side.  She was able to speak and communicate during these episodes.  Long-term EEG monitoring for 24 hours showed 23 focal seizures arising from the right central parietal region lasting 1 to 5 minutes.  She was started on  Depakote  and subsequent EEG seizure frequency declined to 6 seizures or 24 hours the next day and 2 days later she had no seizures and monitoring.  She had refractory seizures and was started initially on Keppra  and then Vimpat  and then valproic  acid.  .  Patient transferred for rehabilitation needs to skilled nursing facility  where she is presently still residing.  She is still has left hemiparesis and is wheelchair-bound.  She is working with physical and Occupational Therapy but is yet unable to stand or walk.  She was readmitted on 06/10/2023 to 06/16/2023 for episode of hypoxic respiratory failure which she is gradually recovering. She had a follow-up CT head on 06/11/2023 which showed encephalomalacia in the right frontoparietal region without any acute abnormality.  Patient's son states he has not noticed any significant seizures since she is left the hospital.  She has had some mild short-term memory and cognitive decline since her stroke and seizures but it is not progressive.   ROS:   14 system review of systems is positive for those listed in HPI and all other systems negative  PMH:  Past Medical History:  Diagnosis Date   Anxiety    Cardiomyopathy    Degenerative joint disease    Depression    Diabetes mellitus    NIDDM   Hypertension    Morbid obesity with BMI of 45.0-49.9, adult (HCC)    TIA (transient ischemic attack)     Social History:  Social History   Socioeconomic History   Marital status: Divorced    Spouse name: Not on file   Number of children: Not on file   Years of education: Not on file   Highest education level: Not on file  Occupational History   Occupation: House keeping-retired since 2006    Employer: RETIRED  Tobacco Use   Smoking status: Former    Types: Cigarettes   Smokeless tobacco: Never   Tobacco comments:    Quit x 2 yrrs.  Substance and Sexual Activity   Alcohol use: Yes    Comment: vodka with water  on occasions    Drug use: No    Comment: THC occasionally.   Sexual activity: Not on file  Other Topics Concern   Not on file  Social History Narrative   Lives in [place].   Social Drivers of Corporate investment banker Strain: Not on file  Food Insecurity: No Food Insecurity (06/10/2023)   Hunger Vital Sign    Worried About Running Out of Food in the  Last Year: Never true    Ran Out of Food in the Last Year: Never true  Transportation Needs: No Transportation Needs (06/10/2023)   PRAPARE - Administrator, Civil Service (Medical): No    Lack of Transportation (Non-Medical): No  Physical Activity: Not on file  Stress: Not on file  Social Connections: Not on file  Intimate Partner Violence: Not At Risk (06/10/2023)   Humiliation, Afraid, Rape, and Kick questionnaire    Fear of Current or Ex-Partner: No    Emotionally Abused: No    Physically Abused: No    Sexually Abused: No    Medications:   Current Outpatient Medications on File Prior to Visit  Medication Sig Dispense Refill   Accu-Chek FastClix Lancets MISC Check blood sugar 1 time a day 102 each 4   Accu-Chek Softclix Lancets lancets Use as directed up to 4 times daily. 100 each 0   Acetaminophen  1000 MG/100ML SOLN Take 1,000 mg by  mouth every 12 (twelve) hours.     albuterol  (VENTOLIN  HFA) 108 (90 Base) MCG/ACT inhaler Inhale 1-2 puffs into the lungs every 4 (four) hours as needed.     amLODipine  (NORVASC ) 10 MG tablet Take 1 tablet (10 mg total) by mouth daily. 30 tablet 0   apixaban  (ELIQUIS ) 5 MG TABS tablet Take 1 tablet (5 mg total) by mouth 2 (two) times daily. 60 tablet 0   benzonatate (TESSALON) 100 MG capsule Take 100 mg by mouth 3 (three) times daily as needed for cough.     blood glucose meter kit and supplies KIT Use up to four times daily as directed. 1 each 0   Blood Glucose Monitoring Suppl (ACCU-CHEK GUIDE) w/Device KIT 1 each by Does not apply route daily. Check blood sugar 1 time a day 1 kit 1   feeding supplement (ENSURE ENLIVE / ENSURE PLUS) LIQD Take 237 mLs by mouth 2 (two) times daily between meals. 237 mL 12   Fluticasone -Umeclidin-Vilant (TRELEGY ELLIPTA ) 100-62.5-25 MCG/ACT AEPB Inhale 1 puff into the lungs daily. 60 each 1   glucose blood (ACCU-CHEK GUIDE) test strip Check blood sugar 1 time per day 100 each 4   glucose blood test strip Use as  directed up to four times daily 100 each 0   insulin  glargine-yfgn (SEMGLEE ) 100 UNIT/ML injection Inject 0.1 mLs (10 Units total) into the skin daily. (Patient taking differently: Inject 13 Units into the skin at bedtime.) 10 mL 11   Insulin  Pen Needle 31G X 8 MM MISC Use 4 (four) times daily with insulin  pens. 100 each 1   lacosamide  (VIMPAT ) 200 MG TABS tablet Take 1 tablet (200 mg total) by mouth 2 (two) times daily. 60 tablet 0   levETIRAcetam  (KEPPRA ) 750 MG tablet Take 1,500 mg by mouth 2 (two) times daily.     losartan  (COZAAR ) 100 MG tablet Take 1 tablet (100 mg total) by mouth daily. 30 tablet 0   metFORMIN  (GLUCOPHAGE ) 500 MG tablet Take 1 tablet (500 mg total) by mouth 2 (two) times daily with a meal. 180 tablet 1   metoprolol  succinate (TOPROL -XL) 100 MG 24 hr tablet Take 1 tablet (100 mg total) by mouth daily. Take with or immediately following a meal. 30 tablet 0   Multiple Vitamin (MULTIVITAMIN WITH MINERALS) TABS tablet Take 1 tablet by mouth daily. 90 tablet 3   pantoprazole  (PROTONIX ) 40 MG tablet Take 1 tablet (40 mg total) by mouth daily.     pravastatin  (PRAVACHOL ) 20 MG tablet TAKE 1 TABLET AT BEDTIME (Patient taking differently: Take 20 mg by mouth daily.) 90 tablet 1   QUEtiapine  (SEROQUEL ) 50 MG tablet Take 1 tablet (50 mg total) by mouth daily. 30 tablet 0   sertraline  (ZOLOFT ) 100 MG tablet Take 1 tablet (100 mg total) by mouth daily. 90 tablet 1   TIADYLT  ER 420 MG 24 hr capsule TAKE 1 CAPSULE EVERY DAY (Patient taking differently: Take 420 mg by mouth daily.) 90 capsule 1   valproic  acid (DEPAKENE ) 250 MG/5ML solution Take 10 mLs (500 mg total) by mouth every 8 (eight) hours. 300 mL 0   No current facility-administered medications on file prior to visit.    Allergies:   Allergies  Allergen Reactions   Ace Inhibitors Other (See Comments)     cough   Penicillins Other (See Comments)    Muscle spasms, Has patient had a PCN reaction causing immediate rash,  facial/tongue/throat swelling, SOB or lightheadedness with hypotension: Yes Has patient had  a PCN reaction causing severe rash involving mucus membranes or skin necrosis: No Has patient had a PCN reaction that required hospitalization No Has patient had a PCN reaction occurring within the last 10 years: Yes If all of the above answers are NO, then may proceed with Cephalosporin use.     Physical Exam Today's Vitals   04/17/24 0943  BP: (!) 147/84  Pulse: 78   There is no height or weight on file to calculate BMI.   General: Obese elderly African-American lady, seated, in no evident distress Head: head normocephalic and atraumatic.  Neck: supple with no carotid or supraclavicular bruits Cardiovascular: regular rate and rhythm, no murmurs Musculoskeletal: no deformity Skin:  no rash/petichiae, +2 LE pitting edema bilaterally  Neurologic Exam Mental Status: Awake and fully alert.  Mild dysarthria although possibly due to poor denture, no evidence of aphasia.  Follows commands without difficulty.  Recent memory impaired and remote memory intact. Attention span, concentration and fund of knowledge mildly impaired, would frequently speak off topic. Mood and affect appropriate.  Cranial Nerves: Pupils equal, briskly reactive to light. Extraocular movements full without nystagmus. Visual fields show dense left homonymous hemianopsia to confrontation. Hearing intact. Facial sensation intact.  Moderate left lower facial weakness.  Tongue, palate moves normally and symmetrically.  Motor: Spastic left hemiparesis with 3/5 left upper extremity strength (although difficulty fully testing d/t shoulder pain) with mildly reduced grip strength.  Limited left shoulder ROM.  2/5 left lower extremity strength.  Spasticity on the left with increased tone.  Normal strength on the right. Sensory.: diminished left hemibody sensation to 2 touch ,pinprick .position and vibration  Coordination: Normal on the  right and impaired on the left.   Gait and Station: Deferred as nonambulatory       ASSESSMENT: 78 year old African-American lady with right hemispheric infarcts in May 2024 secondary to new onset A-fib with post stroke partial seizures.  She still has significant spastic left hemiparesis and hemianopsia and remains wheelchair-bound.  She is doing well with the seizure standpoint without recurrent focal seizures.     PLAN:  Right hemispheric stroke Continue working with therapies for hopeful further recovery.  Requested facility f/u regarding shoulder pain, unsure if currently on medication to help with symptoms, discussed proper positioning of arm. Will request MAR be faxed to office for review Continue Eliquis  5 mg twice daily and pravastatin  20 mg daily for secondary stroke prevention managed/prescribed by PCP Continue close PCP and cardiology follow-up for aggressive stroke risk factor management  Seizures, post stroke No additional seizures Continue lacosamide  200 mg twice daily Continue valproic  acid 500 mg every 8 hours Check lab work today Has since d/c'd keppra  per son, will consider further reducing dosage at follow-up visit Advised to call with any recurrent seizure activity    Follow-up in 6 months with Dr. Janett Caldwell for further recommendations regarding medication adjustments or call earlier if needed    I personally spent a total of 45 minutes in the care of the patient today including preparing to see the patient, performing a medically appropriate exam/evaluation, counseling and educating, placing orders, and documenting clinical information in the EHR. This is our first time meeting and time has been spent reviewing past medical history and relevant medical records.  Johny Nap, AGNP-BC  [facility] [address] [place], [place] [address]  Phone [phone] Fax [phone] Note: This document was prepared with digital  dictation and possible smart phrase technology. Any transcriptional errors that result from  this process are unintentional.

## 2024-04-17 NOTE — Patient Instructions (Addendum)
 Continue working with therapies - would recommend placement of pillow under left arm to help support and reduce shoulder pain. Please monitor shoulder pain and provide treatment as indicated (unsure if currently on anything for pain as medication list not provided)  Continue Eliquis   and pravastatin  for secondary stroke prevention managed/prescribed by PCP  Continue Depakote  and Vimpat  for seizure prevention - will check lab work today. Will consider further reducing after completion of labs  C/o lower extremity swelling - this was deferred back to facility provider - please evaluate this if not already doing  Continue to follow up with PCP regarding blood pressure, cholesterol and diabetes management  Maintain strict control of hypertension with blood pressure goal below 130/90, diabetes with hemoglobin A1c goal below 7.0 % and cholesterol with LDL cholesterol (bad cholesterol) goal below 70 mg/dL.   Signs of a Stroke? Follow the BEFAST method:  Balance Watch for a sudden loss of balance, trouble with coordination or vertigo Eyes Is there a sudden loss of vision in one or both eyes? Or double vision?  Face: Ask the person to smile. Does one side of the face droop or is it numb?  Arms: Ask the person to raise both arms. Does one arm drift downward? Is there weakness or numbness of a leg? Speech: Ask the person to repeat a simple phrase. Does the speech sound slurred/strange? Is the person confused ? Time: If you observe any of these signs, call 911.     Followup in the future with me in 6 months or call earlier if needed       Thank you for coming to see us  at [facility]. I hope we have been able to provide you high quality care today.  You may receive a patient satisfaction survey over the next few weeks. We would appreciate your feedback and comments so that we may continue to improve ourselves and the health of our patients.

## 2024-04-18 ENCOUNTER — Ambulatory Visit: Admitting: Adult Health

## 2024-04-18 DIAGNOSIS — I69354 Hemiplegia and hemiparesis following cerebral infarction affecting left non-dominant side: Secondary | ICD-10-CM | POA: Diagnosis not present

## 2024-04-18 DIAGNOSIS — M6281 Muscle weakness (generalized): Secondary | ICD-10-CM | POA: Diagnosis not present

## 2024-04-18 LAB — CBC WITH DIFFERENTIAL/PLATELET
Basophils Absolute: 0 10*3/uL (ref 0.0–0.2)
Basos: 0 %
Hematocrit: 41.5 % (ref 34.0–46.6)
Hemoglobin: 13.2 g/dL (ref 11.1–15.9)
Immature Granulocytes: 0 %
Lymphocytes Absolute: 2.8 10*3/uL (ref 0.7–3.1)
Monocytes Absolute: 0.5 10*3/uL (ref 0.1–0.9)
Platelets: 220 10*3/uL (ref 150–450)
RBC: 4.34 x10E6/uL (ref 3.77–5.28)
RDW: 13.1 % (ref 11.7–15.4)

## 2024-04-18 LAB — COMPREHENSIVE METABOLIC PANEL WITH GFR
Albumin: 4 g/dL (ref 3.8–4.8)
Alkaline Phosphatase: 60 IU/L (ref 44–121)
Bilirubin Total: 0.3 mg/dL (ref 0.0–1.2)
Creatinine, Ser: 0.46 mg/dL — ABNORMAL LOW (ref 0.57–1.00)
Globulin, Total: 2.7 g/dL (ref 1.5–4.5)
Glucose: 150 mg/dL — ABNORMAL HIGH (ref 70–99)
Sodium: 142 mmol/L (ref 134–144)
eGFR: 97 mL/min/{1.73_m2} (ref >59)

## 2024-04-19 DIAGNOSIS — I69354 Hemiplegia and hemiparesis following cerebral infarction affecting left non-dominant side: Secondary | ICD-10-CM | POA: Diagnosis not present

## 2024-04-19 DIAGNOSIS — M6281 Muscle weakness (generalized): Secondary | ICD-10-CM | POA: Diagnosis not present

## 2024-04-22 ENCOUNTER — Ambulatory Visit: Payer: Self-pay | Admitting: Adult Health

## 2024-04-22 ENCOUNTER — Other Ambulatory Visit: Payer: Self-pay | Admitting: Adult Health

## 2024-04-22 DIAGNOSIS — M6281 Muscle weakness (generalized): Secondary | ICD-10-CM | POA: Diagnosis not present

## 2024-04-22 DIAGNOSIS — I69354 Hemiplegia and hemiparesis following cerebral infarction affecting left non-dominant side: Secondary | ICD-10-CM | POA: Diagnosis not present

## 2024-04-22 DIAGNOSIS — I639 Cerebral infarction, unspecified: Secondary | ICD-10-CM

## 2024-04-22 LAB — COMPREHENSIVE METABOLIC PANEL WITH GFR
ALT: 6 IU/L (ref 0–32)
AST: 9 IU/L (ref 0–40)
BUN/Creatinine Ratio: 26 (ref 12–28)
BUN: 12 mg/dL (ref 8–27)
CO2: 22 mmol/L (ref 20–29)
Calcium: 9.2 mg/dL (ref 8.7–10.3)
Chloride: 98 mmol/L (ref 96–106)
Potassium: 3.7 mmol/L (ref 3.5–5.2)
Total Protein: 6.7 g/dL (ref 6.0–8.5)

## 2024-04-22 LAB — CBC WITH DIFFERENTIAL/PLATELET
EOS (ABSOLUTE): 0.1 10*3/uL (ref 0.0–0.4)
Eos: 1 %
Immature Grans (Abs): 0 10*3/uL (ref 0.0–0.1)
Lymphs: 44 %
MCH: 30.4 pg (ref 26.6–33.0)
MCHC: 31.8 g/dL (ref 31.5–35.7)
MCV: 96 fL (ref 79–97)
Monocytes: 8 %
Neutrophils Absolute: 3 10*3/uL (ref 1.4–7.0)
Neutrophils: 47 %
WBC: 6.4 10*3/uL (ref 3.4–10.8)

## 2024-04-22 LAB — LACOSAMIDE: Lacosamide: 13.9 ug/mL — ABNORMAL HIGH (ref 5.0–10.0)

## 2024-04-22 LAB — VALPROIC ACID LEVEL: Valproic Acid Lvl: 45 ug/mL — ABNORMAL LOW (ref 50–100)

## 2024-04-23 DIAGNOSIS — I69354 Hemiplegia and hemiparesis following cerebral infarction affecting left non-dominant side: Secondary | ICD-10-CM | POA: Diagnosis not present

## 2024-04-23 DIAGNOSIS — M6281 Muscle weakness (generalized): Secondary | ICD-10-CM | POA: Diagnosis not present

## 2024-04-24 DIAGNOSIS — I69354 Hemiplegia and hemiparesis following cerebral infarction affecting left non-dominant side: Secondary | ICD-10-CM | POA: Diagnosis not present

## 2024-04-24 DIAGNOSIS — M6281 Muscle weakness (generalized): Secondary | ICD-10-CM | POA: Diagnosis not present

## 2024-04-25 DIAGNOSIS — I69354 Hemiplegia and hemiparesis following cerebral infarction affecting left non-dominant side: Secondary | ICD-10-CM | POA: Diagnosis not present

## 2024-04-25 DIAGNOSIS — M6281 Muscle weakness (generalized): Secondary | ICD-10-CM | POA: Diagnosis not present

## 2024-04-26 DIAGNOSIS — I69354 Hemiplegia and hemiparesis following cerebral infarction affecting left non-dominant side: Secondary | ICD-10-CM | POA: Diagnosis not present

## 2024-04-26 DIAGNOSIS — M6281 Muscle weakness (generalized): Secondary | ICD-10-CM | POA: Diagnosis not present

## 2024-04-28 DIAGNOSIS — M6281 Muscle weakness (generalized): Secondary | ICD-10-CM | POA: Diagnosis not present

## 2024-04-28 DIAGNOSIS — I69354 Hemiplegia and hemiparesis following cerebral infarction affecting left non-dominant side: Secondary | ICD-10-CM | POA: Diagnosis not present

## 2024-04-29 DIAGNOSIS — M6281 Muscle weakness (generalized): Secondary | ICD-10-CM | POA: Diagnosis not present

## 2024-04-29 DIAGNOSIS — I69354 Hemiplegia and hemiparesis following cerebral infarction affecting left non-dominant side: Secondary | ICD-10-CM | POA: Diagnosis not present

## 2024-04-30 DIAGNOSIS — M6281 Muscle weakness (generalized): Secondary | ICD-10-CM | POA: Diagnosis not present

## 2024-04-30 DIAGNOSIS — R1312 Dysphagia, oropharyngeal phase: Secondary | ICD-10-CM | POA: Diagnosis not present

## 2024-04-30 DIAGNOSIS — I69391 Dysphagia following cerebral infarction: Secondary | ICD-10-CM | POA: Diagnosis not present

## 2024-04-30 DIAGNOSIS — I69354 Hemiplegia and hemiparesis following cerebral infarction affecting left non-dominant side: Secondary | ICD-10-CM | POA: Diagnosis not present

## 2024-05-01 DIAGNOSIS — E119 Type 2 diabetes mellitus without complications: Secondary | ICD-10-CM | POA: Diagnosis not present

## 2024-05-01 DIAGNOSIS — G40909 Epilepsy, unspecified, not intractable, without status epilepticus: Secondary | ICD-10-CM | POA: Diagnosis not present

## 2024-05-01 DIAGNOSIS — I4891 Unspecified atrial fibrillation: Secondary | ICD-10-CM | POA: Diagnosis not present

## 2024-05-01 DIAGNOSIS — I10 Essential (primary) hypertension: Secondary | ICD-10-CM | POA: Diagnosis not present

## 2024-05-01 DIAGNOSIS — I639 Cerebral infarction, unspecified: Secondary | ICD-10-CM | POA: Diagnosis not present

## 2024-05-04 DIAGNOSIS — R1312 Dysphagia, oropharyngeal phase: Secondary | ICD-10-CM | POA: Diagnosis not present

## 2024-05-04 DIAGNOSIS — I69391 Dysphagia following cerebral infarction: Secondary | ICD-10-CM | POA: Diagnosis not present

## 2024-05-04 DIAGNOSIS — I69354 Hemiplegia and hemiparesis following cerebral infarction affecting left non-dominant side: Secondary | ICD-10-CM | POA: Diagnosis not present

## 2024-05-04 DIAGNOSIS — M6281 Muscle weakness (generalized): Secondary | ICD-10-CM | POA: Diagnosis not present

## 2024-05-06 DIAGNOSIS — M6281 Muscle weakness (generalized): Secondary | ICD-10-CM | POA: Diagnosis not present

## 2024-05-06 DIAGNOSIS — R1312 Dysphagia, oropharyngeal phase: Secondary | ICD-10-CM | POA: Diagnosis not present

## 2024-05-06 DIAGNOSIS — I69391 Dysphagia following cerebral infarction: Secondary | ICD-10-CM | POA: Diagnosis not present

## 2024-05-06 DIAGNOSIS — I69354 Hemiplegia and hemiparesis following cerebral infarction affecting left non-dominant side: Secondary | ICD-10-CM | POA: Diagnosis not present

## 2024-05-07 DIAGNOSIS — I69391 Dysphagia following cerebral infarction: Secondary | ICD-10-CM | POA: Diagnosis not present

## 2024-05-07 DIAGNOSIS — I69354 Hemiplegia and hemiparesis following cerebral infarction affecting left non-dominant side: Secondary | ICD-10-CM | POA: Diagnosis not present

## 2024-05-07 DIAGNOSIS — M6281 Muscle weakness (generalized): Secondary | ICD-10-CM | POA: Diagnosis not present

## 2024-05-07 DIAGNOSIS — R1312 Dysphagia, oropharyngeal phase: Secondary | ICD-10-CM | POA: Diagnosis not present

## 2024-05-07 DIAGNOSIS — H109 Unspecified conjunctivitis: Secondary | ICD-10-CM | POA: Diagnosis not present

## 2024-05-08 DIAGNOSIS — I69354 Hemiplegia and hemiparesis following cerebral infarction affecting left non-dominant side: Secondary | ICD-10-CM | POA: Diagnosis not present

## 2024-05-08 DIAGNOSIS — M6281 Muscle weakness (generalized): Secondary | ICD-10-CM | POA: Diagnosis not present

## 2024-05-08 DIAGNOSIS — R1312 Dysphagia, oropharyngeal phase: Secondary | ICD-10-CM | POA: Diagnosis not present

## 2024-05-08 DIAGNOSIS — I69391 Dysphagia following cerebral infarction: Secondary | ICD-10-CM | POA: Diagnosis not present

## 2024-05-09 DIAGNOSIS — R1312 Dysphagia, oropharyngeal phase: Secondary | ICD-10-CM | POA: Diagnosis not present

## 2024-05-09 DIAGNOSIS — I69354 Hemiplegia and hemiparesis following cerebral infarction affecting left non-dominant side: Secondary | ICD-10-CM | POA: Diagnosis not present

## 2024-05-09 DIAGNOSIS — M6281 Muscle weakness (generalized): Secondary | ICD-10-CM | POA: Diagnosis not present

## 2024-05-09 DIAGNOSIS — I69391 Dysphagia following cerebral infarction: Secondary | ICD-10-CM | POA: Diagnosis not present

## 2024-05-10 DIAGNOSIS — I69354 Hemiplegia and hemiparesis following cerebral infarction affecting left non-dominant side: Secondary | ICD-10-CM | POA: Diagnosis not present

## 2024-05-10 DIAGNOSIS — R1312 Dysphagia, oropharyngeal phase: Secondary | ICD-10-CM | POA: Diagnosis not present

## 2024-05-10 DIAGNOSIS — I69391 Dysphagia following cerebral infarction: Secondary | ICD-10-CM | POA: Diagnosis not present

## 2024-05-10 DIAGNOSIS — M6281 Muscle weakness (generalized): Secondary | ICD-10-CM | POA: Diagnosis not present

## 2024-05-11 DIAGNOSIS — I69354 Hemiplegia and hemiparesis following cerebral infarction affecting left non-dominant side: Secondary | ICD-10-CM | POA: Diagnosis not present

## 2024-05-11 DIAGNOSIS — M6281 Muscle weakness (generalized): Secondary | ICD-10-CM | POA: Diagnosis not present

## 2024-05-11 DIAGNOSIS — I69391 Dysphagia following cerebral infarction: Secondary | ICD-10-CM | POA: Diagnosis not present

## 2024-05-11 DIAGNOSIS — R1312 Dysphagia, oropharyngeal phase: Secondary | ICD-10-CM | POA: Diagnosis not present

## 2024-05-13 DIAGNOSIS — M6281 Muscle weakness (generalized): Secondary | ICD-10-CM | POA: Diagnosis not present

## 2024-05-13 DIAGNOSIS — I69354 Hemiplegia and hemiparesis following cerebral infarction affecting left non-dominant side: Secondary | ICD-10-CM | POA: Diagnosis not present

## 2024-05-13 DIAGNOSIS — I69391 Dysphagia following cerebral infarction: Secondary | ICD-10-CM | POA: Diagnosis not present

## 2024-05-13 DIAGNOSIS — R1312 Dysphagia, oropharyngeal phase: Secondary | ICD-10-CM | POA: Diagnosis not present

## 2024-05-14 DIAGNOSIS — R1312 Dysphagia, oropharyngeal phase: Secondary | ICD-10-CM | POA: Diagnosis not present

## 2024-05-14 DIAGNOSIS — I69391 Dysphagia following cerebral infarction: Secondary | ICD-10-CM | POA: Diagnosis not present

## 2024-05-14 DIAGNOSIS — M6281 Muscle weakness (generalized): Secondary | ICD-10-CM | POA: Diagnosis not present

## 2024-05-14 DIAGNOSIS — I69354 Hemiplegia and hemiparesis following cerebral infarction affecting left non-dominant side: Secondary | ICD-10-CM | POA: Diagnosis not present

## 2024-05-15 DIAGNOSIS — I69354 Hemiplegia and hemiparesis following cerebral infarction affecting left non-dominant side: Secondary | ICD-10-CM | POA: Diagnosis not present

## 2024-05-15 DIAGNOSIS — I69391 Dysphagia following cerebral infarction: Secondary | ICD-10-CM | POA: Diagnosis not present

## 2024-05-15 DIAGNOSIS — R1312 Dysphagia, oropharyngeal phase: Secondary | ICD-10-CM | POA: Diagnosis not present

## 2024-05-15 DIAGNOSIS — M6281 Muscle weakness (generalized): Secondary | ICD-10-CM | POA: Diagnosis not present

## 2024-05-16 DIAGNOSIS — M6281 Muscle weakness (generalized): Secondary | ICD-10-CM | POA: Diagnosis not present

## 2024-05-16 DIAGNOSIS — I69354 Hemiplegia and hemiparesis following cerebral infarction affecting left non-dominant side: Secondary | ICD-10-CM | POA: Diagnosis not present

## 2024-05-16 DIAGNOSIS — I69391 Dysphagia following cerebral infarction: Secondary | ICD-10-CM | POA: Diagnosis not present

## 2024-05-16 DIAGNOSIS — R1312 Dysphagia, oropharyngeal phase: Secondary | ICD-10-CM | POA: Diagnosis not present

## 2024-05-17 DIAGNOSIS — M6281 Muscle weakness (generalized): Secondary | ICD-10-CM | POA: Diagnosis not present

## 2024-05-17 DIAGNOSIS — I69354 Hemiplegia and hemiparesis following cerebral infarction affecting left non-dominant side: Secondary | ICD-10-CM | POA: Diagnosis not present

## 2024-05-17 DIAGNOSIS — R1312 Dysphagia, oropharyngeal phase: Secondary | ICD-10-CM | POA: Diagnosis not present

## 2024-05-17 DIAGNOSIS — I69391 Dysphagia following cerebral infarction: Secondary | ICD-10-CM | POA: Diagnosis not present

## 2024-05-20 DIAGNOSIS — M6281 Muscle weakness (generalized): Secondary | ICD-10-CM | POA: Diagnosis not present

## 2024-05-20 DIAGNOSIS — R1312 Dysphagia, oropharyngeal phase: Secondary | ICD-10-CM | POA: Diagnosis not present

## 2024-05-20 DIAGNOSIS — I69391 Dysphagia following cerebral infarction: Secondary | ICD-10-CM | POA: Diagnosis not present

## 2024-05-20 DIAGNOSIS — I69354 Hemiplegia and hemiparesis following cerebral infarction affecting left non-dominant side: Secondary | ICD-10-CM | POA: Diagnosis not present

## 2024-05-21 ENCOUNTER — Other Ambulatory Visit: Admitting: *Deleted

## 2024-05-21 DIAGNOSIS — I639 Cerebral infarction, unspecified: Secondary | ICD-10-CM

## 2024-05-21 DIAGNOSIS — I69354 Hemiplegia and hemiparesis following cerebral infarction affecting left non-dominant side: Secondary | ICD-10-CM | POA: Diagnosis not present

## 2024-05-21 DIAGNOSIS — I69391 Dysphagia following cerebral infarction: Secondary | ICD-10-CM | POA: Diagnosis not present

## 2024-05-21 DIAGNOSIS — M6281 Muscle weakness (generalized): Secondary | ICD-10-CM | POA: Diagnosis not present

## 2024-05-21 DIAGNOSIS — R1312 Dysphagia, oropharyngeal phase: Secondary | ICD-10-CM | POA: Diagnosis not present

## 2024-05-22 DIAGNOSIS — I69354 Hemiplegia and hemiparesis following cerebral infarction affecting left non-dominant side: Secondary | ICD-10-CM | POA: Diagnosis not present

## 2024-05-22 DIAGNOSIS — R1312 Dysphagia, oropharyngeal phase: Secondary | ICD-10-CM | POA: Diagnosis not present

## 2024-05-22 DIAGNOSIS — I69391 Dysphagia following cerebral infarction: Secondary | ICD-10-CM | POA: Diagnosis not present

## 2024-05-22 DIAGNOSIS — M6281 Muscle weakness (generalized): Secondary | ICD-10-CM | POA: Diagnosis not present

## 2024-05-23 DIAGNOSIS — R2681 Unsteadiness on feet: Secondary | ICD-10-CM | POA: Diagnosis not present

## 2024-05-23 DIAGNOSIS — M6281 Muscle weakness (generalized): Secondary | ICD-10-CM | POA: Diagnosis not present

## 2024-05-23 DIAGNOSIS — I480 Paroxysmal atrial fibrillation: Secondary | ICD-10-CM | POA: Diagnosis not present

## 2024-05-23 DIAGNOSIS — J45909 Unspecified asthma, uncomplicated: Secondary | ICD-10-CM | POA: Diagnosis not present

## 2024-05-23 DIAGNOSIS — G40919 Epilepsy, unspecified, intractable, without status epilepticus: Secondary | ICD-10-CM | POA: Diagnosis not present

## 2024-05-23 DIAGNOSIS — I69391 Dysphagia following cerebral infarction: Secondary | ICD-10-CM | POA: Diagnosis not present

## 2024-05-23 DIAGNOSIS — R1312 Dysphagia, oropharyngeal phase: Secondary | ICD-10-CM | POA: Diagnosis not present

## 2024-05-23 DIAGNOSIS — I69354 Hemiplegia and hemiparesis following cerebral infarction affecting left non-dominant side: Secondary | ICD-10-CM | POA: Diagnosis not present

## 2024-05-27 DIAGNOSIS — M6281 Muscle weakness (generalized): Secondary | ICD-10-CM | POA: Diagnosis not present

## 2024-05-27 DIAGNOSIS — I480 Paroxysmal atrial fibrillation: Secondary | ICD-10-CM | POA: Diagnosis not present

## 2024-05-27 DIAGNOSIS — G40919 Epilepsy, unspecified, intractable, without status epilepticus: Secondary | ICD-10-CM | POA: Diagnosis not present

## 2024-05-27 DIAGNOSIS — R1312 Dysphagia, oropharyngeal phase: Secondary | ICD-10-CM | POA: Diagnosis not present

## 2024-05-27 DIAGNOSIS — R2681 Unsteadiness on feet: Secondary | ICD-10-CM | POA: Diagnosis not present

## 2024-05-27 DIAGNOSIS — I69354 Hemiplegia and hemiparesis following cerebral infarction affecting left non-dominant side: Secondary | ICD-10-CM | POA: Diagnosis not present

## 2024-05-27 DIAGNOSIS — J45909 Unspecified asthma, uncomplicated: Secondary | ICD-10-CM | POA: Diagnosis not present

## 2024-05-27 DIAGNOSIS — I69391 Dysphagia following cerebral infarction: Secondary | ICD-10-CM | POA: Diagnosis not present

## 2024-05-28 ENCOUNTER — Ambulatory Visit: Payer: Self-pay | Admitting: Adult Health

## 2024-05-29 DIAGNOSIS — I69391 Dysphagia following cerebral infarction: Secondary | ICD-10-CM | POA: Diagnosis not present

## 2024-05-29 DIAGNOSIS — M6281 Muscle weakness (generalized): Secondary | ICD-10-CM | POA: Diagnosis not present

## 2024-05-29 DIAGNOSIS — I69354 Hemiplegia and hemiparesis following cerebral infarction affecting left non-dominant side: Secondary | ICD-10-CM | POA: Diagnosis not present

## 2024-05-29 DIAGNOSIS — R1312 Dysphagia, oropharyngeal phase: Secondary | ICD-10-CM | POA: Diagnosis not present

## 2024-05-30 DIAGNOSIS — I69391 Dysphagia following cerebral infarction: Secondary | ICD-10-CM | POA: Diagnosis not present

## 2024-05-30 DIAGNOSIS — R1312 Dysphagia, oropharyngeal phase: Secondary | ICD-10-CM | POA: Diagnosis not present

## 2024-05-30 DIAGNOSIS — M6281 Muscle weakness (generalized): Secondary | ICD-10-CM | POA: Diagnosis not present

## 2024-05-30 DIAGNOSIS — I69354 Hemiplegia and hemiparesis following cerebral infarction affecting left non-dominant side: Secondary | ICD-10-CM | POA: Diagnosis not present

## 2024-05-31 DIAGNOSIS — I69391 Dysphagia following cerebral infarction: Secondary | ICD-10-CM | POA: Diagnosis not present

## 2024-05-31 DIAGNOSIS — I69354 Hemiplegia and hemiparesis following cerebral infarction affecting left non-dominant side: Secondary | ICD-10-CM | POA: Diagnosis not present

## 2024-05-31 DIAGNOSIS — R1312 Dysphagia, oropharyngeal phase: Secondary | ICD-10-CM | POA: Diagnosis not present

## 2024-06-01 DIAGNOSIS — R1312 Dysphagia, oropharyngeal phase: Secondary | ICD-10-CM | POA: Diagnosis not present

## 2024-06-01 DIAGNOSIS — I69391 Dysphagia following cerebral infarction: Secondary | ICD-10-CM | POA: Diagnosis not present

## 2024-06-01 DIAGNOSIS — I69354 Hemiplegia and hemiparesis following cerebral infarction affecting left non-dominant side: Secondary | ICD-10-CM | POA: Diagnosis not present

## 2024-06-04 ENCOUNTER — Telehealth: Payer: Self-pay

## 2024-06-04 DIAGNOSIS — I69391 Dysphagia following cerebral infarction: Secondary | ICD-10-CM | POA: Diagnosis not present

## 2024-06-04 DIAGNOSIS — E119 Type 2 diabetes mellitus without complications: Secondary | ICD-10-CM | POA: Diagnosis not present

## 2024-06-04 DIAGNOSIS — I10 Essential (primary) hypertension: Secondary | ICD-10-CM | POA: Diagnosis not present

## 2024-06-04 DIAGNOSIS — G40909 Epilepsy, unspecified, not intractable, without status epilepticus: Secondary | ICD-10-CM | POA: Diagnosis not present

## 2024-06-04 DIAGNOSIS — I69354 Hemiplegia and hemiparesis following cerebral infarction affecting left non-dominant side: Secondary | ICD-10-CM | POA: Diagnosis not present

## 2024-06-04 DIAGNOSIS — I4891 Unspecified atrial fibrillation: Secondary | ICD-10-CM | POA: Diagnosis not present

## 2024-06-04 DIAGNOSIS — R1312 Dysphagia, oropharyngeal phase: Secondary | ICD-10-CM | POA: Diagnosis not present

## 2024-06-04 NOTE — Telephone Encounter (Signed)
 Call to [facility], spoke with Orville RN, review EEGg results and asked to have updated medication list  to our office via fax , attn POD 3. She was in agreement.   When medication list received, please update epic and allow jesscia to review.

## 2024-06-25 DIAGNOSIS — I10 Essential (primary) hypertension: Secondary | ICD-10-CM | POA: Diagnosis not present

## 2024-06-25 DIAGNOSIS — G40909 Epilepsy, unspecified, not intractable, without status epilepticus: Secondary | ICD-10-CM | POA: Diagnosis not present

## 2024-06-25 DIAGNOSIS — I4891 Unspecified atrial fibrillation: Secondary | ICD-10-CM | POA: Diagnosis not present

## 2024-06-25 DIAGNOSIS — E119 Type 2 diabetes mellitus without complications: Secondary | ICD-10-CM | POA: Diagnosis not present

## 2024-07-04 DIAGNOSIS — I4891 Unspecified atrial fibrillation: Secondary | ICD-10-CM | POA: Diagnosis not present

## 2024-07-04 DIAGNOSIS — G40909 Epilepsy, unspecified, not intractable, without status epilepticus: Secondary | ICD-10-CM | POA: Diagnosis not present

## 2024-07-11 DIAGNOSIS — I639 Cerebral infarction, unspecified: Secondary | ICD-10-CM | POA: Diagnosis not present

## 2024-07-11 DIAGNOSIS — E119 Type 2 diabetes mellitus without complications: Secondary | ICD-10-CM | POA: Diagnosis not present

## 2024-07-11 DIAGNOSIS — I10 Essential (primary) hypertension: Secondary | ICD-10-CM | POA: Diagnosis not present

## 2024-07-11 DIAGNOSIS — I4891 Unspecified atrial fibrillation: Secondary | ICD-10-CM | POA: Diagnosis not present

## 2024-07-11 DIAGNOSIS — G40909 Epilepsy, unspecified, not intractable, without status epilepticus: Secondary | ICD-10-CM | POA: Diagnosis not present

## 2024-07-16 DIAGNOSIS — I4891 Unspecified atrial fibrillation: Secondary | ICD-10-CM | POA: Diagnosis not present

## 2024-07-16 DIAGNOSIS — G40909 Epilepsy, unspecified, not intractable, without status epilepticus: Secondary | ICD-10-CM | POA: Diagnosis not present

## 2024-07-16 DIAGNOSIS — I639 Cerebral infarction, unspecified: Secondary | ICD-10-CM | POA: Diagnosis not present

## 2024-07-18 DIAGNOSIS — E785 Hyperlipidemia, unspecified: Secondary | ICD-10-CM | POA: Diagnosis not present

## 2024-07-18 DIAGNOSIS — I69354 Hemiplegia and hemiparesis following cerebral infarction affecting left non-dominant side: Secondary | ICD-10-CM | POA: Diagnosis not present

## 2024-07-18 DIAGNOSIS — E1165 Type 2 diabetes mellitus with hyperglycemia: Secondary | ICD-10-CM | POA: Diagnosis not present

## 2024-07-18 DIAGNOSIS — M6281 Muscle weakness (generalized): Secondary | ICD-10-CM | POA: Diagnosis not present

## 2024-07-18 DIAGNOSIS — R1311 Dysphagia, oral phase: Secondary | ICD-10-CM | POA: Diagnosis not present

## 2024-07-18 DIAGNOSIS — I69318 Other symptoms and signs involving cognitive functions following cerebral infarction: Secondary | ICD-10-CM | POA: Diagnosis not present

## 2024-07-18 DIAGNOSIS — I69391 Dysphagia following cerebral infarction: Secondary | ICD-10-CM | POA: Diagnosis not present

## 2024-07-18 DIAGNOSIS — R41841 Cognitive communication deficit: Secondary | ICD-10-CM | POA: Diagnosis not present

## 2024-07-20 DIAGNOSIS — I69391 Dysphagia following cerebral infarction: Secondary | ICD-10-CM | POA: Diagnosis not present

## 2024-07-20 DIAGNOSIS — R41841 Cognitive communication deficit: Secondary | ICD-10-CM | POA: Diagnosis not present

## 2024-07-20 DIAGNOSIS — E1165 Type 2 diabetes mellitus with hyperglycemia: Secondary | ICD-10-CM | POA: Diagnosis not present

## 2024-07-20 DIAGNOSIS — R1311 Dysphagia, oral phase: Secondary | ICD-10-CM | POA: Diagnosis not present

## 2024-07-20 DIAGNOSIS — I69318 Other symptoms and signs involving cognitive functions following cerebral infarction: Secondary | ICD-10-CM | POA: Diagnosis not present

## 2024-07-20 DIAGNOSIS — M6281 Muscle weakness (generalized): Secondary | ICD-10-CM | POA: Diagnosis not present

## 2024-07-20 DIAGNOSIS — I69354 Hemiplegia and hemiparesis following cerebral infarction affecting left non-dominant side: Secondary | ICD-10-CM | POA: Diagnosis not present

## 2024-07-21 DIAGNOSIS — M6281 Muscle weakness (generalized): Secondary | ICD-10-CM | POA: Diagnosis not present

## 2024-07-21 DIAGNOSIS — I69318 Other symptoms and signs involving cognitive functions following cerebral infarction: Secondary | ICD-10-CM | POA: Diagnosis not present

## 2024-07-21 DIAGNOSIS — R41841 Cognitive communication deficit: Secondary | ICD-10-CM | POA: Diagnosis not present

## 2024-07-21 DIAGNOSIS — R1311 Dysphagia, oral phase: Secondary | ICD-10-CM | POA: Diagnosis not present

## 2024-07-21 DIAGNOSIS — E1165 Type 2 diabetes mellitus with hyperglycemia: Secondary | ICD-10-CM | POA: Diagnosis not present

## 2024-07-21 DIAGNOSIS — I69354 Hemiplegia and hemiparesis following cerebral infarction affecting left non-dominant side: Secondary | ICD-10-CM | POA: Diagnosis not present

## 2024-07-21 DIAGNOSIS — I69391 Dysphagia following cerebral infarction: Secondary | ICD-10-CM | POA: Diagnosis not present

## 2024-07-22 DIAGNOSIS — E1165 Type 2 diabetes mellitus with hyperglycemia: Secondary | ICD-10-CM | POA: Diagnosis not present

## 2024-07-22 DIAGNOSIS — I69318 Other symptoms and signs involving cognitive functions following cerebral infarction: Secondary | ICD-10-CM | POA: Diagnosis not present

## 2024-07-22 DIAGNOSIS — R41841 Cognitive communication deficit: Secondary | ICD-10-CM | POA: Diagnosis not present

## 2024-07-22 DIAGNOSIS — I69354 Hemiplegia and hemiparesis following cerebral infarction affecting left non-dominant side: Secondary | ICD-10-CM | POA: Diagnosis not present

## 2024-07-22 DIAGNOSIS — I69391 Dysphagia following cerebral infarction: Secondary | ICD-10-CM | POA: Diagnosis not present

## 2024-07-22 DIAGNOSIS — M6281 Muscle weakness (generalized): Secondary | ICD-10-CM | POA: Diagnosis not present

## 2024-07-22 DIAGNOSIS — R1311 Dysphagia, oral phase: Secondary | ICD-10-CM | POA: Diagnosis not present

## 2024-07-23 DIAGNOSIS — E785 Hyperlipidemia, unspecified: Secondary | ICD-10-CM | POA: Diagnosis not present

## 2024-07-23 DIAGNOSIS — E1165 Type 2 diabetes mellitus with hyperglycemia: Secondary | ICD-10-CM | POA: Diagnosis not present

## 2024-07-24 ENCOUNTER — Telehealth: Payer: Self-pay

## 2024-07-24 DIAGNOSIS — I69354 Hemiplegia and hemiparesis following cerebral infarction affecting left non-dominant side: Secondary | ICD-10-CM | POA: Diagnosis not present

## 2024-07-24 DIAGNOSIS — I69318 Other symptoms and signs involving cognitive functions following cerebral infarction: Secondary | ICD-10-CM | POA: Diagnosis not present

## 2024-07-24 DIAGNOSIS — E1165 Type 2 diabetes mellitus with hyperglycemia: Secondary | ICD-10-CM | POA: Diagnosis not present

## 2024-07-24 DIAGNOSIS — M6281 Muscle weakness (generalized): Secondary | ICD-10-CM | POA: Diagnosis not present

## 2024-07-24 DIAGNOSIS — I69391 Dysphagia following cerebral infarction: Secondary | ICD-10-CM | POA: Diagnosis not present

## 2024-07-24 DIAGNOSIS — R41841 Cognitive communication deficit: Secondary | ICD-10-CM | POA: Diagnosis not present

## 2024-07-24 DIAGNOSIS — R1311 Dysphagia, oral phase: Secondary | ICD-10-CM | POA: Diagnosis not present

## 2024-07-24 DIAGNOSIS — R600 Localized edema: Secondary | ICD-10-CM | POA: Diagnosis not present

## 2024-07-24 NOTE — Telephone Encounter (Signed)
 Patient was identified as falling into the [place] Measure - Diabetes.   Patient was: Patient is not currently using our practice.

## 2024-07-25 DIAGNOSIS — I480 Paroxysmal atrial fibrillation: Secondary | ICD-10-CM | POA: Diagnosis not present

## 2024-07-26 DIAGNOSIS — I69354 Hemiplegia and hemiparesis following cerebral infarction affecting left non-dominant side: Secondary | ICD-10-CM | POA: Diagnosis not present

## 2024-07-26 DIAGNOSIS — E1165 Type 2 diabetes mellitus with hyperglycemia: Secondary | ICD-10-CM | POA: Diagnosis not present

## 2024-07-26 DIAGNOSIS — R41841 Cognitive communication deficit: Secondary | ICD-10-CM | POA: Diagnosis not present

## 2024-07-26 DIAGNOSIS — I69318 Other symptoms and signs involving cognitive functions following cerebral infarction: Secondary | ICD-10-CM | POA: Diagnosis not present

## 2024-07-26 DIAGNOSIS — I69391 Dysphagia following cerebral infarction: Secondary | ICD-10-CM | POA: Diagnosis not present

## 2024-07-26 DIAGNOSIS — R1311 Dysphagia, oral phase: Secondary | ICD-10-CM | POA: Diagnosis not present

## 2024-07-26 DIAGNOSIS — M6281 Muscle weakness (generalized): Secondary | ICD-10-CM | POA: Diagnosis not present

## 2024-07-27 DIAGNOSIS — M6281 Muscle weakness (generalized): Secondary | ICD-10-CM | POA: Diagnosis not present

## 2024-07-27 DIAGNOSIS — R1311 Dysphagia, oral phase: Secondary | ICD-10-CM | POA: Diagnosis not present

## 2024-07-27 DIAGNOSIS — R41841 Cognitive communication deficit: Secondary | ICD-10-CM | POA: Diagnosis not present

## 2024-07-27 DIAGNOSIS — I69354 Hemiplegia and hemiparesis following cerebral infarction affecting left non-dominant side: Secondary | ICD-10-CM | POA: Diagnosis not present

## 2024-07-27 DIAGNOSIS — I69391 Dysphagia following cerebral infarction: Secondary | ICD-10-CM | POA: Diagnosis not present

## 2024-07-27 DIAGNOSIS — E1165 Type 2 diabetes mellitus with hyperglycemia: Secondary | ICD-10-CM | POA: Diagnosis not present

## 2024-07-27 DIAGNOSIS — I69318 Other symptoms and signs involving cognitive functions following cerebral infarction: Secondary | ICD-10-CM | POA: Diagnosis not present

## 2024-07-29 DIAGNOSIS — M6281 Muscle weakness (generalized): Secondary | ICD-10-CM | POA: Diagnosis not present

## 2024-07-29 DIAGNOSIS — I69354 Hemiplegia and hemiparesis following cerebral infarction affecting left non-dominant side: Secondary | ICD-10-CM | POA: Diagnosis not present

## 2024-07-29 DIAGNOSIS — R1311 Dysphagia, oral phase: Secondary | ICD-10-CM | POA: Diagnosis not present

## 2024-07-29 DIAGNOSIS — E1165 Type 2 diabetes mellitus with hyperglycemia: Secondary | ICD-10-CM | POA: Diagnosis not present

## 2024-07-29 DIAGNOSIS — I69391 Dysphagia following cerebral infarction: Secondary | ICD-10-CM | POA: Diagnosis not present

## 2024-07-29 DIAGNOSIS — R41841 Cognitive communication deficit: Secondary | ICD-10-CM | POA: Diagnosis not present

## 2024-07-29 DIAGNOSIS — I69318 Other symptoms and signs involving cognitive functions following cerebral infarction: Secondary | ICD-10-CM | POA: Diagnosis not present

## 2024-07-30 DIAGNOSIS — I69354 Hemiplegia and hemiparesis following cerebral infarction affecting left non-dominant side: Secondary | ICD-10-CM | POA: Diagnosis not present

## 2024-07-30 DIAGNOSIS — M6281 Muscle weakness (generalized): Secondary | ICD-10-CM | POA: Diagnosis not present

## 2024-07-30 DIAGNOSIS — H25812 Combined forms of age-related cataract, left eye: Secondary | ICD-10-CM | POA: Diagnosis not present

## 2024-07-30 DIAGNOSIS — E1165 Type 2 diabetes mellitus with hyperglycemia: Secondary | ICD-10-CM | POA: Diagnosis not present

## 2024-07-30 DIAGNOSIS — I69318 Other symptoms and signs involving cognitive functions following cerebral infarction: Secondary | ICD-10-CM | POA: Diagnosis not present

## 2024-07-30 DIAGNOSIS — H35011 Changes in retinal vascular appearance, right eye: Secondary | ICD-10-CM | POA: Diagnosis not present

## 2024-07-30 DIAGNOSIS — H26491 Other secondary cataract, right eye: Secondary | ICD-10-CM | POA: Diagnosis not present

## 2024-07-30 DIAGNOSIS — R41841 Cognitive communication deficit: Secondary | ICD-10-CM | POA: Diagnosis not present

## 2024-07-30 DIAGNOSIS — R1311 Dysphagia, oral phase: Secondary | ICD-10-CM | POA: Diagnosis not present

## 2024-07-30 DIAGNOSIS — G894 Chronic pain syndrome: Secondary | ICD-10-CM | POA: Diagnosis not present

## 2024-07-30 DIAGNOSIS — I69391 Dysphagia following cerebral infarction: Secondary | ICD-10-CM | POA: Diagnosis not present

## 2024-07-31 DIAGNOSIS — M6281 Muscle weakness (generalized): Secondary | ICD-10-CM | POA: Diagnosis not present

## 2024-07-31 DIAGNOSIS — R2689 Other abnormalities of gait and mobility: Secondary | ICD-10-CM | POA: Diagnosis not present

## 2024-07-31 DIAGNOSIS — I69354 Hemiplegia and hemiparesis following cerebral infarction affecting left non-dominant side: Secondary | ICD-10-CM | POA: Diagnosis not present

## 2024-07-31 DIAGNOSIS — R41841 Cognitive communication deficit: Secondary | ICD-10-CM | POA: Diagnosis not present

## 2024-07-31 DIAGNOSIS — I69391 Dysphagia following cerebral infarction: Secondary | ICD-10-CM | POA: Diagnosis not present

## 2024-07-31 DIAGNOSIS — R1311 Dysphagia, oral phase: Secondary | ICD-10-CM | POA: Diagnosis not present

## 2024-07-31 DIAGNOSIS — E1165 Type 2 diabetes mellitus with hyperglycemia: Secondary | ICD-10-CM | POA: Diagnosis not present

## 2024-07-31 DIAGNOSIS — I69318 Other symptoms and signs involving cognitive functions following cerebral infarction: Secondary | ICD-10-CM | POA: Diagnosis not present

## 2024-08-01 DIAGNOSIS — R2689 Other abnormalities of gait and mobility: Secondary | ICD-10-CM | POA: Diagnosis not present

## 2024-08-01 DIAGNOSIS — R1311 Dysphagia, oral phase: Secondary | ICD-10-CM | POA: Diagnosis not present

## 2024-08-01 DIAGNOSIS — I69354 Hemiplegia and hemiparesis following cerebral infarction affecting left non-dominant side: Secondary | ICD-10-CM | POA: Diagnosis not present

## 2024-08-01 DIAGNOSIS — I69391 Dysphagia following cerebral infarction: Secondary | ICD-10-CM | POA: Diagnosis not present

## 2024-08-01 DIAGNOSIS — I69318 Other symptoms and signs involving cognitive functions following cerebral infarction: Secondary | ICD-10-CM | POA: Diagnosis not present

## 2024-08-01 DIAGNOSIS — E1165 Type 2 diabetes mellitus with hyperglycemia: Secondary | ICD-10-CM | POA: Diagnosis not present

## 2024-08-01 DIAGNOSIS — R41841 Cognitive communication deficit: Secondary | ICD-10-CM | POA: Diagnosis not present

## 2024-08-02 DIAGNOSIS — I69391 Dysphagia following cerebral infarction: Secondary | ICD-10-CM | POA: Diagnosis not present

## 2024-08-02 DIAGNOSIS — I69318 Other symptoms and signs involving cognitive functions following cerebral infarction: Secondary | ICD-10-CM | POA: Diagnosis not present

## 2024-08-02 DIAGNOSIS — R41841 Cognitive communication deficit: Secondary | ICD-10-CM | POA: Diagnosis not present

## 2024-08-02 DIAGNOSIS — E1165 Type 2 diabetes mellitus with hyperglycemia: Secondary | ICD-10-CM | POA: Diagnosis not present

## 2024-08-02 DIAGNOSIS — R1311 Dysphagia, oral phase: Secondary | ICD-10-CM | POA: Diagnosis not present

## 2024-08-02 DIAGNOSIS — I69354 Hemiplegia and hemiparesis following cerebral infarction affecting left non-dominant side: Secondary | ICD-10-CM | POA: Diagnosis not present

## 2024-08-02 DIAGNOSIS — R2689 Other abnormalities of gait and mobility: Secondary | ICD-10-CM | POA: Diagnosis not present

## 2024-08-03 DIAGNOSIS — I69391 Dysphagia following cerebral infarction: Secondary | ICD-10-CM | POA: Diagnosis not present

## 2024-08-03 DIAGNOSIS — E1165 Type 2 diabetes mellitus with hyperglycemia: Secondary | ICD-10-CM | POA: Diagnosis not present

## 2024-08-03 DIAGNOSIS — R2689 Other abnormalities of gait and mobility: Secondary | ICD-10-CM | POA: Diagnosis not present

## 2024-08-03 DIAGNOSIS — R41841 Cognitive communication deficit: Secondary | ICD-10-CM | POA: Diagnosis not present

## 2024-08-03 DIAGNOSIS — I69318 Other symptoms and signs involving cognitive functions following cerebral infarction: Secondary | ICD-10-CM | POA: Diagnosis not present

## 2024-08-03 DIAGNOSIS — R1311 Dysphagia, oral phase: Secondary | ICD-10-CM | POA: Diagnosis not present

## 2024-08-03 DIAGNOSIS — I69354 Hemiplegia and hemiparesis following cerebral infarction affecting left non-dominant side: Secondary | ICD-10-CM | POA: Diagnosis not present

## 2024-08-05 DIAGNOSIS — M6281 Muscle weakness (generalized): Secondary | ICD-10-CM | POA: Diagnosis not present

## 2024-08-05 DIAGNOSIS — I69391 Dysphagia following cerebral infarction: Secondary | ICD-10-CM | POA: Diagnosis not present

## 2024-08-05 DIAGNOSIS — R2689 Other abnormalities of gait and mobility: Secondary | ICD-10-CM | POA: Diagnosis not present

## 2024-08-05 DIAGNOSIS — R2681 Unsteadiness on feet: Secondary | ICD-10-CM | POA: Diagnosis not present

## 2024-08-05 DIAGNOSIS — I480 Paroxysmal atrial fibrillation: Secondary | ICD-10-CM | POA: Diagnosis not present

## 2024-08-05 DIAGNOSIS — R1311 Dysphagia, oral phase: Secondary | ICD-10-CM | POA: Diagnosis not present

## 2024-08-05 DIAGNOSIS — E1165 Type 2 diabetes mellitus with hyperglycemia: Secondary | ICD-10-CM | POA: Diagnosis not present

## 2024-08-05 DIAGNOSIS — G40919 Epilepsy, unspecified, intractable, without status epilepticus: Secondary | ICD-10-CM | POA: Diagnosis not present

## 2024-08-05 DIAGNOSIS — I69318 Other symptoms and signs involving cognitive functions following cerebral infarction: Secondary | ICD-10-CM | POA: Diagnosis not present

## 2024-08-05 DIAGNOSIS — J45909 Unspecified asthma, uncomplicated: Secondary | ICD-10-CM | POA: Diagnosis not present

## 2024-08-05 DIAGNOSIS — J449 Chronic obstructive pulmonary disease, unspecified: Secondary | ICD-10-CM | POA: Diagnosis not present

## 2024-08-05 DIAGNOSIS — R41841 Cognitive communication deficit: Secondary | ICD-10-CM | POA: Diagnosis not present

## 2024-08-05 DIAGNOSIS — I69354 Hemiplegia and hemiparesis following cerebral infarction affecting left non-dominant side: Secondary | ICD-10-CM | POA: Diagnosis not present

## 2024-08-06 DIAGNOSIS — I69318 Other symptoms and signs involving cognitive functions following cerebral infarction: Secondary | ICD-10-CM | POA: Diagnosis not present

## 2024-08-06 DIAGNOSIS — E1165 Type 2 diabetes mellitus with hyperglycemia: Secondary | ICD-10-CM | POA: Diagnosis not present

## 2024-08-06 DIAGNOSIS — R1311 Dysphagia, oral phase: Secondary | ICD-10-CM | POA: Diagnosis not present

## 2024-08-06 DIAGNOSIS — R2689 Other abnormalities of gait and mobility: Secondary | ICD-10-CM | POA: Diagnosis not present

## 2024-08-06 DIAGNOSIS — R600 Localized edema: Secondary | ICD-10-CM | POA: Diagnosis not present

## 2024-08-06 DIAGNOSIS — R41841 Cognitive communication deficit: Secondary | ICD-10-CM | POA: Diagnosis not present

## 2024-08-06 DIAGNOSIS — I69354 Hemiplegia and hemiparesis following cerebral infarction affecting left non-dominant side: Secondary | ICD-10-CM | POA: Diagnosis not present

## 2024-08-06 DIAGNOSIS — I69391 Dysphagia following cerebral infarction: Secondary | ICD-10-CM | POA: Diagnosis not present

## 2024-08-07 DIAGNOSIS — E1165 Type 2 diabetes mellitus with hyperglycemia: Secondary | ICD-10-CM | POA: Diagnosis not present

## 2024-08-07 DIAGNOSIS — I69354 Hemiplegia and hemiparesis following cerebral infarction affecting left non-dominant side: Secondary | ICD-10-CM | POA: Diagnosis not present

## 2024-08-07 DIAGNOSIS — I69318 Other symptoms and signs involving cognitive functions following cerebral infarction: Secondary | ICD-10-CM | POA: Diagnosis not present

## 2024-08-07 DIAGNOSIS — R41841 Cognitive communication deficit: Secondary | ICD-10-CM | POA: Diagnosis not present

## 2024-08-07 DIAGNOSIS — R1311 Dysphagia, oral phase: Secondary | ICD-10-CM | POA: Diagnosis not present

## 2024-08-07 DIAGNOSIS — R2689 Other abnormalities of gait and mobility: Secondary | ICD-10-CM | POA: Diagnosis not present

## 2024-08-07 DIAGNOSIS — I69391 Dysphagia following cerebral infarction: Secondary | ICD-10-CM | POA: Diagnosis not present

## 2024-08-08 DIAGNOSIS — I69318 Other symptoms and signs involving cognitive functions following cerebral infarction: Secondary | ICD-10-CM | POA: Diagnosis not present

## 2024-08-08 DIAGNOSIS — R41841 Cognitive communication deficit: Secondary | ICD-10-CM | POA: Diagnosis not present

## 2024-08-08 DIAGNOSIS — R2689 Other abnormalities of gait and mobility: Secondary | ICD-10-CM | POA: Diagnosis not present

## 2024-08-08 DIAGNOSIS — R1311 Dysphagia, oral phase: Secondary | ICD-10-CM | POA: Diagnosis not present

## 2024-08-08 DIAGNOSIS — I69354 Hemiplegia and hemiparesis following cerebral infarction affecting left non-dominant side: Secondary | ICD-10-CM | POA: Diagnosis not present

## 2024-08-08 DIAGNOSIS — E1165 Type 2 diabetes mellitus with hyperglycemia: Secondary | ICD-10-CM | POA: Diagnosis not present

## 2024-08-08 DIAGNOSIS — I69391 Dysphagia following cerebral infarction: Secondary | ICD-10-CM | POA: Diagnosis not present

## 2024-08-09 DIAGNOSIS — I69354 Hemiplegia and hemiparesis following cerebral infarction affecting left non-dominant side: Secondary | ICD-10-CM | POA: Diagnosis not present

## 2024-08-09 DIAGNOSIS — I69391 Dysphagia following cerebral infarction: Secondary | ICD-10-CM | POA: Diagnosis not present

## 2024-08-09 DIAGNOSIS — R1311 Dysphagia, oral phase: Secondary | ICD-10-CM | POA: Diagnosis not present

## 2024-08-09 DIAGNOSIS — I69318 Other symptoms and signs involving cognitive functions following cerebral infarction: Secondary | ICD-10-CM | POA: Diagnosis not present

## 2024-08-09 DIAGNOSIS — R41841 Cognitive communication deficit: Secondary | ICD-10-CM | POA: Diagnosis not present

## 2024-08-09 DIAGNOSIS — E1165 Type 2 diabetes mellitus with hyperglycemia: Secondary | ICD-10-CM | POA: Diagnosis not present

## 2024-08-09 DIAGNOSIS — R2689 Other abnormalities of gait and mobility: Secondary | ICD-10-CM | POA: Diagnosis not present

## 2024-08-12 DIAGNOSIS — I69318 Other symptoms and signs involving cognitive functions following cerebral infarction: Secondary | ICD-10-CM | POA: Diagnosis not present

## 2024-08-12 DIAGNOSIS — G40919 Epilepsy, unspecified, intractable, without status epilepticus: Secondary | ICD-10-CM | POA: Diagnosis not present

## 2024-08-12 DIAGNOSIS — I69391 Dysphagia following cerebral infarction: Secondary | ICD-10-CM | POA: Diagnosis not present

## 2024-08-12 DIAGNOSIS — R2689 Other abnormalities of gait and mobility: Secondary | ICD-10-CM | POA: Diagnosis not present

## 2024-08-12 DIAGNOSIS — R1311 Dysphagia, oral phase: Secondary | ICD-10-CM | POA: Diagnosis not present

## 2024-08-12 DIAGNOSIS — I69354 Hemiplegia and hemiparesis following cerebral infarction affecting left non-dominant side: Secondary | ICD-10-CM | POA: Diagnosis not present

## 2024-08-12 DIAGNOSIS — J45909 Unspecified asthma, uncomplicated: Secondary | ICD-10-CM | POA: Diagnosis not present

## 2024-08-12 DIAGNOSIS — R41841 Cognitive communication deficit: Secondary | ICD-10-CM | POA: Diagnosis not present

## 2024-08-12 DIAGNOSIS — R2681 Unsteadiness on feet: Secondary | ICD-10-CM | POA: Diagnosis not present

## 2024-08-12 DIAGNOSIS — E1165 Type 2 diabetes mellitus with hyperglycemia: Secondary | ICD-10-CM | POA: Diagnosis not present

## 2024-08-12 DIAGNOSIS — J449 Chronic obstructive pulmonary disease, unspecified: Secondary | ICD-10-CM | POA: Diagnosis not present

## 2024-08-12 DIAGNOSIS — I480 Paroxysmal atrial fibrillation: Secondary | ICD-10-CM | POA: Diagnosis not present

## 2024-08-12 DIAGNOSIS — M6281 Muscle weakness (generalized): Secondary | ICD-10-CM | POA: Diagnosis not present

## 2024-08-13 DIAGNOSIS — I69354 Hemiplegia and hemiparesis following cerebral infarction affecting left non-dominant side: Secondary | ICD-10-CM | POA: Diagnosis not present

## 2024-08-13 DIAGNOSIS — R41841 Cognitive communication deficit: Secondary | ICD-10-CM | POA: Diagnosis not present

## 2024-08-13 DIAGNOSIS — I69318 Other symptoms and signs involving cognitive functions following cerebral infarction: Secondary | ICD-10-CM | POA: Diagnosis not present

## 2024-08-13 DIAGNOSIS — I69391 Dysphagia following cerebral infarction: Secondary | ICD-10-CM | POA: Diagnosis not present

## 2024-08-13 DIAGNOSIS — E1165 Type 2 diabetes mellitus with hyperglycemia: Secondary | ICD-10-CM | POA: Diagnosis not present

## 2024-08-13 DIAGNOSIS — R1311 Dysphagia, oral phase: Secondary | ICD-10-CM | POA: Diagnosis not present

## 2024-08-13 DIAGNOSIS — R2689 Other abnormalities of gait and mobility: Secondary | ICD-10-CM | POA: Diagnosis not present

## 2024-08-14 DIAGNOSIS — E1165 Type 2 diabetes mellitus with hyperglycemia: Secondary | ICD-10-CM | POA: Diagnosis not present

## 2024-08-14 DIAGNOSIS — R1311 Dysphagia, oral phase: Secondary | ICD-10-CM | POA: Diagnosis not present

## 2024-08-14 DIAGNOSIS — I69354 Hemiplegia and hemiparesis following cerebral infarction affecting left non-dominant side: Secondary | ICD-10-CM | POA: Diagnosis not present

## 2024-08-14 DIAGNOSIS — R2689 Other abnormalities of gait and mobility: Secondary | ICD-10-CM | POA: Diagnosis not present

## 2024-08-14 DIAGNOSIS — I69318 Other symptoms and signs involving cognitive functions following cerebral infarction: Secondary | ICD-10-CM | POA: Diagnosis not present

## 2024-08-14 DIAGNOSIS — R41841 Cognitive communication deficit: Secondary | ICD-10-CM | POA: Diagnosis not present

## 2024-08-14 DIAGNOSIS — I69391 Dysphagia following cerebral infarction: Secondary | ICD-10-CM | POA: Diagnosis not present

## 2024-08-15 DIAGNOSIS — I69354 Hemiplegia and hemiparesis following cerebral infarction affecting left non-dominant side: Secondary | ICD-10-CM | POA: Diagnosis not present

## 2024-08-15 DIAGNOSIS — E1165 Type 2 diabetes mellitus with hyperglycemia: Secondary | ICD-10-CM | POA: Diagnosis not present

## 2024-08-15 DIAGNOSIS — R41841 Cognitive communication deficit: Secondary | ICD-10-CM | POA: Diagnosis not present

## 2024-08-15 DIAGNOSIS — R1311 Dysphagia, oral phase: Secondary | ICD-10-CM | POA: Diagnosis not present

## 2024-08-15 DIAGNOSIS — R2689 Other abnormalities of gait and mobility: Secondary | ICD-10-CM | POA: Diagnosis not present

## 2024-08-15 DIAGNOSIS — I69318 Other symptoms and signs involving cognitive functions following cerebral infarction: Secondary | ICD-10-CM | POA: Diagnosis not present

## 2024-08-15 DIAGNOSIS — I69391 Dysphagia following cerebral infarction: Secondary | ICD-10-CM | POA: Diagnosis not present

## 2024-08-16 DIAGNOSIS — R1311 Dysphagia, oral phase: Secondary | ICD-10-CM | POA: Diagnosis not present

## 2024-08-16 DIAGNOSIS — R2689 Other abnormalities of gait and mobility: Secondary | ICD-10-CM | POA: Diagnosis not present

## 2024-08-16 DIAGNOSIS — E1165 Type 2 diabetes mellitus with hyperglycemia: Secondary | ICD-10-CM | POA: Diagnosis not present

## 2024-08-16 DIAGNOSIS — R41841 Cognitive communication deficit: Secondary | ICD-10-CM | POA: Diagnosis not present

## 2024-08-16 DIAGNOSIS — I69318 Other symptoms and signs involving cognitive functions following cerebral infarction: Secondary | ICD-10-CM | POA: Diagnosis not present

## 2024-08-16 DIAGNOSIS — I69354 Hemiplegia and hemiparesis following cerebral infarction affecting left non-dominant side: Secondary | ICD-10-CM | POA: Diagnosis not present

## 2024-08-16 DIAGNOSIS — I69391 Dysphagia following cerebral infarction: Secondary | ICD-10-CM | POA: Diagnosis not present

## 2024-08-18 DIAGNOSIS — I69354 Hemiplegia and hemiparesis following cerebral infarction affecting left non-dominant side: Secondary | ICD-10-CM | POA: Diagnosis not present

## 2024-08-18 DIAGNOSIS — I69391 Dysphagia following cerebral infarction: Secondary | ICD-10-CM | POA: Diagnosis not present

## 2024-08-18 DIAGNOSIS — R1311 Dysphagia, oral phase: Secondary | ICD-10-CM | POA: Diagnosis not present

## 2024-08-18 DIAGNOSIS — E1165 Type 2 diabetes mellitus with hyperglycemia: Secondary | ICD-10-CM | POA: Diagnosis not present

## 2024-08-18 DIAGNOSIS — I69318 Other symptoms and signs involving cognitive functions following cerebral infarction: Secondary | ICD-10-CM | POA: Diagnosis not present

## 2024-08-18 DIAGNOSIS — R41841 Cognitive communication deficit: Secondary | ICD-10-CM | POA: Diagnosis not present

## 2024-08-18 DIAGNOSIS — R2689 Other abnormalities of gait and mobility: Secondary | ICD-10-CM | POA: Diagnosis not present

## 2024-08-19 DIAGNOSIS — R2689 Other abnormalities of gait and mobility: Secondary | ICD-10-CM | POA: Diagnosis not present

## 2024-08-19 DIAGNOSIS — G40919 Epilepsy, unspecified, intractable, without status epilepticus: Secondary | ICD-10-CM | POA: Diagnosis not present

## 2024-08-19 DIAGNOSIS — R41841 Cognitive communication deficit: Secondary | ICD-10-CM | POA: Diagnosis not present

## 2024-08-19 DIAGNOSIS — I69391 Dysphagia following cerebral infarction: Secondary | ICD-10-CM | POA: Diagnosis not present

## 2024-08-19 DIAGNOSIS — J449 Chronic obstructive pulmonary disease, unspecified: Secondary | ICD-10-CM | POA: Diagnosis not present

## 2024-08-19 DIAGNOSIS — R2681 Unsteadiness on feet: Secondary | ICD-10-CM | POA: Diagnosis not present

## 2024-08-19 DIAGNOSIS — J45909 Unspecified asthma, uncomplicated: Secondary | ICD-10-CM | POA: Diagnosis not present

## 2024-08-19 DIAGNOSIS — E1165 Type 2 diabetes mellitus with hyperglycemia: Secondary | ICD-10-CM | POA: Diagnosis not present

## 2024-08-19 DIAGNOSIS — R1311 Dysphagia, oral phase: Secondary | ICD-10-CM | POA: Diagnosis not present

## 2024-08-19 DIAGNOSIS — I480 Paroxysmal atrial fibrillation: Secondary | ICD-10-CM | POA: Diagnosis not present

## 2024-08-19 DIAGNOSIS — I69354 Hemiplegia and hemiparesis following cerebral infarction affecting left non-dominant side: Secondary | ICD-10-CM | POA: Diagnosis not present

## 2024-08-19 DIAGNOSIS — I69318 Other symptoms and signs involving cognitive functions following cerebral infarction: Secondary | ICD-10-CM | POA: Diagnosis not present

## 2024-08-19 DIAGNOSIS — M6281 Muscle weakness (generalized): Secondary | ICD-10-CM | POA: Diagnosis not present

## 2024-08-20 DIAGNOSIS — I69354 Hemiplegia and hemiparesis following cerebral infarction affecting left non-dominant side: Secondary | ICD-10-CM | POA: Diagnosis not present

## 2024-08-20 DIAGNOSIS — I69318 Other symptoms and signs involving cognitive functions following cerebral infarction: Secondary | ICD-10-CM | POA: Diagnosis not present

## 2024-08-20 DIAGNOSIS — R1311 Dysphagia, oral phase: Secondary | ICD-10-CM | POA: Diagnosis not present

## 2024-08-20 DIAGNOSIS — R41841 Cognitive communication deficit: Secondary | ICD-10-CM | POA: Diagnosis not present

## 2024-08-20 DIAGNOSIS — I69391 Dysphagia following cerebral infarction: Secondary | ICD-10-CM | POA: Diagnosis not present

## 2024-08-20 DIAGNOSIS — R2689 Other abnormalities of gait and mobility: Secondary | ICD-10-CM | POA: Diagnosis not present

## 2024-08-20 DIAGNOSIS — E1165 Type 2 diabetes mellitus with hyperglycemia: Secondary | ICD-10-CM | POA: Diagnosis not present

## 2024-08-21 DIAGNOSIS — E1165 Type 2 diabetes mellitus with hyperglycemia: Secondary | ICD-10-CM | POA: Diagnosis not present

## 2024-08-21 DIAGNOSIS — M6281 Muscle weakness (generalized): Secondary | ICD-10-CM | POA: Diagnosis not present

## 2024-08-21 DIAGNOSIS — R2689 Other abnormalities of gait and mobility: Secondary | ICD-10-CM | POA: Diagnosis not present

## 2024-08-21 DIAGNOSIS — I69354 Hemiplegia and hemiparesis following cerebral infarction affecting left non-dominant side: Secondary | ICD-10-CM | POA: Diagnosis not present

## 2024-08-21 DIAGNOSIS — I69318 Other symptoms and signs involving cognitive functions following cerebral infarction: Secondary | ICD-10-CM | POA: Diagnosis not present

## 2024-08-21 DIAGNOSIS — H903 Sensorineural hearing loss, bilateral: Secondary | ICD-10-CM | POA: Diagnosis not present

## 2024-08-21 DIAGNOSIS — R41841 Cognitive communication deficit: Secondary | ICD-10-CM | POA: Diagnosis not present

## 2024-08-21 DIAGNOSIS — R1311 Dysphagia, oral phase: Secondary | ICD-10-CM | POA: Diagnosis not present

## 2024-08-21 DIAGNOSIS — I69391 Dysphagia following cerebral infarction: Secondary | ICD-10-CM | POA: Diagnosis not present

## 2024-08-22 DIAGNOSIS — I69318 Other symptoms and signs involving cognitive functions following cerebral infarction: Secondary | ICD-10-CM | POA: Diagnosis not present

## 2024-08-22 DIAGNOSIS — I69354 Hemiplegia and hemiparesis following cerebral infarction affecting left non-dominant side: Secondary | ICD-10-CM | POA: Diagnosis not present

## 2024-08-22 DIAGNOSIS — E1165 Type 2 diabetes mellitus with hyperglycemia: Secondary | ICD-10-CM | POA: Diagnosis not present

## 2024-08-22 DIAGNOSIS — R1311 Dysphagia, oral phase: Secondary | ICD-10-CM | POA: Diagnosis not present

## 2024-08-22 DIAGNOSIS — R41841 Cognitive communication deficit: Secondary | ICD-10-CM | POA: Diagnosis not present

## 2024-08-22 DIAGNOSIS — R2689 Other abnormalities of gait and mobility: Secondary | ICD-10-CM | POA: Diagnosis not present

## 2024-08-22 DIAGNOSIS — M6281 Muscle weakness (generalized): Secondary | ICD-10-CM | POA: Diagnosis not present

## 2024-08-22 DIAGNOSIS — I69391 Dysphagia following cerebral infarction: Secondary | ICD-10-CM | POA: Diagnosis not present

## 2024-08-23 DIAGNOSIS — R41841 Cognitive communication deficit: Secondary | ICD-10-CM | POA: Diagnosis not present

## 2024-08-23 DIAGNOSIS — I69318 Other symptoms and signs involving cognitive functions following cerebral infarction: Secondary | ICD-10-CM | POA: Diagnosis not present

## 2024-08-23 DIAGNOSIS — I69391 Dysphagia following cerebral infarction: Secondary | ICD-10-CM | POA: Diagnosis not present

## 2024-08-23 DIAGNOSIS — I69354 Hemiplegia and hemiparesis following cerebral infarction affecting left non-dominant side: Secondary | ICD-10-CM | POA: Diagnosis not present

## 2024-08-23 DIAGNOSIS — R2689 Other abnormalities of gait and mobility: Secondary | ICD-10-CM | POA: Diagnosis not present

## 2024-08-23 DIAGNOSIS — R1311 Dysphagia, oral phase: Secondary | ICD-10-CM | POA: Diagnosis not present

## 2024-08-23 DIAGNOSIS — M6281 Muscle weakness (generalized): Secondary | ICD-10-CM | POA: Diagnosis not present

## 2024-08-23 DIAGNOSIS — E1165 Type 2 diabetes mellitus with hyperglycemia: Secondary | ICD-10-CM | POA: Diagnosis not present

## 2024-08-26 DIAGNOSIS — I69354 Hemiplegia and hemiparesis following cerebral infarction affecting left non-dominant side: Secondary | ICD-10-CM | POA: Diagnosis not present

## 2024-08-26 DIAGNOSIS — G40919 Epilepsy, unspecified, intractable, without status epilepticus: Secondary | ICD-10-CM | POA: Diagnosis not present

## 2024-08-26 DIAGNOSIS — I69391 Dysphagia following cerebral infarction: Secondary | ICD-10-CM | POA: Diagnosis not present

## 2024-08-26 DIAGNOSIS — R2681 Unsteadiness on feet: Secondary | ICD-10-CM | POA: Diagnosis not present

## 2024-08-26 DIAGNOSIS — E1165 Type 2 diabetes mellitus with hyperglycemia: Secondary | ICD-10-CM | POA: Diagnosis not present

## 2024-08-26 DIAGNOSIS — M6281 Muscle weakness (generalized): Secondary | ICD-10-CM | POA: Diagnosis not present

## 2024-08-26 DIAGNOSIS — R2689 Other abnormalities of gait and mobility: Secondary | ICD-10-CM | POA: Diagnosis not present

## 2024-08-26 DIAGNOSIS — J45909 Unspecified asthma, uncomplicated: Secondary | ICD-10-CM | POA: Diagnosis not present

## 2024-08-26 DIAGNOSIS — J449 Chronic obstructive pulmonary disease, unspecified: Secondary | ICD-10-CM | POA: Diagnosis not present

## 2024-08-26 DIAGNOSIS — R1311 Dysphagia, oral phase: Secondary | ICD-10-CM | POA: Diagnosis not present

## 2024-08-26 DIAGNOSIS — I69318 Other symptoms and signs involving cognitive functions following cerebral infarction: Secondary | ICD-10-CM | POA: Diagnosis not present

## 2024-08-26 DIAGNOSIS — R41841 Cognitive communication deficit: Secondary | ICD-10-CM | POA: Diagnosis not present

## 2024-08-26 DIAGNOSIS — I480 Paroxysmal atrial fibrillation: Secondary | ICD-10-CM | POA: Diagnosis not present

## 2024-08-27 DIAGNOSIS — R41841 Cognitive communication deficit: Secondary | ICD-10-CM | POA: Diagnosis not present

## 2024-08-27 DIAGNOSIS — R2689 Other abnormalities of gait and mobility: Secondary | ICD-10-CM | POA: Diagnosis not present

## 2024-08-27 DIAGNOSIS — E119 Type 2 diabetes mellitus without complications: Secondary | ICD-10-CM | POA: Diagnosis not present

## 2024-08-27 DIAGNOSIS — I69318 Other symptoms and signs involving cognitive functions following cerebral infarction: Secondary | ICD-10-CM | POA: Diagnosis not present

## 2024-08-27 DIAGNOSIS — R1311 Dysphagia, oral phase: Secondary | ICD-10-CM | POA: Diagnosis not present

## 2024-08-27 DIAGNOSIS — I69354 Hemiplegia and hemiparesis following cerebral infarction affecting left non-dominant side: Secondary | ICD-10-CM | POA: Diagnosis not present

## 2024-08-27 DIAGNOSIS — I10 Essential (primary) hypertension: Secondary | ICD-10-CM | POA: Diagnosis not present

## 2024-08-27 DIAGNOSIS — I69391 Dysphagia following cerebral infarction: Secondary | ICD-10-CM | POA: Diagnosis not present

## 2024-08-27 DIAGNOSIS — E1165 Type 2 diabetes mellitus with hyperglycemia: Secondary | ICD-10-CM | POA: Diagnosis not present

## 2024-08-27 DIAGNOSIS — M6281 Muscle weakness (generalized): Secondary | ICD-10-CM | POA: Diagnosis not present

## 2024-08-27 DIAGNOSIS — I639 Cerebral infarction, unspecified: Secondary | ICD-10-CM | POA: Diagnosis not present

## 2024-08-28 DIAGNOSIS — I69318 Other symptoms and signs involving cognitive functions following cerebral infarction: Secondary | ICD-10-CM | POA: Diagnosis not present

## 2024-08-28 DIAGNOSIS — E1165 Type 2 diabetes mellitus with hyperglycemia: Secondary | ICD-10-CM | POA: Diagnosis not present

## 2024-08-28 DIAGNOSIS — I69354 Hemiplegia and hemiparesis following cerebral infarction affecting left non-dominant side: Secondary | ICD-10-CM | POA: Diagnosis not present

## 2024-08-28 DIAGNOSIS — R2689 Other abnormalities of gait and mobility: Secondary | ICD-10-CM | POA: Diagnosis not present

## 2024-08-28 DIAGNOSIS — R41841 Cognitive communication deficit: Secondary | ICD-10-CM | POA: Diagnosis not present

## 2024-08-28 DIAGNOSIS — I69391 Dysphagia following cerebral infarction: Secondary | ICD-10-CM | POA: Diagnosis not present

## 2024-08-28 DIAGNOSIS — R1311 Dysphagia, oral phase: Secondary | ICD-10-CM | POA: Diagnosis not present

## 2024-08-29 DIAGNOSIS — I69391 Dysphagia following cerebral infarction: Secondary | ICD-10-CM | POA: Diagnosis not present

## 2024-08-29 DIAGNOSIS — I69318 Other symptoms and signs involving cognitive functions following cerebral infarction: Secondary | ICD-10-CM | POA: Diagnosis not present

## 2024-08-29 DIAGNOSIS — R41841 Cognitive communication deficit: Secondary | ICD-10-CM | POA: Diagnosis not present

## 2024-08-29 DIAGNOSIS — E1165 Type 2 diabetes mellitus with hyperglycemia: Secondary | ICD-10-CM | POA: Diagnosis not present

## 2024-08-29 DIAGNOSIS — R1311 Dysphagia, oral phase: Secondary | ICD-10-CM | POA: Diagnosis not present

## 2024-08-29 DIAGNOSIS — R2689 Other abnormalities of gait and mobility: Secondary | ICD-10-CM | POA: Diagnosis not present

## 2024-08-29 DIAGNOSIS — I69354 Hemiplegia and hemiparesis following cerebral infarction affecting left non-dominant side: Secondary | ICD-10-CM | POA: Diagnosis not present

## 2024-08-30 DIAGNOSIS — E1165 Type 2 diabetes mellitus with hyperglycemia: Secondary | ICD-10-CM | POA: Diagnosis not present

## 2024-08-30 DIAGNOSIS — I69318 Other symptoms and signs involving cognitive functions following cerebral infarction: Secondary | ICD-10-CM | POA: Diagnosis not present

## 2024-08-30 DIAGNOSIS — R2689 Other abnormalities of gait and mobility: Secondary | ICD-10-CM | POA: Diagnosis not present

## 2024-08-30 DIAGNOSIS — M6281 Muscle weakness (generalized): Secondary | ICD-10-CM | POA: Diagnosis not present

## 2024-08-30 DIAGNOSIS — R41841 Cognitive communication deficit: Secondary | ICD-10-CM | POA: Diagnosis not present

## 2024-08-30 DIAGNOSIS — I69354 Hemiplegia and hemiparesis following cerebral infarction affecting left non-dominant side: Secondary | ICD-10-CM | POA: Diagnosis not present

## 2024-08-30 DIAGNOSIS — I69391 Dysphagia following cerebral infarction: Secondary | ICD-10-CM | POA: Diagnosis not present

## 2024-08-30 DIAGNOSIS — R1311 Dysphagia, oral phase: Secondary | ICD-10-CM | POA: Diagnosis not present

## 2024-09-02 DIAGNOSIS — I480 Paroxysmal atrial fibrillation: Secondary | ICD-10-CM | POA: Diagnosis not present

## 2024-09-02 DIAGNOSIS — R2681 Unsteadiness on feet: Secondary | ICD-10-CM | POA: Diagnosis not present

## 2024-09-02 DIAGNOSIS — R2689 Other abnormalities of gait and mobility: Secondary | ICD-10-CM | POA: Diagnosis not present

## 2024-09-02 DIAGNOSIS — J45909 Unspecified asthma, uncomplicated: Secondary | ICD-10-CM | POA: Diagnosis not present

## 2024-09-02 DIAGNOSIS — I69354 Hemiplegia and hemiparesis following cerebral infarction affecting left non-dominant side: Secondary | ICD-10-CM | POA: Diagnosis not present

## 2024-09-02 DIAGNOSIS — M6281 Muscle weakness (generalized): Secondary | ICD-10-CM | POA: Diagnosis not present

## 2024-09-02 DIAGNOSIS — J449 Chronic obstructive pulmonary disease, unspecified: Secondary | ICD-10-CM | POA: Diagnosis not present

## 2024-09-02 DIAGNOSIS — G40919 Epilepsy, unspecified, intractable, without status epilepticus: Secondary | ICD-10-CM | POA: Diagnosis not present

## 2024-09-03 DIAGNOSIS — F329 Major depressive disorder, single episode, unspecified: Secondary | ICD-10-CM | POA: Diagnosis not present

## 2024-09-03 DIAGNOSIS — R443 Hallucinations, unspecified: Secondary | ICD-10-CM | POA: Diagnosis not present

## 2024-10-03 DIAGNOSIS — E119 Type 2 diabetes mellitus without complications: Secondary | ICD-10-CM | POA: Diagnosis not present

## 2024-10-03 DIAGNOSIS — G40909 Epilepsy, unspecified, not intractable, without status epilepticus: Secondary | ICD-10-CM | POA: Diagnosis not present

## 2024-10-03 DIAGNOSIS — E782 Mixed hyperlipidemia: Secondary | ICD-10-CM | POA: Diagnosis not present

## 2024-10-03 DIAGNOSIS — I4891 Unspecified atrial fibrillation: Secondary | ICD-10-CM | POA: Diagnosis not present

## 2024-10-03 DIAGNOSIS — I509 Heart failure, unspecified: Secondary | ICD-10-CM | POA: Diagnosis not present

## 2024-11-12 ENCOUNTER — Telehealth: Payer: Self-pay | Admitting: Adult Health

## 2024-11-12 NOTE — Telephone Encounter (Signed)
 Can provide paperwork for handicap sticker which should only be used when accompanied by his mother.  In regards to a ramp, I am not sure if this is something that insurance will cover. He should reach out to his insurance company and if they do cover this, they can direct him on where to obtain and we can send an order there.

## 2024-11-12 NOTE — Telephone Encounter (Signed)
 Is this something that you would feel comfortable signing for?

## 2024-11-12 NOTE — Telephone Encounter (Signed)
 Pt son Myrle called to request to speak to  MD about getting Pt a handicap sticker and a ramp . Pt son stated he wasn't sure who to speak to , So he wanted to  start with  Neurologist office .  Pt son stated if he doesn't answer to LVM  and he   will call back .

## 2024-11-12 NOTE — Telephone Encounter (Signed)
 Called patient and left a message for him to return the call to inform him.

## 2024-11-13 NOTE — Telephone Encounter (Signed)
 Called patient's son and left message for him to return call.

## 2024-11-13 NOTE — Telephone Encounter (Signed)
 Called and spoke to patients son, he stated that he was needing a letter for her apartment complex so that she can get into a handicap apartment if available. Patient's son stated that his mom is currently living in an apartment with steps and that she wouldn't be able to manage the steps being in a wheelchair.  Is this something that you could write?  Patient's son also stated that he was wanting the handicap sticker and the ramp as well, but wanted the paper in addition.

## 2024-11-13 NOTE — Telephone Encounter (Signed)
 Yes, can provide letter. Again, he will need to speak to insurance company regarding coverage for a ramp. I have not wrote for this previously and unsure if insurance covers these.

## 2024-11-14 NOTE — Telephone Encounter (Addendum)
 Left message for son to return call to discuss ramp, and requested paperwork.

## 2024-11-18 NOTE — Telephone Encounter (Signed)
 Patients son stated he was coming to collect forms tomorrow (11/19/2024) and forms were placed in the front.

## 2024-11-18 NOTE — Telephone Encounter (Signed)
 Called patients son (on [place]) to discuss requested paperwork as well as ramp, left a message for him to return my call.

## 2025-02-10 ENCOUNTER — Ambulatory Visit: Admitting: Neurology
